# Patient Record
Sex: Female | Born: 1955
Health system: Southern US, Community
[De-identification: ages and names within clinical notes are randomized; demographics above are authoritative.]

## PROBLEM LIST (undated history)

## (undated) DIAGNOSIS — M8580 Other specified disorders of bone density and structure, unspecified site: Secondary | ICD-10-CM

## (undated) DIAGNOSIS — E119 Type 2 diabetes mellitus without complications: Secondary | ICD-10-CM

## (undated) DIAGNOSIS — E079 Disorder of thyroid, unspecified: Secondary | ICD-10-CM

## (undated) DIAGNOSIS — K219 Gastro-esophageal reflux disease without esophagitis: Secondary | ICD-10-CM

## (undated) DIAGNOSIS — I10 Essential (primary) hypertension: Secondary | ICD-10-CM

## (undated) DIAGNOSIS — T7840XA Allergy, unspecified, initial encounter: Secondary | ICD-10-CM

## (undated) DIAGNOSIS — Z860100 Personal history of colon polyps, unspecified: Secondary | ICD-10-CM

## (undated) DIAGNOSIS — D649 Anemia, unspecified: Secondary | ICD-10-CM

## (undated) DIAGNOSIS — M1990 Unspecified osteoarthritis, unspecified site: Secondary | ICD-10-CM

## (undated) DIAGNOSIS — E785 Hyperlipidemia, unspecified: Secondary | ICD-10-CM

## (undated) DIAGNOSIS — G709 Myoneural disorder, unspecified: Secondary | ICD-10-CM

## (undated) DIAGNOSIS — U071 COVID-19: Secondary | ICD-10-CM

## (undated) DIAGNOSIS — H269 Unspecified cataract: Secondary | ICD-10-CM

## (undated) HISTORY — DX: Hyperlipidemia, unspecified: E78.5

## (undated) HISTORY — DX: Essential (primary) hypertension: I10

## (undated) HISTORY — DX: Anemia, unspecified: D64.9

## (undated) HISTORY — DX: Unspecified cataract: H26.9

## (undated) HISTORY — DX: Myoneural disorder, unspecified: G70.9

## (undated) HISTORY — PX: APPENDECTOMY: SHX54

## (undated) HISTORY — DX: Type 2 diabetes mellitus without complications: E11.9

## (undated) HISTORY — PX: TONSILLECTOMY: SUR1361

## (undated) HISTORY — DX: Morbid (severe) obesity due to excess calories: E66.01

## (undated) HISTORY — DX: Personal history of colon polyps, unspecified: Z86.0100

## (undated) HISTORY — DX: Disorder of thyroid, unspecified: E07.9

## (undated) HISTORY — DX: COVID-19: U07.1

## (undated) HISTORY — PX: ABDOMINAL HYSTERECTOMY: SHX81

## (undated) HISTORY — DX: Allergy, unspecified, initial encounter: T78.40XA

## (undated) HISTORY — DX: Gastro-esophageal reflux disease without esophagitis: K21.9

## (undated) HISTORY — DX: Unspecified osteoarthritis, unspecified site: M19.90

## (undated) HISTORY — PX: CHOLECYSTECTOMY: SHX55

## (undated) HISTORY — DX: Other specified disorders of bone density and structure, unspecified site: M85.80

## (undated) HISTORY — DX: Personal history of colonic polyps: Z86.010

---

## 1997-11-01 ENCOUNTER — Emergency Department (HOSPITAL_COMMUNITY): Admission: EM | Admit: 1997-11-01 | Discharge: 1997-11-01 | Payer: Self-pay | Admitting: Emergency Medicine

## 2004-02-05 ENCOUNTER — Ambulatory Visit: Payer: Self-pay | Admitting: Family Medicine

## 2004-02-26 ENCOUNTER — Ambulatory Visit (HOSPITAL_COMMUNITY): Admission: RE | Admit: 2004-02-26 | Discharge: 2004-02-26 | Payer: Self-pay | Admitting: Otolaryngology

## 2004-10-06 ENCOUNTER — Ambulatory Visit: Payer: Self-pay | Admitting: Family Medicine

## 2004-10-13 ENCOUNTER — Ambulatory Visit (HOSPITAL_COMMUNITY): Admission: RE | Admit: 2004-10-13 | Discharge: 2004-10-13 | Payer: Self-pay | Admitting: Family Medicine

## 2004-10-20 ENCOUNTER — Ambulatory Visit: Payer: Self-pay | Admitting: *Deleted

## 2004-10-29 ENCOUNTER — Ambulatory Visit: Payer: Self-pay | Admitting: Family Medicine

## 2004-10-29 ENCOUNTER — Encounter (HOSPITAL_COMMUNITY): Admission: RE | Admit: 2004-10-29 | Discharge: 2004-11-28 | Payer: Self-pay | Admitting: *Deleted

## 2004-10-29 ENCOUNTER — Ambulatory Visit: Payer: Self-pay | Admitting: *Deleted

## 2004-11-10 ENCOUNTER — Ambulatory Visit: Payer: Self-pay | Admitting: *Deleted

## 2004-11-16 ENCOUNTER — Encounter (HOSPITAL_COMMUNITY): Admission: RE | Admit: 2004-11-16 | Discharge: 2004-12-16 | Payer: Self-pay | Admitting: Internal Medicine

## 2004-12-20 ENCOUNTER — Emergency Department (HOSPITAL_COMMUNITY): Admission: EM | Admit: 2004-12-20 | Discharge: 2004-12-20 | Payer: Self-pay | Admitting: Emergency Medicine

## 2005-03-02 ENCOUNTER — Ambulatory Visit: Payer: Self-pay | Admitting: Family Medicine

## 2005-03-15 ENCOUNTER — Ambulatory Visit: Payer: Self-pay | Admitting: Family Medicine

## 2005-05-26 ENCOUNTER — Encounter: Payer: Self-pay | Admitting: Emergency Medicine

## 2005-08-31 ENCOUNTER — Ambulatory Visit: Payer: Self-pay | Admitting: Family Medicine

## 2005-09-10 ENCOUNTER — Ambulatory Visit (HOSPITAL_COMMUNITY): Admission: RE | Admit: 2005-09-10 | Discharge: 2005-09-10 | Payer: Self-pay | Admitting: Family Medicine

## 2005-10-04 ENCOUNTER — Encounter: Admission: RE | Admit: 2005-10-04 | Discharge: 2005-10-04 | Payer: Self-pay | Admitting: Family Medicine

## 2005-11-15 ENCOUNTER — Encounter: Admission: RE | Admit: 2005-11-15 | Discharge: 2005-11-15 | Payer: Self-pay | Admitting: Family Medicine

## 2006-01-07 ENCOUNTER — Ambulatory Visit: Payer: Self-pay | Admitting: Family Medicine

## 2006-02-24 ENCOUNTER — Ambulatory Visit: Payer: Self-pay | Admitting: Family Medicine

## 2006-02-25 ENCOUNTER — Encounter: Payer: Self-pay | Admitting: Family Medicine

## 2006-02-25 LAB — CONVERTED CEMR LAB
Bilirubin Urine: NEGATIVE
Ketones, ur: NEGATIVE mg/dL
Nitrite: NEGATIVE
Specific Gravity, Urine: 1.011 (ref 1.005–1.03)
Urobilinogen, UA: 1 (ref 0.0–1.0)
pH: 6 (ref 5.0–8.0)

## 2006-04-25 ENCOUNTER — Ambulatory Visit: Payer: Self-pay | Admitting: Family Medicine

## 2006-06-21 ENCOUNTER — Encounter: Payer: Self-pay | Admitting: Family Medicine

## 2006-06-21 ENCOUNTER — Ambulatory Visit: Payer: Self-pay | Admitting: Family Medicine

## 2006-06-21 ENCOUNTER — Other Ambulatory Visit: Admission: RE | Admit: 2006-06-21 | Discharge: 2006-06-21 | Payer: Self-pay | Admitting: Family Medicine

## 2006-06-21 ENCOUNTER — Encounter (INDEPENDENT_AMBULATORY_CARE_PROVIDER_SITE_OTHER): Payer: Self-pay | Admitting: *Deleted

## 2006-06-21 LAB — CONVERTED CEMR LAB: Pap Smear: NORMAL

## 2006-08-29 ENCOUNTER — Ambulatory Visit: Payer: Self-pay | Admitting: Family Medicine

## 2006-12-16 ENCOUNTER — Ambulatory Visit: Payer: Self-pay | Admitting: Family Medicine

## 2007-01-26 ENCOUNTER — Encounter: Payer: Self-pay | Admitting: Family Medicine

## 2007-01-26 HISTORY — PX: COLONOSCOPY: SHX174

## 2007-01-27 ENCOUNTER — Encounter: Payer: Self-pay | Admitting: Family Medicine

## 2007-06-06 ENCOUNTER — Ambulatory Visit: Payer: Self-pay | Admitting: Family Medicine

## 2007-06-08 ENCOUNTER — Encounter (INDEPENDENT_AMBULATORY_CARE_PROVIDER_SITE_OTHER): Payer: Self-pay | Admitting: *Deleted

## 2007-06-08 DIAGNOSIS — M5410 Radiculopathy, site unspecified: Secondary | ICD-10-CM | POA: Insufficient documentation

## 2007-06-08 DIAGNOSIS — I10 Essential (primary) hypertension: Secondary | ICD-10-CM | POA: Insufficient documentation

## 2007-06-08 DIAGNOSIS — E785 Hyperlipidemia, unspecified: Secondary | ICD-10-CM | POA: Insufficient documentation

## 2007-06-08 DIAGNOSIS — E039 Hypothyroidism, unspecified: Secondary | ICD-10-CM | POA: Insufficient documentation

## 2007-06-08 DIAGNOSIS — F418 Other specified anxiety disorders: Secondary | ICD-10-CM | POA: Insufficient documentation

## 2007-06-08 DIAGNOSIS — K219 Gastro-esophageal reflux disease without esophagitis: Secondary | ICD-10-CM | POA: Insufficient documentation

## 2007-06-08 DIAGNOSIS — E739 Lactose intolerance, unspecified: Secondary | ICD-10-CM | POA: Insufficient documentation

## 2007-06-08 DIAGNOSIS — F411 Generalized anxiety disorder: Secondary | ICD-10-CM | POA: Insufficient documentation

## 2007-06-14 ENCOUNTER — Ambulatory Visit (HOSPITAL_COMMUNITY): Admission: RE | Admit: 2007-06-14 | Discharge: 2007-06-14 | Payer: Self-pay | Admitting: Family Medicine

## 2007-10-03 ENCOUNTER — Telehealth: Payer: Self-pay | Admitting: Family Medicine

## 2007-10-06 ENCOUNTER — Encounter: Payer: Self-pay | Admitting: Family Medicine

## 2007-10-10 ENCOUNTER — Encounter: Payer: Self-pay | Admitting: Family Medicine

## 2007-10-20 ENCOUNTER — Encounter: Payer: Self-pay | Admitting: Family Medicine

## 2007-10-23 ENCOUNTER — Encounter: Payer: Self-pay | Admitting: Family Medicine

## 2007-11-08 ENCOUNTER — Encounter: Payer: Self-pay | Admitting: Family Medicine

## 2007-11-30 ENCOUNTER — Telehealth: Payer: Self-pay | Admitting: Family Medicine

## 2007-12-05 ENCOUNTER — Ambulatory Visit: Payer: Self-pay | Admitting: Family Medicine

## 2007-12-13 ENCOUNTER — Telehealth: Payer: Self-pay | Admitting: Family Medicine

## 2007-12-14 ENCOUNTER — Encounter: Payer: Self-pay | Admitting: Family Medicine

## 2007-12-18 ENCOUNTER — Ambulatory Visit (HOSPITAL_COMMUNITY): Admission: RE | Admit: 2007-12-18 | Discharge: 2007-12-18 | Payer: Self-pay | Admitting: Internal Medicine

## 2007-12-18 ENCOUNTER — Ambulatory Visit: Payer: Self-pay | Admitting: Internal Medicine

## 2007-12-18 ENCOUNTER — Encounter: Payer: Self-pay | Admitting: Family Medicine

## 2007-12-29 ENCOUNTER — Encounter: Payer: Self-pay | Admitting: Family Medicine

## 2008-01-04 ENCOUNTER — Ambulatory Visit: Payer: Self-pay | Admitting: Family Medicine

## 2008-01-04 DIAGNOSIS — R51 Headache: Secondary | ICD-10-CM | POA: Insufficient documentation

## 2008-01-04 DIAGNOSIS — R519 Headache, unspecified: Secondary | ICD-10-CM | POA: Insufficient documentation

## 2008-01-22 ENCOUNTER — Encounter: Payer: Self-pay | Admitting: Family Medicine

## 2008-02-09 ENCOUNTER — Encounter: Payer: Self-pay | Admitting: Family Medicine

## 2008-03-11 ENCOUNTER — Encounter: Payer: Self-pay | Admitting: Family Medicine

## 2008-04-02 ENCOUNTER — Ambulatory Visit: Payer: Self-pay | Admitting: Family Medicine

## 2008-04-02 LAB — CONVERTED CEMR LAB: Glucose, Bld: 109 mg/dL

## 2008-05-08 ENCOUNTER — Encounter: Payer: Self-pay | Admitting: Family Medicine

## 2008-08-07 ENCOUNTER — Ambulatory Visit: Payer: Self-pay | Admitting: Family Medicine

## 2008-08-19 ENCOUNTER — Ambulatory Visit (HOSPITAL_COMMUNITY): Admission: RE | Admit: 2008-08-19 | Discharge: 2008-08-19 | Payer: Self-pay | Admitting: Family Medicine

## 2008-12-03 ENCOUNTER — Ambulatory Visit: Payer: Self-pay | Admitting: Family Medicine

## 2008-12-03 ENCOUNTER — Encounter: Payer: Self-pay | Admitting: Family Medicine

## 2008-12-03 ENCOUNTER — Other Ambulatory Visit: Admission: RE | Admit: 2008-12-03 | Discharge: 2008-12-03 | Payer: Self-pay | Admitting: Family Medicine

## 2008-12-03 LAB — CONVERTED CEMR LAB: Glucose, Bld: 128 mg/dL

## 2008-12-04 ENCOUNTER — Encounter: Payer: Self-pay | Admitting: Family Medicine

## 2008-12-05 ENCOUNTER — Encounter: Payer: Self-pay | Admitting: Family Medicine

## 2009-01-25 DIAGNOSIS — E119 Type 2 diabetes mellitus without complications: Secondary | ICD-10-CM

## 2009-01-25 DIAGNOSIS — I10 Essential (primary) hypertension: Secondary | ICD-10-CM

## 2009-01-25 HISTORY — DX: Type 2 diabetes mellitus without complications: E11.9

## 2009-01-25 HISTORY — DX: Essential (primary) hypertension: I10

## 2009-01-28 ENCOUNTER — Ambulatory Visit: Payer: Self-pay | Admitting: Family Medicine

## 2009-02-14 ENCOUNTER — Encounter: Payer: Self-pay | Admitting: Family Medicine

## 2009-04-08 ENCOUNTER — Encounter: Payer: Self-pay | Admitting: Family Medicine

## 2009-06-10 ENCOUNTER — Encounter: Payer: Self-pay | Admitting: Family Medicine

## 2009-07-04 ENCOUNTER — Encounter: Payer: Self-pay | Admitting: Family Medicine

## 2009-07-29 ENCOUNTER — Ambulatory Visit: Payer: Self-pay | Admitting: Family Medicine

## 2009-08-18 ENCOUNTER — Ambulatory Visit: Payer: Self-pay | Admitting: Family Medicine

## 2009-08-18 DIAGNOSIS — L299 Pruritus, unspecified: Secondary | ICD-10-CM | POA: Insufficient documentation

## 2009-08-20 ENCOUNTER — Encounter: Payer: Self-pay | Admitting: Physician Assistant

## 2009-08-20 ENCOUNTER — Telehealth: Payer: Self-pay | Admitting: Physician Assistant

## 2009-09-16 ENCOUNTER — Telehealth: Payer: Self-pay | Admitting: Family Medicine

## 2009-09-17 ENCOUNTER — Encounter: Payer: Self-pay | Admitting: Family Medicine

## 2009-09-18 ENCOUNTER — Ambulatory Visit (HOSPITAL_COMMUNITY): Admission: RE | Admit: 2009-09-18 | Discharge: 2009-09-18 | Payer: Self-pay | Admitting: Family Medicine

## 2009-09-23 LAB — CONVERTED CEMR LAB
ALT: 29 units/L (ref 0–35)
AST: 25 units/L (ref 0–37)
Albumin: 4.2 g/dL (ref 3.5–5.2)
Basophils Absolute: 0.1 10*3/uL (ref 0.0–0.1)
Basophils Relative: 1 % (ref 0–1)
Bilirubin, Direct: 0.1 mg/dL (ref 0.0–0.3)
Calcium: 9.5 mg/dL (ref 8.4–10.5)
Cholesterol: 254 mg/dL — ABNORMAL HIGH (ref 0–200)
Eosinophils Absolute: 0.3 10*3/uL (ref 0.0–0.7)
Eosinophils Relative: 3 % (ref 0–5)
HCT: 44.5 % (ref 36.0–46.0)
HDL: 41 mg/dL (ref >39)
MCV: 94.7 fL (ref 78.0–100.0)
Neutrophils Relative %: 53 % (ref 43–77)
Platelets: 418 10*3/uL — ABNORMAL HIGH (ref 150–400)
Potassium: 4.6 meq/L (ref 3.5–5.3)
RDW: 14.8 % (ref 11.5–15.5)
Sodium: 137 meq/L (ref 135–145)
TSH: 5.203 microintl units/mL — ABNORMAL HIGH (ref 0.350–4.500)
Total CHOL/HDL Ratio: 6.2
Total Protein: 7 g/dL (ref 6.0–8.3)
Triglycerides: 175 mg/dL — ABNORMAL HIGH (ref <150)

## 2009-11-07 ENCOUNTER — Encounter: Payer: Self-pay | Admitting: Family Medicine

## 2009-12-12 ENCOUNTER — Encounter: Payer: Self-pay | Admitting: Family Medicine

## 2010-01-15 ENCOUNTER — Encounter: Payer: Self-pay | Admitting: Family Medicine

## 2010-01-16 LAB — CONVERTED CEMR LAB
ALT: 22 units/L (ref 0–35)
BUN: 14 mg/dL (ref 6–23)
Bilirubin, Direct: 0.1 mg/dL (ref 0.0–0.3)
CO2: 29 meq/L (ref 19–32)
Cholesterol: 249 mg/dL — ABNORMAL HIGH (ref 0–200)
Eosinophils Absolute: 0.4 10*3/uL (ref 0.0–0.7)
Eosinophils Relative: 5 % (ref 0–5)
Glucose, Bld: 124 mg/dL — ABNORMAL HIGH (ref 70–99)
Hemoglobin: 13.9 g/dL (ref 12.0–15.0)
Hgb A1c MFr Bld: 6.9 % — ABNORMAL HIGH (ref <5.7)
Indirect Bilirubin: 0.3 mg/dL (ref 0.0–0.9)
Lymphocytes Relative: 47 % — ABNORMAL HIGH (ref 12–46)
Lymphs Abs: 4.1 10*3/uL — ABNORMAL HIGH (ref 0.7–4.0)
MCHC: 32.2 g/dL (ref 30.0–36.0)
Monocytes Relative: 6 % (ref 3–12)
Neutro Abs: 3.6 10*3/uL (ref 1.7–7.7)
Platelets: 396 10*3/uL (ref 150–400)
Potassium: 4.9 meq/L (ref 3.5–5.3)
RDW: 13.7 % (ref 11.5–15.5)
Sodium: 140 meq/L (ref 135–145)
TSH: 4.408 microintl units/mL (ref 0.350–4.500)
Total Bilirubin: 0.4 mg/dL (ref 0.3–1.2)
Total CHOL/HDL Ratio: 5.2
VLDL: 35 mg/dL (ref 0–40)

## 2010-01-22 ENCOUNTER — Ambulatory Visit: Payer: Self-pay | Admitting: Family Medicine

## 2010-01-22 DIAGNOSIS — E1165 Type 2 diabetes mellitus with hyperglycemia: Secondary | ICD-10-CM | POA: Insufficient documentation

## 2010-01-22 DIAGNOSIS — E1040 Type 1 diabetes mellitus with diabetic neuropathy, unspecified: Secondary | ICD-10-CM

## 2010-02-14 ENCOUNTER — Encounter: Payer: Self-pay | Admitting: Family Medicine

## 2010-02-15 ENCOUNTER — Encounter: Payer: Self-pay | Admitting: Family Medicine

## 2010-02-22 LAB — CONVERTED CEMR LAB: Hgb A1c MFr Bld: 6.5 %

## 2010-02-24 NOTE — Letter (Signed)
Summary: misc  misc   Imported By: Lind Guest 06/19/2009 08:03:35  _____________________________________________________________________  External Attachment:    Type:   Image     Comment:   External Document

## 2010-02-24 NOTE — Letter (Signed)
Summary: rx assistance   rx assistance   Imported By: Lind Guest 12/12/2009 08:43:13  _____________________________________________________________________  External Attachment:    Type:   Image     Comment:   External Document

## 2010-02-24 NOTE — Letter (Signed)
Summary: rx assistance  rx assistance   Imported By: Lind Guest 02/14/2009 10:02:06  _____________________________________________________________________  External Attachment:    Type:   Image     Comment:   External Document

## 2010-02-24 NOTE — Letter (Signed)
Summary: consults  consults   Imported By: Lind Guest 06/19/2009 08:01:36  _____________________________________________________________________  External Attachment:    Type:   Image     Comment:   External Document

## 2010-02-24 NOTE — Letter (Signed)
Summary: PATIENT ASSISTANCE RX  PATIENT ASSISTANCE RX   Imported By: Lind Guest 06/10/2009 10:27:17  _____________________________________________________________________  External Attachment:    Type:   Image     Comment:   External Document

## 2010-02-24 NOTE — Letter (Signed)
Summary: x rays  x rays   Imported By: Lind Guest 06/19/2009 08:05:21  _____________________________________________________________________  External Attachment:    Type:   Image     Comment:   External Document

## 2010-02-24 NOTE — Progress Notes (Signed)
Summary: notes  Phone Note Call from Patient   Summary of Call: needs a doctors note to be out the whole week. pt saw Pola Furno on Monday. [phone] Initial call taken by: Rudene Anda,  August 20, 2009 8:06 AM  Follow-up for Phone Call        Correct ph # is [phone]. Pt feels that she is unable to work this week.  She is itching again this afternoon.  states the med helps but makes her drowsy.  Pt states she is hopeful the itching will be better next week. Follow-up by: Esperanza Sheets PA,  August 20, 2009 4:26 PM  Additional Follow-up for Phone Call Additional follow up Details #1::        Discussed with Dr Lodema Hong. OK work note for this week. Additional Follow-up by: Esperanza Sheets PA,  August 20, 2009 4:30 PM    Additional Follow-up for Phone Call Additional follow up Details #2::    patient aware Follow-up by: Adella Hare LPN,  August 20, 2009 4:45 PM

## 2010-02-24 NOTE — Assessment & Plan Note (Signed)
Summary: OV   Vital Signs:  Patient profile:   55 year old female Menstrual status:  hysterectomy Height:      67.5 inches Weight:      309.25 pounds BMI:     47.89 O2 Sat:      97 % Pulse rate:   80 / minute Pulse rhythm:   regular Resp:     16 per minute BP sitting:   114 / 80 Cuff size:   regular  Vitals Entered By: Everitt Amber (January 28, 2009 2:21 PM)  Nutrition Counseling: Patient's BMI is greater than 25 and therefore counseled on weight management options. CC: Follow up chronic problems, back and legs still hurting her. Got to where she couldn't walk the other night, her right leg kept locking up   Primary Care Provider:  Syliva Overman MD  CC:  Follow up chronic problems, back and legs still hurting her. Got to where she couldn't walk the other night, and her right leg kept locking up.  History of Present Illness: Larey Seat off ladder approx 6 weeks ago, experienced severe back pain, she had severe spasm 4 days ago, while in her house, could not move for 15 mins , she reports progressive weakness of the right leg and numbness, no incontinence noted. She feels as though her back problems are worsening, and really needs further evaluation, she is awaiting help with her medical care before she can move forward on this. She otherwise has no new complaints. She does however state that her depression is worsening because she is unable to afford the med and has received no medication to date.She is not suicidal or homicidal and is trying to find a job.  Allergies: 1)  ! * Anaprox  Review of Systems      See HPI ENT:  Denies hoarseness, nasal congestion, sinus pressure, and sore throat. Resp:  Denies cough and sputum productive. GU:  Denies dysuria, incontinence, urinary frequency, and urinary hesitancy. MS:  See HPI. Neuro:  Complains of numbness and weakness; progressive unilateral lower extremity numbness and weakness. Psych:  See HPI; Complains of anxiety and depression;  denies easily tearful, irritability, mental problems, panic attacks, suicidal thoughts/plans, thoughts of violence, and unusual visions or sounds.  Physical Exam  General:  Well-developed,obese,in no acute distress; alert,appropriate and cooperative throughout examination HEENT: No facial asymmetry,  EOMI, No sinus tenderness, TM's Clear, oropharynx  pink and moist.   Chest: Clear to auscultation bilaterally.  CVS: S1, S2, No murmurs, No S3.   Abd: Soft, Nontender.  ZO:[id number] reduced  ROM spine, with abnormal gait.  Ext: No edema.   CNS: CN 2-12 intact,reduced  power tone and sensation  in right lower extremity  Skin: Intact, no visible lesions or rashes.  Psych: Good eye contact, normal affect.  Memory intact, not anxious or depressed appearing.    Impression & Recommendations:  Problem # 1:  BACK PAIN (ICD-724.5) Assessment Deteriorated  Her updated medication list for this problem includes:    Aspirin 81 Mg Tbec (Aspirin) ..... One tab by mouth once daily    Hydrocodone-acetaminophen 5-500 Mg Tabs (Hydrocodone-acetaminophen) ..... One tab by mouth at bedtime as needed  Orders: Depo- Medrol 80mg  (J1040) Ketorolac-Toradol 15mg  (U9811) Admin of Therapeutic Inj  intramuscular or subcutaneous (91478)  Problem # 2:  DEPRESSION (ICD-311) Assessment: Deteriorated  Her updated medication list for this problem includes:    Alprazolam 0.25 Mg Tabs (Alprazolam) ..... One tab by mouth once daily as needed  Problem #  3:  OBESITY (ICD-278.00) Assessment: Unchanged  Ht: 67.5 (01/28/2009)   Wt: 309.25 (01/28/2009)   BMI: 47.89 (01/28/2009)  Problem # 4:  HYPERTENSION (ICD-401.9) Assessment: Unchanged  Her updated medication list for this problem includes:    Benazepril-hydrochlorothiazide 20-12.5 Mg Tabs (Benazepril-hydrochlorothiazide) ..... One tab by mouth once daily  BP today: 114/80 Prior BP: 120/82 (12/03/2008)  Problem # 5:  HYPOTHYROIDISM (ICD-244.9) Assessment:  Comment Only  Her updated medication list for this problem includes:    Synthroid 150 Mcg Tabs (Levothyroxine sodium) ..... One tab po qd  Labs Reviewed: HgBA1c: 6.2 (04/02/2008)  Problem # 6:  HYPERLIPIDEMIA (ICD-272.4)  Complete Medication List: 1)  Benazepril-hydrochlorothiazide 20-12.5 Mg Tabs (Benazepril-hydrochlorothiazide) .... One tab by mouth once daily 2)  Klor-con M20 20 Meq Tbcr (Potassium chloride crys cr) .... One tab by mouth once daily 3)  Aspirin 81 Mg Tbec (Aspirin) .... One tab by mouth once daily 4)  Prevacid 30 Mg Cpdr (Lansoprazole) .... One tab by mouth once daily 5)  Alprazolam 0.25 Mg Tabs (Alprazolam) .... One tab by mouth once daily as needed 6)  Hydrocodone-acetaminophen 5-500 Mg Tabs (Hydrocodone-acetaminophen) .... One tab by mouth at bedtime as needed 7)  Synthroid 150 Mcg Tabs (Levothyroxine sodium) .... One tab po qd 8)  Oscal 500/200 D-3 500-200 Mg-unit Tabs (Calcium-vitamin d) .... Take 1 tablet by mouth three times a day  Patient Instructions: 1)  F/U as before. 2)  I believe that your back problem is much worse. 3)  You need an mRI of the low back since you are experiencing weakness and numbness of the right leg, pls try and get this asap. You report near falls because of right leg weakness. 4)  Injections today and meds to your pharmacy. Prescriptions: PREDNISONE (PAK) 10 MG TABS (PREDNISONE) Use as directed  #21 x 0   Entered and Authorized by:   Syliva Overman MD   Signed by:   Syliva Overman MD on 01/28/2009   Method used:   Electronically to        [facility] [address] (retail)       [address]       [place], [place]  [address]       Ph: [phone] or [phone]       Fax: [phone]   RxID:   [id number]    Medication Administration  Injection # 1:    Medication: Depo- Medrol 80mg     Diagnosis: BACK PAIN (ICD-724.5)    Route: IM    Site: RUOQ gluteus    Exp Date: 8/11    Lot #:  [id number]    Mfr: novaplus    Patient tolerated injection without complications    Given by: Worthy Keeler LPN (January 28, 2009 3:27 PM)  Injection # 2:    Medication: Ketorolac-Toradol 15mg     Diagnosis: BACK PAIN (ICD-724.5)    Route: IM    Site: LUOQ gluteus    Exp Date: 05/26/2010    Lot #: [id number]    Mfr: novaplus    Comments: toradol 60mg  given from two 30mg  vials    Patient tolerated injection without complications    Given by: Worthy Keeler LPN (January 28, 2009 3:28 PM)  Orders Added: 1)  Est. Patient Level IV [44034] 2)  Depo- Medrol 80mg  [J1040] 3)  Ketorolac-Toradol 15mg  [J1885] 4)  Admin of Therapeutic Inj  intramuscular or subcutaneous [74259]

## 2010-02-24 NOTE — Letter (Signed)
Summary: PATIENT ASSISTANCE RX  PATIENT ASSISTANCE RX   Imported By: Lind Guest 06/10/2009 09:44:01  _____________________________________________________________________  External Attachment:    Type:   Image     Comment:   External Document

## 2010-02-24 NOTE — Letter (Signed)
Summary: rx assistance  rx assistance   Imported By: Lind Guest 02/14/2009 10:00:03  _____________________________________________________________________  External Attachment:    Type:   Image     Comment:   External Document

## 2010-02-24 NOTE — Letter (Signed)
Summary: RX ASSISTANCE  RX ASSISTANCE   Imported By: Lind Guest 11/07/2009 08:53:29  _____________________________________________________________________  External Attachment:    Type:   Image     Comment:   External Document

## 2010-02-24 NOTE — Assessment & Plan Note (Signed)
Summary: office visit   Vital Signs:  Patient profile:   55 year old female Menstrual status:  hysterectomy Height:      67.5 inches Weight:      309.08 pounds BMI:     47.87 Pulse rate:   72 / minute Pulse rhythm:   regular BP sitting:   130 / 84  (right arm)  Nutrition Counseling: Patient's BMI is greater than 25 and therefore counseled on weight management options.  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 she has not had any anti depressant meds for months, and states nw she is off to work she does not feel she absolutely needs them.  Denies recent fever or chills. Denies sinus pressure, nasal congestion , ear pain or sore throat. Denies chest congestion, or cough productive of sputum. Denies chest pain, palpitations, PND, orthopnea or leg swelling. Denies abdominal pain, nausea, vomitting, diarrhea or constipation. Denies change in bowel movements or bloody stool. Denies dysuria , frequency, incontinence or hesitancy. Pt continues to have chronic back pain Denies headaches, vertigo, seizures.  Denies  rash, lesions, or itch.     Allergies: 1)  ! * Anaprox  Review of Systems      See HPI Eyes:  Denies blurring and discharge. MS:  Complains of low back pain and mid back pain. Psych:  Complains of anxiety, depression, irritability, mental problems, suicidal thoughts/plans, and thoughts of violence; denies unusual visions or sounds; mmarked deterioration in her mental health in the last 6 months, primarily related to having no job or money, the great news is that tomorrow she starts  work,. intolerant of prozac, doubts she will have a need for meds now. Endo:  Denies  cold intolerance, excessive hunger, excessive thirst, excessive urination, heat intolerance, polyuria, and weight change. Heme:  Denies abnormal bruising and bleeding. Allergy:  Complains of seasonal allergies; denies hives or rash and itching eyes.  Physical Exam  General:  Well-developed,obese,in no acute distress; alert,appropriate and cooperative throughout examination HEENT: No facial asymmetry,  EOMI, No sinus tenderness, TM's Clear, oropharynx  pink and moist.   Chest: Clear to auscultation bilaterally.  CVS: S1, S2, No murmurs, No S3.   Abd: Soft, Nontender.  OZ:HYQMVHQI reduced  ROM spine, with abnormal gait.  Ext: No edema.   CNS: CN 2-12 intact,reduced  power tone and sensation  in right lower extremity  Skin: Intact, no visible lesions or rashes.  Psych: Good eye contact, normal affect.  Memory intact, not anxious or depressed appearing.    Impression & Recommendations:  Problem # 1:  BACK PAIN (ICD-724.5) Assessment Improved  The following medications were removed from the medication list:    Hydrocodone-acetaminophen 5-500 Mg Tabs (Hydrocodone-acetaminophen) ..... One tab by mouth at bedtime as needed Her updated medication list for this problem includes:    Aspirin 81 Mg Tbec (Aspirin) ..... One tab by mouth once daily  Problem # 2:  DEPRESSION (ICD-311) Assessment: Deteriorated  Her updated medication list for this problem includes:    Alprazolam 0.25 Mg Tabs (Alprazolam) ..... One tab by mouth once daily as needed  Discussed treatment options, including trial of antidpressant medication. Will refer to  behavioral health. Follow-up call in in 24-48 hours and recheck in 2 weeks, sooner as needed. Patient agrees to call if any worsening of symptoms or thoughts of doing harm arise. Verified that the patient has no suicidal ideation at this time.   Problem # 3:  HYPERLIPIDEMIA (ICD-272.4) Assessment: Comment Only  Orders: T-Lipid Profile ([id number]) T-Hepatic  Function [phone]) labs past due , low fat diet discussed and encouraged  Problem # 4:  HYPOTHYROIDISM (ICD-244.9) Assessment: Comment Only  Her updated medication list for this problem includes:    Synthroid 150 Mcg Tabs (Levothyroxine sodium) ..... One tab po qd  Orders: T-TSH ([id number])  Labs Reviewed: HgBA1c: 6.2 (04/02/2008)  Problem # 5:  HYPERTENSION (ICD-401.9) Assessment: Unchanged  Her updated medication list for this problem includes:    Benazepril-hydrochlorothiazide 20-12.5 Mg Tabs (Benazepril-hydrochlorothiazide) ..... One tab by mouth once daily  Orders: T-Basic Metabolic Panel [id number])  BP today: 130/84 Prior BP: 114/80 (01/28/2009)  Complete Medication List: 1)  Benazepril-hydrochlorothiazide 20-12.5 Mg Tabs (Benazepril-hydrochlorothiazide) .... One tab by mouth once daily 2)  Klor-con M20 20 Meq Tbcr (Potassium chloride crys cr) .... One tab by mouth once daily 3)  Aspirin 81 Mg Tbec (Aspirin) .... One tab by mouth once daily 4)  Alprazolam 0.25 Mg Tabs (Alprazolam) .... One tab by mouth once daily as needed 5)  Synthroid 150 Mcg Tabs (Levothyroxine sodium) .... One tab po qd 6)  Oscal 500/200 D-3 500-200 Mg-unit Tabs (Calcium-vitamin d) .... Take 1 tablet by mouth three times a day 7)  Dexilant 30 Mg Cpdr (Dexlansoprazole) .... One cap by mouth qd  Other Orders: T-CBC w/Diff ([id number]) T- Hemoglobin A1C ([id number])  Patient Instructions: 1)  F/U in 5.5 months 2)  BMP prior to visit, ICD-9: 3)  Lipid Panel prior to visit, ICD-9: 4)  TSH prior to visit, ICD-9:   fasting asap 5)  CBC w/ Diff prior to visit, ICD-9: 6)  HbgA1C prior to visit, ICD-9: 7)  It is important that you exercise regularly at least 20 minutes 5 times a week. If you develop chest pain, have severe difficulty breathing, or feel very tired , stop exercising immediately and seek medical attention. 8)  You need to lose weight. Consider a lower calorie diet and regular  exercise.  9)  CONGRATS on your job and all the best. 10)  [place] due end July, 2011, pls sched 11)  No med changes at this time

## 2010-02-24 NOTE — Letter (Signed)
Summary: rx assistance  rx assistance   Imported By: Lind Guest 02/14/2009 09:58:52  _____________________________________________________________________  External Attachment:    Type:   Image     Comment:   External Document

## 2010-02-24 NOTE — Letter (Signed)
Summary: labs  labs   Imported By: Lind Guest 06/19/2009 08:03:03  _____________________________________________________________________  External Attachment:    Type:   Image     Comment:   External Document

## 2010-02-24 NOTE — Letter (Signed)
Summary: office notes  office notes   Imported By: Lind Guest 06/19/2009 08:04:11  _____________________________________________________________________  External Attachment:    Type:   Image     Comment:   External Document

## 2010-02-24 NOTE — Letter (Signed)
Summary: rx assistance  rx assistance   Imported By: Lind Guest 02/14/2009 09:59:13  _____________________________________________________________________  External Attachment:    Type:   Image     Comment:   External Document

## 2010-02-24 NOTE — Letter (Signed)
Summary: phone notes  phone notes   Imported By: Lind Guest 06/19/2009 08:04:48  _____________________________________________________________________  External Attachment:    Type:   Image     Comment:   External Document

## 2010-02-24 NOTE — Letter (Signed)
Summary: patient assistance rx  patient assistance rx   Imported By: Lind Guest 07/04/2009 14:32:49  _____________________________________________________________________  External Attachment:    Type:   Image     Comment:   External Document

## 2010-02-24 NOTE — Progress Notes (Signed)
Summary: ORDER  Phone Note Call from Patient   Summary of Call: NEEDS A ORDER TO GO DO HER MAMMOGRAM GOING THURSDAY 8.25.11 Initial call taken by: Lind Guest,  September 16, 2009 3:59 PM  Follow-up for Phone Call        OK to order screening mammogram. Follow-up by: Esperanza Sheets PA,  September 16, 2009 4:22 PM  Additional Follow-up for Phone Call Additional follow up Details #1::        i cannot order this test Additional Follow-up by: Adella Hare LPN,  September 16, 2009 5:02 PM    Additional Follow-up for Phone Call Additional follow up Details #2::    sorry, thought you could. I have ordered it. Follow-up by: Esperanza Sheets PA,  September 17, 2009 8:11 AM  Additional Follow-up for Phone Call Additional follow up Details #3:: Details for Additional Follow-up Action Taken: order sent to referals Additional Follow-up by: Adella Hare LPN,  September 17, 2009 10:02 AM

## 2010-02-24 NOTE — Letter (Signed)
Summary: rx assistance  rx assistance   Imported By: Lind Guest 02/14/2009 09:59:38  _____________________________________________________________________  External Attachment:    Type:   Image     Comment:   External Document

## 2010-02-24 NOTE — Letter (Signed)
Summary: demo  demo   Imported By: Lind Guest 06/19/2009 08:02:03  _____________________________________________________________________  External Attachment:    Type:   Image     Comment:   External Document

## 2010-02-24 NOTE — Letter (Signed)
Summary: PATIENT ASSISTANCE RX  PATIENT ASSISTANCE RX   Imported By: Lind Guest 04/08/2009 10:04:40  _____________________________________________________________________  External Attachment:    Type:   Image     Comment:   External Document

## 2010-02-24 NOTE — Letter (Signed)
Summary: history and physical  history and physical   Imported By: Lind Guest 06/19/2009 08:02:35  _____________________________________________________________________  External Attachment:    Type:   Image     Comment:   External Document

## 2010-02-24 NOTE — Assessment & Plan Note (Signed)
Summary: nerves/itching- room 2   Vital Signs:  Patient profile:   55 year old female Menstrual status:  hysterectomy Height:      67.5 inches Weight:      311.25 pounds BMI:     48.20 O2 Sat:      94 % on Room air Pulse rate:   95 / minute Resp:     16 per minute BP sitting:   108 / 80  (left arm)  Vitals Entered By: Adella Hare LPN (August 18, 2009 4:34 PM) CC: nerves- itching Is Patient Diabetic? No Pain Assessment Patient in pain? no      Comments did not bring meds to ov   Primary Provider:  Syliva Overman MD  CC:  nerves- itching.  History of Present Illness: Pt presents today with c/o anxiety causing her to itch all over.  No rash.  No change soaps, laundry detergents, etc.  No new meds or recent abx. She states she has a hx of this.  Has been prescribed med in past that helped.  She states she just started a new job & this is what has her anxiety and itching flared up.  She denies any allergy syptoms such as sneezing, itchy watery eyes, or hives.  Pt was just seen for f/u of her chronic medical problems this mos.  No change in meds, etc.  Allergies (verified): 1)  ! * Anaprox  Past History:  Past medical history reviewed for relevance to current acute and chronic problems.  Past Medical History: Reviewed history from 06/08/2007 and no changes required. Current Problems:  GLUCOSE INTOLERANCE (ICD-271.3) BACK PAIN (ICD-724.5) GERD (ICD-530.81) ANXIETY (ICD-300.00) DEPRESSION (ICD-311) OBESITY (ICD-278.00) HYPOTHYROIDISM (ICD-244.9) HYPERLIPIDEMIA (ICD-272.4) HYPERTENSION (ICD-401.9)  Review of Systems General:  Denies chills and fever. ENT:  Denies earache, nasal congestion, and sore throat. CV:  Denies chest pain or discomfort and palpitations. Resp:  Denies shortness of breath and wheezing. Derm:  Complains of itching; denies lesion(s) and rash. Psych:  Complains of anxiety and depression; denies suicidal thoughts/plans and thoughts /plans of  harming others. Allergy:  Denies hives or rash, itching eyes, and sneezing.  Physical Exam  General:  Well-developed,well-nourished,in no acute distress; alert,appropriate and cooperative throughout examination Head:  Normocephalic and atraumatic without obvious abnormalities. No apparent alopecia or balding. Ears:  External ear exam shows no significant lesions or deformities.  Otoscopic examination reveals clear canals, tympanic membranes are intact bilaterally without bulging, retraction, inflammation or discharge. Hearing is grossly normal bilaterally. Nose:  External nasal examination shows no deformity or inflammation. Nasal mucosa are pink and moist without lesions or exudates. Mouth:  Oral mucosa and oropharynx without lesions or exudates.  Neck:  No deformities, masses, or tenderness noted. Lungs:  Normal respiratory effort, chest expands symmetrically. Lungs are clear to auscultation, no crackles or wheezes. Heart:  Normal rate and regular rhythm. S1 and S2 normal without gallop, murmur, click, rub or other extra sounds. Skin:  Intact without suspicious lesions or rashes. Areas of excoriations noted shoulder area bilat, and bilat dorsal upper extremities. Cervical Nodes:  No lymphadenopathy noted Psych:  Cognition and judgment appear intact. Alert and cooperative with normal attention span and concentration. No apparent delusions, illusions, hallucinations. moderately anxious, wringing hands, and scratching.   Impression & Recommendations:  Problem # 1:  ANXIETY (ICD-300.00) Assessment Deteriorated  Her updated medication list for this problem includes:    Alprazolam 0.25 Mg Tabs (Alprazolam) ..... One tab by mouth once daily as needed    Hydroxyzine  Hcl 25 Mg Tabs (Hydroxyzine hcl) .Marland Kitchen... Take 1 every 8 hrs as needed for anxiety and itching  Problem # 2:  PRURITUS (ICD-698.9) Assessment: Deteriorated  Problem # 3:  HYPERTENSION (ICD-401.9) Assessment: Comment Only  Her  updated medication list for this problem includes:    Benazepril-hydrochlorothiazide 20-12.5 Mg Tabs (Benazepril-hydrochlorothiazide) ..... One tab by mouth once daily  BP today: 108/80 Prior BP: 130/84 (07/29/2009)  Complete Medication List: 1)  Benazepril-hydrochlorothiazide 20-12.5 Mg Tabs (Benazepril-hydrochlorothiazide) .... One tab by mouth once daily 2)  Klor-con M20 20 Meq Tbcr (Potassium chloride crys cr) .... One tab by mouth once daily 3)  Aspirin 81 Mg Tbec (Aspirin) .... One tab by mouth once daily 4)  Alprazolam 0.25 Mg Tabs (Alprazolam) .... One tab by mouth once daily as needed 5)  Synthroid 150 Mcg Tabs (Levothyroxine sodium) .... One tab po qd 6)  Oscal 500/200 D-3 500-200 Mg-unit Tabs (Calcium-vitamin d) .... Take 1 tablet by mouth three times a day 7)  Dexilant 30 Mg Cpdr (Dexlansoprazole) .... One cap by mouth qd 8)  Hydroxyzine Hcl 25 Mg Tabs (Hydroxyzine hcl) .... Take 1 every 8 hrs as needed for anxiety and itching  Patient Instructions: 1)  Please schedule a follow-up appointment as needed.   2)  I have refilled Hydroxyzine to use for your itching.  This may make you drowsy. Prescriptions: HYDROXYZINE HCL 25 MG TABS (HYDROXYZINE HCL) take 1 every 8 hrs as needed for anxiety and itching  #30 x 0   Entered and Authorized by:   Esperanza Sheets PA   Signed by:   Esperanza Sheets PA on 08/18/2009   Method used:   Electronically to        [facility] [address] (retail)       [address]       [place], [place]  [address]       Ph: [phone] or [phone]       Fax: [phone]   RxID:   [id number]

## 2010-02-24 NOTE — Letter (Signed)
Summary: Out of Work  [facility]  [address]   [place], [place] [address]   Phone: [phone]  Fax: [phone]    August 20, 2009   Employee:  [name]    To Whom It May Concern:   For Medical reasons, please excuse the above named employee from work for the following dates:  Start:   08-18-09  End:   08-25-09 may return to work without restriction.  If you need additional information, please feel free to contact our office.         Sincerely,    Esperanza Sheets PA

## 2010-02-26 NOTE — Assessment & Plan Note (Signed)
Summary: F UP   Vital Signs:  Patient profile:   55 year old female Menstrual status:  hysterectomy Height:      67.5 inches Weight:      312.25 pounds BMI:     48.36 O2 Sat:      98 % on Room air Pulse rate:   92 / minute Pulse rhythm:   regular Resp:     16 per minute BP sitting:   100 / 80  (left arm)  Vitals Entered By: Adella Hare LPN (January 22, 2010 8:05 AM)  Nutrition Counseling: Patient's BMI is greater than 25 and therefore counseled on weight management options.  O2 Flow:  Room air CC: follow-up visit Is Patient Diabetic? Yes Comments did not bring meds to ov   Primary Care Provider:  Syliva Overman MD  CC:  follow-up visit.  History of Present Illness: Reports  that she has been doing fairly well Denies recent fever or chills. Denies sinus pressure, nasal congestion , ear pain or sore throat. Denies chest congestion, or cough productive of sputum. Denies chest pain, palpitations, PND, orthopnea or leg swelling. Denies abdominal pain, nausea, vomitting, diarrhea or constipation. Denies change in bowel movements or bloody stool. Denies dysuria , frequency, incontinence or hesitancy.  Denies headaches, vertigo, seizures. Denies depression, anxiety or insomnia. Denies  rash, lesions, or itch. pt is not taking either her diabetic or cholesterol med. she has not been diligent with dietary chqange or exercise ,her weight is unchanged     Allergies (verified): 1)  ! * Anaprox  Review of Systems General:  Complains of chills and fatigue; denies fever; had chills 1 month. ENT:  Complains of nasal congestion; 2 weeks ago symptoms more severe with ear pain , has resolved. MS:  Complains of joint pain, low back pain, mid back pain, and stiffness; 2 months ago pt's grand-daughter fell on her left ankle followed by a 175 pound dog. today noted swollen vein on top of the ankle, concerned about clot . Psych:  Complains of anxiety and depression. Heme:  Denies  abnormal bruising and bleeding. Allergy:  Denies hives or rash and itching eyes.  Physical Exam  General:  Well-developed,obese,in no acute distress; alert,appropriate and cooperative throughout examination HEENT: No facial asymmetry,  EOMI, No sinus tenderness, TM's Clear, oropharynx  pink and moist.   Chest: Clear to auscultation bilaterally.  CVS: S1, S2, No murmurs, No S3.   Abd: Soft, Nontender.  MS: decreased  ROM spine,adequate in  hips, shoulders and knees. right ankle mildly tender on lateral aspect Ext: No edema.   CNS: CN 2-12 intact, power tone and sensation normal throughout.   Skin: Intact, no visible lesions or rashes.  Psych: Good eye contact, normal affect.  Memory intact, not anxious or depressed appearing.    Impression & Recommendations:  Problem # 1:  DIABETES MELLITUS, TYPE II (ICD-250.00) Assessment Deteriorated  The following medications were removed from the medication list:    Metformin Hcl 500 Mg Tabs (Metformin hcl) .Marland Kitchen... Take 1 tablet by mouth two times a day Her updated medication list for this problem includes:    Benazepril-hydrochlorothiazide 20-12.5 Mg Tabs (Benazepril-hydrochlorothiazide) ..... One tab by mouth once daily    Aspirin 81 Mg Tbec (Aspirin) ..... One tab by mouth once daily Patient advised to reduce carbs and sweets, commit to regular physical activity, take meds as prescribed, test blood sugars as directed, and attempt to lose weight , to improve blood sugar control.  Orders: T- Hemoglobin  A1C ([id number])  Problem # 2:  HYPERTENSION (ICD-401.9) Assessment: Unchanged  Her updated medication list for this problem includes:    Benazepril-hydrochlorothiazide 20-12.5 Mg Tabs (Benazepril-hydrochlorothiazide) ..... One tab by mouth once daily  Orders: T-Basic Metabolic Panel [id number])  BP today: 100/80 Prior BP: 108/80 (08/18/2009)  Labs Reviewed: K+: 4.9 (01/15/2010) Creat: : 1.12 (01/15/2010)   Chol: 249 (01/15/2010)    HDL: 48 (01/15/2010)   LDL: 166 (01/15/2010)   TG: 173 (01/15/2010)  Problem # 3:  HYPOTHYROIDISM (ICD-244.9) Assessment: Improved  Her updated medication list for this problem includes:    Synthroid 150 Mcg Tabs (Levothyroxine sodium) ..... One tab po qd  Labs Reviewed: TSH: 4.408 (01/15/2010)    HgBA1c: 6.9 (01/15/2010) Chol: 249 (01/15/2010)   HDL: 48 (01/15/2010)   LDL: 166 (01/15/2010)   TG: 173 (01/15/2010)  Problem # 4:  HYPERLIPIDEMIA (ICD-272.4) Assessment: Improved  The following medications were removed from the medication list:    Lovastatin 40 Mg Tabs (Lovastatin) .Marland Kitchen... 2 at bedtime Low fat dietdiscussed and encouraged  Orders: T-Lipid Profile ([id number])  Labs Reviewed: SGOT: 22 (01/15/2010)   SGPT: 22 (01/15/2010)   HDL:48 (01/15/2010), 41 (09/17/2009)  LDL:166 (01/15/2010), 178 (09/17/2009)  Chol:249 (01/15/2010), 254 (09/17/2009)  Trig:173 (01/15/2010), 175 (09/17/2009)  Complete Medication List: 1)  Benazepril-hydrochlorothiazide 20-12.5 Mg Tabs (Benazepril-hydrochlorothiazide) .... One tab by mouth once daily 2)  Klor-con M20 20 Meq Tbcr (Potassium chloride crys cr) .... One tab by mouth once daily 3)  Aspirin 81 Mg Tbec (Aspirin) .... One tab by mouth once daily 4)  Alprazolam 0.25 Mg Tabs (Alprazolam) .... One tab by mouth once daily as needed 5)  Synthroid 150 Mcg Tabs (Levothyroxine sodium) .... One tab po qd 6)  Oscal 500/200 D-3 500-200 Mg-unit Tabs (Calcium-vitamin d) .... Take 1 tablet by mouth three times a day 7)  Dexilant 30 Mg Cpdr (Dexlansoprazole) .... One cap by mouth qd 8)  Hydroxyzine Hcl 25 Mg Tabs (Hydroxyzine hcl) .... Take 1 every 8 hrs as needed for anxiety and itching  Other Orders: T-TSH ([id number])  Patient Instructions: 1)  Please schedule a follow-up appointment in 4.5 months. 2)  It is important that you exercise regularly at least 20 minutes 5 times a week. If you develop chest pain, have severe difficulty breathing, or  feel very tired , stop exercising immediately and seek medical attention. 3)  You need to lose weight. Consider a lower calorie diet and regular exercise.  4)  BMP prior to visit, ICD-9: 5)  Lipid Panel prior to visit, ICD-9:   fasting in 4.5 months 6)  TSH prior to visit, ICD-9: 7)  HbgA1C prior to visit, ICD-9:   Orders Added: 1)  Est. Patient Level IV [28413] 2)  T-Basic Metabolic Panel [[id number]] 3)  T-Lipid Profile [[id number]] 4)  T- Hemoglobin A1C [83036-23375] 5)  T-TSH [[id number]]

## 2010-02-27 ENCOUNTER — Encounter: Payer: Self-pay | Admitting: Family Medicine

## 2010-03-04 NOTE — Letter (Signed)
Summary: rx assistance  rx assistance   Imported By: Lind Guest 02/27/2010 15:09:08  _____________________________________________________________________  External Attachment:    Type:   Image     Comment:   External Document

## 2010-03-23 ENCOUNTER — Encounter: Payer: Self-pay | Admitting: Family Medicine

## 2010-04-02 NOTE — Letter (Signed)
Summary: rx assistance  rx assistance   Imported By: Lind Guest 03/23/2010 09:11:00  _____________________________________________________________________  External Attachment:    Type:   Image     Comment:   External Document

## 2010-04-27 ENCOUNTER — Emergency Department (HOSPITAL_COMMUNITY)
Admission: EM | Admit: 2010-04-27 | Discharge: 2010-04-27 | Disposition: A | Discharge status: Home / Self Care | Payer: Self-pay | Attending: Emergency Medicine | Admitting: Emergency Medicine

## 2010-04-27 ENCOUNTER — Telehealth: Payer: Self-pay

## 2010-04-27 ENCOUNTER — Emergency Department (HOSPITAL_COMMUNITY): Payer: Self-pay

## 2010-04-27 DIAGNOSIS — E039 Hypothyroidism, unspecified: Secondary | ICD-10-CM | POA: Insufficient documentation

## 2010-04-27 DIAGNOSIS — Z79899 Other long term (current) drug therapy: Secondary | ICD-10-CM | POA: Insufficient documentation

## 2010-04-27 DIAGNOSIS — R0789 Other chest pain: Secondary | ICD-10-CM | POA: Insufficient documentation

## 2010-04-27 DIAGNOSIS — I10 Essential (primary) hypertension: Secondary | ICD-10-CM | POA: Insufficient documentation

## 2010-04-27 DIAGNOSIS — Z7982 Long term (current) use of aspirin: Secondary | ICD-10-CM | POA: Insufficient documentation

## 2010-04-27 DIAGNOSIS — R55 Syncope and collapse: Secondary | ICD-10-CM | POA: Insufficient documentation

## 2010-04-27 DIAGNOSIS — K219 Gastro-esophageal reflux disease without esophagitis: Secondary | ICD-10-CM | POA: Insufficient documentation

## 2010-04-27 LAB — COMPREHENSIVE METABOLIC PANEL
Albumin: 3.6 g/dL (ref 3.5–5.2)
Alkaline Phosphatase: 73 U/L (ref 39–117)
BUN: 17 mg/dL (ref 6–23)
Calcium: 9.5 mg/dL (ref 8.4–10.5)
Creatinine, Ser: 1.69 mg/dL — ABNORMAL HIGH (ref 0.4–1.2)
Glucose, Bld: 95 mg/dL (ref 70–99)
Potassium: 4 mEq/L (ref 3.5–5.1)
Total Protein: 6.9 g/dL (ref 6.0–8.3)

## 2010-04-27 LAB — URINALYSIS, ROUTINE W REFLEX MICROSCOPIC
Glucose, UA: NEGATIVE mg/dL
Ketones, ur: NEGATIVE mg/dL
Nitrite: NEGATIVE
pH: 6 (ref 5.0–8.0)

## 2010-04-27 LAB — POCT CARDIAC MARKERS
CKMB, poc: <1 ng/mL — ABNORMAL LOW (ref 1.0–8.0)
Myoglobin, poc: 104 ng/mL (ref 12–200)
Troponin i, poc: <0.05 ng/mL (ref 0.00–0.09)

## 2010-04-27 LAB — D-DIMER, QUANTITATIVE: D-Dimer, Quant: 0.26 ug/mL-FEU (ref 0.00–0.48)

## 2010-05-06 ENCOUNTER — Other Ambulatory Visit: Payer: Self-pay | Admitting: Family Medicine

## 2010-06-03 ENCOUNTER — Telehealth: Payer: Self-pay | Admitting: Family Medicine

## 2010-06-03 NOTE — Telephone Encounter (Signed)
Returned call again, no answer, mailbox full

## 2010-06-03 NOTE — Telephone Encounter (Signed)
Returned call, mailbox full

## 2010-06-03 NOTE — Telephone Encounter (Signed)
Advised er or urgent care, patient is concerned about blood clot

## 2010-06-09 NOTE — Op Note (Signed)
[name], [name]                 ACCOUNT NO.:  [id number]   MEDICAL RECORD NO.:  [id number]          PATIENT TYPE:  AMB   LOCATION:  DAY                           FACILITY:  [facility]   PHYSICIAN:  R. Roetta Sessions, M.D. DATE OF BIRTH:  [birth year]   DATE OF PROCEDURE:  12/18/2007  DATE OF DISCHARGE:                               OPERATIVE REPORT   PROCEDURE:  Screening iliac colonoscopy.   INDICATIONS FOR PROCEDURE:  A 55 year old lady with no lower GI tract  symptoms and no prior colon imaging comes for screening, her first ever  screening colonoscopy.  Risks, benefits, alternatives, and limitations  have been reviewed.  Questions answered, all parties agreeable.  Ms.  [name] does not have a family history of colorectal neoplasia or polyps.   PROCEDURE NOTE:  O2 saturation, blood pressure, pulse, and respirations  were monitored throughout the entire procedure.   CONSCIOUS SEDATION:  Versed 5 mg IV, Demerol 100 mg IV in divided doses.   INSTRUMENT:  Pentax video chip system.   FINDINGS:  Digital rectal exam revealed no abnormalities.   ENDOSCOPIC FINDINGS:  The prep was adequate.  Colon:  Colonic mucosa was  surveyed from the rectosigmoid junction through the left transverse  right colon to the appendiceal orifice, ileocecal valve, and cecum.  These structures were well seen and photographed for the record.  Terminal ileum was intubated at 10 cm.  From this level, the scope was  slowly withdrawn.  All previously-mentioned mucosal surfaces were again  seen.  The patient had a long redundant colon, however, the mucosal  surfaces appeared normal as the terminal mucosa.  The scope was pulled  down the rectum.  A thorough examination of the rectal mucosa including  retroflex view of the anal verge demonstrated no abnormalities.  The  patient tolerated the procedure well and it was reactive endoscopy.   IMPRESSION:  Normal rectum.  Long tortuous, but otherwise normal-  appearing colon.   Normal terminal ileum.   RECOMMENDATIONS:  Consider repeat screening colonoscopy 10 years.      Jonathon Bellows, M.D.  Electronically Signed     RMR/MEDQ  D:  12/18/2007  T:  12/19/2007  Job:  [id number]   cc:   Milus Mallick. Lodema Hong, M.D.  Fax: [phone]

## 2010-06-11 ENCOUNTER — Encounter: Payer: Self-pay | Admitting: Family Medicine

## 2010-06-12 NOTE — Procedures (Signed)
NAMEDENYS, LABREE NO.:  [id number]   MEDICAL RECORD NO.:  [id number]          PATIENT TYPE:  REC   LOCATION:                                FACILITY:  [facility]   PHYSICIAN:  Vida Roller, M.D.   DATE OF BIRTH:  03/23/1955   DATE OF PROCEDURE:  DATE OF DISCHARGE:                                    STRESS TEST   HISTORY:  [name] is a __________ with no known coronary disease and  atypical chest discomfort. Cardiac risk factors include family history,  hypertension, and hyperlipidemia.   BASELINE DATA:  Electrocardiogram reveals a sinus rhythm of 75 beats  __________ blood pressure is 110/70.   STRESS DATA:  The patient exercised 3 minutes and 34 seconds in Bruce  protocol stage I and obtained for 4.6 METS. Maximal heart rate was 163 beats  per minute which is 95% of predicted maximum. Maximum blood pressure was  172/78 and resolved down to 112/68 in recovery.   The patient reported shortness of breath and leg fatigue with exercise. No  chest discomfort was noted. Electrocardiogram showed one ventricular  couplet. No ischemic changes were noted.   Final images and results are pending M.D. review.      Jae Dire, P.A. [facility]      Vida Roller, M.D.  Electronically Signed    AB/MEDQ  D:  10/29/2004  T:  10/29/2004  Job:  [id number]

## 2010-06-17 ENCOUNTER — Encounter: Payer: Self-pay | Admitting: Family Medicine

## 2010-06-18 ENCOUNTER — Encounter: Payer: Self-pay | Admitting: *Deleted

## 2010-06-18 ENCOUNTER — Ambulatory Visit (INDEPENDENT_AMBULATORY_CARE_PROVIDER_SITE_OTHER): Payer: Self-pay | Admitting: Family Medicine

## 2010-06-18 ENCOUNTER — Encounter: Payer: Self-pay | Admitting: Family Medicine

## 2010-06-18 VITALS — BP 122/80 | HR 82 | Resp 16 | Ht 69.0 in | Wt 300.1 lb

## 2010-06-18 DIAGNOSIS — M79609 Pain in unspecified limb: Secondary | ICD-10-CM

## 2010-06-18 DIAGNOSIS — I10 Essential (primary) hypertension: Secondary | ICD-10-CM

## 2010-06-18 DIAGNOSIS — M549 Dorsalgia, unspecified: Secondary | ICD-10-CM

## 2010-06-18 DIAGNOSIS — E039 Hypothyroidism, unspecified: Secondary | ICD-10-CM

## 2010-06-18 DIAGNOSIS — M79651 Pain in right thigh: Secondary | ICD-10-CM | POA: Insufficient documentation

## 2010-06-18 DIAGNOSIS — M79603 Pain in arm, unspecified: Secondary | ICD-10-CM | POA: Insufficient documentation

## 2010-06-18 MED ORDER — IBUPROFEN 800 MG PO TABS: 800.0000 mg | ORAL_TABLET | Freq: Three times a day (TID) | ORAL | Status: AC | PRN

## 2010-06-18 MED ORDER — PREDNISONE (PAK) 5 MG PO TABS: 5.0000 mg | ORAL_TABLET | ORAL | Status: DC

## 2010-06-18 MED ORDER — KETOROLAC TROMETHAMINE 30 MG/ML IJ SOLN
60.0000 mg | Freq: Once | INTRAMUSCULAR | Status: AC
  Administered 2010-06-18: 60 mg via INTRAMUSCULAR

## 2010-06-18 MED ORDER — METHYLPREDNISOLONE ACETATE 80 MG/ML IJ SUSP
80.0000 mg | Freq: Once | INTRAMUSCULAR | Status: AC
  Administered 2010-06-18: 80 mg via INTRAMUSCULAR

## 2010-06-18 NOTE — Progress Notes (Signed)
  Subjective:    Patient ID: [name], female    DOB: [birth year], 55 y.o.   MRN: [id number]  HPI 3 week h/o left upper extremity numbness from post shoulder to fingetrips and numbness, also right mid thigh numbness and pain , no recent trauma. No other complaints. Currently pain is about 1 , by evening over 10    Review of Systems Denies recent fever or chills. Denies sinus pressure, nasal congestion, ear pain or sore throat. Denies chest congestion, productive cough or wheezing. Denies chest pains, palpitations, paroxysmal nocturnal dyspnea, orthopnea and leg swelling Denies abdominal pain, nausea, vomiting,diarrhea or constipation.  Denies rectal bleeding or change in bowel movement. Denies dysuria, frequency, hesitancy or incontinence.  Denies headaches, seizure, numbness, or tingling. Denies depression, anxiety or insomnia. Denies skin break down or rash.        Objective:   Physical Exam Patient alert and oriented and in no Cardiopulmonary distress.  HEENT: No facial asymmetry, EOMI, no sinus tenderness, TM's clear, Oropharynx pink and moist.  Neck decreased ROM with left trapezius spasm.  Chest: Clear to auscultation bilaterally.  CVS: S1, S2 no murmurs, no S3.  ABD: Soft non tender. Bowel sounds normal.  Ext: No edema  MS: decreased  ROM spine, shoulders, hips and knees.No calf tenderness, Homan's neg. Tender lateral aspect right distal thigh, no erythema or warmth, supersensitive  Skin: Intact, no ulcerations or rash noted.  Psych: Good eye contact, normal affect. Memory intact not anxious or depressed appearing.  CNS: CN 2-12 intact, power, tone and sensation normal throughout.        Assessment & Plan:

## 2010-06-18 NOTE — Patient Instructions (Signed)
F/u in 3 months.  Call sooner if worse. I believe you have a pinched neve in your neck , and some nerve irritation around your lower right thigh.  Injections in the office today and meds are being sent in also

## 2010-06-22 NOTE — Assessment & Plan Note (Signed)
Controlled, no change in medication  

## 2010-06-22 NOTE — Assessment & Plan Note (Signed)
New , likely rel;ated to nerve irritation, pt to call if no relief

## 2010-06-22 NOTE — Assessment & Plan Note (Signed)
Deteriorated, injections administered and med sent in, pt to call if symptoms worsen

## 2010-07-10 ENCOUNTER — Other Ambulatory Visit: Payer: Self-pay | Admitting: Family Medicine

## 2010-09-21 ENCOUNTER — Ambulatory Visit: Payer: Self-pay | Admitting: Family Medicine

## 2010-10-16 ENCOUNTER — Encounter: Payer: Self-pay | Admitting: Family Medicine

## 2010-10-19 ENCOUNTER — Encounter: Payer: Self-pay | Admitting: Family Medicine

## 2010-10-19 ENCOUNTER — Other Ambulatory Visit: Payer: Self-pay | Admitting: Family Medicine

## 2010-10-19 ENCOUNTER — Other Ambulatory Visit: Payer: Self-pay

## 2010-10-19 ENCOUNTER — Ambulatory Visit (INDEPENDENT_AMBULATORY_CARE_PROVIDER_SITE_OTHER): Payer: Self-pay | Admitting: Family Medicine

## 2010-10-19 VITALS — BP 100/70 | HR 83 | Resp 16 | Ht 69.0 in | Wt 298.0 lb

## 2010-10-19 DIAGNOSIS — E785 Hyperlipidemia, unspecified: Secondary | ICD-10-CM

## 2010-10-19 DIAGNOSIS — M549 Dorsalgia, unspecified: Secondary | ICD-10-CM

## 2010-10-19 DIAGNOSIS — E039 Hypothyroidism, unspecified: Secondary | ICD-10-CM

## 2010-10-19 DIAGNOSIS — E669 Obesity, unspecified: Secondary | ICD-10-CM

## 2010-10-19 DIAGNOSIS — E119 Type 2 diabetes mellitus without complications: Secondary | ICD-10-CM

## 2010-10-19 DIAGNOSIS — Z139 Encounter for screening, unspecified: Secondary | ICD-10-CM

## 2010-10-19 DIAGNOSIS — I10 Essential (primary) hypertension: Secondary | ICD-10-CM

## 2010-10-19 DIAGNOSIS — Z23 Encounter for immunization: Secondary | ICD-10-CM

## 2010-10-19 DIAGNOSIS — M5090 Cervical disc disorder, unspecified, unspecified cervical region: Secondary | ICD-10-CM

## 2010-10-19 MED ORDER — INDOMETHACIN 25 MG PO CAPS: 25.0000 mg | ORAL_CAPSULE | Freq: Two times a day (BID) | ORAL | Status: AC

## 2010-10-19 MED ORDER — BENAZEPRIL-HYDROCHLOROTHIAZIDE 20-12.5 MG PO TABS: 1.0000 | ORAL_TABLET | Freq: Every day | ORAL | Status: DC

## 2010-10-19 MED ORDER — INFLUENZA VAC TYPES A & B PF IM SUSP: 0.5000 mL | Freq: Once | INTRAMUSCULAR | Status: DC

## 2010-10-19 NOTE — Assessment & Plan Note (Signed)
Need to f/u on labs to determine level of control

## 2010-10-19 NOTE — Progress Notes (Signed)
  Subjective:    Patient ID: [name], female    DOB: 11/29/1955, 55 y.o.   MRN: [id number]  HPI The PT is here for follow up and re-evaluation of chronic medical conditions, medication management and review of any available recent lab and radiology data.  Preventive health is updated, specifically  Cancer screening and Immunization.   Questions or concerns regarding consultations or procedures which the PT has had in the interim are  addressed. The PT denies any adverse reactions to current medications since the last visit.  States her back pain is not well controlled, and is specifically requesting indomethacin, which she states helped most in the past. Also is requesting that her c spine be imaged for neck pain radiating to upper extremities    Review of Systems See HPI Denies recent fever or chills. Denies sinus pressure, nasal congestion, ear pain or sore throat. Denies chest congestion, productive cough or wheezing. Denies chest pains, palpitations and leg swelling Denies abdominal pain, nausea, vomiting,diarrhea or constipation.   Denies dysuria, frequency, hesitancy or incontinence.  Denies headaches, seizures, numbness, or tingling. Denies depression, anxiety or insomnia. Denies skin break down or rash.        Objective:   Physical Exam Patient alert and oriented and in no cardiopulmonary distress.  HEENT: No facial asymmetry, EOMI, no sinus tenderness,  oropharynx pink and moist.  Neck decreased ROM  no adenopathy.  Chest: Clear to auscultation bilaterally.  CVS: S1, S2 no murmurs, no S3.  ABD: Soft non tender. Bowel sounds normal.  Ext: No edema  MS: Decreased  ROM spine,adequate in shoulders, hips and knees.  Skin: Intact, no ulcerations or rash noted.  Psych: Good eye contact, normal affect. Memory intact not anxious or depressed appearing.  CNS: CN 2-12 intact, power, tone and sensation normal throughout.        Assessment & Plan:

## 2010-10-19 NOTE — Assessment & Plan Note (Signed)
Uncontrolled, requests indomethacin specifically, states she does well with that

## 2010-10-19 NOTE — Assessment & Plan Note (Signed)
Unchanged. Patient re-educated about  the importance of commitment to a  minimum of 150 minutes of exercise per week. The importance of healthy food choices with portion control discussed. Encouraged to start a food diary, count calories and to consider  joining a support group. Sample diet sheets offered. Goals set by the patient for the next several months.    

## 2010-10-19 NOTE — Patient Instructions (Signed)
CPE in early January.  Mammogram will be scheduled.  You are referred for an MRI of your neck.  New med for back pain per your request.  A healthy diet is rich in fruit, vegetables and whole grains. Poultry fish, nuts and beans are a healthy choice for protein rather then red meat. A low sodium diet and drinking 64 ounces of water daily is generally recommended. Oils and sweet should be limited. Carbohydrates especially for those who are diabetic or overweight, should be limited to 30-45 gram per meal. It is important to eat on a regular schedule, at least 3 times daily. Snacks should be primarily fruits, vegetables or nuts.   It is important that you exercise regularly at least 30 minutes 5 times a week. If you develop chest pain, have severe difficulty breathing, or feel very tired, stop exercising immediately and seek medical attention

## 2010-10-19 NOTE — Assessment & Plan Note (Signed)
Improved though persistent, will check MRI to determine the degree of disease, pt has established lumbar disease

## 2010-10-20 LAB — BASIC METABOLIC PANEL
CO2: 31 mEq/L (ref 19–32)
Chloride: 102 mEq/L (ref 96–112)
Potassium: 5 mEq/L (ref 3.5–5.3)
Sodium: 141 mEq/L (ref 135–145)

## 2010-10-20 LAB — LIPID PANEL
HDL: 49 mg/dL (ref >39)
LDL Cholesterol: 161 mg/dL — ABNORMAL HIGH (ref 0–99)
Total CHOL/HDL Ratio: 4.9 Ratio

## 2010-10-20 LAB — HEPATIC FUNCTION PANEL
ALT: 17 U/L (ref 0–35)
AST: 19 U/L (ref 0–37)
Albumin: 4.1 g/dL (ref 3.5–5.2)
Alkaline Phosphatase: 76 U/L (ref 39–117)

## 2010-10-20 LAB — TSH: TSH: 4.112 u[IU]/mL (ref 0.350–4.500)

## 2010-10-21 ENCOUNTER — Other Ambulatory Visit: Payer: Self-pay | Admitting: Family Medicine

## 2010-10-21 ENCOUNTER — Telehealth: Payer: Self-pay | Admitting: *Deleted

## 2010-10-21 MED ORDER — PRAVASTATIN SODIUM 40 MG PO TABS: 40.0000 mg | ORAL_TABLET | Freq: Every evening | ORAL | Status: DC

## 2010-10-21 NOTE — Telephone Encounter (Signed)
Message copied by Diamantina Monks on Wed Oct 21, 2010 10:09 AM ------      Message from: Syliva Overman MD E      Created: Tue Oct 20, 2010  5:41 AM       pls add hepatic panel.      Advise pt thyroid good, blood sugar improved.      Her cholesterol is very high, needs to follow a low fat diet.      I advise she start med to lower cholesterol once her lipid panel is normal. If she agrees pls send in pravastatin 40mg  one at night #30 refill 4 after the hepatic panel gets back in and I send a msg that it is normal

## 2010-10-21 NOTE — Telephone Encounter (Signed)
Message copied by Diamantina Monks on Wed Oct 21, 2010 10:10 AM ------      Message from: Syliva Overman MD E      Created: Wed Oct 21, 2010  8:16 AM       I sent in the pravstatin

## 2010-10-21 NOTE — Telephone Encounter (Signed)
PATIENT AWARE OF NEW CHOLESTEROL MED

## 2010-10-21 NOTE — Telephone Encounter (Signed)
PATIENT AWARE OF LAB RESULTS °

## 2010-10-26 ENCOUNTER — Telehealth: Payer: Self-pay | Admitting: Family Medicine

## 2010-10-26 ENCOUNTER — Inpatient Hospital Stay (HOSPITAL_COMMUNITY): Admission: RE | Admit: 2010-10-26 | Payer: Self-pay | Source: Ambulatory Visit

## 2010-10-26 MED ORDER — HYDROCODONE-ACETAMINOPHEN 5-500 MG PO TABS: 1.0000 | ORAL_TABLET | Freq: Two times a day (BID) | ORAL | Status: DC | PRN

## 2010-10-26 NOTE — Telephone Encounter (Signed)
She is on indomethacin, what is she requesting? Ok to send in vicodin 5mg  one twice daily as needed for back pain #30 only If she wants something else let me know

## 2010-10-26 NOTE — Telephone Encounter (Signed)
Patient aware and med sent  

## 2010-10-27 ENCOUNTER — Ambulatory Visit (HOSPITAL_COMMUNITY)
Admission: RE | Admit: 2010-10-27 | Discharge: 2010-10-27 | Disposition: A | Discharge status: Home / Self Care | Payer: Self-pay | Source: Ambulatory Visit | Attending: Family Medicine | Admitting: Family Medicine

## 2010-10-27 DIAGNOSIS — Z139 Encounter for screening, unspecified: Secondary | ICD-10-CM

## 2010-10-27 DIAGNOSIS — Z1231 Encounter for screening mammogram for malignant neoplasm of breast: Secondary | ICD-10-CM | POA: Insufficient documentation

## 2010-12-22 ENCOUNTER — Other Ambulatory Visit: Payer: Self-pay | Admitting: Family Medicine

## 2011-01-27 ENCOUNTER — Encounter: Payer: Self-pay | Admitting: Family Medicine

## 2011-01-28 ENCOUNTER — Telehealth: Payer: Self-pay | Admitting: Family Medicine

## 2011-01-28 DIAGNOSIS — I10 Essential (primary) hypertension: Secondary | ICD-10-CM

## 2011-01-28 DIAGNOSIS — E785 Hyperlipidemia, unspecified: Secondary | ICD-10-CM

## 2011-01-28 DIAGNOSIS — R5381 Other malaise: Secondary | ICD-10-CM

## 2011-01-28 DIAGNOSIS — E119 Type 2 diabetes mellitus without complications: Secondary | ICD-10-CM

## 2011-01-28 DIAGNOSIS — E039 Hypothyroidism, unspecified: Secondary | ICD-10-CM

## 2011-01-28 NOTE — Telephone Encounter (Signed)
Needs fasting lipid, hepatic, chem 7 Hba1C and TSH

## 2011-01-28 NOTE — Telephone Encounter (Signed)
Patient aware.

## 2011-01-28 NOTE — Telephone Encounter (Signed)
I don't see where any labs are pending. She had everything in Sept (bmp, lipid, tsh, a1c) Do you just want those repeated before her next OV next week?

## 2011-02-05 ENCOUNTER — Encounter: Payer: Self-pay | Admitting: Family Medicine

## 2011-02-05 ENCOUNTER — Other Ambulatory Visit (HOSPITAL_COMMUNITY)
Admission: RE | Admit: 2011-02-05 | Discharge: 2011-02-05 | Disposition: A | Discharge status: Home / Self Care | Payer: Self-pay | Source: Ambulatory Visit | Attending: Family Medicine | Admitting: Family Medicine

## 2011-02-05 ENCOUNTER — Telehealth: Payer: Self-pay | Admitting: Family Medicine

## 2011-02-05 ENCOUNTER — Ambulatory Visit (INDEPENDENT_AMBULATORY_CARE_PROVIDER_SITE_OTHER): Payer: Self-pay | Admitting: Family Medicine

## 2011-02-05 VITALS — BP 112/76 | HR 84 | Resp 16 | Ht 69.0 in | Wt 288.4 lb

## 2011-02-05 DIAGNOSIS — E119 Type 2 diabetes mellitus without complications: Secondary | ICD-10-CM

## 2011-02-05 DIAGNOSIS — Z01419 Encounter for gynecological examination (general) (routine) without abnormal findings: Secondary | ICD-10-CM | POA: Insufficient documentation

## 2011-02-05 DIAGNOSIS — I10 Essential (primary) hypertension: Secondary | ICD-10-CM

## 2011-02-05 DIAGNOSIS — E669 Obesity, unspecified: Secondary | ICD-10-CM

## 2011-02-05 DIAGNOSIS — E039 Hypothyroidism, unspecified: Secondary | ICD-10-CM

## 2011-02-05 DIAGNOSIS — E785 Hyperlipidemia, unspecified: Secondary | ICD-10-CM

## 2011-02-05 DIAGNOSIS — Z0000 Encounter for general adult medical examination without abnormal findings: Secondary | ICD-10-CM

## 2011-02-05 NOTE — Telephone Encounter (Signed)
Faxed

## 2011-02-05 NOTE — Patient Instructions (Addendum)
F/U in 4.5 months.  Congrats  On weight  Loss, keep it up, continue healthy eating choices and try to start regular physical activity.   HBa1C , fasting lipid and TSH in 4.5 months, BEFORE next visit  A healthy diet is rich in fruit, vegetables and whole grains. Poultry fish, nuts and beans are a healthy choice for protein rather then red meat. A low sodium diet and drinking 64 ounces of water daily is generally recommended. Oils and sweet should be limited. Carbohydrates especially for those who are diabetic or overweight, should be limited to 34-45 gram per meal. It is important to eat on a regular schedule, at least 3 times daily. Snacks should be primarily fruits, vegetables or nuts.   It is important that you exercise regularly at least 30 minutes 5 times a week. If you develop chest pain, have severe difficulty breathing, or feel very tired, stop exercising immediately and seek medical attention

## 2011-02-06 LAB — BASIC METABOLIC PANEL
BUN: 16 mg/dL (ref 6–23)
Creat: 1.04 mg/dL (ref 0.50–1.10)
Glucose, Bld: 114 mg/dL — ABNORMAL HIGH (ref 70–99)

## 2011-02-06 LAB — HEMOGLOBIN A1C
Hgb A1c MFr Bld: 6.4 % — ABNORMAL HIGH (ref <5.7)
Mean Plasma Glucose: 137 mg/dL — ABNORMAL HIGH (ref <117)

## 2011-02-06 LAB — LIPID PANEL
Cholesterol: 229 mg/dL — ABNORMAL HIGH (ref 0–200)
Triglycerides: 97 mg/dL (ref <150)

## 2011-02-06 LAB — HEPATIC FUNCTION PANEL
ALT: 20 U/L (ref 0–35)
Alkaline Phosphatase: 80 U/L (ref 39–117)
Indirect Bilirubin: 0.6 mg/dL (ref 0.0–0.9)
Total Protein: 6.9 g/dL (ref 6.0–8.3)

## 2011-02-07 NOTE — Assessment & Plan Note (Signed)
Improved. Pt applauded on succesful weight loss through lifestyle change, and encouraged to continue same. Weight loss goal set for the next several months.  

## 2011-02-07 NOTE — Assessment & Plan Note (Addendum)
Controlled with diet only, pt to continue same with close f/u

## 2011-02-07 NOTE — Assessment & Plan Note (Signed)
Uncontrolled needs to resume med and follow a low fat diet

## 2011-02-07 NOTE — Assessment & Plan Note (Signed)
Controlled, no change in medication  

## 2011-02-07 NOTE — Progress Notes (Signed)
  Subjective:    Patient ID: [name], female    DOB: [birth year], 56 y.o.   MRN: [id number]  HPI The PT is here for follow annual exam and re-evaluation of chronic medical conditions, medication management and review of any available recent lab and radiology data.  Preventive health is updated, specifically  Cancer screening and Immunization.   Questions or concerns regarding consultations or procedures which the PT has had in the interim are  addressed. The PT denies any adverse reactions to current medications since the last visit.  There are no new concerns. She is working on dietary change to promote weight loss and improve health  There are no specific complaints       Review of Systems See HPI Denies recent fever or chills. Denies sinus pressure, nasal congestion, ear pain or sore throat. Denies chest congestion, productive cough or wheezing. Denies chest pains, palpitations and leg swelling Denies abdominal pain, nausea, vomiting,diarrhea or constipation.   Denies dysuria, frequency, hesitancy or incontinence. Chronic back pain, unchnaged Denies headaches, seizures, numbness, or tingling. Denies depression, anxiety or insomnia. Denies skin break down or rash.        Objective:   Physical Exam Pleasant well nourished female, alert and oriented x 3, in no cardio-pulmonary distress. Afebrile. HEENT No facial trauma or asymetry. Sinuses non tender.  EOMI, PERTL, fundoscopic exam is normal, no hemorhage or exudate.  External ears normal, tympanic membranes clear. Oropharynx moist, no exudate,poor  dentition. Neck: supple, no adenopathy,JVD or thyromegaly.No bruits.  Chest: Clear to ascultation bilaterally.No crackles or wheezes. Non tender to palpation  Breast: No asymetry,no masses. No nipple discharge or inversion. No axillary or supraclavicular adenopathy  Cardiovascular system; Heart sounds normal,  S1 and  S2 ,no S3.  No murmur, or thrill. Apical beat  not displaced Peripheral pulses normal.  Abdomen: Soft, non tender, no organomegaly or masses. No bruits. Bowel sounds normal. No guarding, tenderness or rebound.  Rectal:  No mass. Guaiac negative stool.  GU: External genitalia normal. No lesions. Vaginal canal normal.No discharge. Uterus absent, no adnexal masses, no adnexal tenderness  Musculoskeletal exam: Decreased  ROM of adequate in , hips , shoulders and knees. No deformity ,swelling or crepitus noted. No muscle wasting or atrophy.   Neurologic: Cranial nerves 2 to 12 intact. Power, tone ,sensation and reflexes normal throughout. No disturbance in gait. No tremor.  Skin: Intact, no ulceration, erythema , scaling or rash noted. Pigmentation normal throughout  Psych; Normal mood and affect. Judgement and concentration normal        Assessment & Plan:

## 2011-03-04 ENCOUNTER — Ambulatory Visit: Payer: Self-pay | Admitting: Family Medicine

## 2011-03-04 ENCOUNTER — Telehealth: Payer: Self-pay | Admitting: Family Medicine

## 2011-03-04 NOTE — Telephone Encounter (Signed)
Appointment scheduled for this afternoon.

## 2011-03-08 ENCOUNTER — Encounter: Payer: Self-pay | Admitting: Family Medicine

## 2011-03-08 ENCOUNTER — Ambulatory Visit (INDEPENDENT_AMBULATORY_CARE_PROVIDER_SITE_OTHER): Payer: Self-pay | Admitting: Family Medicine

## 2011-03-08 ENCOUNTER — Telehealth: Payer: Self-pay | Admitting: Family Medicine

## 2011-03-08 DIAGNOSIS — J019 Acute sinusitis, unspecified: Secondary | ICD-10-CM

## 2011-03-08 MED ORDER — AMOXICILLIN-POT CLAVULANATE 875-125 MG PO TABS: 1.0000 | ORAL_TABLET | Freq: Two times a day (BID) | ORAL | Status: AC

## 2011-03-08 NOTE — Patient Instructions (Signed)
Sinusitis  Take the Augmentin as prescribed Use Nasal saline  Take Tylenol as needed, do not take with Vicodin  Sinuses are air pockets within the bones of your face. The growth of bacteria within a sinus leads to infection. The infection prevents the sinuses from draining. This infection is called sinusitis. SYMPTOMS   There will be different areas of pain depending on which sinuses have become infected.  The maxillary sinuses often produce pain beneath the eyes.     Frontal sinusitis may cause pain in the middle of the forehead and above the eyes.  Other problems (symptoms) include:  Toothaches.     Colored, pus-like (purulent) drainage from the nose.     Swelling, warmth, and tenderness over the sinus areas may be signs of infection.  TREATMENT   Sinusitis is most often determined by an exam.X-rays may be taken. If x-rays have been taken, make sure you obtain your results or find out how you are to obtain them. Your caregiver may give you medications (antibiotics). These are medications that will help kill the bacteria causing the infection. You may also be given a medication (decongestant) that helps to reduce sinus swelling.   HOME CARE INSTRUCTIONS    Only take over-the-counter or prescription medicines for pain, discomfort, or fever as directed by your caregiver.     Drink extra fluids. Fluids help thin the mucus so your sinuses can drain more easily.     Applying either moist heat or ice packs to the sinus areas may help relieve discomfort.     Use saline nasal sprays to help moisten your sinuses. The sprays can be found at your local drugstore.  SEEK IMMEDIATE MEDICAL CARE IF:  You have a fever.     You have increasing pain, severe headaches, or toothache.     You have nausea, vomiting, or drowsiness.     You develop unusual swelling around the face or trouble seeing.  MAKE SURE YOU:    Understand these instructions.     Will watch your condition.     Will get  help right away if you are not doing well or get worse.  Document Released: 01/11/2005 Document Revised: 09/23/2010 Document Reviewed: 08/10/2006 [facility] Patient Information 2012 [place], [place].

## 2011-03-08 NOTE — Progress Notes (Signed)
  Subjective:    Patient ID: [name], female    DOB: 09-08-1955, 56 y.o.   MRN: [id number]  HPI  Sinus pain and drainage for past 9 days. + fever ranging 2F- 101F. Sinus drainage is thick, +jaw and tooth pain, +post nasal drip and feels like ears are stuffy. History of allergies and sinus infection in the past, no recent antibiotics. Denies cough, SOB, CP No sick contacts  Review of Systems  GEN- denies fatigue,+ fever, weight loss,weakness, recent illness HEENT- denies eye drainage, change in vision,+ nasal discharge,+sore throat with drainage CVS- denies chest pain, palpitations RESP- denies SOB, cough, wheeze ABD- denies N/V, change in stools, abd pain      Objective:   Physical Exam GEN- NAD, alert and oriented x3 HEENT- PERRL, EOMI, non injected sclera, pink conjunctiva, MMM, oropharynx mild injection, TTP over maxillary sinus, edematous turbinates, thick yellow discharge in nares, TM dull reflex bilat, no injection of TM , poor dentition no abscess seen Neck- Supple, shotty cervical LAD CVS- RRR, no murmur RESP-CTAB Pulses- Radial,          Assessment & Plan:   Acute Sinusitis- treat based on duration and exam. Augmentin x 10 days. Nasal saline

## 2011-03-08 NOTE — Telephone Encounter (Signed)
Dr. Jeanice Lim willing to work pt in today at 3:30pm.

## 2011-04-19 ENCOUNTER — Other Ambulatory Visit: Payer: Self-pay | Admitting: Family Medicine

## 2011-05-14 ENCOUNTER — Telehealth: Payer: Self-pay | Admitting: Family Medicine

## 2011-05-21 NOTE — Telephone Encounter (Signed)
These meds already have refills left. Insurance would not pay for early fill

## 2011-06-14 ENCOUNTER — Other Ambulatory Visit: Payer: Self-pay | Admitting: Family Medicine

## 2011-07-09 ENCOUNTER — Ambulatory Visit: Payer: Self-pay | Admitting: Family Medicine

## 2011-07-19 ENCOUNTER — Other Ambulatory Visit: Payer: Self-pay | Admitting: Family Medicine

## 2011-08-09 ENCOUNTER — Encounter: Payer: Self-pay | Admitting: Family Medicine

## 2011-08-09 ENCOUNTER — Ambulatory Visit (INDEPENDENT_AMBULATORY_CARE_PROVIDER_SITE_OTHER): Payer: Self-pay | Admitting: Family Medicine

## 2011-08-09 VITALS — BP 112/84 | HR 84 | Resp 16 | Ht 69.0 in | Wt 274.8 lb

## 2011-08-09 DIAGNOSIS — E039 Hypothyroidism, unspecified: Secondary | ICD-10-CM

## 2011-08-09 DIAGNOSIS — E785 Hyperlipidemia, unspecified: Secondary | ICD-10-CM

## 2011-08-09 DIAGNOSIS — F329 Major depressive disorder, single episode, unspecified: Secondary | ICD-10-CM

## 2011-08-09 DIAGNOSIS — I10 Essential (primary) hypertension: Secondary | ICD-10-CM

## 2011-08-09 DIAGNOSIS — M549 Dorsalgia, unspecified: Secondary | ICD-10-CM

## 2011-08-09 DIAGNOSIS — E119 Type 2 diabetes mellitus without complications: Secondary | ICD-10-CM

## 2011-08-09 DIAGNOSIS — F3289 Other specified depressive episodes: Secondary | ICD-10-CM

## 2011-08-09 MED ORDER — IBUPROFEN 800 MG PO TABS: 800.0000 mg | ORAL_TABLET | Freq: Three times a day (TID) | ORAL | Status: AC | PRN

## 2011-08-09 MED ORDER — CYCLOBENZAPRINE HCL 10 MG PO TABS: 10.0000 mg | ORAL_TABLET | Freq: Three times a day (TID) | ORAL | Status: DC | PRN

## 2011-08-09 MED ORDER — HYDROCODONE-ACETAMINOPHEN 7.5-750 MG PO TABS: ORAL_TABLET | ORAL | Status: DC

## 2011-08-09 MED ORDER — PREDNISONE (PAK) 5 MG PO TABS: 5.0000 mg | ORAL_TABLET | ORAL | Status: DC

## 2011-08-09 MED ORDER — KETOROLAC TROMETHAMINE 60 MG/2ML IM SOLN
60.0000 mg | Freq: Once | INTRAMUSCULAR | Status: AC
  Administered 2011-08-09: 60 mg via INTRAMUSCULAR

## 2011-08-09 MED ORDER — METHYLPREDNISOLONE ACETATE 80 MG/ML IJ SUSP
80.0000 mg | Freq: Once | INTRAMUSCULAR | Status: AC
  Administered 2011-08-09: 80 mg via INTRAMUSCULAR

## 2011-08-09 NOTE — Assessment & Plan Note (Signed)
dietary management only, continues to lose weight

## 2011-08-09 NOTE — Assessment & Plan Note (Signed)
Controlled, no change in medication  

## 2011-08-09 NOTE — Assessment & Plan Note (Signed)
Increased stress and anxiety, son may have esophageal cancer, awaiting final result

## 2011-08-09 NOTE — Progress Notes (Signed)
  Subjective:    Patient ID: [name], female    DOB: [birth year], 56 y.o.   MRN: [id number]  HPI The PT is here for follow up and re-evaluation of chronic medical conditions, medication management and review of any available recent lab and radiology data.  Preventive health is updated, specifically  Cancer screening and Immunization.   Questions or concerns regarding consultations or procedures which the PT has had in the interim are  addressed. The PT denies any adverse reactions to current medications since the last visit.  2 week h/o increased back pain and in the past 3 days has escalated to the extent that she can barely walk. Pain is in low back radiating up to mid back , which is a change, and down right leg to toes. No incontinence or loss of power noted     Review of Systems See HPI Denies recent fever or chills. Denies sinus pressure, nasal congestion, ear pain or sore throat. Denies chest congestion, productive cough or wheezing. Denies chest pains, palpitations and leg swelling Denies abdominal pain, nausea, vomiting,diarrhea or constipation.   Denies dysuria, frequency, hesitancy or incontinence.  Denies headaches, seizures, numbness, or tingling. Increased  Depression, and anxiety worried over son's health, not suicidal or homicidal Denies skin break down or rash.        Objective:   Physical Exam Patient alert and oriented and in no cardiopulmonary distress.  HEENT: No facial asymmetry, EOMI, no sinus tenderness,  oropharynx pink and moist.  Neck supple no adenopathy.  Chest: Clear to auscultation bilaterally.  CVS: S1, S2 no murmurs, no S3.  ABD: Soft non tender. Bowel sounds normal.  Ext: No edema  MS: decreased ROM spine,adequate in  shoulders, hips and knees.  Skin: Intact, no ulcerations or rash noted.  Psych: Good eye contact, normal affect. Memory intact , tearful, anxious and mildly  depressed appearing.  CNS: CN 2-12 intact, power, tone  and sensation normal throughout.        Assessment & Plan:

## 2011-08-09 NOTE — Assessment & Plan Note (Signed)
Hyperlipidemia:Low fat diet discussed and encouraged.  Uncontrolled , updated labs needed

## 2011-08-09 NOTE — Patient Instructions (Addendum)
F/u as before.  Injections today for back pain, and medication sent to your pharmacy.  Addition of muscle relaxant , and dose increase on pain medication as discussed.  Congrats on weight loss.  HBa1C, TSH, fasting lipid and cmp at next visit  I hope your son's health improves

## 2011-08-09 NOTE — Assessment & Plan Note (Signed)
Uncontrolled, toradol 60mg  and depo medrol 80mg  Im today, Increase vicodin dose and add flexeril

## 2011-09-14 ENCOUNTER — Ambulatory Visit: Payer: Self-pay | Admitting: Family Medicine

## 2011-10-13 ENCOUNTER — Ambulatory Visit: Payer: Self-pay | Admitting: Family Medicine

## 2011-10-13 ENCOUNTER — Other Ambulatory Visit: Payer: Self-pay | Admitting: Family Medicine

## 2011-10-21 ENCOUNTER — Telehealth: Payer: Self-pay | Admitting: Family Medicine

## 2011-10-21 NOTE — Telephone Encounter (Signed)
I don't see this on med list. Is she on this? Ok to send in?

## 2011-10-21 NOTE — Telephone Encounter (Signed)
Requested info faxed to pharmacy

## 2011-10-21 NOTE — Telephone Encounter (Signed)
Send to pharmacy get info on last dispense...quantitiy and date please

## 2011-10-26 ENCOUNTER — Ambulatory Visit: Payer: Self-pay | Admitting: Family Medicine

## 2011-10-26 NOTE — Telephone Encounter (Signed)
This info was put in your box this am

## 2011-10-26 NOTE — Telephone Encounter (Signed)
The only script is for hydrocodone, so deny med, no record of xanax on file

## 2011-10-26 NOTE — Telephone Encounter (Signed)
[facility] has no record of xanax ever being filled

## 2011-11-22 ENCOUNTER — Other Ambulatory Visit: Payer: Self-pay | Admitting: Family Medicine

## 2011-11-23 LAB — HEMOGLOBIN A1C
Hgb A1c MFr Bld: 6.2 % — ABNORMAL HIGH (ref <5.7)
Mean Plasma Glucose: 131 mg/dL — ABNORMAL HIGH (ref <117)

## 2011-11-23 LAB — LIPID PANEL
LDL Cholesterol: 153 mg/dL — ABNORMAL HIGH (ref 0–99)
Total CHOL/HDL Ratio: 4.4 Ratio
Triglycerides: 102 mg/dL (ref <150)
VLDL: 20 mg/dL (ref 0–40)

## 2011-11-25 ENCOUNTER — Encounter: Payer: Self-pay | Admitting: Family Medicine

## 2011-11-25 ENCOUNTER — Ambulatory Visit (INDEPENDENT_AMBULATORY_CARE_PROVIDER_SITE_OTHER): Payer: Self-pay | Admitting: Family Medicine

## 2011-11-25 VITALS — BP 112/74 | HR 73 | Resp 18 | Ht 69.0 in | Wt 273.1 lb

## 2011-11-25 DIAGNOSIS — E785 Hyperlipidemia, unspecified: Secondary | ICD-10-CM

## 2011-11-25 DIAGNOSIS — M549 Dorsalgia, unspecified: Secondary | ICD-10-CM

## 2011-11-25 DIAGNOSIS — E669 Obesity, unspecified: Secondary | ICD-10-CM

## 2011-11-25 DIAGNOSIS — F329 Major depressive disorder, single episode, unspecified: Secondary | ICD-10-CM

## 2011-11-25 DIAGNOSIS — E119 Type 2 diabetes mellitus without complications: Secondary | ICD-10-CM

## 2011-11-25 DIAGNOSIS — I10 Essential (primary) hypertension: Secondary | ICD-10-CM

## 2011-11-25 DIAGNOSIS — E039 Hypothyroidism, unspecified: Secondary | ICD-10-CM

## 2011-11-25 DIAGNOSIS — K219 Gastro-esophageal reflux disease without esophagitis: Secondary | ICD-10-CM

## 2011-11-25 DIAGNOSIS — F411 Generalized anxiety disorder: Secondary | ICD-10-CM

## 2011-11-25 DIAGNOSIS — F3289 Other specified depressive episodes: Secondary | ICD-10-CM

## 2011-11-25 MED ORDER — OXYCODONE-ACETAMINOPHEN 7.5-500 MG PO TABS: ORAL_TABLET | ORAL | Status: AC

## 2011-11-25 MED ORDER — VENLAFAXINE HCL ER 37.5 MG PO CP24: 37.5000 mg | ORAL_CAPSULE | Freq: Every day | ORAL | Status: DC

## 2011-11-25 MED ORDER — HYDROCODONE-ACETAMINOPHEN 7.5-750 MG PO TABS: ORAL_TABLET | ORAL | Status: DC

## 2011-11-25 NOTE — Progress Notes (Signed)
  Subjective:    Patient ID: [name], female    DOB: 01/14/1956, 56 y.o.   MRN: [id number]  HPI The PT is here for follow up and re-evaluation of chronic medical conditions, medication management and review of any available recent lab and radiology data.  Preventive health is updated, specifically  Cancer screening and Immunization.   The PT denies any adverse reactions to current medications since the last visit.  There are no new concerns. Has request for additional percocet to take her through the Winter months in particular when she anticipated flare of arthritis pain Increased stress due to poor health in family, coping fairly well at this time  Continues to work on healthy lifestyle for improved health nd chronic disease management with some success as far as weight and blood sugar are concerned, no change as afar as lipids at this time      Review of Systems See HPI Denies recent fever or chills. Denies sinus pressure, nasal congestion, ear pain or sore throat. Denies chest congestion, productive cough or wheezing. Denies chest pains, palpitations and leg swelling Denies abdominal pain, nausea, vomiting,diarrhea or constipation.   Denies dysuria, frequency, hesitancy or incontinence.  Denies headaches, seizures, numbness, or tingling.  uncontrolled epression, anxiety or insomnia. Denies skin break down or rash.        Objective:   Physical Exam Patient alert and oriented and in no cardiopulmonary distress.  HEENT: No facial asymmetry, EOMI, no sinus tenderness,  oropharynx pink and moist.  Neck supple no adenopathy.  Chest: Clear to auscultation bilaterally.  CVS: S1, S2 no murmurs, no S3.  ABD: Soft non tender. Bowel sounds normal.  Ext: No edema  WU:[id number]   ROM spine, shoulders, hips and knees.  Skin: Intact, no ulcerations or rash noted.  Psych: Good eye contact, normal affect. Memory intact not anxious or depressed appearing.  CNS: CN 2-12  intact, power, tone and sensation normal throughout.        Assessment & Plan:

## 2011-11-25 NOTE — Patient Instructions (Addendum)
CPE in 5 month, call if you need me before  Congrats on labs.  Cholesterol is too high, need to cut back on shrimp, cheese and fatty foods  It is important that you exercise regularly at least 30 minutes 5 times a week. If you develop chest pain, have severe difficulty breathing, or feel very tired, stop exercising immediately and seek medical attention   A healthy diet is rich in fruit, vegetables and whole grains. Poultry fish, nuts and beans are a healthy choice for protein rather then red meat. A low sodium diet and drinking 64 ounces of water daily is generally recommended. Oils and sweet should be limited. Carbohydrates especially for those who are diabetic or overweight, should be limited to 30-45 gram per meal. It is important to eat on a regular schedule, at least 3 times daily. Snacks should be primarily fruits, vegetables or nuts.  Flu vaccine today  Please sched your mammogram  Fasting lipid, chem 7, TSH and HBa1C in 5 months, day of visit  Percocet 20 tabs for the year for severe back pain

## 2011-11-28 NOTE — Assessment & Plan Note (Signed)
Improved. Pt applauded on succesful weight loss through lifestyle change, and encouraged to continue same. Weight loss goal set for the next several months.  

## 2011-11-28 NOTE — Assessment & Plan Note (Signed)
Increased recently due to psycho social stress, no med change

## 2011-11-28 NOTE — Assessment & Plan Note (Signed)
Increase in pain anticipated in Winter months, request for limited quantity of percocet in addition to regular vicodin written granted

## 2011-11-28 NOTE — Assessment & Plan Note (Signed)
Controlled, no change in medication  

## 2011-11-28 NOTE — Assessment & Plan Note (Signed)
Improved, dietary management only 

## 2011-11-28 NOTE — Assessment & Plan Note (Signed)
Controlled, no change in medication DASH diet and commitment to daily physical activity for a minimum of 30 minutes discussed and encouraged, as a part of hypertension management. The importance of attaining a healthy weight is also discussed.  

## 2011-11-28 NOTE — Assessment & Plan Note (Signed)
Unchanged and uncontrolled, elects to utilize lifestyle modification only, as far as diet and physical activity go, at this time

## 2011-12-02 ENCOUNTER — Telehealth: Payer: Self-pay | Admitting: Family Medicine

## 2011-12-03 NOTE — Telephone Encounter (Signed)
Note written, please trype, I will sign, then send and let her know, I believe faxing will be fine

## 2011-12-13 ENCOUNTER — Other Ambulatory Visit: Payer: Self-pay | Admitting: Family Medicine

## 2011-12-13 DIAGNOSIS — Z1231 Encounter for screening mammogram for malignant neoplasm of breast: Secondary | ICD-10-CM

## 2011-12-16 ENCOUNTER — Ambulatory Visit (HOSPITAL_COMMUNITY)
Admission: RE | Admit: 2011-12-16 | Discharge: 2011-12-16 | Disposition: A | Discharge status: Home / Self Care | Payer: Self-pay | Source: Ambulatory Visit | Attending: Family Medicine | Admitting: Family Medicine

## 2011-12-16 DIAGNOSIS — Z1231 Encounter for screening mammogram for malignant neoplasm of breast: Secondary | ICD-10-CM

## 2011-12-25 ENCOUNTER — Other Ambulatory Visit: Payer: Self-pay | Admitting: Family Medicine

## 2012-01-27 ENCOUNTER — Ambulatory Visit (INDEPENDENT_AMBULATORY_CARE_PROVIDER_SITE_OTHER): Payer: Self-pay | Admitting: Family Medicine

## 2012-01-27 ENCOUNTER — Encounter: Payer: Self-pay | Admitting: Family Medicine

## 2012-01-27 VITALS — BP 112/80 | HR 96 | Temp 98.2°F | Resp 16 | Ht 69.0 in | Wt 281.8 lb

## 2012-01-27 DIAGNOSIS — I10 Essential (primary) hypertension: Secondary | ICD-10-CM

## 2012-01-27 DIAGNOSIS — E785 Hyperlipidemia, unspecified: Secondary | ICD-10-CM

## 2012-01-27 DIAGNOSIS — E119 Type 2 diabetes mellitus without complications: Secondary | ICD-10-CM

## 2012-01-27 DIAGNOSIS — J0100 Acute maxillary sinusitis, unspecified: Secondary | ICD-10-CM | POA: Insufficient documentation

## 2012-01-27 DIAGNOSIS — M5090 Cervical disc disorder, unspecified, unspecified cervical region: Secondary | ICD-10-CM

## 2012-01-27 DIAGNOSIS — E669 Obesity, unspecified: Secondary | ICD-10-CM

## 2012-01-27 DIAGNOSIS — J40 Bronchitis, not specified as acute or chronic: Secondary | ICD-10-CM | POA: Insufficient documentation

## 2012-01-27 DIAGNOSIS — E039 Hypothyroidism, unspecified: Secondary | ICD-10-CM

## 2012-01-27 MED ORDER — SULFAMETHOXAZOLE-TRIMETHOPRIM 800-160 MG PO TABS: 1.0000 | ORAL_TABLET | Freq: Two times a day (BID) | ORAL | Status: DC

## 2012-01-27 MED ORDER — PREDNISONE (PAK) 5 MG PO TABS: 5.0000 mg | ORAL_TABLET | ORAL | Status: DC

## 2012-01-27 MED ORDER — BENZONATATE 100 MG PO CAPS: 100.0000 mg | ORAL_CAPSULE | Freq: Four times a day (QID) | ORAL | Status: DC | PRN

## 2012-01-27 NOTE — Assessment & Plan Note (Signed)
Unchanged. Patient re-educated about  the importance of commitment to a  minimum of 150 minutes of exercise per week. The importance of healthy food choices with portion control discussed. Encouraged to start a food diary, count calories and to consider  joining a support group. Sample diet sheets offered. Goals set by the patient for the next several months.    

## 2012-01-27 NOTE — Assessment & Plan Note (Signed)
Antibiotic and decongestant prescribed 

## 2012-01-27 NOTE — Assessment & Plan Note (Signed)
Controlled, no change in medication  

## 2012-01-27 NOTE — Patient Instructions (Addendum)
F/u in 5.5 month  Call if you need me before.  You are being treated for sinusitis and chronic bronchitis, antibiotics and decongestants are prescribed.  The right hand weakness is likely  coming from nerve damage in the neck. Check with the hospital to ssee if you can get help for the MRI of the neck which I had previously ordered Prednisone sent in will help temporarily with your symptoms

## 2012-01-27 NOTE — Assessment & Plan Note (Signed)
Managed with diet only. Patient educated about the importance of limiting  Carbohydrate intake , the need to commit to daily physical activity for a minimum of 30 minutes , and to commit weight loss. The fact that changes in all these areas will reduce or eliminate all together the development of diabetes is stressed.

## 2012-01-27 NOTE — Progress Notes (Signed)
  Subjective:    Patient ID: [name], female    DOB: 1955/05/03, 57 y.o.   MRN: [id number]  HPI 3 week h/o increased head congestion with right maxillary sinus pressure, foul tasting post nasal drainage, sore throa t and cough with intermittent wheezing and yellow green sputum. Has  Had intermittent fever and chills Notes increased right hand weakness and tingling pain around right elbow increased i the past 3 to 5 month, has been unable to get the MRI of her neck to date   Review of Systems See HPI  Denies chest pains, palpitations and leg swelling Denies abdominal pain, nausea, vomiting,diarrhea or constipation.   Denies dysuria, frequency, hesitancy or incontinence.  Denies headaches, seizures,  Denies depression, anxiety or insomnia. Denies skin break down or rash.        Objective:   Physical Exam  Patient alert and oriented and in no cardiopulmonary distress.  HEENT: No facial asymmetry, EOMI, right maxillary  sinus tenderness,  oropharynx pink and moist.  Neck decreased ROM no adenopathy.  Chest: decreased air entry, scattered crackles no wheezes CVS: S1, S2 no murmurs, no S3.  ABD: Soft non tender. Bowel sounds normal.  Ext: No edema  MS: decreased  ROM spine, adequate in , hips and knees.  Skin: Intact, no ulcerations or rash noted.  Psych: Good eye contact, normal affect. Memory intact not anxious or depressed appearing.  CNS: CN 2-12 intact, power, tone and sensation normal throughout.       Assessment & Plan:

## 2012-01-27 NOTE — Assessment & Plan Note (Signed)
Uncontrolled, updated lab prior to next visit Hyperlipidemia:Low fat diet discussed and encouraged.

## 2012-01-27 NOTE — Assessment & Plan Note (Signed)
Antibiotics prescribed 

## 2012-01-27 NOTE — Assessment & Plan Note (Signed)
Controlled, no change in medication DASH diet and commitment to daily physical activity for a minimum of 30 minutes discussed and encouraged, as a part of hypertension management. The importance of attaining a healthy weight is also discussed.  

## 2012-01-27 NOTE — Assessment & Plan Note (Signed)
Increased right hand weakness with tingling reports dropping objects in recent times, still needs MRI of c spine will try again to get help through the [facility]

## 2012-02-02 ENCOUNTER — Telehealth: Payer: Self-pay | Admitting: Family Medicine

## 2012-02-02 ENCOUNTER — Other Ambulatory Visit: Payer: Self-pay | Admitting: Family Medicine

## 2012-02-02 DIAGNOSIS — M5090 Cervical disc disorder, unspecified, unspecified cervical region: Secondary | ICD-10-CM

## 2012-02-02 NOTE — Telephone Encounter (Signed)
Will forward to PCP 

## 2012-02-02 NOTE — Telephone Encounter (Signed)
pls order MRI c spine I will enter, she has neck pain going down the arm

## 2012-02-03 NOTE — Telephone Encounter (Signed)
Appointment @ [facility] 02/04/2012 be there at 2:45 patient is aware

## 2012-02-04 ENCOUNTER — Ambulatory Visit (HOSPITAL_COMMUNITY)
Admission: RE | Admit: 2012-02-04 | Discharge: 2012-02-04 | Disposition: A | Discharge status: Home / Self Care | Payer: Self-pay | Source: Ambulatory Visit | Attending: Family Medicine | Admitting: Family Medicine

## 2012-02-04 DIAGNOSIS — M5030 Other cervical disc degeneration, unspecified cervical region: Secondary | ICD-10-CM | POA: Insufficient documentation

## 2012-02-04 DIAGNOSIS — M79609 Pain in unspecified limb: Secondary | ICD-10-CM | POA: Insufficient documentation

## 2012-02-04 DIAGNOSIS — M542 Cervicalgia: Secondary | ICD-10-CM | POA: Insufficient documentation

## 2012-02-04 DIAGNOSIS — M5090 Cervical disc disorder, unspecified, unspecified cervical region: Secondary | ICD-10-CM

## 2012-02-16 ENCOUNTER — Telehealth: Payer: Self-pay | Admitting: Family Medicine

## 2012-03-03 NOTE — Telephone Encounter (Signed)
Patient aware.

## 2012-03-06 ENCOUNTER — Ambulatory Visit (INDEPENDENT_AMBULATORY_CARE_PROVIDER_SITE_OTHER): Payer: Self-pay | Admitting: Family Medicine

## 2012-03-06 ENCOUNTER — Encounter: Payer: Self-pay | Admitting: Family Medicine

## 2012-03-06 ENCOUNTER — Other Ambulatory Visit (HOSPITAL_COMMUNITY)
Admission: RE | Admit: 2012-03-06 | Discharge: 2012-03-06 | Disposition: A | Discharge status: Home / Self Care | Payer: Self-pay | Source: Ambulatory Visit | Attending: Family Medicine | Admitting: Family Medicine

## 2012-03-06 VITALS — BP 114/80 | HR 85 | Resp 16 | Ht 69.0 in | Wt 287.0 lb

## 2012-03-06 DIAGNOSIS — E669 Obesity, unspecified: Secondary | ICD-10-CM

## 2012-03-06 DIAGNOSIS — Z01419 Encounter for gynecological examination (general) (routine) without abnormal findings: Secondary | ICD-10-CM | POA: Insufficient documentation

## 2012-03-06 DIAGNOSIS — Z124 Encounter for screening for malignant neoplasm of cervix: Secondary | ICD-10-CM

## 2012-03-06 DIAGNOSIS — I10 Essential (primary) hypertension: Secondary | ICD-10-CM

## 2012-03-06 DIAGNOSIS — Z0000 Encounter for general adult medical examination without abnormal findings: Secondary | ICD-10-CM | POA: Insufficient documentation

## 2012-03-06 DIAGNOSIS — Z1211 Encounter for screening for malignant neoplasm of colon: Secondary | ICD-10-CM

## 2012-03-06 DIAGNOSIS — Z1151 Encounter for screening for human papillomavirus (HPV): Secondary | ICD-10-CM | POA: Insufficient documentation

## 2012-03-06 DIAGNOSIS — M549 Dorsalgia, unspecified: Secondary | ICD-10-CM

## 2012-03-06 LAB — COMPREHENSIVE METABOLIC PANEL
AST: 18 U/L (ref 0–37)
Albumin: 4.1 g/dL (ref 3.5–5.2)
Alkaline Phosphatase: 82 U/L (ref 39–117)
BUN: 20 mg/dL (ref 6–23)
Potassium: 5.2 mEq/L (ref 3.5–5.3)
Total Bilirubin: 0.5 mg/dL (ref 0.3–1.2)

## 2012-03-06 LAB — HEMOGLOBIN A1C
Hgb A1c MFr Bld: 6.4 % — ABNORMAL HIGH (ref <5.7)
Mean Plasma Glucose: 137 mg/dL — ABNORMAL HIGH (ref <117)

## 2012-03-06 LAB — POC HEMOCCULT BLD/STL (OFFICE/1-CARD/DIAGNOSTIC): Fecal Occult Blood, POC: NEGATIVE

## 2012-03-06 LAB — LIPID PANEL
LDL Cholesterol: 174 mg/dL — ABNORMAL HIGH (ref 0–99)
Total CHOL/HDL Ratio: 5.3 Ratio

## 2012-03-06 MED ORDER — CYCLOBENZAPRINE HCL 10 MG PO TABS: ORAL_TABLET | ORAL | Status: AC

## 2012-03-06 MED ORDER — KETOROLAC TROMETHAMINE 60 MG/2ML IM SOLN
60.0000 mg | Freq: Once | INTRAMUSCULAR | Status: AC
  Administered 2012-03-06: 60 mg via INTRAMUSCULAR

## 2012-03-06 MED ORDER — METHYLPREDNISOLONE ACETATE 80 MG/ML IJ SUSP
80.0000 mg | Freq: Once | INTRAMUSCULAR | Status: AC
  Administered 2012-03-06: 80 mg via INTRAMUSCULAR

## 2012-03-06 NOTE — Assessment & Plan Note (Signed)
Controlled, no change in medication DASH diet and commitment to daily physical activity for a minimum of 30 minutes discussed and encouraged, as a part of hypertension management. The importance of attaining a healthy weight is also discussed.  

## 2012-03-06 NOTE — Assessment & Plan Note (Signed)
Uncontrolled rated at a 6 to  10, anti inflammatories in office today

## 2012-03-06 NOTE — Assessment & Plan Note (Signed)
Annual  Exam completed as documented. Pt to work consistently on exercise and weight loss. Toradol and depo medrol administered for back pain

## 2012-03-06 NOTE — Progress Notes (Signed)
  Subjective:    Patient ID: [name], female    DOB: 12/30/1955, 57 y.o.   MRN: [id number]  HPI The PT is here for annual exam and re-evaluation of chronic medical conditions, medication management and review of any available recent lab and radiology data.  Preventive health is updated, specifically  Cancer screening and Immunization.   Questions or concerns regarding consultations or procedures which the PT has had in the interim are  addressed. The PT denies any adverse reactions to current medications since the last visit.  Meds are reviewed, back pain is generally uncontrolled, and often has spasm i right neck Explained unable to maintain 2 different but similar narcotics at the same time, so one is  Requests injections for back pain , same administered      Review of Systems    See HPI Denies recent fever or chills. Denies sinus pressure, nasal congestion, ear pain or sore throat. Denies chest congestion, productive cough or wheezing. Denies chest pains, palpitations and leg swelling Denies abdominal pain, nausea, vomiting,diarrhea or constipation.   Denies dysuria, frequency, hesitancy or incontinence. Denies headaches, seizures, numbness, or tingling. Denies uncontrolled depression, anxiety or insomnia. Denies skin break down or rash.     Objective:   Physical Exam Pleasant obese female, alert and oriented x 3, in no cardio-pulmonary distress. Afebrile. HEENT No facial trauma or asymetry. Sinuses non tender.  EOMI, PERTL, fundoscopic exam is normal, no hemorhage or exudate.  External ears normal, tympanic membranes clear. Oropharynx moist, no exudate, od dentition. Neck: decreased ROM with spasm, no adenopathy,JVD or thyromegaly.No bruits.  Chest: Clear to ascultation bilaterally.No crackles or wheezes. Non tender to palpation  Breast: No asymetry,no masses. No nipple discharge or inversion. No axillary or supraclavicular adenopathy  Cardiovascular  system; Heart sounds normal,  S1 and  S2 ,no S3.  No murmur, or thrill. Apical beat not displaced Peripheral pulses normal.  Abdomen: Soft, non tender, no organomegaly or masses. No bruits. Bowel sounds normal. No guarding, tenderness or rebound.  Rectal:  No mass. Guaiac negative stool.  GU: External genitalia normal. No lesions. Vaginal canal normal.Physiologic  discharge. Uterus absent, no adnexal masses, no  adnexal tenderness.  Musculoskeletal exam: Decreased ROM of spine with spasm,normal ROM hips , shoulders and knees. No deformity ,swelling or crepitus noted. No muscle wasting or atrophy.   Neurologic: Cranial nerves 2 to 12 intact. Power, tone ,sensation and reflexes normal throughout. No disturbance in gait. No tremor.  Skin: Intact, no ulceration, erythema , scaling or rash noted. Pigmentation normal throughout  Psych; Normal mood and affect. Judgement and concentration normal       Assessment & Plan:

## 2012-03-06 NOTE — Patient Instructions (Addendum)
F/u in 6 month  Toradol 60 mg and depo medrol 80mg  IM in the office today for neck and back pain. No more vicodin is being prescribed for pain, percocet only. You can also use flexeril one daily as needed, for back pain and spasm  Please work on weight loss, this will improve your health

## 2012-03-06 NOTE — Assessment & Plan Note (Signed)
Deteriorated. Patient re-educated about  the importance of commitment to a  minimum of 150 minutes of exercise per week. The importance of healthy food choices with portion control discussed. Encouraged to start a food diary, count calories and to consider  joining a support group. Sample diet sheets offered. Goals set by the patient for the next several months.    

## 2012-03-07 ENCOUNTER — Other Ambulatory Visit: Payer: Self-pay | Admitting: Family Medicine

## 2012-03-17 ENCOUNTER — Other Ambulatory Visit: Payer: Self-pay

## 2012-03-17 MED ORDER — BENAZEPRIL HCL 20 MG PO TABS: ORAL_TABLET | ORAL | Status: DC

## 2012-03-29 ENCOUNTER — Other Ambulatory Visit: Payer: Self-pay

## 2012-03-29 MED ORDER — PRAVASTATIN SODIUM 40 MG PO TABS: 40.0000 mg | ORAL_TABLET | Freq: Every evening | ORAL | Status: DC

## 2012-03-30 ENCOUNTER — Other Ambulatory Visit: Payer: Self-pay | Admitting: Family Medicine

## 2012-04-20 ENCOUNTER — Encounter: Payer: Self-pay | Admitting: Family Medicine

## 2012-05-03 ENCOUNTER — Other Ambulatory Visit: Payer: Self-pay | Admitting: Family Medicine

## 2012-05-03 ENCOUNTER — Telehealth: Payer: Self-pay | Admitting: Family Medicine

## 2012-05-03 MED ORDER — OXYCODONE-ACETAMINOPHEN 7.5-325 MG PO TABS: ORAL_TABLET | ORAL | Status: DC

## 2012-05-03 NOTE — Telephone Encounter (Signed)
Is she supposed to be on percocet?

## 2012-05-03 NOTE — Telephone Encounter (Signed)
msg left for pt

## 2012-05-03 NOTE — Telephone Encounter (Signed)
Script printed for 20 to last 6 months, I will sign , she needs to collect , thanks

## 2012-06-20 ENCOUNTER — Telehealth: Payer: Self-pay | Admitting: Family Medicine

## 2012-06-20 NOTE — Telephone Encounter (Signed)
Pt called with a 1 month h/o abdominal and chest pain, aggravated by movement, accompanied by nausea at times.Pt advised to call in am to see if she can be seen, she stated she had made several attempts to call today and the phone was always "busy", she also stated that she was too far away [place] the [place] to come to the office, in [place]? I advised her to seek attention at the urgent care or ED based on her symptoms and concern enough to call

## 2012-08-08 ENCOUNTER — Other Ambulatory Visit: Payer: Self-pay | Admitting: Family Medicine

## 2012-08-17 ENCOUNTER — Ambulatory Visit (INDEPENDENT_AMBULATORY_CARE_PROVIDER_SITE_OTHER): Payer: Self-pay | Admitting: Family Medicine

## 2012-08-17 ENCOUNTER — Encounter: Payer: Self-pay | Admitting: Family Medicine

## 2012-08-17 VITALS — BP 114/78 | HR 102 | Resp 16 | Ht 69.0 in | Wt 281.8 lb

## 2012-08-17 DIAGNOSIS — I10 Essential (primary) hypertension: Secondary | ICD-10-CM

## 2012-08-17 DIAGNOSIS — M549 Dorsalgia, unspecified: Secondary | ICD-10-CM

## 2012-08-17 DIAGNOSIS — E039 Hypothyroidism, unspecified: Secondary | ICD-10-CM

## 2012-08-17 DIAGNOSIS — E785 Hyperlipidemia, unspecified: Secondary | ICD-10-CM

## 2012-08-17 DIAGNOSIS — E669 Obesity, unspecified: Secondary | ICD-10-CM

## 2012-08-17 DIAGNOSIS — F329 Major depressive disorder, single episode, unspecified: Secondary | ICD-10-CM

## 2012-08-17 DIAGNOSIS — F3289 Other specified depressive episodes: Secondary | ICD-10-CM

## 2012-08-17 MED ORDER — METHYLPREDNISOLONE ACETATE 80 MG/ML IJ SUSP
80.0000 mg | Freq: Once | INTRAMUSCULAR | Status: AC
Start: 2012-08-17 — End: 2012-08-17
  Administered 2012-08-17: 80 mg via INTRAMUSCULAR

## 2012-08-17 MED ORDER — IBUPROFEN 800 MG PO TABS: 800.0000 mg | ORAL_TABLET | Freq: Three times a day (TID) | ORAL | Status: DC | PRN

## 2012-08-17 MED ORDER — OXYCODONE-ACETAMINOPHEN 7.5-325 MG PO TABS: ORAL_TABLET | ORAL | Status: DC

## 2012-08-17 MED ORDER — PREDNISONE 10 MG PO TABS: ORAL_TABLET | ORAL | Status: DC

## 2012-08-17 MED ORDER — KETOROLAC TROMETHAMINE 60 MG/2ML IJ SOLN
60.0000 mg | Freq: Once | INTRAMUSCULAR | Status: AC
  Administered 2012-08-17: 60 mg via INTRAMUSCULAR

## 2012-08-17 NOTE — Patient Instructions (Addendum)
F/u in 4 month, cancel August appt  Toradol 60mg  IM and depo medrol 80mg  and prednisone, and ibuprofen to follow after  Percocet 20 tablets for 6 month script provided.  Pain contract to be signed today  Fasting lipid, cmp, hBa1c, and tSH in 4 month, before visit

## 2012-08-17 NOTE — Assessment & Plan Note (Addendum)
Back pain radiating to right groin x 10 days, uncontrolled , and with no improvement , tordadol and depo medrol in office then prednisone pulse orally

## 2012-08-20 NOTE — Assessment & Plan Note (Signed)
Hyperlipidemia:Low fat diet discussed and encouraged.  Updated lab for next visit 

## 2012-08-20 NOTE — Progress Notes (Signed)
  Subjective:    Patient ID: [name], female    DOB: 21-Sep-1955, 57 y.o.   MRN: [id number]  HPI 1 week h/o low back pain radiating to right groin and  lower extremity after turning the neck. Denies sensory loss , or incontinence of stool or urine, no loss of power, has established disc disease She is otherwise doing fairly well, no other complaints    Review of Systems See HPI Denies recent fever or chills. Denies sinus pressure, nasal congestion, ear pain or sore throat. Denies chest congestion, productive cough or wheezing. Denies chest pains, palpitations and leg swelling Denies abdominal pain, nausea, vomiting,diarrhea or constipation.   Denies dysuria, frequency, hesitancy or incontinence.  Denies depression, anxiety or insomnia. Denies skin break down or rash.        Objective:   Physical Exam  Patient alert and oriented and in no cardiopulmonary distress.Pt in pain  HEENT: No facial asymmetry, EOMI, no sinus tenderness,  oropharynx pink and moist.  Neck supple no adenopathy.  Chest: Clear to auscultation bilaterally.  CVS: S1, S2 no murmurs, no S3.  ABD: Soft non tender. Bowel sounds normal.  Ext: No edema  MS: decreased ROM spine,adequate in  shoulders,  Skin: Intact, no ulcerations or rash noted.  Psych: Good eye contact, normal affect. Memory intact not anxious or depressed appearing.  CNS: CN 2-12 intact, power, tone and sensation normal throughout.       Assessment & Plan:

## 2012-08-20 NOTE — Assessment & Plan Note (Signed)
Controlled, no change in medication  

## 2012-08-22 ENCOUNTER — Telehealth: Payer: Self-pay | Admitting: Family Medicine

## 2012-08-22 NOTE — Telephone Encounter (Signed)
continued documentation of call, review of MRI reports pt has severe arthritis, disc disease and possible nerve impingement at all levels of spine, cervical, thoracic and lumbar, with worsening right leg pain, loss of strength, needs ED eval,  at nearest facility, she is 2 hours from this area, I did call her back to emphasize the importance of ED eval today

## 2012-08-22 NOTE — Telephone Encounter (Signed)
Pt called in stating the pain in the back radiating to legs has steadily worsened, since last week Friday. She called from [facility], after driving there today, stating that she "could barely get there" experiencing lower extremity weakness and numbness, not incontinent of stool or urine. I advised ED eval as she has sebveere arthritis

## 2012-08-26 ENCOUNTER — Emergency Department (HOSPITAL_COMMUNITY): Payer: Self-pay

## 2012-08-26 ENCOUNTER — Emergency Department (HOSPITAL_COMMUNITY)
Admission: EM | Admit: 2012-08-26 | Discharge: 2012-08-26 | Disposition: A | Discharge status: Home / Self Care | Payer: Self-pay | Attending: Emergency Medicine | Admitting: Emergency Medicine

## 2012-08-26 ENCOUNTER — Encounter (HOSPITAL_COMMUNITY): Payer: Self-pay | Admitting: Emergency Medicine

## 2012-08-26 DIAGNOSIS — IMO0002 Reserved for concepts with insufficient information to code with codable children: Secondary | ICD-10-CM

## 2012-08-26 DIAGNOSIS — E079 Disorder of thyroid, unspecified: Secondary | ICD-10-CM | POA: Insufficient documentation

## 2012-08-26 DIAGNOSIS — M5137 Other intervertebral disc degeneration, lumbosacral region: Secondary | ICD-10-CM | POA: Insufficient documentation

## 2012-08-26 DIAGNOSIS — K219 Gastro-esophageal reflux disease without esophagitis: Secondary | ICD-10-CM | POA: Insufficient documentation

## 2012-08-26 DIAGNOSIS — Z90710 Acquired absence of both cervix and uterus: Secondary | ICD-10-CM | POA: Insufficient documentation

## 2012-08-26 DIAGNOSIS — Z79899 Other long term (current) drug therapy: Secondary | ICD-10-CM | POA: Insufficient documentation

## 2012-08-26 DIAGNOSIS — I10 Essential (primary) hypertension: Secondary | ICD-10-CM | POA: Insufficient documentation

## 2012-08-26 DIAGNOSIS — Z7982 Long term (current) use of aspirin: Secondary | ICD-10-CM | POA: Insufficient documentation

## 2012-08-26 DIAGNOSIS — Z9889 Other specified postprocedural states: Secondary | ICD-10-CM | POA: Insufficient documentation

## 2012-08-26 DIAGNOSIS — M51379 Other intervertebral disc degeneration, lumbosacral region without mention of lumbar back pain or lower extremity pain: Secondary | ICD-10-CM | POA: Insufficient documentation

## 2012-08-26 MED ORDER — DEXAMETHASONE SODIUM PHOSPHATE 4 MG/ML IJ SOLN
8.0000 mg | Freq: Once | INTRAMUSCULAR | Status: AC
  Administered 2012-08-26: 8 mg via INTRAMUSCULAR
  Filled 2012-08-26: qty 2

## 2012-08-26 MED ORDER — HYDROMORPHONE HCL PF 1 MG/ML IJ SOLN
2.0000 mg | Freq: Once | INTRAMUSCULAR | Status: AC
  Administered 2012-08-26: 2 mg via INTRAMUSCULAR
  Filled 2012-08-26: qty 2

## 2012-08-26 NOTE — ED Notes (Signed)
Pt c/o chronic mid/lower back pain. States she was seen by Dr. Lodema Hong on 7/24 and was given steroid injection and torodal injection, and po steriods/percocet/ibuprofen. Pt states she was seen at [facility] on Thursday and given po valium. Pt states pain has gotten worse. Denies injury.

## 2012-08-26 NOTE — ED Provider Notes (Signed)
CSN: [id number]     Arrival date & time 08/26/12  1013 History     First MD Initiated Contact with Patient 08/26/12 1146     Chief Complaint  Patient presents with  . Back Pain   (Consider location/radiation/quality/duration/timing/severity/associated sxs/prior Treatment) HPI Comments: Patient is a 57 year old female who presents to the emergency department with increasing lower back pain. The patient is seen by Dr. Syliva Overman. The patient has had multiple problems with the cervical and the lumbar area spine, going back as far as 2009. The patient has been treated with multiple modalities as an outpatient including steroid injections, physical therapy of, and pain medications. The patient states that in spite of all of this her pain is getting worse, and is now interfering with her activities of daily living. She has not lost control of bowel or bladder. But has increasing pain that will not respond to her Percocet and anti-inflammatory medications.  Most recently the patient was seen at the [facility] on July 31 at which time she had x-rays and was treated for her pain but continues to have problems. Patient presents at this time for evaluation because she feels as though that there is something" broken in her lower back and she was to get it checked".  Patient is a 57 y.o. female presenting with back pain. The history is provided by the patient.  Back Pain Location:  Lumbar spine Associated symptoms: no abdominal pain, no chest pain and no dysuria     Past Medical History  Diagnosis Date  . Hypertension   . Thyroid disease   . GERD (gastroesophageal reflux disease)    Past Surgical History  Procedure Laterality Date  . Appendectomy    . Abdominal hysterectomy    . Cholecystectomy    . Tonsillectomy     Family History  Problem Relation Age of Onset  . Asthma Mother   . Heart disease Mother   . Hypertension Mother   . Heart disease Father   . Diabetes Father   .  Hypertension Father   . Cancer Sister     breast, uterine  . Cancer Maternal Grandmother   . Cancer Paternal Grandfather    History  Substance Use Topics  . Smoking status: Never Smoker   . Smokeless tobacco: Not on file  . Alcohol Use: No   OB History   Grav Para Term Preterm Abortions TAB SAB Ect Mult Living                 Review of Systems  Constitutional: Negative for activity change.       All ROS Neg except as noted in HPI  HENT: Negative for nosebleeds and neck pain.   Eyes: Negative for photophobia and discharge.  Respiratory: Negative for cough, shortness of breath and wheezing.   Cardiovascular: Negative for chest pain and palpitations.  Gastrointestinal: Negative for abdominal pain and blood in stool.  Genitourinary: Negative for dysuria, frequency and hematuria.  Musculoskeletal: Positive for back pain. Negative for arthralgias.  Skin: Negative.   Neurological: Negative for dizziness, seizures and speech difficulty.  Psychiatric/Behavioral: Negative for hallucinations and confusion.    Allergies  Naproxen sodium  Home Medications   Current Outpatient Rx  Name  Route  Sig  Dispense  Refill  . aspirin 81 MG tablet   Oral   Take 81 mg by mouth daily.           . benazepril (LOTENSIN) 20 MG tablet  TAKE ONE TABLET BY MOUTH DAILY   30 tablet   2   . calcium-vitamin D (OSCAL WITH D) 500-200 MG-UNIT per tablet   Oral   Take 1 tablet by mouth daily.         . cyclobenzaprine (FLEXERIL) 10 MG tablet   Oral   Take 10 mg by mouth 4 (four) times daily as needed for muscle spasms.         . Dexlansoprazole (DEXILANT) 30 MG capsule   Oral   Take 30 mg by mouth daily.           . diazepam (VALIUM) 5 MG tablet   Oral   Take 5 mg by mouth 2 (two) times daily as needed (muscle spasm).         . hydrochlorothiazide (MICROZIDE) 12.5 MG capsule      TAKE ONE CAPSULE BY MOUTH ONCE DAILY   30 capsule   2   . ibuprofen (ADVIL,MOTRIN) 800 MG  tablet   Oral   Take 1 tablet (800 mg total) by mouth every 8 (eight) hours as needed for pain.   30 tablet   1   . levothyroxine (SYNTHROID, LEVOTHROID) 150 MCG tablet   Oral   Take 150 mcg by mouth daily.           Marland Kitchen oxyCODONE-acetaminophen (PERCOCET) 7.5-325 MG per tablet      One tablet once daily, as needed, for uncontrolled pain  Twenty tablets to last  6 month   20 tablet   0   . predniSONE (DELTASONE) 10 MG tablet      3ne tablet 3 times daily for 3 days, then one tablet twice daily for 2 days , then one tablet daily for 7 days   20 tablet   0    BP 121/69  Pulse 92  Temp(Src) 97.8 F (36.6 C)  Resp 18  Ht 5\' 8"  (1.727 m)  Wt 281 lb (127.461 kg)  BMI 42.74 kg/m2  SpO2 100% Physical Exam  Nursing note and vitals reviewed. Constitutional: She is oriented to person, place, and time. She appears well-developed and well-nourished.  Non-toxic appearance.  HENT:  Head: Normocephalic.  Right Ear: Tympanic membrane and external ear normal.  Left Ear: Tympanic membrane and external ear normal.  Eyes: EOM and lids are normal. Pupils are equal, round, and reactive to light.  Neck: Normal range of motion. Neck supple. Carotid bruit is not present.  Cardiovascular: Normal rate, regular rhythm, normal heart sounds, intact distal pulses and normal pulses.   Pulmonary/Chest: Breath sounds normal. No respiratory distress.  Abdominal: Soft. Bowel sounds are normal. There is no tenderness. There is no guarding.  Musculoskeletal: Normal range of motion.  There is lumbar area pain with palpation and with change of position. There is no palpable step off. There no hot areas appreciated.  Lymphadenopathy:       Head (right side): No submandibular adenopathy present.       Head (left side): No submandibular adenopathy present.    She has no cervical adenopathy.  Neurological: She is alert and oriented to person, place, and time. She has normal strength. No cranial nerve deficit  or sensory deficit.  No gross sensory or motor deficits appreciated of the lower or upper extremities.  Skin: Skin is warm and dry.  Psychiatric: She has a normal mood and affect. Her speech is normal.    ED Course   Procedures (including critical care time)  Labs Reviewed - No  data to display No results found. No diagnosis found.  MDM  *I have reviewed nursing notes, vital signs, and all appropriate lab and imaging results for this patient.** Patient had a CT scan of the lumbar spine which revealed a large paracentral disc herniation at the L4-5 area. There is also mild disc space narrowing at the L3-4 area. These findings however are unchanged from an MRI done in 2009.  Vital signs are essentially stable.  Patient was treated with intramuscular Dilaudid and Decadron. Patient states she feels some better. Patient will be discharged home, where she will continue her Percocet and anti-inflammatory medication. Patient is advised to see Dr. Lodema Hong or her back specialist as sone as possible for additional evaluation and management of this problem.  Kathie Dike, PA-C 08/26/12 1359

## 2012-08-26 NOTE — ED Provider Notes (Signed)
Medical screening examination/treatment/procedure(s) were performed by non-physician practitioner and as supervising physician I was immediately available for consultation/collaboration.   Hurman Horn, MD 08/26/12 [phone]

## 2012-08-28 ENCOUNTER — Encounter: Payer: Self-pay | Admitting: Family Medicine

## 2012-08-28 ENCOUNTER — Ambulatory Visit (INDEPENDENT_AMBULATORY_CARE_PROVIDER_SITE_OTHER): Payer: Self-pay | Admitting: Family Medicine

## 2012-08-28 VITALS — BP 110/74 | HR 99 | Resp 16 | Ht 69.0 in | Wt 276.0 lb

## 2012-08-28 DIAGNOSIS — M62830 Muscle spasm of back: Secondary | ICD-10-CM

## 2012-08-28 DIAGNOSIS — M549 Dorsalgia, unspecified: Secondary | ICD-10-CM

## 2012-08-28 DIAGNOSIS — M5380 Other specified dorsopathies, site unspecified: Secondary | ICD-10-CM

## 2012-08-28 MED ORDER — OXYCODONE-ACETAMINOPHEN 7.5-325 MG PO TABS: ORAL_TABLET | ORAL | Status: DC

## 2012-08-28 MED ORDER — CYCLOBENZAPRINE HCL 10 MG PO TABS: ORAL_TABLET | ORAL | Status: DC

## 2012-08-28 MED ORDER — PREDNISONE 10 MG PO TABS: ORAL_TABLET | ORAL | Status: AC

## 2012-08-28 MED ORDER — METHYLPREDNISOLONE ACETATE 80 MG/ML IJ SUSP
120.0000 mg | Freq: Once | INTRAMUSCULAR | Status: AC
  Administered 2012-08-28: 120 mg via INTRAMUSCULAR

## 2012-08-28 MED ORDER — KETOROLAC TROMETHAMINE 60 MG/2ML IM SOLN
60.0000 mg | Freq: Once | INTRAMUSCULAR | Status: AC
  Administered 2012-08-28: 60 mg via INTRAMUSCULAR

## 2012-08-28 NOTE — Progress Notes (Signed)
  Subjective:    Patient ID: [name], female    DOB: Jan 01, 1956, 57 y.o.   MRN: [id number]  HPI  Pt in following 2 recent ED visits, [facility] for uncontrolled back pain. She has established disc disease and has been experiencing increased and uncontrolled symptoms over the past 7 to 10 days. Generally responds to anti inflammatories , but this is not the case. Denies incontinence of stool or urine.  Unfortunately, pt has no insurance and this is the current barrier to further care. I advise her to see if there is any help for her to get medical assistance , which she has in the past through our health system. Requests increased pain meds to tide her over this difficult period, she has been responsible about pain medication in the past and her current pain is genuine Review of Systems See HPI Denies recent fever or chills. Denies sinus pressure, nasal congestion, ear pain or sore throat. Denies chest congestion, productive cough or wheezing. Denies chest pains, palpitations and leg swelling Denies abdominal pain, nausea, vomiting,diarrhea or constipation.   Denies dysuria, frequency, hesitancy or incontinence.        Objective:   Physical Exam  Patient alert and oriented and in no cardiopulmonary distress.Pt in  Pain unable to adequately weight bear on right lower extremity, limping  HEENT: No facial asymmetry, EOMI, no sinus tenderness,  oropharynx pink and moist.  Neck supple no adenopathy.  Chest: Clear to auscultation bilaterally.  CVS: S1, S2 no murmurs, no S3.  ABD: Soft non tender. Bowel sounds normal.  Ext: No edema  MS: decreased  ROM spine, rightt  hips and knees.Wasting of right thigh  Skin: Intact, no ulcerations or rash noted.  Psych: Good eye contact, normal affect.   CNS: CN 2-12 intact, decreased power in RLE musclwe wasting and decreased sensation in RLE,       Assessment & Plan:

## 2012-08-28 NOTE — Patient Instructions (Addendum)
F/u in November  Call if you need me before  You have wasting and reduced sensation in right leg with weakness, I am concerned about nerve damage .  I have ordered an MRI of your low back, and suggest you check with Lubertha Basque at the hospital to see if there is ANY help possible for you to get this done. We will wait to hear back from you before the appointment is scheduled  Toradol 60mg   and depo medrol 120mg  in office today, followed by prednisone twice daily for 1 week only  New for spasm is flexeril every 8 hours as needed. Stop valium since not helping  I am Okaying this ONE time  Only , early fill on percocet, and have increased dose to ONE daily.

## 2012-09-04 ENCOUNTER — Ambulatory Visit: Payer: Self-pay | Admitting: Family Medicine

## 2012-09-09 DIAGNOSIS — M545 Low back pain, unspecified: Secondary | ICD-10-CM | POA: Insufficient documentation

## 2012-09-09 NOTE — Assessment & Plan Note (Signed)
Muscle relaxant  prescribed 

## 2012-09-09 NOTE — Assessment & Plan Note (Signed)
Uncontrolled with right lower extremity weakness and wasting, needs rept MRI and may benefit from surgical intervention. Pt to contact assist desk to see if possible to get compassionate care which she has in the past. Toradol , depo medrol in office, inc pain med x 1 month only.] Pt to call back re f/u on tests ordered

## 2012-09-12 ENCOUNTER — Telehealth: Payer: Self-pay | Admitting: Family Medicine

## 2012-09-12 NOTE — Telephone Encounter (Signed)
Noted and msg already sent

## 2012-09-12 NOTE — Telephone Encounter (Signed)
Noted  

## 2012-09-12 NOTE — Telephone Encounter (Signed)
Great, pls schedule the MRI ordered and give her appt info

## 2012-09-15 ENCOUNTER — Ambulatory Visit (HOSPITAL_COMMUNITY)
Admission: RE | Admit: 2012-09-15 | Discharge: 2012-09-15 | Disposition: A | Discharge status: Home / Self Care | Payer: Self-pay | Source: Ambulatory Visit | Attending: Family Medicine | Admitting: Family Medicine

## 2012-09-15 ENCOUNTER — Telehealth: Payer: Self-pay | Admitting: Family Medicine

## 2012-09-15 ENCOUNTER — Encounter (HOSPITAL_COMMUNITY): Payer: Self-pay

## 2012-09-15 DIAGNOSIS — M545 Low back pain, unspecified: Secondary | ICD-10-CM | POA: Insufficient documentation

## 2012-09-15 DIAGNOSIS — M549 Dorsalgia, unspecified: Secondary | ICD-10-CM

## 2012-09-15 DIAGNOSIS — M5126 Other intervertebral disc displacement, lumbar region: Secondary | ICD-10-CM | POA: Insufficient documentation

## 2012-09-15 NOTE — Telephone Encounter (Signed)
i don't see any results yet

## 2012-09-17 NOTE — Telephone Encounter (Signed)
Spoke with patient , explained, that MRI showed disc pressing  On the right S5 root not much changed from MRI of 2009. She stated she was beginning to hurt again, and can't live like that. I advised she directly call the assist desk to see if there was availability of any pain clinic, where she could get epidural on 100% discount that she has through the system this moreso than seeking surgical option but she should explore both. States she understands and will proceed as able I did let her know that I was unaware that either of these options is feasible since I am unaware of either being a  Part of "[facility]"

## 2012-09-18 ENCOUNTER — Telehealth: Payer: Self-pay | Admitting: Family Medicine

## 2012-09-18 NOTE — Telephone Encounter (Signed)
Letter written , pls type, I will sign Info requested is in the letter

## 2012-09-18 NOTE — Telephone Encounter (Signed)
Noted  

## 2012-09-18 NOTE — Telephone Encounter (Signed)
Please advise 

## 2012-09-19 ENCOUNTER — Other Ambulatory Visit: Payer: Self-pay | Admitting: Family Medicine

## 2012-09-19 DIAGNOSIS — M549 Dorsalgia, unspecified: Secondary | ICD-10-CM

## 2012-09-19 NOTE — Telephone Encounter (Signed)
Spoke with patient who is aware of letter. She is asking for letter to be mailed to her.  She also stated that she spoke with Lubertha Basque who found somewhere that her discount will be honored.  She is needing a referral for [facility] in [place].  #[phone]

## 2012-09-19 NOTE — Telephone Encounter (Signed)
Letter ready again, I will refer

## 2012-09-20 ENCOUNTER — Other Ambulatory Visit: Payer: Self-pay

## 2012-09-20 DIAGNOSIS — M549 Dorsalgia, unspecified: Secondary | ICD-10-CM

## 2012-09-20 MED ORDER — OXYCODONE-ACETAMINOPHEN 7.5-325 MG PO TABS: ORAL_TABLET | ORAL | Status: AC

## 2012-09-20 NOTE — Telephone Encounter (Signed)
Letter edited

## 2012-10-10 ENCOUNTER — Encounter: Payer: Self-pay | Admitting: Physical Medicine & Rehabilitation

## 2012-10-10 ENCOUNTER — Telehealth: Payer: Self-pay | Admitting: Family Medicine

## 2012-10-10 NOTE — Telephone Encounter (Signed)
Noted  

## 2012-11-10 ENCOUNTER — Other Ambulatory Visit: Payer: Self-pay | Admitting: Family Medicine

## 2012-11-15 ENCOUNTER — Telehealth: Payer: Self-pay | Admitting: Family Medicine

## 2012-11-22 NOTE — Telephone Encounter (Signed)
Number listed is disconnected

## 2012-11-23 ENCOUNTER — Other Ambulatory Visit: Payer: Self-pay | Admitting: Family Medicine

## 2012-11-23 ENCOUNTER — Telehealth: Payer: Self-pay

## 2012-11-23 MED ORDER — GABAPENTIN 300 MG PO CAPS: ORAL_CAPSULE | ORAL | Status: DC

## 2012-11-23 NOTE — Telephone Encounter (Signed)
Since it is known that she has disc disease pressing on nerve in her back, until she can either see pain specialist for epidural, or neurosurgeon for evaluation for surgery, best option is gabapentin . I have entered pls send. Script says one twice daily, advise her to start with one at bedtime for 3 days then increase as prescribed to twice daily Pls send in after you spk with her

## 2012-11-23 NOTE — Telephone Encounter (Signed)
Med sent,. Patient will think about her options and let us know

## 2012-12-26 LAB — COMPREHENSIVE METABOLIC PANEL
ALT: 16 U/L (ref 0–35)
CO2: 25 mEq/L (ref 19–32)
Calcium: 9.7 mg/dL (ref 8.4–10.5)
Chloride: 101 mEq/L (ref 96–112)
Creat: 0.99 mg/dL (ref 0.50–1.10)
Glucose, Bld: 106 mg/dL — ABNORMAL HIGH (ref 70–99)
Total Protein: 6.9 g/dL (ref 6.0–8.3)

## 2012-12-26 LAB — LIPID PANEL
Cholesterol: 274 mg/dL — ABNORMAL HIGH (ref 0–200)
HDL: 49 mg/dL (ref >39)
LDL Cholesterol: 197 mg/dL — ABNORMAL HIGH (ref 0–99)
Triglycerides: 139 mg/dL (ref <150)

## 2012-12-26 LAB — HEMOGLOBIN A1C: Mean Plasma Glucose: 128 mg/dL — ABNORMAL HIGH (ref <117)

## 2012-12-27 ENCOUNTER — Encounter: Payer: Self-pay | Admitting: Family Medicine

## 2012-12-27 ENCOUNTER — Ambulatory Visit (INDEPENDENT_AMBULATORY_CARE_PROVIDER_SITE_OTHER): Payer: Self-pay | Admitting: Family Medicine

## 2012-12-27 ENCOUNTER — Encounter (INDEPENDENT_AMBULATORY_CARE_PROVIDER_SITE_OTHER): Payer: Self-pay

## 2012-12-27 VITALS — BP 110/72 | HR 92 | Resp 16 | Ht 69.0 in | Wt 290.1 lb

## 2012-12-27 DIAGNOSIS — E039 Hypothyroidism, unspecified: Secondary | ICD-10-CM

## 2012-12-27 DIAGNOSIS — M549 Dorsalgia, unspecified: Secondary | ICD-10-CM

## 2012-12-27 DIAGNOSIS — Z1239 Encounter for other screening for malignant neoplasm of breast: Secondary | ICD-10-CM

## 2012-12-27 DIAGNOSIS — IMO0001 Reserved for inherently not codable concepts without codable children: Secondary | ICD-10-CM

## 2012-12-27 DIAGNOSIS — E119 Type 2 diabetes mellitus without complications: Secondary | ICD-10-CM

## 2012-12-27 DIAGNOSIS — H6691 Otitis media, unspecified, right ear: Secondary | ICD-10-CM

## 2012-12-27 DIAGNOSIS — Z23 Encounter for immunization: Secondary | ICD-10-CM

## 2012-12-27 DIAGNOSIS — I10 Essential (primary) hypertension: Secondary | ICD-10-CM

## 2012-12-27 DIAGNOSIS — E785 Hyperlipidemia, unspecified: Secondary | ICD-10-CM

## 2012-12-27 DIAGNOSIS — E669 Obesity, unspecified: Secondary | ICD-10-CM

## 2012-12-27 DIAGNOSIS — IMO0002 Reserved for concepts with insufficient information to code with codable children: Secondary | ICD-10-CM

## 2012-12-27 DIAGNOSIS — H6690 Otitis media, unspecified, unspecified ear: Secondary | ICD-10-CM

## 2012-12-27 MED ORDER — IBUPROFEN 800 MG PO TABS: 800.0000 mg | ORAL_TABLET | Freq: Three times a day (TID) | ORAL | Status: DC | PRN

## 2012-12-27 MED ORDER — GABAPENTIN 300 MG PO CAPS: 300.0000 mg | ORAL_CAPSULE | Freq: Three times a day (TID) | ORAL | Status: DC

## 2012-12-27 MED ORDER — HYDROCHLOROTHIAZIDE 12.5 MG PO CAPS: ORAL_CAPSULE | ORAL | Status: DC

## 2012-12-27 MED ORDER — BENAZEPRIL HCL 20 MG PO TABS: ORAL_TABLET | ORAL | Status: DC

## 2012-12-27 MED ORDER — OXYCODONE-ACETAMINOPHEN 5-325 MG PO TABS: ORAL_TABLET | ORAL | Status: DC

## 2012-12-27 MED ORDER — SULFAMETHOXAZOLE-TMP DS 800-160 MG PO TABS: 1.0000 | ORAL_TABLET | Freq: Two times a day (BID) | ORAL | Status: AC

## 2012-12-27 MED ORDER — GABAPENTIN 300 MG PO CAPS: ORAL_CAPSULE | ORAL | Status: DC

## 2012-12-27 MED ORDER — LEVOTHYROXINE SODIUM 150 MCG PO TABS: ORAL_TABLET | ORAL | Status: DC

## 2012-12-27 NOTE — Patient Instructions (Signed)
F/u in 4 month, call if you need me before  Cholesterol too high reduce fried and fatty foods, and resume pravastatin 40mg  one at night  Thyroid is under corrected, increase to ONE and a Half tablets  Monday, Wednesday, Friday, Sunday, continue One tablet Tuesday, Thursday and Saturday  Increase gabapentin to one capsule three times daily if this helps pain moore  Oxycodone script is thirty tablets to last four months, you also need to sign a pain contract  Rept TSH in 2 month   Fasting lipid, cmp and EGFr , hBA1C and TSH in 4 month  Please work on weight loss  Mammogram is due we will schedule

## 2012-12-27 NOTE — Progress Notes (Signed)
   Subjective:    Patient ID: [name], female    DOB: [birth year], 57 y.o.   MRN: [id number]  HPI The PT is here for follow up and re-evaluation of chronic medical conditions, medication management and review of any available recent lab and radiology data.  Preventive health is updated, specifically  Cancer screening and Immunization.   States gabapentin has been a wonder drug for her as far as her chronic back pain is concerned, still not pain free , but much improved The PT denies any adverse reactions to current medications since the last visit.  Right ear pain and pressure x 4 days, no drainage from ear, hearing loss, fever or chills       Review of Systems See HPI Denies recent fever or chills. Denies sinus pressure, nasal congestion, or sore throat. Denies chest congestion, productive cough or wheezing. Denies chest pains, palpitations and leg swelling Denies abdominal pain, nausea, vomiting,diarrhea or constipation.   Denies dysuria, frequency, hesitancy or incontinence. DDenies headaches, seizures, numbness, or tingling. Denies depression, anxiety or insomnia. Denies skin break down or rash.        Objective:   Physical Exam Patient alert and oriented and in no cardiopulmonary distress.  HEENT: No facial asymmetry, EOMI, no sinus tenderness,  oropharynx pink and moist.  Neck supple no adenopathy.Right TM dull light reflex and mildly erythematous  Chest: Clear to auscultation bilaterally.  CVS: S1, S2 no murmurs, no S3.  ABD: Soft non tender. Bowel sounds normal.  Ext: No edema  MS: decreased  ROM spine, adequate in  shoulders, hips and knees.  Skin: Intact, no ulcerations or rash noted.  Psych: Good eye contact, normal affect. Memory intact not anxious or depressed appearing.  CNS: CN 2-12 intact, power, tone and sensation normal throughout.        Assessment & Plan:

## 2013-01-02 ENCOUNTER — Ambulatory Visit (HOSPITAL_COMMUNITY): Payer: Self-pay

## 2013-01-28 NOTE — Assessment & Plan Note (Signed)
Controlled with doiet only, pt encouraged to pursue healthy lifestyle and weight loss so blood sugar improves even more

## 2013-01-28 NOTE — Assessment & Plan Note (Signed)
Under corrected, dose increase in replacement med

## 2013-01-28 NOTE — Assessment & Plan Note (Signed)
Unchanged. Patient re-educated about  the importance of commitment to a  minimum of 150 minutes of exercise per week. The importance of healthy food choices with portion control discussed. Encouraged to start a food diary, count calories and to consider  joining a support group. Sample diet sheets offered. Goals set by the patient for the next several months.    

## 2013-01-28 NOTE — Assessment & Plan Note (Signed)
Controlled, no change in medication DASH diet and commitment to daily physical activity for a minimum of 30 minutes discussed and encouraged, as a part of hypertension management. The importance of attaining a healthy weight is also discussed.  

## 2013-01-28 NOTE — Assessment & Plan Note (Signed)
Improved with gabapentin will inc dose to further improve pain management. Restricted quantity of hydrocodone prescribed also, pt has been responsible with this in the past

## 2013-01-28 NOTE — Assessment & Plan Note (Signed)
Uncontrolled, needs ot take statin prescribed and follow low fat diet

## 2013-01-28 NOTE — Assessment & Plan Note (Signed)
Acute right ear infection , antibiotic prescribed

## 2013-02-28 LAB — TSH: TSH: 0.062 u[IU]/mL — ABNORMAL LOW (ref 0.350–4.500)

## 2013-02-28 NOTE — Addendum Note (Signed)
Addended by: Eual Fines on: 02/28/2013 02:13 PM   Modules accepted: Orders

## 2013-03-02 ENCOUNTER — Other Ambulatory Visit: Payer: Self-pay | Admitting: Family Medicine

## 2013-03-02 DIAGNOSIS — Z1231 Encounter for screening mammogram for malignant neoplasm of breast: Secondary | ICD-10-CM

## 2013-03-05 ENCOUNTER — Encounter: Payer: Self-pay | Admitting: Family Medicine

## 2013-03-05 ENCOUNTER — Encounter (INDEPENDENT_AMBULATORY_CARE_PROVIDER_SITE_OTHER): Payer: Self-pay

## 2013-03-05 ENCOUNTER — Ambulatory Visit (INDEPENDENT_AMBULATORY_CARE_PROVIDER_SITE_OTHER): Payer: Self-pay | Admitting: Family Medicine

## 2013-03-05 ENCOUNTER — Other Ambulatory Visit (HOSPITAL_COMMUNITY)
Admission: RE | Admit: 2013-03-05 | Discharge: 2013-03-05 | Disposition: A | Discharge status: Home / Self Care | Payer: Self-pay | Source: Ambulatory Visit | Attending: Family Medicine | Admitting: Family Medicine

## 2013-03-05 ENCOUNTER — Ambulatory Visit (HOSPITAL_COMMUNITY)
Admission: RE | Admit: 2013-03-05 | Discharge: 2013-03-05 | Disposition: A | Discharge status: Home / Self Care | Payer: Self-pay | Source: Ambulatory Visit | Attending: Family Medicine | Admitting: Family Medicine

## 2013-03-05 VITALS — BP 112/78 | HR 88 | Resp 16 | Ht 69.0 in | Wt 296.1 lb

## 2013-03-05 DIAGNOSIS — N760 Acute vaginitis: Secondary | ICD-10-CM

## 2013-03-05 DIAGNOSIS — I10 Essential (primary) hypertension: Secondary | ICD-10-CM

## 2013-03-05 DIAGNOSIS — M5090 Cervical disc disorder, unspecified, unspecified cervical region: Secondary | ICD-10-CM

## 2013-03-05 DIAGNOSIS — IMO0001 Reserved for inherently not codable concepts without codable children: Secondary | ICD-10-CM

## 2013-03-05 DIAGNOSIS — Z1211 Encounter for screening for malignant neoplasm of colon: Secondary | ICD-10-CM

## 2013-03-05 DIAGNOSIS — Z113 Encounter for screening for infections with a predominantly sexual mode of transmission: Secondary | ICD-10-CM

## 2013-03-05 DIAGNOSIS — E785 Hyperlipidemia, unspecified: Secondary | ICD-10-CM

## 2013-03-05 DIAGNOSIS — E039 Hypothyroidism, unspecified: Secondary | ICD-10-CM

## 2013-03-05 DIAGNOSIS — N3090 Cystitis, unspecified without hematuria: Secondary | ICD-10-CM

## 2013-03-05 DIAGNOSIS — E119 Type 2 diabetes mellitus without complications: Secondary | ICD-10-CM

## 2013-03-05 DIAGNOSIS — IMO0002 Reserved for concepts with insufficient information to code with codable children: Secondary | ICD-10-CM

## 2013-03-05 DIAGNOSIS — Z0000 Encounter for general adult medical examination without abnormal findings: Secondary | ICD-10-CM

## 2013-03-05 DIAGNOSIS — Z23 Encounter for immunization: Secondary | ICD-10-CM

## 2013-03-05 DIAGNOSIS — E669 Obesity, unspecified: Secondary | ICD-10-CM

## 2013-03-05 DIAGNOSIS — R002 Palpitations: Secondary | ICD-10-CM

## 2013-03-05 DIAGNOSIS — Z1231 Encounter for screening mammogram for malignant neoplasm of breast: Secondary | ICD-10-CM

## 2013-03-05 LAB — POCT URINALYSIS DIPSTICK
Bilirubin, UA: NEGATIVE
Blood, UA: NEGATIVE
Glucose, UA: NEGATIVE
Ketones, UA: NEGATIVE
Leukocytes, UA: NEGATIVE
Nitrite, UA: NEGATIVE
Protein, UA: NEGATIVE
Spec Grav, UA: 1.01
Urobilinogen, UA: 0.2
pH, UA: 6

## 2013-03-05 LAB — POC HEMOCCULT BLD/STL (OFFICE/1-CARD/DIAGNOSTIC): FECAL OCCULT BLD: NEGATIVE

## 2013-03-05 NOTE — Assessment & Plan Note (Signed)
Deteriorated. Patient re-educated about  the importance of commitment to a  minimum of 150 minutes of exercise per week. The importance of healthy food choices with portion control discussed. Encouraged to start a food diary, count calories and to consider  joining a support group. Sample diet sheets offered. Goals set by the patient for the next several months.    

## 2013-03-05 NOTE — Assessment & Plan Note (Signed)
Worse,  Not taking meds Hyperlipidemia:Low fat diet discussed and encouraged.  Start vytorin

## 2013-03-05 NOTE — Assessment & Plan Note (Signed)
Annual exam as documented. Counseling done  re healthy lifestyle involving commitment to 150 minutes exercise per week, heart healthy diet, and attaining healthy weight.The importance of adequate sleep also discussed. Regular seat belt use and safe storage  of firearms if patient has them, is also discussed. Changes in health habits are decided on by the patient with goals and time frames  set for achieving them. Immunization and cancer screening needs are specifically addressed at this visit.  

## 2013-03-05 NOTE — Assessment & Plan Note (Signed)
rept lab March 24 , dose being adjusted

## 2013-03-05 NOTE — Patient Instructions (Addendum)
F/U in 4 months, call if you need me before TSH, in March  It is important that you exercise regularly at least 30 minutes 5 times a week. If you develop chest pain, have severe difficulty breathing, or feel very tired, stop exercising immediately and seek medical attention    A healthy diet is rich in fruit, vegetables and whole grains. Poultry fish, nuts and beans are a healthy choice for protein rather then red meat. A low sodium diet and drinking 64 ounces of water daily is generally recommended. Oils and sweet should be limited. Carbohydrates especially for those who are diabetic or overweight, should be limited to 45 to 60 gram per meal. It is important to eat on a regular schedule, at least 3 times daily. Snacks should be primarily fruits, vegetables or nuts.  Adequate rest, generally 6 to 8 hours per night is important for good health.Good sleep hygiene involves setting a regular bedtime, and turning off all sound and light in your sleep environment.Limiting caffeine intake will also help with the ability to rest well  Lab work needs to be done 3 to 5 days before your follow up visit please.  All medications need to be brought to every visit  Please start vytorin today.  Please change eating habits so you lose weight   EKG in office today Pneumonia vaccine today

## 2013-03-05 NOTE — Assessment & Plan Note (Signed)
1 week history, normal Ua, needs HSV 2

## 2013-03-06 ENCOUNTER — Other Ambulatory Visit: Payer: Self-pay

## 2013-03-06 ENCOUNTER — Telehealth: Payer: Self-pay | Admitting: *Deleted

## 2013-03-06 LAB — HSV 2 ANTIBODY, IGG: HSV 2 Glycoprotein G Ab, IgG: <0.1 IV

## 2013-03-06 MED ORDER — FLUCONAZOLE 150 MG PO TABS: 150.0000 mg | ORAL_TABLET | Freq: Once | ORAL | Status: DC

## 2013-03-06 NOTE — Telephone Encounter (Signed)
Pt called to get results from her blood test yesterday. Please advise [phone]

## 2013-03-06 NOTE — Telephone Encounter (Signed)
Called patient and she is aware of her lab results

## 2013-03-06 NOTE — Assessment & Plan Note (Signed)
New palpitations intermittent and high CV risk, EKG in office today, NSR, no ischemic chnages

## 2013-03-06 NOTE — Progress Notes (Signed)
Subjective:    Patient ID: [name], female    DOB: Nov 28, 1955, 58 y.o.   MRN: [id number]  HPI The patient is for an annual exam. Health maintenance is reviewed and updated, specifically  recommended,  screening tests and immunizations. Recent lab and radiologic data is reviewed also. C/o intermittent palpitations, and has a very strong f/h of heart disease Overeating with excessive weight gain, has cut down on gabapentin as in less pain and she correcty believes this has contributed   Review of Systems See HPI Denies recent fever or chills. Denies sinus pressure, nasal congestion, ear pain or sore throat. Denies chest congestion, productive cough or wheezing. Denies chest pains, palpitations and leg swelling Denies abdominal pain, nausea, vomiting,diarrhea or constipation.   C/o burning with urination, and stinging, denies fever , nausea or flank pain. Denies headaches, seizures, numbness, or tingling. Denies depression, anxiety or insomnia. Denies skin break down or rash.        Objective:   Physical Exam  Pleasant morbidly obese female, alert and oriented x 3, in no cardio-pulmonary distress. Afebrile. HEENT No facial trauma or asymetry. Sinuses non tender.  EOMI, PERTL,   External ears normal, tympanic membranes clear. Oropharynx moist, no exudate, poor dentition. Neck: supple, no adenopathy,JVD or thyromegaly.No bruits.  Chest: Clear to ascultation bilaterally.No crackles or wheezes. Non tender to palpation  Breast: No asymetry,no masses. No nipple discharge or inversion. No axillary or supraclavicular adenopathy  Cardiovascular system; Heart sounds normal,  S1 and  S2 ,no S3.  No murmur, or thrill. Apical beat not displaced Peripheral pulses normal.  Abdomen: Soft, non tender, no organomegaly or masses. No bruits. Bowel sounds normal. No guarding, tenderness or rebound.  Rectal:  No mass. Guaiac negative stool.  GU: External genitalia  normal. No lesions. Vaginal canal normal.No discharge. Uterus absent, no adnexal masses, no  adnexal tenderness.  Musculoskeletal exam: Decreased ROM of spine, hips , shoulders and knees. No deformity ,swelling or crepitus noted. No muscle wasting or atrophy.   Neurologic: Cranial nerves 2 to 12 intact. Power, tone ,sensation and reflexes normal throughout. No tremor.  Skin: Intact, no ulceration, erythema , scaling or rash noted. Pigmentation normal throughout  Psych; Normal mood and affect. Judgement and concentration normal       Assessment & Plan:  HYPERLIPIDEMIA Worse,  Not taking meds Hyperlipidemia:Low fat diet discussed and encouraged.  Start vytorin  HYPOTHYROIDISM rept lab March 24 , dose being adjusted  OBESITY Deteriorated. Patient re-educated about  the importance of commitment to a  minimum of 150 minutes of exercise per week. The importance of healthy food choices with portion control discussed. Encouraged to start a food diary, count calories and to consider  joining a support group. Sample diet sheets offered. Goals set by the patient for the next several months.     Routine general medical examination at a health care facility Annual exam as documented. Counseling done  re healthy lifestyle involving commitment to 150 minutes exercise per week, heart healthy diet, and attaining healthy weight.The importance of adequate sleep also discussed. Regular seat belt use and safe storage  of firearms if patient has them, is also discussed. Changes in health habits are decided on by the patient with goals and time frames  set for achieving them. Immunization and cancer screening needs are specifically addressed at this visit.   Cystitis 1 week history, normal Ua, needs HSV 2  Palpitations New palpitations intermittent and high CV risk, EKG in office today,  NSR, no ischemic chnages  Radicular pain of right lower back Improved, and dose of gabapentin  has been reduced to one daily by pt

## 2013-03-06 NOTE — Assessment & Plan Note (Signed)
Improved, and dose of gabapentin has been reduced to one daily by pt

## 2013-03-08 ENCOUNTER — Other Ambulatory Visit: Payer: Self-pay

## 2013-03-08 MED ORDER — METRONIDAZOLE 500 MG PO TABS: 500.0000 mg | ORAL_TABLET | Freq: Two times a day (BID) | ORAL | Status: DC

## 2013-04-02 ENCOUNTER — Encounter: Payer: Self-pay | Admitting: Family Medicine

## 2013-04-20 ENCOUNTER — Other Ambulatory Visit: Payer: Self-pay

## 2013-04-20 DIAGNOSIS — IMO0001 Reserved for inherently not codable concepts without codable children: Secondary | ICD-10-CM

## 2013-04-20 MED ORDER — OXYCODONE-ACETAMINOPHEN 5-325 MG PO TABS: ORAL_TABLET | ORAL | Status: DC

## 2013-04-26 ENCOUNTER — Ambulatory Visit: Payer: Self-pay | Admitting: Physical Medicine & Rehabilitation

## 2013-04-27 ENCOUNTER — Ambulatory Visit: Payer: Self-pay | Admitting: Family Medicine

## 2013-06-09 ENCOUNTER — Other Ambulatory Visit: Payer: Self-pay | Admitting: Family Medicine

## 2013-06-29 ENCOUNTER — Ambulatory Visit (INDEPENDENT_AMBULATORY_CARE_PROVIDER_SITE_OTHER): Payer: Self-pay | Admitting: Family Medicine

## 2013-06-29 ENCOUNTER — Encounter: Payer: Self-pay | Admitting: Family Medicine

## 2013-06-29 VITALS — BP 114/78 | HR 99 | Resp 16 | Wt 299.0 lb

## 2013-06-29 DIAGNOSIS — M5090 Cervical disc disorder, unspecified, unspecified cervical region: Secondary | ICD-10-CM

## 2013-06-29 DIAGNOSIS — E119 Type 2 diabetes mellitus without complications: Secondary | ICD-10-CM

## 2013-06-29 DIAGNOSIS — E039 Hypothyroidism, unspecified: Secondary | ICD-10-CM

## 2013-06-29 DIAGNOSIS — IMO0001 Reserved for inherently not codable concepts without codable children: Secondary | ICD-10-CM

## 2013-06-29 DIAGNOSIS — E8881 Metabolic syndrome: Secondary | ICD-10-CM

## 2013-06-29 DIAGNOSIS — E669 Obesity, unspecified: Secondary | ICD-10-CM

## 2013-06-29 DIAGNOSIS — F3289 Other specified depressive episodes: Secondary | ICD-10-CM

## 2013-06-29 DIAGNOSIS — F329 Major depressive disorder, single episode, unspecified: Secondary | ICD-10-CM

## 2013-06-29 DIAGNOSIS — I10 Essential (primary) hypertension: Secondary | ICD-10-CM

## 2013-06-29 DIAGNOSIS — E785 Hyperlipidemia, unspecified: Secondary | ICD-10-CM

## 2013-06-29 DIAGNOSIS — IMO0002 Reserved for concepts with insufficient information to code with codable children: Secondary | ICD-10-CM

## 2013-06-29 MED ORDER — PREDNISONE (PAK) 5 MG PO TABS: ORAL_TABLET | ORAL | Status: DC

## 2013-06-29 MED ORDER — OXYCODONE-ACETAMINOPHEN 5-325 MG PO TABS: ORAL_TABLET | ORAL | Status: AC

## 2013-06-29 NOTE — Patient Instructions (Signed)
F/u in 4 month call if you need me before  Fasting lipid, cmp, hBA1C, TSH and CBc as soon as possible  Please resume medication for depression  Prednisone dose pak is also prescribed for back pain, start after you have labs   Pain medication as before

## 2013-06-30 DIAGNOSIS — E8881 Metabolic syndrome: Secondary | ICD-10-CM | POA: Insufficient documentation

## 2013-06-30 NOTE — Assessment & Plan Note (Signed)
Uncontrolled, prednisone dose pack prescribed 
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
## 2013-06-30 NOTE — Assessment & Plan Note (Signed)
Increased and uncontrolled, increase frequency of pain med, 30 tabs to last 3 month was 4 month Still hoping to get ins coverhge through disability so she can get access to healthcare services she needs

## 2013-06-30 NOTE — Assessment & Plan Note (Signed)
Deteriorated. Patient re-educated about  the importance of commitment to a  minimum of 150 minutes of exercise per week. The importance of healthy food choices with portion control discussed. Encouraged to start a food diary, count calories and to consider  joining a support group. Sample diet sheets offered. Goals set by the patient for the next several months.    

## 2013-06-30 NOTE — Assessment & Plan Note (Signed)
Diet controlled Updated lab needed at/ before next visit. Microalb past due, pt has no insurance so holding on this

## 2013-06-30 NOTE — Assessment & Plan Note (Signed)
Non compliant withj medication, will reconsider this once updated labs available

## 2013-06-30 NOTE — Assessment & Plan Note (Signed)
Updated lab needed at this visit

## 2013-06-30 NOTE — Assessment & Plan Note (Signed)
The increased risk of cardiovascular disease associated with this diagnosis, and the need to consistently work on lifestyle to change this is discussed. Following  a  heart healthy diet ,commitment to 30 minutes of exercise at least 5 days per week, as well as control of blood sugar and cholesterol , and achieving a healthy weight are all the areas to be addressed .  

## 2013-06-30 NOTE — Assessment & Plan Note (Signed)
Controlled, no change in medication DASH diet and commitment to daily physical activity for a minimum of 30 minutes discussed and encouraged, as a part of hypertension management. The importance of attaining a healthy weight is also discussed.  
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
## 2013-07-02 LAB — CBC WITH DIFFERENTIAL/PLATELET
BASOS ABS: 0.1 10*3/uL (ref 0.0–0.1)
Basophils Relative: 1 % (ref 0–1)
EOS PCT: 4 % (ref 0–5)
Eosinophils Absolute: 0.3 10*3/uL (ref 0.0–0.7)
HCT: 40.1 % (ref 36.0–46.0)
Hemoglobin: 13.7 g/dL (ref 12.0–15.0)
Lymphocytes Relative: 41 % (ref 12–46)
Lymphs Abs: 3.4 10*3/uL (ref 0.7–4.0)
MCH: 30.2 pg (ref 26.0–34.0)
MCHC: 34.2 g/dL (ref 30.0–36.0)
MCV: 88.3 fL (ref 78.0–100.0)
Monocytes Absolute: 0.6 10*3/uL (ref 0.1–1.0)
Monocytes Relative: 7 % (ref 3–12)
NEUTROS ABS: 3.9 10*3/uL (ref 1.7–7.7)
Neutrophils Relative %: 47 % (ref 43–77)
PLATELETS: 395 10*3/uL (ref 150–400)
RBC: 4.54 MIL/uL (ref 3.87–5.11)
RDW: 14.4 % (ref 11.5–15.5)
WBC: 8.4 10*3/uL (ref 4.0–10.5)

## 2013-07-02 LAB — COMPREHENSIVE METABOLIC PANEL
ALBUMIN: 4.1 g/dL (ref 3.5–5.2)
ALT: 19 U/L (ref 0–35)
AST: 18 U/L (ref 0–37)
Alkaline Phosphatase: 89 U/L (ref 39–117)
BUN: 15 mg/dL (ref 6–23)
CHLORIDE: 101 meq/L (ref 96–112)
CO2: 29 mEq/L (ref 19–32)
CREATININE: 0.98 mg/dL (ref 0.50–1.10)
Calcium: 9.4 mg/dL (ref 8.4–10.5)
GLUCOSE: 127 mg/dL — AB (ref 70–99)
POTASSIUM: 5 meq/L (ref 3.5–5.3)
Sodium: 139 mEq/L (ref 135–145)
Total Bilirubin: 0.4 mg/dL (ref 0.2–1.2)
Total Protein: 6.7 g/dL (ref 6.0–8.3)

## 2013-07-02 LAB — HEMOGLOBIN A1C
HEMOGLOBIN A1C: 6.8 % — AB (ref <5.7)
Mean Plasma Glucose: 148 mg/dL — ABNORMAL HIGH (ref <117)

## 2013-07-02 LAB — LIPID PANEL
Cholesterol: 226 mg/dL — ABNORMAL HIGH (ref 0–200)
HDL: 47 mg/dL (ref >39)
LDL CALC: 150 mg/dL — AB (ref 0–99)
Total CHOL/HDL Ratio: 4.8 Ratio
Triglycerides: 147 mg/dL (ref <150)
VLDL: 29 mg/dL (ref 0–40)

## 2013-07-03 LAB — TSH: TSH: 1.533 u[IU]/mL (ref 0.350–4.500)

## 2013-07-04 ENCOUNTER — Telehealth: Payer: Self-pay | Admitting: *Deleted

## 2013-07-04 MED ORDER — METFORMIN HCL 500 MG PO TABS: 500.0000 mg | ORAL_TABLET | Freq: Two times a day (BID) | ORAL | Status: DC

## 2013-07-04 NOTE — Telephone Encounter (Signed)
Pt called [facility] for Courtney to call her. [phone]

## 2013-07-04 NOTE — Addendum Note (Signed)
Addended by: Denman George B on: 07/04/2013 01:39 PM   Modules accepted: Orders

## 2013-07-05 NOTE — Telephone Encounter (Signed)
Patient aware of lab results.

## 2013-07-17 ENCOUNTER — Other Ambulatory Visit: Payer: Self-pay | Admitting: Family Medicine

## 2013-08-09 ENCOUNTER — Other Ambulatory Visit: Payer: Self-pay | Admitting: Family Medicine

## 2013-08-13 ENCOUNTER — Other Ambulatory Visit: Payer: Self-pay

## 2013-08-13 MED ORDER — LEVOTHYROXINE SODIUM 150 MCG PO TABS: ORAL_TABLET | ORAL | Status: DC

## 2013-09-10 ENCOUNTER — Other Ambulatory Visit: Payer: Self-pay | Admitting: Family Medicine

## 2013-10-05 ENCOUNTER — Other Ambulatory Visit: Payer: Self-pay | Admitting: Family Medicine

## 2013-10-11 LAB — HM DIABETES EYE EXAM

## 2013-10-30 ENCOUNTER — Telehealth: Payer: Self-pay | Admitting: Family Medicine

## 2013-10-30 NOTE — Telephone Encounter (Signed)
Patient has no labs ordered before next visit.

## 2013-11-01 ENCOUNTER — Encounter: Payer: Self-pay | Admitting: Family Medicine

## 2013-11-01 ENCOUNTER — Other Ambulatory Visit: Payer: Self-pay | Admitting: Family Medicine

## 2013-11-01 ENCOUNTER — Ambulatory Visit (INDEPENDENT_AMBULATORY_CARE_PROVIDER_SITE_OTHER): Payer: BC Managed Care – PPO | Admitting: Family Medicine

## 2013-11-01 ENCOUNTER — Encounter (INDEPENDENT_AMBULATORY_CARE_PROVIDER_SITE_OTHER): Payer: Self-pay

## 2013-11-01 VITALS — BP 118/80 | HR 88 | Resp 16 | Ht 69.0 in | Wt 303.4 lb

## 2013-11-01 DIAGNOSIS — I10 Essential (primary) hypertension: Secondary | ICD-10-CM

## 2013-11-01 DIAGNOSIS — E039 Hypothyroidism, unspecified: Secondary | ICD-10-CM

## 2013-11-01 DIAGNOSIS — Z23 Encounter for immunization: Secondary | ICD-10-CM

## 2013-11-01 DIAGNOSIS — M5416 Radiculopathy, lumbar region: Secondary | ICD-10-CM

## 2013-11-01 DIAGNOSIS — K219 Gastro-esophageal reflux disease without esophagitis: Secondary | ICD-10-CM

## 2013-11-01 DIAGNOSIS — F411 Generalized anxiety disorder: Secondary | ICD-10-CM

## 2013-11-01 DIAGNOSIS — E119 Type 2 diabetes mellitus without complications: Secondary | ICD-10-CM

## 2013-11-01 DIAGNOSIS — E785 Hyperlipidemia, unspecified: Secondary | ICD-10-CM

## 2013-11-01 DIAGNOSIS — IMO0001 Reserved for inherently not codable concepts without codable children: Secondary | ICD-10-CM

## 2013-11-01 LAB — COMPLETE METABOLIC PANEL WITH GFR
ALK PHOS: 84 U/L (ref 39–117)
ALT: 18 U/L (ref 0–35)
AST: 18 U/L (ref 0–37)
Albumin: 3.9 g/dL (ref 3.5–5.2)
BUN: 21 mg/dL (ref 6–23)
CO2: 24 mEq/L (ref 19–32)
Calcium: 9.7 mg/dL (ref 8.4–10.5)
Chloride: 100 mEq/L (ref 96–112)
Creat: 1.03 mg/dL (ref 0.50–1.10)
GFR, Est African American: 69 mL/min
GFR, Est Non African American: 60 mL/min
Glucose, Bld: 119 mg/dL — ABNORMAL HIGH (ref 70–99)
POTASSIUM: 4.3 meq/L (ref 3.5–5.3)
Sodium: 137 mEq/L (ref 135–145)
Total Bilirubin: 0.5 mg/dL (ref 0.2–1.2)
Total Protein: 6.6 g/dL (ref 6.0–8.3)

## 2013-11-01 LAB — MICROALBUMIN / CREATININE URINE RATIO
Creatinine, Urine: 84.9 mg/dL
Microalb, Ur: <0.2 mg/dL (ref <2.0)

## 2013-11-01 LAB — LIPID PANEL
CHOL/HDL RATIO: 4.9 ratio
CHOLESTEROL: 237 mg/dL — AB (ref 0–200)
HDL: 48 mg/dL (ref >39)
LDL Cholesterol: 149 mg/dL — ABNORMAL HIGH (ref 0–99)
Triglycerides: 198 mg/dL — ABNORMAL HIGH (ref <150)
VLDL: 40 mg/dL (ref 0–40)

## 2013-11-01 LAB — HEMOGLOBIN A1C
HEMOGLOBIN A1C: 6.8 % — AB (ref <5.7)
Mean Plasma Glucose: 148 mg/dL — ABNORMAL HIGH (ref <117)

## 2013-11-01 LAB — TSH: TSH: 1.73 u[IU]/mL (ref 0.350–4.500)

## 2013-11-01 MED ORDER — ALPRAZOLAM 0.25 MG PO TABS: ORAL_TABLET | ORAL | Status: DC

## 2013-11-01 MED ORDER — OXYCODONE-ACETAMINOPHEN 5-325 MG PO TABS: ORAL_TABLET | ORAL | Status: DC

## 2013-11-01 NOTE — Patient Instructions (Addendum)
F/u in 4.5 months  , call if you need me before  Flu vaccine today  Thankful you have got disability\  Increase in dose of pain med over the next 4 months as colder  Months are more difficult for you.  Hope your son improves, , limited amt of xanax for anxiety  Microalb today  Sign for eye exam  Lipid, cmp and EGFR,  HBA1C, TSH today, ALSO MICROALB FROM OFFICE   fASTING LIPID, CMP AND egfr, HbaQC AND Tsh IN 4.5 MONTH  NEW FOR ANXIETY IS XANAX IN LIMITED SUPPLY  LIMIT IBUPROFEN TO 3  TABLETS PER MONTH FOR UNCONTROLLED PAIN  INCREASE OVER WINTER MONTHS IN OXYCODONE AS DISCUSSED

## 2013-11-02 DIAGNOSIS — Z23 Encounter for immunization: Secondary | ICD-10-CM | POA: Insufficient documentation

## 2013-11-02 NOTE — Assessment & Plan Note (Signed)
Controlled, no change in medication  

## 2013-11-02 NOTE — Assessment & Plan Note (Signed)
Vaccine administered at visit.  

## 2013-11-02 NOTE — Assessment & Plan Note (Signed)
Increased as Winter approaches, slight increase in pain meds over next several monhts, excellent benefit from gabapentin, weight gain s/e an issue however will work on this

## 2013-11-02 NOTE — Assessment & Plan Note (Signed)
Deteriorated. Patient re-educated about  the importance of commitment to a  minimum of 150 minutes of exercise per week. The importance of healthy food choices with portion control discussed. Encouraged to start a food diary, count calories and to consider  joining a support group. Sample diet sheets offered. Goals set by the patient for the next several months.    

## 2013-11-02 NOTE — Assessment & Plan Note (Signed)
Controlled, no change in medication Patient advised to reduce carb and sweets, commit to regular physical activity, take meds as prescribed, test blood as directed, and attempt to lose weight, to improve blood sugar control.  

## 2013-11-02 NOTE — Progress Notes (Signed)
Subjective:    Patient ID: [name], female    DOB: 14-May-1955, 58 y.o.   MRN: [id number]  HPI The PT is here for follow up and re-evaluation of chronic medical conditions, medication management and review of any available recent lab and radiology data.  Preventive health is updated, specifically  Cancer screening and Immunization.   Questions or concerns regarding consultations or procedures which the PT has had in the interim are  addressed. The PT denies any adverse reactions to current medications since the last visit.  Increased back pain with weather change per usual, request  Dose adjustment in pain med 2 day h/o right ear pressure, no drainage, hearing loss, fever or chills , increased post nasal drip and sinu drainage and nasal drainage in past week Increased anxiety due t ill health of her son, denies de[pression symptoms has been awarded disability which has helped a lot with this      Review of Systems See HPI Denies recent fever or chills. Denies sinus pressure, nasal congestion,  Denies chest congestion, productive cough or wheezing. Denies chest pains, palpitations and leg swelling Denies abdominal pain, nausea, vomiting,diarrhea or constipation.   Denies dysuria, frequency, hesitancy or incontinence. . Denies headaches, seizures, .  Denies skin break down or rash.        Objective:   Physical Exam BP 118/80  Pulse 88  Resp 16  Ht 5\' 9"  (1.753 m)  Wt 303 lb 6.4 oz (137.621 kg)  BMI 44.78 kg/m2  SpO2 96% Patient alert and oriented and in no cardiopulmonary distress.  HEENT: No facial asymmetry, EOMI,   oropharynx pink and moist.  Neck supple no JVD, no mass.  Chest: Clear to auscultation bilaterally.  CVS: S1, S2 no murmurs, no S3.Regular rate.  ABD: Soft non tender.   Ext: No edema  MS: Adequate though reduced  ROM spine, adequate in  shoulders, hips and knees.  Skin: Intact, no ulcerations or rash noted.  Psych: Good eye contact,  normal affect. Memory intact not anxious or depressed appearing.  CNS: CN 2-12 intact, power,  normal throughout.no focal deficits noted.        Assessment & Plan:  Hypertension goal BP (blood pressure) < 130/80 Controlled, no change in medication DASH diet and commitment to daily physical activity for a minimum of 30 minutes discussed and encouraged, as a part of hypertension management. The importance of attaining a healthy weight is also discussed.   Diabetes type 2, controlled Controlled, no change in medication Patient advised to reduce carb and sweets, commit to regular physical activity, take meds as prescribed, test blood as directed, and attempt to lose weight, to improve blood sugar control.    Hypothyroidism Controlled, no change in medication   Anxiety state Uncontrolled due to ill health of her son, limited quantity of low dose xanax over next several months, not interested in counselling  Hyperlipidemia Uncontrolled, needs to adhere to prescribed med or change in med recommended along with dietary change  Morbid obesity Deteriorated. Patient re-educated about  the importance of commitment to a  minimum of 150 minutes of exercise per week. The importance of healthy food choices with portion control discussed. Encouraged to start a food diary, count calories and to consider  joining a support group. Sample diet sheets offered. Goals set by the patient for the next several months.     Radicular pain of right lower back Increased as Winter approaches, slight increase in pain meds over next several  monhts, excellent benefit from gabapentin, weight gain s/e an issue however will work on this  GERD Controlled, no change in medication   Need for prophylactic vaccination and inoculation against influenza Vaccine administered at visit.

## 2013-11-02 NOTE — Assessment & Plan Note (Signed)
Controlled, no change in medication DASH diet and commitment to daily physical activity for a minimum of 30 minutes discussed and encouraged, as a part of hypertension management. The importance of attaining a healthy weight is also discussed.  

## 2013-11-02 NOTE — Assessment & Plan Note (Signed)
Uncontrolled due to ill health of her son, limited quantity of low dose xanax over next several months, not interested in counselling

## 2013-11-02 NOTE — Assessment & Plan Note (Signed)
Uncontrolled, needs to adhere to prescribed med or change in med recommended along with dietary change

## 2013-11-07 ENCOUNTER — Other Ambulatory Visit: Payer: Self-pay

## 2013-11-07 MED ORDER — ROSUVASTATIN CALCIUM 20 MG PO TABS: 20.0000 mg | ORAL_TABLET | Freq: Every day | ORAL | Status: DC

## 2013-12-10 ENCOUNTER — Telehealth: Payer: Self-pay

## 2013-12-10 ENCOUNTER — Telehealth: Payer: Self-pay | Admitting: Family Medicine

## 2013-12-10 NOTE — Telephone Encounter (Signed)
Attempted to call patient back - unable to leave voicemail.   

## 2013-12-11 ENCOUNTER — Ambulatory Visit (INDEPENDENT_AMBULATORY_CARE_PROVIDER_SITE_OTHER): Payer: BC Managed Care – PPO | Admitting: Family Medicine

## 2013-12-11 ENCOUNTER — Encounter: Payer: Self-pay | Admitting: Family Medicine

## 2013-12-11 VITALS — BP 120/82 | HR 90 | Temp 98.3°F | Resp 18 | Ht 69.0 in | Wt 309.0 lb

## 2013-12-11 DIAGNOSIS — E038 Other specified hypothyroidism: Secondary | ICD-10-CM

## 2013-12-11 DIAGNOSIS — I10 Essential (primary) hypertension: Secondary | ICD-10-CM

## 2013-12-11 DIAGNOSIS — J019 Acute sinusitis, unspecified: Secondary | ICD-10-CM | POA: Insufficient documentation

## 2013-12-11 DIAGNOSIS — J209 Acute bronchitis, unspecified: Secondary | ICD-10-CM

## 2013-12-11 DIAGNOSIS — J0100 Acute maxillary sinusitis, unspecified: Secondary | ICD-10-CM

## 2013-12-11 DIAGNOSIS — E119 Type 2 diabetes mellitus without complications: Secondary | ICD-10-CM

## 2013-12-11 DIAGNOSIS — E785 Hyperlipidemia, unspecified: Secondary | ICD-10-CM

## 2013-12-11 MED ORDER — LEVOFLOXACIN 500 MG PO TABS: 500.0000 mg | ORAL_TABLET | Freq: Every day | ORAL | Status: DC

## 2013-12-11 MED ORDER — FLUCONAZOLE 150 MG PO TABS: ORAL_TABLET | ORAL | Status: DC

## 2013-12-11 NOTE — Progress Notes (Signed)
   Subjective:    Patient ID: [name], female    DOB: 05-30-1955, 58 y.o.   MRN: [id number]  HPI 4 day  h/o worsening head and chest congestion, associated with fever and chills intermittently. Nasal drainage has thickened , and is yellowish green, and at times bloody. Sputum is thick and yellow. C/o bilateral ear pressure, denies hearing loss and sore throat. Increasing fatigue , poor appetitie and sleep disturbed by cough. No improvement with OTC medication.    Review of Systems See HPI Chest pain associated with cough, denies PND, orthopnea, leg swelling or palpitation Denies dysuria or frequency, incontinence with coughing spells Psych: denies depression or anxiety    Objective:   Physical Exam BP 120/82 mmHg  Pulse 90  Temp(Src) 98.3 F (36.8 C)  Resp 18  Ht 5\' 9"  (1.753 m)  Wt 309 lb (140.161 kg)  BMI 45.61 kg/m2  SpO2 98%  Patient alert and oriented and in no cardiopulmonary distress.Ill appearing  HEENT: No facial asymmetry, EOMI,   oropharynx pink and moist.  Neck supple no JVD, bilateral anterior cervical e adenitis Bilateral maxillary sinus tenderness, TM clear bilaterally Chest: bibasilar crackles, no wheezes, adequate air entry   CVS: S1, S2 no murmurs, no S3.Regular rate.  ABD: Soft non tender.   Ext: No edema  MS: decreased ROM spine, adequate in shoulders, hips and knees.       Assessment & Plan:  Acute maxillary sinusitis levaquin prescribed, also fluconazole if needed, call if not better in 10 days or if worsening  Acute bronchitis Decongestant and antibiotic prescribed  Hypothyroidism Controlled, no change in medication Updated lab needed at/ before next visit.   Hypertension goal BP (blood pressure) < 130/80 Controlled, no change in medication

## 2013-12-11 NOTE — Patient Instructions (Signed)
F/u as before, call if you need me sooner  Fasting lipid, cm and EGFr, hBA1C and TSH SECOND WEEK IN JAN  You are treated for acute sinusitis and bronchitis, use decongestants you already have, script given to you for levaquin for 1 week and fluconazole if needed for vaginal yeast infection from antibiotic use  All the best for season and 2016!

## 2013-12-11 NOTE — Telephone Encounter (Signed)
Called and spoke with patient.   She was given appointment for this afternoon due to sinus pressure, cough, congestion, and ear pain.

## 2013-12-12 NOTE — Telephone Encounter (Signed)
Patient is aware 

## 2014-01-08 ENCOUNTER — Other Ambulatory Visit: Payer: Self-pay | Admitting: Family Medicine

## 2014-01-14 ENCOUNTER — Ambulatory Visit (INDEPENDENT_AMBULATORY_CARE_PROVIDER_SITE_OTHER): Payer: BLUE CROSS/BLUE SHIELD | Admitting: Family Medicine

## 2014-01-14 ENCOUNTER — Ambulatory Visit (HOSPITAL_COMMUNITY)
Admission: RE | Admit: 2014-01-14 | Discharge: 2014-01-14 | Disposition: A | Discharge status: Home / Self Care | Payer: Medicare Other | Source: Ambulatory Visit | Attending: Family Medicine | Admitting: Family Medicine

## 2014-01-14 ENCOUNTER — Encounter (INDEPENDENT_AMBULATORY_CARE_PROVIDER_SITE_OTHER): Payer: Self-pay

## 2014-01-14 ENCOUNTER — Encounter: Payer: Self-pay | Admitting: Family Medicine

## 2014-01-14 VITALS — BP 112/80 | HR 72 | Resp 16 | Ht 68.0 in | Wt 310.8 lb

## 2014-01-14 DIAGNOSIS — M5416 Radiculopathy, lumbar region: Secondary | ICD-10-CM

## 2014-01-14 DIAGNOSIS — M7989 Other specified soft tissue disorders: Secondary | ICD-10-CM | POA: Diagnosis present

## 2014-01-14 DIAGNOSIS — M79671 Pain in right foot: Secondary | ICD-10-CM

## 2014-01-14 DIAGNOSIS — IMO0001 Reserved for inherently not codable concepts without codable children: Secondary | ICD-10-CM

## 2014-01-14 DIAGNOSIS — I10 Essential (primary) hypertension: Secondary | ICD-10-CM

## 2014-01-14 NOTE — Patient Instructions (Addendum)
Please re sched March appt  You are referred for xray of right foot today

## 2014-01-22 NOTE — Assessment & Plan Note (Signed)
Cystic lesion on dorsum of right foot at times tender, will order xray, clinically this is a ganglion cyst and I discussed with pt , no need for removal unless bothersome, also made it clear tha tunable to guarantee benign pathology without biopsy, she plans to further monitor

## 2014-01-22 NOTE — Assessment & Plan Note (Signed)
Controlled, no change in medication  

## 2014-01-22 NOTE — Assessment & Plan Note (Signed)
Controlled, no change in medication DASH diet and commitment to daily physical activity for a minimum of 30 minutes discussed and encouraged, as a part of hypertension management. The importance of attaining a healthy weight is also discussed.  

## 2014-01-22 NOTE — Progress Notes (Signed)
   Subjective:    Patient ID: [name], female    DOB: 11/22/1955, 58 y.o.   MRN: [id number]  HPI 1 month h/o tender swelling on dorsum of right foot, no h/o trauma, able to weight bear per routine, njo drainage from lesion Otherwise well  Review of Systems See HPI Denies recent fever or chills. Denies sinus pressure, nasal congestion, ear pain or sore throat. Denies chest congestion, productive cough or wheezing. Denies chest pains, palpitations and leg swelling  Chronic back pain and limitation in mobility unchanged        Objective:   Physical Exam BP 112/80 mmHg  Pulse 72  Resp 16  Ht 5\' 8"  (1.727 m)  Wt 310 lb 12.8 oz (140.978 kg)  BMI 47.27 kg/m2  SpO2 96% Patient alert and oriented and in no cardiopulmonary distress.  HEENT: No facial asymmetry, EOMI,   oropharynx pink and moist.  Neck supple no JVD, no mass.  Chest: Clear to auscultation bilaterally.  CVS: S1, S2 no murmurs, no S3.Regular rate.  ABD: Soft non tender.   Ext: No edema  MS: Adequate though reduced  ROM spine, shoulders, hips and knees.ganglion cyst on dorsum of foot, no erythema or drainage  Skin: Intact, no ulcerations or rash noted.  Psych: Good eye contact, normal affect. Memory intact not anxious or depressed appearing.  CNS: CN 2-12 intact, power,  normal throughout.no focal deficits noted.        Assessment & Plan:  Foot pain, right Cystic lesion on dorsum of right foot at times tender, will order xray, clinically this is a ganglion cyst and I discussed with pt , no need for removal unless bothersome, also made it clear tha tunable to guarantee benign pathology without biopsy, she plans to further monitor  Hypertension goal BP (blood pressure) < 130/80 Controlled, no change in medication DASH diet and commitment to daily physical activity for a minimum of 30 minutes discussed and encouraged, as a part of hypertension management. The importance of attaining a healthy weight  is also discussed.   Radicular pain of right lower back Controlled, no change in medication

## 2014-01-28 DIAGNOSIS — J209 Acute bronchitis, unspecified: Secondary | ICD-10-CM | POA: Insufficient documentation

## 2014-01-28 DIAGNOSIS — J0100 Acute maxillary sinusitis, unspecified: Secondary | ICD-10-CM | POA: Insufficient documentation

## 2014-01-28 NOTE — Assessment & Plan Note (Signed)
levaquin prescribed, also fluconazole if needed, call if not better in 10 days or if worsening

## 2014-01-28 NOTE — Assessment & Plan Note (Signed)
Decongestant and antibiotic prescribed 

## 2014-01-28 NOTE — Assessment & Plan Note (Signed)
Controlled, no change in medication  

## 2014-01-28 NOTE — Assessment & Plan Note (Signed)
Controlled, no change in medication Updated lab needed at/ before next visit.  

## 2014-02-05 LAB — COMPLETE METABOLIC PANEL WITH GFR
ALT: 23 U/L (ref 0–35)
AST: 24 U/L (ref 0–37)
Albumin: 4 g/dL (ref 3.5–5.2)
Alkaline Phosphatase: 88 U/L (ref 39–117)
BILIRUBIN TOTAL: 0.5 mg/dL (ref 0.2–1.2)
BUN: 17 mg/dL (ref 6–23)
CO2: 30 mEq/L (ref 19–32)
CREATININE: 1.02 mg/dL (ref 0.50–1.10)
Calcium: 9.7 mg/dL (ref 8.4–10.5)
Chloride: 99 mEq/L (ref 96–112)
GFR, EST NON AFRICAN AMERICAN: 61 mL/min
GFR, Est African American: 70 mL/min
Glucose, Bld: 142 mg/dL — ABNORMAL HIGH (ref 70–99)
Potassium: 4.7 mEq/L (ref 3.5–5.3)
Sodium: 138 mEq/L (ref 135–145)
Total Protein: 6.5 g/dL (ref 6.0–8.3)

## 2014-02-05 LAB — LIPID PANEL
CHOL/HDL RATIO: 5 ratio
Cholesterol: 246 mg/dL — ABNORMAL HIGH (ref 0–200)
HDL: 49 mg/dL (ref >39)
LDL Cholesterol: 160 mg/dL — ABNORMAL HIGH (ref 0–99)
Triglycerides: 183 mg/dL — ABNORMAL HIGH (ref <150)
VLDL: 37 mg/dL (ref 0–40)

## 2014-02-05 LAB — HEMOGLOBIN A1C
HEMOGLOBIN A1C: 7.1 % — AB (ref <5.7)
Mean Plasma Glucose: 157 mg/dL — ABNORMAL HIGH (ref <117)

## 2014-02-05 LAB — TSH: TSH: 3.257 u[IU]/mL (ref 0.350–4.500)

## 2014-02-08 ENCOUNTER — Other Ambulatory Visit: Payer: Self-pay

## 2014-02-08 ENCOUNTER — Telehealth: Payer: Self-pay

## 2014-02-08 MED ORDER — PREDNISONE 5 MG PO TABS: 5.0000 mg | ORAL_TABLET | Freq: Two times a day (BID) | ORAL | Status: DC

## 2014-02-08 NOTE — Telephone Encounter (Signed)
Send prednisone 5mg  twice daily # 10 only pls

## 2014-02-08 NOTE — Telephone Encounter (Signed)
States for the past week she has been itching on the right side of her face and her right hand. Both are red and a little swollen. Thought is was her nerves so she has been taking her nerve pills and she has also tried benadryl without much relief. No new foods recently or meds. Wants to know if you can call in something for her to [name]. (aware if her lips start tingling or tongue starts to feel strange to go to ER)

## 2014-02-08 NOTE — Telephone Encounter (Signed)
Med sent and patient aware 

## 2014-02-12 ENCOUNTER — Telehealth: Payer: Self-pay

## 2014-02-12 NOTE — Telephone Encounter (Addendum)
She can get vytorin10/40  free from health dept so will send in for that, she needs to start metformin 500mg  one twice daily pls send in, pls enter both in her record

## 2014-02-13 NOTE — Telephone Encounter (Signed)
Patient aware and forms sent back to health department

## 2014-03-07 ENCOUNTER — Other Ambulatory Visit: Payer: Self-pay

## 2014-03-07 DIAGNOSIS — F411 Generalized anxiety disorder: Secondary | ICD-10-CM

## 2014-03-07 MED ORDER — BENAZEPRIL HCL 20 MG PO TABS: 20.0000 mg | ORAL_TABLET | Freq: Every day | ORAL | Status: DC

## 2014-03-07 MED ORDER — ALPRAZOLAM 0.25 MG PO TABS: ORAL_TABLET | ORAL | Status: DC

## 2014-03-07 MED ORDER — GABAPENTIN 300 MG PO CAPS: 300.0000 mg | ORAL_CAPSULE | Freq: Three times a day (TID) | ORAL | Status: DC

## 2014-03-07 MED ORDER — CYCLOBENZAPRINE HCL 10 MG PO TABS
ORAL_TABLET | ORAL | Status: DC
Start: 2014-03-07 — End: 2014-08-28

## 2014-03-07 MED ORDER — HYDROCHLOROTHIAZIDE 12.5 MG PO CAPS: 12.5000 mg | ORAL_CAPSULE | Freq: Every day | ORAL | Status: DC

## 2014-04-02 ENCOUNTER — Ambulatory Visit (INDEPENDENT_AMBULATORY_CARE_PROVIDER_SITE_OTHER): Payer: BLUE CROSS/BLUE SHIELD | Admitting: Family Medicine

## 2014-04-02 DIAGNOSIS — I10 Essential (primary) hypertension: Secondary | ICD-10-CM

## 2014-04-09 LAB — LIPID PANEL
CHOL/HDL RATIO: 5 ratio
Cholesterol: 218 mg/dL — ABNORMAL HIGH (ref 0–200)
HDL: 44 mg/dL — AB (ref >=46)
LDL Cholesterol: 135 mg/dL — ABNORMAL HIGH (ref 0–99)
Triglycerides: 195 mg/dL — ABNORMAL HIGH (ref <150)
VLDL: 39 mg/dL (ref 0–40)

## 2014-04-09 LAB — COMPLETE METABOLIC PANEL WITH GFR
ALT: 20 U/L (ref 0–35)
AST: 17 U/L (ref 0–37)
Albumin: 3.9 g/dL (ref 3.5–5.2)
Alkaline Phosphatase: 80 U/L (ref 39–117)
BUN: 18 mg/dL (ref 6–23)
CALCIUM: 9.7 mg/dL (ref 8.4–10.5)
CHLORIDE: 103 meq/L (ref 96–112)
CO2: 24 meq/L (ref 19–32)
CREATININE: 0.99 mg/dL (ref 0.50–1.10)
GFR, Est African American: 73 mL/min
GFR, Est Non African American: 63 mL/min
Glucose, Bld: 126 mg/dL — ABNORMAL HIGH (ref 70–99)
Potassium: 5.2 mEq/L (ref 3.5–5.3)
Sodium: 139 mEq/L (ref 135–145)
Total Bilirubin: 0.4 mg/dL (ref 0.2–1.2)
Total Protein: 6.4 g/dL (ref 6.0–8.3)

## 2014-04-09 LAB — HEMOGLOBIN A1C
Hgb A1c MFr Bld: 7 % — ABNORMAL HIGH (ref <5.7)
MEAN PLASMA GLUCOSE: 154 mg/dL — AB (ref <117)

## 2014-04-09 LAB — TSH: TSH: 1.232 u[IU]/mL (ref 0.350–4.500)

## 2014-04-11 ENCOUNTER — Ambulatory Visit (INDEPENDENT_AMBULATORY_CARE_PROVIDER_SITE_OTHER): Payer: BLUE CROSS/BLUE SHIELD | Admitting: Family Medicine

## 2014-04-11 ENCOUNTER — Encounter: Payer: Self-pay | Admitting: Family Medicine

## 2014-04-11 VITALS — BP 110/74 | HR 83 | Resp 16 | Ht 68.0 in | Wt 302.0 lb

## 2014-04-11 DIAGNOSIS — F411 Generalized anxiety disorder: Secondary | ICD-10-CM

## 2014-04-11 DIAGNOSIS — Z23 Encounter for immunization: Secondary | ICD-10-CM | POA: Diagnosis not present

## 2014-04-11 DIAGNOSIS — M545 Low back pain, unspecified: Secondary | ICD-10-CM

## 2014-04-11 DIAGNOSIS — K219 Gastro-esophageal reflux disease without esophagitis: Secondary | ICD-10-CM

## 2014-04-11 DIAGNOSIS — I10 Essential (primary) hypertension: Secondary | ICD-10-CM

## 2014-04-11 DIAGNOSIS — E038 Other specified hypothyroidism: Secondary | ICD-10-CM | POA: Insufficient documentation

## 2014-04-11 DIAGNOSIS — M5440 Lumbago with sciatica, unspecified side: Secondary | ICD-10-CM

## 2014-04-11 DIAGNOSIS — Z1159 Encounter for screening for other viral diseases: Secondary | ICD-10-CM

## 2014-04-11 DIAGNOSIS — E119 Type 2 diabetes mellitus without complications: Secondary | ICD-10-CM

## 2014-04-11 DIAGNOSIS — E8881 Metabolic syndrome: Secondary | ICD-10-CM

## 2014-04-11 DIAGNOSIS — Z1211 Encounter for screening for malignant neoplasm of colon: Secondary | ICD-10-CM

## 2014-04-11 DIAGNOSIS — E785 Hyperlipidemia, unspecified: Secondary | ICD-10-CM

## 2014-04-11 NOTE — Patient Instructions (Addendum)
Welcome to [facility] July 16 or after  Rectal exam today  Prevnar today  Fasting lipid, cmp and EGFR, HBA1C, tSH, Vit D , HIVJuly 14 or after  Use neosporin on left ear lobe twice daily for 5 days , call back if still a problem I will refer you to dermatology for furher evaluation  I DO recommend you take vytorin, labs are improving as you change your eating  Reduced oxycodone dose with warn wether as discussed

## 2014-04-11 NOTE — Progress Notes (Signed)
   Subjective:    Patient ID: [name], female    DOB: [birth year], 59 y.o.   MRN: [id number]  HPI The PT is here for follow up and re-evaluation of chronic medical conditions, medication management and review of any available recent lab and radiology data.  Preventive health is updated, specifically  Cancer screening and Immunization.   Questions or concerns regarding consultations or procedures which the PT has had in the interim are  addressed. The PT denies any adverse reactions to current medications since the last visit.  There are no new concerns.  There are no specific complaints  Has worked on dietary change with good success, still needs to take prescription med for her diabetes and hyperlipidemia as prescribed, re educated about this Denies polyuria, polydipsia, blurred vision , or hypoglycemic episodes.      Review of Systems See HPI Denies recent fever or chills. Denies sinus pressure, nasal congestion, ear pain or sore throat. Denies chest congestion, productive cough or wheezing. Denies chest pains, palpitations and leg swelling Denies abdominal pain, nausea, vomiting,diarrhea or constipation.   Denies dysuria, frequency, hesitancy or incontinence. Denies uncontrolled  joint pain, swelling and limitation in mobility. Denies headaches, seizures, numbness, or tingling. Denies depression, anxiety or insomnia. Denies skin break down or rash.        Objective:   Physical Exam BP 110/74 mmHg  Pulse 83  Resp 16  Ht 5\' 8"  (1.727 m)  Wt 302 lb (136.986 kg)  BMI 45.93 kg/m2  SpO2 96% Patient alert and oriented and in no cardiopulmonary distress.  HEENT: No facial asymmetry, EOMI,   oropharynx pink and moist.  Neck supple no JVD, no mass.  Chest: Clear to auscultation bilaterally.  CVS: S1, S2 no murmurs, no S3.Regular rate.  ABD: Soft non tender.   Ext: No edema  MS: Adequate ROM spine, shoulders, hips and knees.  Skin: Intact, no ulcerations or rash  noted.  Psych: Good eye contact, normal affect. Memory intact not anxious or depressed appearing.  CNS: CN 2-12 intact, power,  normal throughout.no focal deficits noted.        Assessment & Plan:  Hypertension goal BP (blood pressure) < 130/80 Controlled, no change in medication DASH diet and commitment to daily physical activity for a minimum of 30 minutes discussed and encouraged, as a part of hypertension management. The importance of attaining a healthy weight is also discussed.    Diabetes type 2, controlled Improved, pt encouraged to take metformin as prescribed , taking 50% dose prescribed Patient advised to reduce carb and sweets, commit to regular physical activity, take meds as prescribed, test blood as directed, and attempt to lose weight, to improve blood sugar control. Updated lab needed at/ before next visit.    Other specified hypothyroidism Controlled, no change in medication Updated lab needed at/ before next visit.    Anxiety state Reports recent increase in anxiety due to family issues  , but states this has improved, no change in med dose   Morbid obesity Improved. Pt applauded on succesful weight loss through lifestyle change, and encouraged to continue same. Weight loss goal set for the next several months.    Low back pain with radiation Less symptomatic with warm weather, reduce hydrocodone dosing accordingly   GERD Improved, uses medication as needed   Need for vaccination with 13-polyvalent pneumococcal conjugate vaccine After obtaining informed consent, the vaccine is  administered by LPN

## 2014-04-12 DIAGNOSIS — Z23 Encounter for immunization: Secondary | ICD-10-CM | POA: Insufficient documentation

## 2014-04-12 NOTE — Assessment & Plan Note (Signed)
Less symptomatic with warm weather, reduce hydrocodone dosing accordingly

## 2014-04-12 NOTE — Assessment & Plan Note (Signed)
Improved, pt encouraged to take metformin as prescribed , taking 50% dose prescribed Patient advised to reduce carb and sweets, commit to regular physical activity, take meds as prescribed, test blood as directed, and attempt to lose weight, to improve blood sugar control. Updated lab needed at/ before next visit.

## 2014-04-12 NOTE — Assessment & Plan Note (Signed)
Reports recent increase in anxiety due to family issues  , but states this has improved, no change in med dose

## 2014-04-12 NOTE — Assessment & Plan Note (Signed)
Controlled, no change in medication Updated lab needed at/ before next visit.  

## 2014-04-12 NOTE — Assessment & Plan Note (Signed)
Improved, uses medication as needed 

## 2014-04-12 NOTE — Assessment & Plan Note (Signed)
After obtaining informed consent, the vaccine is  administered by LPN.  

## 2014-04-12 NOTE — Assessment & Plan Note (Signed)
Controlled, no change in medication DASH diet and commitment to daily physical activity for a minimum of 30 minutes discussed and encouraged, as a part of hypertension management. The importance of attaining a healthy weight is also discussed.  

## 2014-04-12 NOTE — Assessment & Plan Note (Signed)
Improved. Pt applauded on succesful weight loss through lifestyle change, and encouraged to continue same. Weight loss goal set for the next several months.  

## 2014-04-15 LAB — POC HEMOCCULT BLD/STL (OFFICE/1-CARD/DIAGNOSTIC): Fecal Occult Blood, POC: NEGATIVE

## 2014-04-15 NOTE — Addendum Note (Signed)
Addended by: Eual Fines on: 04/15/2014 07:43 AM   Modules accepted: Orders

## 2014-04-23 ENCOUNTER — Other Ambulatory Visit: Payer: Self-pay | Admitting: Family Medicine

## 2014-04-23 ENCOUNTER — Telehealth: Payer: Self-pay

## 2014-04-23 MED ORDER — METHOCARBAMOL 500 MG PO TABS: 500.0000 mg | ORAL_TABLET | Freq: Three times a day (TID) | ORAL | Status: DC

## 2014-04-23 MED ORDER — PREDNISONE (PAK) 5 MG PO TABS: 5.0000 mg | ORAL_TABLET | ORAL | Status: DC

## 2014-04-23 MED ORDER — IBUPROFEN 800 MG PO TABS: 800.0000 mg | ORAL_TABLET | Freq: Three times a day (TID) | ORAL | Status: DC

## 2014-04-23 NOTE — Telephone Encounter (Signed)
Muscle relaxant and prednisone  Dose pack  Sent  In  And ibuprofen short term, she will come in am 10 ocliock for nurse visit  Pls give toradol 60mg  and depo medrol 80 mg im, I told her to come at 10 am

## 2014-04-24 NOTE — Telephone Encounter (Signed)
noted 

## 2014-04-24 NOTE — Telephone Encounter (Signed)
Patient called stating that she did not have a ride to come in today

## 2014-04-28 NOTE — Progress Notes (Signed)
   Subjective:    Patient ID: [name], female    DOB: Nov 05, 1955, 59 y.o.   MRN: [id number]  HPI    Review of Systems     Objective:   Physical Exam        Assessment & Plan:  Pt did not keep appt

## 2014-05-02 ENCOUNTER — Other Ambulatory Visit: Payer: Self-pay

## 2014-05-02 MED ORDER — OXYCODONE-ACETAMINOPHEN 5-325 MG PO TABS: ORAL_TABLET | ORAL | Status: DC

## 2014-05-07 ENCOUNTER — Other Ambulatory Visit: Payer: Self-pay | Admitting: Family Medicine

## 2014-06-08 ENCOUNTER — Other Ambulatory Visit: Payer: Self-pay | Admitting: Family Medicine

## 2014-07-08 ENCOUNTER — Other Ambulatory Visit: Payer: Self-pay | Admitting: Family Medicine

## 2014-08-08 ENCOUNTER — Other Ambulatory Visit: Payer: Self-pay | Admitting: Family Medicine

## 2014-08-28 ENCOUNTER — Ambulatory Visit (INDEPENDENT_AMBULATORY_CARE_PROVIDER_SITE_OTHER): Payer: Medicare Other | Admitting: Family Medicine

## 2014-08-28 ENCOUNTER — Encounter: Payer: Self-pay | Admitting: Family Medicine

## 2014-08-28 VITALS — BP 100/70 | HR 86 | Resp 18 | Ht 68.0 in | Wt 297.0 lb

## 2014-08-28 DIAGNOSIS — M5442 Lumbago with sciatica, left side: Secondary | ICD-10-CM

## 2014-08-28 DIAGNOSIS — IMO0001 Reserved for inherently not codable concepts without codable children: Secondary | ICD-10-CM

## 2014-08-28 DIAGNOSIS — Z0000 Encounter for general adult medical examination without abnormal findings: Secondary | ICD-10-CM | POA: Insufficient documentation

## 2014-08-28 DIAGNOSIS — E038 Other specified hypothyroidism: Secondary | ICD-10-CM

## 2014-08-28 DIAGNOSIS — F418 Other specified anxiety disorders: Secondary | ICD-10-CM

## 2014-08-28 DIAGNOSIS — I10 Essential (primary) hypertension: Secondary | ICD-10-CM

## 2014-08-28 DIAGNOSIS — M5416 Radiculopathy, lumbar region: Secondary | ICD-10-CM

## 2014-08-28 LAB — TSH: TSH: 1.891 u[IU]/mL (ref 0.350–4.500)

## 2014-08-28 LAB — POCT URINALYSIS DIPSTICK
BILIRUBIN UA: NEGATIVE
Blood, UA: NEGATIVE
Glucose, UA: NEGATIVE
Ketones, UA: NEGATIVE
LEUKOCYTES UA: NEGATIVE
Nitrite, UA: NEGATIVE
Protein, UA: NEGATIVE
Spec Grav, UA: 1.02
Urobilinogen, UA: 0.2
pH, UA: 5.5

## 2014-08-28 LAB — COMPLETE METABOLIC PANEL WITH GFR
ALK PHOS: 84 U/L (ref 33–130)
ALT: 19 U/L (ref 6–29)
AST: 20 U/L (ref 10–35)
Albumin: 3.9 g/dL (ref 3.6–5.1)
BILIRUBIN TOTAL: 0.4 mg/dL (ref 0.2–1.2)
BUN: 17 mg/dL (ref 7–25)
CHLORIDE: 100 mmol/L (ref 98–110)
CO2: 26 mmol/L (ref 20–31)
CREATININE: 1.19 mg/dL — AB (ref 0.50–1.05)
Calcium: 9.7 mg/dL (ref 8.6–10.4)
GFR, EST AFRICAN AMERICAN: 58 mL/min — AB (ref >=60)
GFR, Est Non African American: 50 mL/min — ABNORMAL LOW (ref >=60)
Glucose, Bld: 133 mg/dL — ABNORMAL HIGH (ref 65–99)
Potassium: 4.7 mmol/L (ref 3.5–5.3)
Sodium: 141 mmol/L (ref 135–146)
Total Protein: 6.6 g/dL (ref 6.1–8.1)

## 2014-08-28 LAB — LIPID PANEL
Cholesterol: 238 mg/dL — ABNORMAL HIGH (ref 125–200)
HDL: 45 mg/dL — ABNORMAL LOW (ref >=46)
LDL CALC: 149 mg/dL — AB (ref <130)
Total CHOL/HDL Ratio: 5.3 Ratio — ABNORMAL HIGH (ref <=5.0)
Triglycerides: 219 mg/dL — ABNORMAL HIGH (ref <150)
VLDL: 44 mg/dL — AB (ref <30)

## 2014-08-28 LAB — VITAMIN D 25 HYDROXY (VIT D DEFICIENCY, FRACTURES): VIT D 25 HYDROXY: 23 ng/mL — AB (ref 30–100)

## 2014-08-28 LAB — HEMOGLOBIN A1C
HEMOGLOBIN A1C: 7 % — AB (ref <5.7)
MEAN PLASMA GLUCOSE: 154 mg/dL — AB (ref <117)

## 2014-08-28 LAB — HIV ANTIBODY (ROUTINE TESTING W REFLEX): HIV 1&2 Ab, 4th Generation: NONREACTIVE

## 2014-08-28 MED ORDER — OXYCODONE-ACETAMINOPHEN 5-325 MG PO TABS: ORAL_TABLET | ORAL | Status: DC

## 2014-08-28 MED ORDER — BENAZEPRIL HCL 5 MG PO TABS
5.0000 mg | ORAL_TABLET | Freq: Every day | ORAL | Status: DC
Start: 2014-08-28 — End: 2014-10-24

## 2014-08-28 NOTE — Assessment & Plan Note (Signed)
Untreated depression , pt non compliant,not suicidal or homicidal, bUT often wonders why she is even alive Will start medication again and return in 8 weeks Need therapy but refuses

## 2014-08-28 NOTE — Assessment & Plan Note (Signed)

## 2014-08-28 NOTE — Patient Instructions (Signed)
F/u in 2 month with depression screen, call if you need me before  PLEASE start effexor as prescribed and vytorin, xanax is discontinuied  Benazepril is new lower dose  Oxycodone direction is changed and flexeril as discussed  ALWAYS bring all meds you are taking to every visit pls  Congrats on weight loss  Please work on good  health habits so that your health will improve. 1. Commitment to daily physical activity for 30 to 60  minutes, if you are able to do this.  2. Commitment to wise food choices. Aim for half of your  food intake to be vegetable and fruit, one quarter starchy foods, and one quarter protein. Try to eat on a regular schedule  3 meals per day, snacking between meals should be limited to vegetables or fruits or small portions of nuts. 64 ounces of water per day is generally recommended, unless you have specific health conditions, like heart failure or kidney failure where you will need to limit fluid intake.  3. Commitment to sufficient and a  good quality of physical and mental rest daily, generally between 6 to 8 hours per day.  WITH PERSISTANCE AND PERSEVERANCE, THE IMPOSSIBLE , BECOMES THE NORM! Thanks for choosing [facility], we consider it a privelige to serve you.

## 2014-08-28 NOTE — Progress Notes (Signed)
Subjective:    Patient ID: [name], female    DOB: 12/07/1955, 58 y.o.   MRN: [id number]  HPI Preventive Screening-Counseling & Management   Patient present here today for a Medicare annual wellness visit.   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 Yes Over the past two weeks, have you felt little interest or pleasure in doing things? Yes Have you lost interest or pleasure in daily life? Yes Do you often feel hopeless? Yes Do you cry easily over simple problems? At times   Activities of Daily Living  In your present state of health, do you have any difficulty performing the following activities?  Driving?: No Managing money?: No Feeding yourself?:No Getting from bed to chair?:yes Climbing a flight of stairs?: Yes due to back pain Preparing food and eating?:No Bathing or showering?:No Getting dressed?:No Getting to the toilet?:No Using the toilet?:No Moving around from place to place?: Yes due to back pain , ambulates with cane  Fall Risk Assessment In the past year have you fallen or had a near fall?:yes, no falls unsteady gait Are you currently taking any medications that make you dizzy?yes   Hearing Difficulties: No Do you often ask people to speak up or repeat themselves?:No Do you experience ringing or noises in your ears?:No Do you have difficulty understanding soft or whispered voices?:No  Cognitive Testing  Alert? Yes  Normal Appearance?Yes  Oriented to person? Yes Place? Yes  Time? Yes  Displays appropriate judgment?Yes  Can read the correct time from a watch face? yes Are you having problems remembering things?No  Advanced Directives have been discussed with the patient?Yes and brochure provided full code   List the Names of Other Physician/Practitioners you currently use: careteams updated    Indicate any recent Medical Services you may have received from other than [facility] providers in the past year (date may be approximate).   Assessment:    Annual Wellness Exam   Plan:    Medicare Attestation  I have personally reviewed:  The patient's medical and social history  Their use of alcohol, tobacco or illicit drugs  Their current medications and supplements  The patient's functional ability including ADLs,fall risks, home safety risks, cognitive, and hearing and visual impairment  Diet and physical activities  Evidence for depression or mood disorders  The patient's weight, height, BMI, and visual acuity have been recorded in the chart. I have made referrals, counseling, and provided education to the patient based on review of the above and I have provided the patient with a written personalized care plan for preventive services.     Review of Systems     Objective:   Physical Exam  BP 100/70 mmHg  Pulse 86  Resp 18  Ht 5\' 8"  (1.727 m)  Wt 297 lb (134.718 kg)  BMI 45.17 kg/m2  SpO2 94%  EKG: NSR, no ischemia or LVH, rate 82, urinalysis normal Chest: clear to ascultation,  no crackles , wheezes, adequate air entry throughout  CVS: S 1 and S 2 , no S 3 , no murmur    Assessment & Plan:  Welcome to Medicare preventive visit Annual exam as documented. Counseling done  re healthy lifestyle involving commitment to 150 minutes exercise per week, heart healthy diet, and attaining healthy weight.The importance of adequate sleep also discussed. Regular seat belt use and home safety, is  also discussed. Changes in health habits are decided on by the patient with goals and time frames  set for achieving them. Immunization and cancer screening needs are specifically addressed at this visit.   Hypertension goal BP (blood pressure) < 130/80 Symptomatic hypotension, reduce dose of benazepril, follow up in 8 weeks  Depression with anxiety Untreated depression , pt non compliant,not suicidal or homicidal, bUT often wonders why she is even alive Will start medication again and return in 8 weeks Need therapy but refuses

## 2014-08-28 NOTE — Assessment & Plan Note (Signed)
Symptomatic hypotension, reduce dose of benazepril, follow up in 8 weeks

## 2014-09-20 ENCOUNTER — Other Ambulatory Visit: Payer: Self-pay

## 2014-09-20 MED ORDER — OXYCODONE-ACETAMINOPHEN 5-325 MG PO TABS: ORAL_TABLET | ORAL | Status: DC

## 2014-10-08 ENCOUNTER — Other Ambulatory Visit: Payer: Self-pay | Admitting: Family Medicine

## 2014-10-24 ENCOUNTER — Encounter: Payer: Self-pay | Admitting: Family Medicine

## 2014-10-24 ENCOUNTER — Ambulatory Visit (INDEPENDENT_AMBULATORY_CARE_PROVIDER_SITE_OTHER): Payer: Medicare Other | Admitting: Family Medicine

## 2014-10-24 VITALS — BP 118/74 | HR 73 | Resp 16 | Ht 68.0 in | Wt 301.4 lb

## 2014-10-24 DIAGNOSIS — M25551 Pain in right hip: Secondary | ICD-10-CM

## 2014-10-24 DIAGNOSIS — Z1159 Encounter for screening for other viral diseases: Secondary | ICD-10-CM

## 2014-10-24 DIAGNOSIS — E8881 Metabolic syndrome: Secondary | ICD-10-CM

## 2014-10-24 DIAGNOSIS — E785 Hyperlipidemia, unspecified: Secondary | ICD-10-CM

## 2014-10-24 DIAGNOSIS — E119 Type 2 diabetes mellitus without complications: Secondary | ICD-10-CM | POA: Diagnosis not present

## 2014-10-24 DIAGNOSIS — Z23 Encounter for immunization: Secondary | ICD-10-CM

## 2014-10-24 DIAGNOSIS — I10 Essential (primary) hypertension: Secondary | ICD-10-CM

## 2014-10-24 DIAGNOSIS — F418 Other specified anxiety disorders: Secondary | ICD-10-CM

## 2014-10-24 DIAGNOSIS — E038 Other specified hypothyroidism: Secondary | ICD-10-CM

## 2014-10-24 MED ORDER — GABAPENTIN 300 MG PO CAPS: ORAL_CAPSULE | ORAL | Status: DC

## 2014-10-24 MED ORDER — BENAZEPRIL HCL 5 MG PO TABS: 5.0000 mg | ORAL_TABLET | Freq: Every day | ORAL | Status: DC

## 2014-10-24 MED ORDER — METHYLPREDNISOLONE ACETATE 80 MG/ML IJ SUSP
80.0000 mg | Freq: Once | INTRAMUSCULAR | Status: AC
  Administered 2014-10-24: 80 mg via INTRAMUSCULAR

## 2014-10-24 MED ORDER — KETOROLAC TROMETHAMINE 60 MG/2ML IM SOLN
60.0000 mg | Freq: Once | INTRAMUSCULAR | Status: AC
  Administered 2014-10-24: 60 mg via INTRAMUSCULAR

## 2014-10-24 MED ORDER — HYDROCHLOROTHIAZIDE 12.5 MG PO CAPS: 12.5000 mg | ORAL_CAPSULE | Freq: Every day | ORAL | Status: DC

## 2014-10-24 NOTE — Patient Instructions (Addendum)
F/u first week in January, call if you need me before  Flu vaccine today   You are referred to diabetic educator \   Fasting lipid, cmp and EGFr, hBA1V, and hep C screen end December  Please work on good  health habits so that your health will improve. 1. Commitment to daily physical activity for 30 to 60  minutes, if you are able to do this.  2. Commitment to wise food choices. Aim for half of your  food intake to be vegetable and fruit, one quarter starchy foods, and one quarter protein. Try to eat on a regular schedule  3 meals per day, snacking between meals should be limited to vegetables or fruits or small portions of nuts. 64 ounces of water per day is generally recommended, unless you have specific health conditions, like heart failure or kidney failure where you will need to limit fluid intake.  3. Commitment to sufficient and a  good quality of physical and mental rest daily, generally between 6 to 8 hours per day.  WITH PERSISTANCE AND PERSEVERANCE, THE IMPOSSIBLE , BECOMES THE NORM!  Thanks for choosing [facility], we consider it a privelige to serve you.   T60 D80 for right hip pain  Call back if right elbow pain continues  

## 2014-10-24 NOTE — Progress Notes (Signed)
Subjective:    Patient ID: [name], female    DOB: 08/11/1955, 59 y.o.   MRN: [id number]  HPI   [name]     MRN: [id number]      DOB: March 15, 1955   HPI [name] is here for follow up and re-evaluation of chronic medical conditions, medication management and review of any available recent lab and radiology data.  Preventive health is updated, specifically  Cancer screening and Immunization.    The PT denies any adverse reactions to current medications since the last visit.  1 week h/o increased right knee pain , no aggravating factor, also notes tender swelling on inner aspect of right elbow, no pain with movement or limitation of movement, wants to watch some more prior to ortho eval  ROS Denies recent fever or chills. Denies sinus pressure, nasal congestion, ear pain or sore throat. Denies chest congestion, productive cough or wheezing. Denies chest pains, palpitations and leg swelling Denies abdominal pain, nausea, vomiting,diarrhea or constipation.   Denies dysuria, frequency, hesitancy or incontinence.  Denies headaches, seizures, numbness, or tingling. Denies depression, anxiety or insomnia. Denies skin break down or rash.   PE  BP 118/74 mmHg  Pulse 73  Resp 16  Ht 5\' 8"  (1.727 m)  Wt 301 lb 6.4 oz (136.714 kg)  BMI 45.84 kg/m2  SpO2 96%  Patient alert and oriented and in no cardiopulmonary distress.  HEENT: No facial asymmetry, EOMI,   oropharynx pink and moist.  Neck supple no JVD, no mass.  Chest: Clear to auscultation bilaterally.  CVS: S1, S2 no murmurs, no S3.Regular rate.  ABD: Soft non tender.   Ext: No edema  MS: Adequate though reduced  ROM spine, reduced ROM  right hip  And right knee. Tender swelling on medical aspect right elbow, no bruit heard on ascultation  Skin: Intact, no ulcerations or rash noted.  Psych: Good eye contact, normal affect. Memory intact not anxious or depressed appearing.  CNS: CN 2-12 intact, power,   normal throughout.no focal deficits noted.   Assessment & Plan   Depression with anxiety Resolved symptoms despite not taking any medication. Feels safer and better in her new living environment , also now has to dog sit for her daughter in law who has 5 dogs   Hypertension goal BP (blood pressure) < 130/80 Controlled, no change in medication DASH diet and commitment to daily physical activity for a minimum of 30 minutes discussed and encouraged, as a part of hypertension management. The importance of attaining a healthy weight is also discussed.  BP/Weight 10/24/2014 08/28/2014 04/11/2014 01/14/2014 12/11/2013 78/02/9560 01/28/863  Systolic BP 784 696 295 284 132 440 102  Diastolic BP 74 70 74 80 82 80 78  Wt. (Lbs) 301.4 297 302 310.8 309 303.4 299  BMI 45.84 45.17 45.93 47.27 45.61 44.78 44.13        Right hip pain Uncontrolled.Toradol and depo medrol administered IM in the office , to be followed by a short course of oral prednisone and NSAIDS.   Metabolic syndrome X The increased risk of cardiovascular disease associated with this diagnosis, and the need to consistently work on lifestyle to change this is discussed. Following  a  heart healthy diet ,commitment to 30 minutes of exercise at least 5 days per week, as well as control of blood sugar and cholesterol , and achieving a healthy weight are all the areas to be addressed .   Hypothyroidism Controlled, no change in medication  Hyperlipidemia Hyperlipidemia:Low fat diet discussed and encouraged.   Lipid Panel  Lab Results  Component Value Date   CHOL 238* 08/27/2014   HDL 45* 08/27/2014   LDLCALC 149* 08/27/2014   TRIG 219* 08/27/2014   CHOLHDL 5.3* 08/27/2014   Uncontrolled  Updated lab needed at/ before next visit.      Morbid obesity Deteriorated. Patient re-educated about  the importance of commitment to a  minimum of 150 minutes of exercise per week.  The importance of healthy food choices with  portion control discussed. Encouraged to start a food diary, count calories and to consider  joining a support group. Sample diet sheets offered. Goals set by the patient for the next several months.   Weight /BMI 10/24/2014 08/28/2014 04/11/2014  WEIGHT 301 lb 6.4 oz 297 lb 302 lb  HEIGHT 5\' 8"  5\' 8"  5\' 8"   BMI 45.84 kg/m2 45.17 kg/m2 45.93 kg/m2    Current exercise per week 40 minutes.   Need for prophylactic vaccination and inoculation against influenza After obtaining informed consent, the vaccine is  administered by LPN.        Review of Systems     Objective:   Physical Exam        Assessment & Plan:

## 2014-10-25 DIAGNOSIS — Z23 Encounter for immunization: Secondary | ICD-10-CM | POA: Insufficient documentation

## 2014-10-25 DIAGNOSIS — M25551 Pain in right hip: Secondary | ICD-10-CM | POA: Insufficient documentation

## 2014-10-25 NOTE — Assessment & Plan Note (Signed)
Hyperlipidemia:Low fat diet discussed and encouraged.   Lipid Panel  Lab Results  Component Value Date   CHOL 238* 08/27/2014   HDL 45* 08/27/2014   LDLCALC 149* 08/27/2014   TRIG 219* 08/27/2014   CHOLHDL 5.3* 08/27/2014   Uncontrolled  Updated lab needed at/ before next visit.

## 2014-10-25 NOTE — Assessment & Plan Note (Signed)
Controlled, no change in medication DASH diet and commitment to daily physical activity for a minimum of 30 minutes discussed and encouraged, as a part of hypertension management. The importance of attaining a healthy weight is also discussed.  BP/Weight 10/24/2014 08/28/2014 04/11/2014 01/14/2014 12/11/2013 97/06/7339 09/27/7900  Systolic BP 409 735 329 924 268 341 962  Diastolic BP 74 70 74 80 82 80 78  Wt. (Lbs) 301.4 297 302 310.8 309 303.4 299  BMI 45.84 45.17 45.93 47.27 45.61 44.78 44.13

## 2014-10-25 NOTE — Assessment & Plan Note (Signed)
Resolved symptoms despite not taking any medication. Feels safer and better in her new living environment , also now has to dog sit for her daughter in law who has 5 dogs

## 2014-10-25 NOTE — Assessment & Plan Note (Signed)
Controlled, no change in medication  

## 2014-10-25 NOTE — Assessment & Plan Note (Signed)
Deteriorated. Patient re-educated about  the importance of commitment to a  minimum of 150 minutes of exercise per week.  The importance of healthy food choices with portion control discussed. Encouraged to start a food diary, count calories and to consider  joining a support group. Sample diet sheets offered. Goals set by the patient for the next several months.   Weight /BMI 10/24/2014 08/28/2014 04/11/2014  WEIGHT 301 lb 6.4 oz 297 lb 302 lb  HEIGHT 5\' 8"  5\' 8"  5\' 8"   BMI 45.84 kg/m2 45.17 kg/m2 45.93 kg/m2    Current exercise per week 40 minutes.

## 2014-10-25 NOTE — Assessment & Plan Note (Signed)
The increased risk of cardiovascular disease associated with this diagnosis, and the need to consistently work on lifestyle to change this is discussed. Following  a  heart healthy diet ,commitment to 30 minutes of exercise at least 5 days per week, as well as control of blood sugar and cholesterol , and achieving a healthy weight are all the areas to be addressed .  

## 2014-10-25 NOTE — Assessment & Plan Note (Signed)
Uncontrolled.Toradol and depo medrol administered IM in the office , to be followed by a short course of oral prednisone and NSAIDS.  

## 2014-10-25 NOTE — Assessment & Plan Note (Signed)
After obtaining informed consent, the vaccine is  administered by LPN.  

## 2014-10-31 ENCOUNTER — Other Ambulatory Visit: Payer: Self-pay

## 2014-10-31 DIAGNOSIS — M25521 Pain in right elbow: Secondary | ICD-10-CM

## 2014-11-11 ENCOUNTER — Other Ambulatory Visit: Payer: Self-pay | Admitting: Family Medicine

## 2014-11-19 ENCOUNTER — Ambulatory Visit: Payer: Medicare Other | Admitting: Orthopedic Surgery

## 2014-12-02 ENCOUNTER — Ambulatory Visit: Payer: Medicare Other | Admitting: Orthopedic Surgery

## 2014-12-02 ENCOUNTER — Encounter: Payer: Self-pay | Admitting: Orthopedic Surgery

## 2014-12-02 ENCOUNTER — Ambulatory Visit (INDEPENDENT_AMBULATORY_CARE_PROVIDER_SITE_OTHER): Payer: Medicare Other

## 2014-12-02 ENCOUNTER — Ambulatory Visit (INDEPENDENT_AMBULATORY_CARE_PROVIDER_SITE_OTHER): Payer: Medicare Other | Admitting: Orthopedic Surgery

## 2014-12-02 VITALS — BP 97/66 | Ht 68.0 in | Wt 301.0 lb

## 2014-12-02 DIAGNOSIS — M25521 Pain in right elbow: Secondary | ICD-10-CM | POA: Diagnosis not present

## 2014-12-02 DIAGNOSIS — M659 Synovitis and tenosynovitis, unspecified: Secondary | ICD-10-CM

## 2014-12-02 DIAGNOSIS — M778 Other enthesopathies, not elsewhere classified: Secondary | ICD-10-CM

## 2014-12-02 MED ORDER — DICLOFENAC POTASSIUM 50 MG PO TABS: 50.0000 mg | ORAL_TABLET | Freq: Two times a day (BID) | ORAL | Status: DC

## 2014-12-02 NOTE — Patient Instructions (Signed)
New medication sent to your pharmacy 

## 2014-12-02 NOTE — Progress Notes (Signed)
Patient ID: [name], female   DOB: 1955-02-11, 59 y.o.   MRN: [id number]  Chief Complaint  Patient presents with  . Elbow Pain    Right elbow pain, no injury. Referred by Dr. Moshe Cipro.    HPI AOI KOUNS is a 59 y.o. female.   PRESENTS WITH PAIN ACROSS THE RIGHT ELBOW MEDIAL LATERAL SIDE TH. nO HISTORY OF TRAUMA.  tHE PATIENT COMPLAINS OF CONSTANT BURNING ACHING PAIN WITH SWELLING OVER THE MEDIAL SIDE OF THE ELBOW BUT BILATERAL ELBOW PAIN MEDIAL AND LATERAL EPICONDYLES. tHE ARM SUPPORTED GIVES OUT ON HER AT TIMES.  pREVIOUS TREATMENT INTRAMUSCULAR SHOT OF STEROIDS  mEDICATIONS NONE.   Provoking and alleviating factors none  Review of Systems Review of Systems  pertinent positives numbness tingling but this involves the burning pain in her legs   pertinent negatives negative for fever negative for skin rash   Past Medical History  Diagnosis Date  . Hypertension   . Thyroid disease   . GERD (gastroesophageal reflux disease)   . Diabetes mellitus without complication   . Hyperlipidemia   . Arthritis   . Morbid obesity   . Prediabetes     Past Surgical History  Procedure Laterality Date  . Appendectomy    . Abdominal hysterectomy    . Cholecystectomy    . Tonsillectomy      Family History  Problem Relation Age of Onset  . Asthma Mother   . Hypertension Mother   . Heart disease Mother 22    cabg  . Diabetes Father   . Hypertension Father   . Heart disease Father 35    cabg  . Cancer Sister     breast, uterine  . Cancer Maternal Grandmother   . Cancer Paternal Grandfather   . Cancer Brother 19    breast    Social History Social History  Substance Use Topics  . Smoking status: Never Smoker   . Smokeless tobacco: Not on file  . Alcohol Use: No    Allergies  Allergen Reactions  . Naproxen Sodium Hives and Swelling    MD thought it was the pink dye in the naproxen    Current Outpatient Prescriptions  Medication Sig Dispense Refill  . aspirin  81 MG tablet Take 81 mg by mouth daily.      . benazepril (LOTENSIN) 5 MG tablet Take 1 tablet (5 mg total) by mouth daily. 30 tablet 3  . calcium-vitamin D (OSCAL WITH D) 500-200 MG-UNIT per tablet Take 1 tablet by mouth daily.    . cyclobenzaprine (FLEXERIL) 10 MG tablet One tablet at bedtime, as needed,  Thirty tablets to last 6 month 30 tablet 0  . Dexlansoprazole (DEXILANT) 30 MG capsule Take 30 mg by mouth daily.      . diclofenac (CATAFLAM) 50 MG tablet Take 1 tablet (50 mg total) by mouth 2 (two) times daily. 60 tablet 0  . ezetimibe-simvastatin (VYTORIN) 10-40 MG per tablet Take 1 tablet by mouth daily.    Marland Kitchen gabapentin (NEURONTIN) 300 MG capsule TAKE ONE CAPSULE BY MOUTH THREE TIMES DAILY. 90 capsule 3  . hydrochlorothiazide (MICROZIDE) 12.5 MG capsule Take 1 capsule (12.5 mg total) by mouth daily. 30 capsule 3  . ibuprofen (ADVIL,MOTRIN) 800 MG tablet One tablet 3 times weekly as needed Thirty tablets to last 10 weeks 30 tablet 0  . ibuprofen (ADVIL,MOTRIN) 800 MG tablet TAKE ONE TABLET BY MOUTH THREE TIMES DAILY 21 tablet 0  . levothyroxine (SYNTHROID, LEVOTHROID) 150 MCG tablet  Take 1 tablet (150 mcg total) by mouth daily. 30 tablet 5  . levothyroxine (SYNTHROID, LEVOTHROID) 150 MCG tablet TAKE ONE & ONE-HALF (1 & 1/2) TABLETS BY MOUTH MONDAY, WEDNESDAY, FRIDAY, AND SUNDAY. TAKE ONE TABLET TUESDAY, THURSDAY, AND SATURDAY 36 tablet 1  . metFORMIN (GLUCOPHAGE) 500 MG tablet TAKE ONE TABLET BY MOUTH TWICE DAILY WITH A MEAL 60 tablet 2  . oxyCODONE-acetaminophen (ROXICET) 5-325 MG per tablet One tablet twice weekly as needed, for uncontrolled back pain. Thirty tablets to last six months 30 tablet 0  . venlafaxine XR (EFFEXOR-XR) 37.5 MG 24 hr capsule Take 1 capsule (37.5 mg total) by mouth daily with breakfast. 90 capsule 3   Current Facility-Administered Medications  Medication Dose Route Frequency Provider Last Rate Last Dose  . Influenza (>/= 3 years) inactive virus vaccine  (FLVIRIN/FLUZONE) injection SUSP 0.5 mL  0.5 mL Intramuscular Once Fayrene Helper, MD           Physical Exam Physical Exam Blood pressure 97/66, height 5\' 8"  (1.727 m), weight 301 lb (136.533 kg). Appearance, there are no abnormalities in terms of appearance the patient was well-developed and well-nourished. The grooming and hygiene were normal.  Mental status orientation, there was normal alertness and orientation Mood pleasant Ambulatory status normal with no assistive devices  Examination of the  Right elbow Inspection  Medial lateral tenderness both epicondyles both soft tissue sleeves Range of motion  normal Tests for stability  normal Motor strength   normal Skin warm dry and intact without laceration or ulceration or erythema Neurologic examination normal sensation Vascular examination normal pulses with warm extremity and normal capillary refill  The opposite extremity  normal    Data Reviewed  I interpret the plain film ordered today as normal as well  Assessment   elbow tendinitis medial and lateral   Plan   oral anti-inflammatory recheck one month no improvement switched to prednisone orally patient diabetic also allergic to Anaprox  Meds ordered this encounter  Medications  . diclofenac (CATAFLAM) 50 MG tablet    Sig: Take 1 tablet (50 mg total) by mouth 2 (two) times daily.    Dispense:  60 tablet    Refill:  0

## 2014-12-10 ENCOUNTER — Other Ambulatory Visit: Payer: Self-pay | Admitting: Family Medicine

## 2014-12-31 ENCOUNTER — Ambulatory Visit: Payer: Medicare Other | Admitting: Orthopedic Surgery

## 2015-01-25 LAB — LIPID PANEL
CHOL/HDL RATIO: 4.2 ratio (ref <=5.0)
Cholesterol: 219 mg/dL — ABNORMAL HIGH (ref 125–200)
HDL: 52 mg/dL (ref >=46)
LDL CALC: 135 mg/dL — AB (ref <130)
TRIGLYCERIDES: 161 mg/dL — AB (ref <150)
VLDL: 32 mg/dL — ABNORMAL HIGH (ref <30)

## 2015-01-25 LAB — COMPLETE METABOLIC PANEL WITH GFR
ALT: 19 U/L (ref 6–29)
AST: 20 U/L (ref 10–35)
Albumin: 4 g/dL (ref 3.6–5.1)
Alkaline Phosphatase: 89 U/L (ref 33–130)
BILIRUBIN TOTAL: 0.5 mg/dL (ref 0.2–1.2)
BUN: 20 mg/dL (ref 7–25)
CO2: 28 mmol/L (ref 20–31)
Calcium: 9.3 mg/dL (ref 8.6–10.4)
Chloride: 101 mmol/L (ref 98–110)
Creat: 1.03 mg/dL (ref 0.50–1.05)
GFR, EST AFRICAN AMERICAN: 69 mL/min (ref >=60)
GFR, EST NON AFRICAN AMERICAN: 60 mL/min (ref >=60)
GLUCOSE: 112 mg/dL — AB (ref 65–99)
POTASSIUM: 4.6 mmol/L (ref 3.5–5.3)
SODIUM: 138 mmol/L (ref 135–146)
TOTAL PROTEIN: 6.9 g/dL (ref 6.1–8.1)

## 2015-01-25 LAB — HEPATITIS C ANTIBODY: HCV AB: NEGATIVE

## 2015-01-25 LAB — HEMOGLOBIN A1C
Hgb A1c MFr Bld: 6.7 % — ABNORMAL HIGH (ref <5.7)
Mean Plasma Glucose: 146 mg/dL — ABNORMAL HIGH (ref <117)

## 2015-01-30 ENCOUNTER — Ambulatory Visit (INDEPENDENT_AMBULATORY_CARE_PROVIDER_SITE_OTHER): Payer: Medicare Other | Admitting: Family Medicine

## 2015-01-30 ENCOUNTER — Encounter: Payer: Self-pay | Admitting: Family Medicine

## 2015-01-30 VITALS — BP 114/80 | HR 62 | Resp 16 | Ht 68.0 in | Wt 290.0 lb

## 2015-01-30 DIAGNOSIS — E1169 Type 2 diabetes mellitus with other specified complication: Secondary | ICD-10-CM

## 2015-01-30 DIAGNOSIS — M5416 Radiculopathy, lumbar region: Secondary | ICD-10-CM | POA: Diagnosis not present

## 2015-01-30 DIAGNOSIS — IMO0001 Reserved for inherently not codable concepts without codable children: Secondary | ICD-10-CM

## 2015-01-30 DIAGNOSIS — E785 Hyperlipidemia, unspecified: Secondary | ICD-10-CM

## 2015-01-30 DIAGNOSIS — M25551 Pain in right hip: Secondary | ICD-10-CM

## 2015-01-30 DIAGNOSIS — I10 Essential (primary) hypertension: Secondary | ICD-10-CM

## 2015-01-30 DIAGNOSIS — E119 Type 2 diabetes mellitus without complications: Secondary | ICD-10-CM | POA: Diagnosis not present

## 2015-01-30 DIAGNOSIS — M79672 Pain in left foot: Secondary | ICD-10-CM

## 2015-01-30 DIAGNOSIS — E8881 Metabolic syndrome: Secondary | ICD-10-CM

## 2015-01-30 DIAGNOSIS — E038 Other specified hypothyroidism: Secondary | ICD-10-CM

## 2015-01-30 DIAGNOSIS — K219 Gastro-esophageal reflux disease without esophagitis: Secondary | ICD-10-CM

## 2015-01-30 DIAGNOSIS — E669 Obesity, unspecified: Secondary | ICD-10-CM

## 2015-01-30 MED ORDER — GABAPENTIN 300 MG PO CAPS: 300.0000 mg | ORAL_CAPSULE | Freq: Every day | ORAL | Status: DC

## 2015-01-30 MED ORDER — PREDNISONE 5 MG (21) PO TBPK: 5.0000 mg | ORAL_TABLET | ORAL | Status: DC

## 2015-01-30 MED ORDER — OXYCODONE-ACETAMINOPHEN 5-325 MG PO TABS: ORAL_TABLET | ORAL | Status: DC

## 2015-01-30 MED ORDER — IBUPROFEN 800 MG PO TABS: 800.0000 mg | ORAL_TABLET | Freq: Three times a day (TID) | ORAL | Status: DC | PRN

## 2015-01-30 MED ORDER — METFORMIN HCL 500 MG PO TABS: ORAL_TABLET | ORAL | Status: DC

## 2015-01-30 MED ORDER — CYCLOBENZAPRINE HCL 5 MG PO TABS: ORAL_TABLET | ORAL | Status: DC

## 2015-01-30 MED ORDER — METHYLPREDNISOLONE ACETATE 80 MG/ML IJ SUSP
80.0000 mg | Freq: Once | INTRAMUSCULAR | Status: AC
  Administered 2015-01-30: 80 mg via INTRAMUSCULAR

## 2015-01-30 MED ORDER — KETOROLAC TROMETHAMINE 60 MG/2ML IM SOLN
60.0000 mg | Freq: Once | INTRAMUSCULAR | Status: AC
  Administered 2015-01-30: 60 mg via INTRAMUSCULAR

## 2015-01-30 MED ORDER — BENAZEPRIL HCL 10 MG PO TABS: 10.0000 mg | ORAL_TABLET | Freq: Every day | ORAL | Status: DC

## 2015-01-30 NOTE — Patient Instructions (Addendum)
F/u in 4 month, call if you need me before  You are treated for tendonitis of left heel tendon, injections in offce and meds sent in   Congrats on all great happenings  Please work on good  health habits so that your health will improve. 1. Commitment to daily physical activity for 30 to 60  minutes, if you are able to do this.  2. Commitment to wise food choices. Aim for half of your  food intake to be vegetable and fruit, one quarter starchy foods, and one quarter protein. Try to eat on a regular schedule  3 meals per day, snacking between meals should be limited to vegetables or fruits or small portions of nuts. 64 ounces of water per day is generally recommended, unless you have specific health conditions, like heart failure or kidney failure where you will need to limit fluid intake.  3. Commitment to sufficient and a  good quality of physical and mental rest daily, generally between 6 to 8 hours per day.  WITH PERSISTANCE AND PERSEVERANCE, THE IMPOSSIBLE , BECOMES THE NORM!    Med changes as discussed STOP HCTZ, inc dose of benazepril  Gabapentin is one daily  Fasting lipid, cmp and EGFR, hBA1C, TSH in 4 month

## 2015-01-30 NOTE — Assessment & Plan Note (Signed)
Uncontrolled.Toradol and depo medrol administered IM in the office , to be followed by a short course of oral prednisone and NSAIDS.  

## 2015-01-30 NOTE — Assessment & Plan Note (Signed)
[name] is reminded of the importance of commitment to daily physical activity for 30 minutes or more, as able and the need to limit carbohydrate intake to 30 to 60 grams per meal to help with blood sugar control.   The need to take medication as prescribed, test blood sugar as directed, and to call between visits if there is a concern that blood sugar is uncontrolled is also discussed.   [name] is reminded of the importance of daily foot exam, annual eye examination, and good blood sugar, blood pressure and cholesterol control.  Diabetic Labs Latest Ref Rng 01/24/2015 08/27/2014 04/08/2014 02/04/2014 11/01/2013  HbA1c <5.7 % 6.7(H) 7.0(H) 7.0(H) 7.1(H) 6.8(H)  Microalbumin <2.0 mg/dL - - - - <0.2  Micro/Creat Ratio 0.0 - 30.0 mg/g - - - - SEE NOTE  Chol 125 - 200 mg/dL 219(H) 238(H) 218(H) 246(H) 237(H)  HDL >=46 mg/dL 52 45(L) 44(L) 49 48  Calc LDL <130 mg/dL 135(H) 149(H) 135(H) 160(H) 149(H)  Triglycerides <150 mg/dL 161(H) 219(H) 195(H) 183(H) 198(H)  Creatinine 0.50 - 1.05 mg/dL 1.03 1.19(H) 0.99 1.02 1.03   BP/Weight 01/30/2015 12/02/2014 10/24/2014 08/28/2014 04/11/2014 01/14/2014 [id number]  Systolic BP 99991111 97 [id number]  Diastolic BP 80 66 74 70 74 80 82  Wt. (Lbs) 290 301 301.4 297 302 310.8 309  BMI 44.1 45.78 45.84 45.17 45.93 47.27 45.61   Foot/eye exam completion dates Latest Ref Rng 04/11/2014 10/11/2013  Eye Exam No Retinopathy - No Retinopathy  Foot Form Completion - Done -

## 2015-01-30 NOTE — Progress Notes (Signed)
Subjective:    Patient ID: [name], female    DOB: [birth year], 60 y.o.   MRN: [id number]  HPI   [name]     MRN: [id number]      DOB: [birth year]   HPI [name] is here for follow up and re-evaluation of chronic medical conditions, medication management and review of any available recent lab and radiology data.  Preventive health is updated, specifically  Cancer screening and Immunization.    The PT denies any adverse reactions to current medications since the last visit.   C/o left heel pain , no trauma as aggravating     ROS Denies recent fever or chills. Denies sinus pressure, nasal congestion, ear pain or sore throat. Denies chest congestion, productive cough or wheezing. Denies chest pains, palpitations and leg swelling Denies abdominal pain, nausea, vomiting,diarrhea or constipation.    Denies headaches, seizures, numbness, or tingling. Denies depression, anxiety or insomnia. Denies skin break down or rash.   PE  BP 114/80 mmHg  Pulse 62  Resp 16  Ht 5\' 8"  (1.727 m)  Wt 290 lb (131.543 kg)  BMI 44.10 kg/m2  SpO2 97%  Patient alert and oriented and in no cardiopulmonary distress.  HEENT: No facial asymmetry, EOMI,   oropharynx pink and moist.  Neck supple no JVD, no mass.  Chest: Clear to auscultation bilaterally.  CVS: S1, S2 no murmurs, no S3.Regular rate.  ABD: Soft non tender.   Ext: No edema  MS: Adequate though reduced  ROM spine, and  hips and normal in  Knees.Tender over left achilles tendon pt able to stand on tiptoe  Skin: Intact, no ulcerations or rash noted.  Psych: Good eye contact, normal affect. Memory intact not anxious or depressed appearing.  CNS: CN 2-12 intact, power,  normal throughout.no focal deficits noted.   Assessment & Plan   Diabetes mellitus type 2 in obese [facility]) Improved [name] is reminded of the importance of commitment to daily physical activity for 30 minutes or more, as able and the need to  limit carbohydrate intake to 30 to 60 grams per meal to help with blood sugar control.   The need to take medication as prescribed, test blood sugar as directed, and to call between visits if there is a concern that blood sugar is uncontrolled is also discussed.   [name] is reminded of the importance of daily foot exam, annual eye examination, and good blood sugar, blood pressure and cholesterol control.  Diabetic Labs Latest Ref Rng 01/24/2015 08/27/2014 04/08/2014 02/04/2014 11/01/2013  HbA1c <5.7 % 6.7(H) 7.0(H) 7.0(H) 7.1(H) 6.8(H)  Microalbumin <2.0 mg/dL - - - - <0.2  Micro/Creat Ratio 0.0 - 30.0 mg/g - - - - SEE NOTE  Chol 125 - 200 mg/dL 219(H) 238(H) 218(H) 246(H) 237(H)  HDL >=46 mg/dL 52 45(L) 44(L) 49 48  Calc LDL <130 mg/dL 135(H) 149(H) 135(H) 160(H) 149(H)  Triglycerides <150 mg/dL 161(H) 219(H) 195(H) 183(H) 198(H)  Creatinine 0.50 - 1.05 mg/dL 1.03 1.19(H) 0.99 1.02 1.03   BP/Weight 01/30/2015 12/02/2014 10/24/2014 08/28/2014 04/11/2014 01/14/2014 [id number]  Systolic BP 99991111 97 [id number]  Diastolic BP 80 66 74 70 74 80 82  Wt. (Lbs) 290 301 301.4 297 302 310.8 309  BMI 44.1 45.78 45.84 45.17 45.93 47.27 45.61   Foot/eye exam completion dates Latest Ref Rng 04/11/2014 10/11/2013  Eye Exam No Retinopathy - No Retinopathy  Foot Form Completion - Done -  Pain of left heel Uncontrolled.Toradol and depo medrol administered IM in the office , to be followed by a short course of oral prednisone and NSAIDS.   Radicular pain of right lower back Controlled, gabapentin dose reduced to one at bedtime, has not been taking as prescribed   Right hip pain Controlled, no change in medication   Morbid obesity Improved. Patient re-educated about  the importance of commitment to a  minimum of 150 minutes of exercise per week.  The importance of healthy food choices with portion control discussed. Encouraged to start a food diary, count calories and to consider   joining a support group. Sample diet sheets offered. Goals set by the patient for the next several months.   Weight /BMI 01/30/2015 12/02/2014 10/24/2014  WEIGHT 290 lb 301 lb 301 lb 6.4 oz  HEIGHT 5\' 8"  5\' 8"  5\' 8"   BMI 44.1 kg/m2 45.78 kg/m2 45.84 kg/m2    Current exercise per week 60 minutes.   Metabolic syndrome X The increased risk of cardiovascular disease associated with this diagnosis, and the need to consistently work on lifestyle to change this is discussed. Following  a  heart healthy diet ,commitment to 30 minutes of exercise at least 5 days per week, as well as control of blood sugar and cholesterol , and achieving a healthy weight are all the areas to be addressed .   Hyperlipidemia Uncontrolled Pt to start low fat diet and to take medication as prescribed Hyperlipidemia:Low fat diet discussed and encouraged.   Lipid Panel  Lab Results  Component Value Date   CHOL 219* 01/24/2015   HDL 52 01/24/2015   LDLCALC 135* 01/24/2015   TRIG 161* 01/24/2015   CHOLHDL 4.2 01/24/2015      Updated lab needed at/ before next visit.   Hypertension goal BP (blood pressure) < 130/80 Controlled,will simplify medication, d/c HCTZ DASH diet and commitment to daily physical activity for a minimum of 30 minutes discussed and encouraged, as a part of hypertension management. The importance of attaining a healthy weight is also discussed.  BP/Weight 01/30/2015 12/02/2014 10/24/2014 08/28/2014 04/11/2014 01/14/2014 [id number]  Systolic BP 99991111 97 123456 [id number] [id number]  Diastolic BP 80 66 74 70 74 80 82  Wt. (Lbs) 290 301 301.4 297 302 310.8 309  BMI 44.1 45.78 45.84 45.17 45.93 47.27 45.61        GERD Controlled, no change in medication        Review of Systems     Objective:   Physical Exam        Assessment & Plan:

## 2015-02-02 NOTE — Assessment & Plan Note (Addendum)
Controlled,will simplify medication, d/c HCTZ DASH diet and commitment to daily physical activity for a minimum of 30 minutes discussed and encouraged, as a part of hypertension management. The importance of attaining a healthy weight is also discussed.  BP/Weight 01/30/2015 12/02/2014 10/24/2014 08/28/2014 04/11/2014 01/14/2014 [id number]  Systolic BP 99991111 97 123456 [id number]  Diastolic BP 80 66 74 70 74 80 82  Wt. (Lbs) 290 301 301.4 297 302 310.8 309  BMI 44.1 45.78 45.84 45.17 45.93 47.27 45.61

## 2015-02-02 NOTE — Assessment & Plan Note (Signed)
Uncontrolled Pt to start low fat diet and to take medication as prescribed Hyperlipidemia:Low fat diet discussed and encouraged.   Lipid Panel  Lab Results  Component Value Date   CHOL 219* 01/24/2015   HDL 52 01/24/2015   LDLCALC 135* 01/24/2015   TRIG 161* 01/24/2015   CHOLHDL 4.2 01/24/2015      Updated lab needed at/ before next visit.

## 2015-02-02 NOTE — Assessment & Plan Note (Signed)
Controlled, gabapentin dose reduced to one at bedtime, has not been taking as prescribed

## 2015-02-02 NOTE — Assessment & Plan Note (Signed)
The increased risk of cardiovascular disease associated with this diagnosis, and the need to consistently work on lifestyle to change this is discussed. Following  a  heart healthy diet ,commitment to 30 minutes of exercise at least 5 days per week, as well as control of blood sugar and cholesterol , and achieving a healthy weight are all the areas to be addressed .  

## 2015-02-02 NOTE — Assessment & Plan Note (Signed)
Improved. Patient re-educated about  the importance of commitment to a  minimum of 150 minutes of exercise per week.  The importance of healthy food choices with portion control discussed. Encouraged to start a food diary, count calories and to consider  joining a support group. Sample diet sheets offered. Goals set by the patient for the next several months.   Weight /BMI 01/30/2015 12/02/2014 10/24/2014  WEIGHT 290 lb 301 lb 301 lb 6.4 oz  HEIGHT 5\' 8"  5\' 8"  5\' 8"   BMI 44.1 kg/m2 45.78 kg/m2 45.84 kg/m2    Current exercise per week 60 minutes.

## 2015-02-02 NOTE — Assessment & Plan Note (Signed)
Controlled, no change in medication  

## 2015-02-27 ENCOUNTER — Other Ambulatory Visit: Payer: Self-pay | Admitting: Family Medicine

## 2015-03-20 ENCOUNTER — Telehealth: Payer: Self-pay

## 2015-03-20 NOTE — Telephone Encounter (Signed)
pls send wellbutrin XL  150 mg once daily x 3 month, let her know

## 2015-03-20 NOTE — Telephone Encounter (Signed)
Review shows effexor not wellbutrin,, explain this to her for clarification, I know she also used to get meds from health dept, need more investigation

## 2015-03-20 NOTE — Telephone Encounter (Signed)
Spoke with patient and she states that she was unable to take effexor because of side effects

## 2015-03-21 MED ORDER — BUPROPION HCL ER (XL) 150 MG PO TB24: 150.0000 mg | ORAL_TABLET | Freq: Every day | ORAL | Status: DC

## 2015-03-21 NOTE — Telephone Encounter (Signed)
Patient aware.  Medication sent to pharmacy.  

## 2015-03-21 NOTE — Addendum Note (Signed)
Addended by: Denman George B on: 03/21/2015 08:32 AM   Modules accepted: Orders

## 2015-03-31 ENCOUNTER — Other Ambulatory Visit: Payer: Self-pay | Admitting: Family Medicine

## 2015-04-16 ENCOUNTER — Telehealth: Payer: Self-pay | Admitting: Family Medicine

## 2015-04-16 NOTE — Telephone Encounter (Signed)
pt wants to know if she can have 10 more percocets

## 2015-04-17 NOTE — Telephone Encounter (Signed)
Patient is calling back to check on her Percocets, please advise?

## 2015-04-17 NOTE — Telephone Encounter (Signed)
Her last fill was the beginning of January. She is calling in to ask for 10 more percocets on her rx because she is running out with just the 30. Please advise

## 2015-04-17 NOTE — Telephone Encounter (Signed)
Patient aware.  States that she will try to hold until next appointment.  Will call if she needs to come in before.

## 2015-04-17 NOTE — Telephone Encounter (Signed)
Change in dosing requires OV

## 2015-04-30 ENCOUNTER — Ambulatory Visit (INDEPENDENT_AMBULATORY_CARE_PROVIDER_SITE_OTHER): Payer: Medicare Other | Admitting: Family Medicine

## 2015-04-30 ENCOUNTER — Other Ambulatory Visit: Payer: Self-pay | Admitting: Family Medicine

## 2015-04-30 ENCOUNTER — Encounter: Payer: Self-pay | Admitting: Family Medicine

## 2015-04-30 VITALS — BP 120/84 | HR 76 | Resp 18 | Ht 68.0 in | Wt 284.0 lb

## 2015-04-30 DIAGNOSIS — E119 Type 2 diabetes mellitus without complications: Secondary | ICD-10-CM | POA: Diagnosis not present

## 2015-04-30 DIAGNOSIS — M5416 Radiculopathy, lumbar region: Secondary | ICD-10-CM | POA: Diagnosis not present

## 2015-04-30 DIAGNOSIS — Z1231 Encounter for screening mammogram for malignant neoplasm of breast: Secondary | ICD-10-CM

## 2015-04-30 DIAGNOSIS — E038 Other specified hypothyroidism: Secondary | ICD-10-CM

## 2015-04-30 DIAGNOSIS — F418 Other specified anxiety disorders: Secondary | ICD-10-CM

## 2015-04-30 DIAGNOSIS — IMO0001 Reserved for inherently not codable concepts without codable children: Secondary | ICD-10-CM

## 2015-04-30 DIAGNOSIS — I10 Essential (primary) hypertension: Secondary | ICD-10-CM

## 2015-04-30 DIAGNOSIS — E1169 Type 2 diabetes mellitus with other specified complication: Secondary | ICD-10-CM

## 2015-04-30 DIAGNOSIS — E669 Obesity, unspecified: Secondary | ICD-10-CM

## 2015-04-30 MED ORDER — TEMAZEPAM 15 MG PO CAPS: 15.0000 mg | ORAL_CAPSULE | Freq: Every evening | ORAL | Status: DC | PRN

## 2015-04-30 MED ORDER — BUPROPION HCL ER (SR) 150 MG PO TB12: 150.0000 mg | ORAL_TABLET | Freq: Two times a day (BID) | ORAL | Status: DC

## 2015-04-30 MED ORDER — KETOROLAC TROMETHAMINE 60 MG/2ML IM SOLN
60.0000 mg | Freq: Once | INTRAMUSCULAR | Status: AC
  Administered 2015-04-30: 60 mg via INTRAMUSCULAR

## 2015-04-30 MED ORDER — METHYLPREDNISOLONE ACETATE 80 MG/ML IJ SUSP
80.0000 mg | Freq: Once | INTRAMUSCULAR | Status: AC
  Administered 2015-04-30: 80 mg via INTRAMUSCULAR

## 2015-04-30 MED ORDER — PREDNISONE 5 MG (21) PO TBPK: 5.0000 mg | ORAL_TABLET | ORAL | Status: DC

## 2015-04-30 MED ORDER — GABAPENTIN 300 MG PO CAPS: 300.0000 mg | ORAL_CAPSULE | Freq: Three times a day (TID) | ORAL | Status: DC

## 2015-04-30 MED ORDER — IBUPROFEN 800 MG PO TABS: 800.0000 mg | ORAL_TABLET | Freq: Three times a day (TID) | ORAL | Status: DC | PRN

## 2015-04-30 NOTE — Patient Instructions (Addendum)
F/u as before  Injection for pain and scripts given for pain and for depression and lack of sleep Thank you  for choosing [facility]. We consider it a privelige to serve you.  Delivering excellent health care in a caring and  compassionate way is our goal.  Partnering with you,  so that together we can achieve this goal is our strategy.

## 2015-04-30 NOTE — Progress Notes (Signed)
Subjective:    Patient ID: [name], female    DOB: 08-27-1955, 60 y.o.   MRN: [id number]  HPI 1 week h/o increased back pain radiating to the  extremity, without any new  weakness and numbness in the extremity. Denies incontinence of stool or urine, and there is  no recent history of any  inciting trauma. C/o increased depression with insomnia, not suicidal or homicidal, but very poor self esteem continues, present since childhood. States she has "never known what it is to feel happy"   Review of Systems See HPI Denies recent fever or chills. Denies sinus pressure, nasal congestion, ear pain or sore throat. Denies chest congestion, productive cough or wheezing. Denies chest pains, palpitations and leg swelling Denies abdominal pain, nausea, vomiting,diarrhea or constipation.   Denies dysuria, frequency, hesitancy or incontinence.  Denies headaches, seizures, numbness, or tingling.  Denies skin break down or rash.        Objective:   Physical Exam BP 120/84 mmHg  Pulse 76  Resp 18  Ht 5\' 8"  (1.727 m)  Wt 284 lb (128.822 kg)  BMI 43.19 kg/m2  SpO2 96% Patient alert and oriented and in no cardiopulmonary distress.  HEENT: No facial asymmetry, EOMI,   oropharynx pink and moist.  Neck supple no JVD, no mass.  Chest: Clear to auscultation bilaterally.  CVS: S1, S2 no murmurs, no S3.Regular rate.  ABD: Soft non tender.   Ext: No edema  MS: Decreased ROM spine, adequate in shoulders, hips and knees.  Skin: Intact, no ulcerations or rash noted.  Psych: Good eye contact, tearful at times. Memory intact depressed appearing, not anxious  CNS: CN 2-12 intact, power,  normal throughout.no focal deficits noted.        Assessment & Plan:  Radicular pain of right lower back Uncontrolled.Toradol and depo medrol administered IM in the office , to be followed by a short course of oral prednisone and NSAIDS.   Depression with anxiety Uncontrolled, not actively  suicidal or homicidal, but feels her life is of little/ no worth, resists therapy though would absolutely benfit, . Medication to be resumed, change to short acting Wellbutrin, add medication for sleep  Hypertension goal BP (blood pressure) < 130/80 Controlled, no change in medication DASH diet and commitment to daily physical activity for a minimum of 30 minutes discussed and encouraged, as a part of hypertension management. The importance of attaining a healthy weight is also discussed.  BP/Weight 04/30/2015 01/30/2015 12/02/2014 10/24/2014 08/28/2014 04/11/2014 [id number]  Systolic BP 123456 [id number] 97 [id number]  Diastolic BP 84 80 66 74 70 74 80  Wt. (Lbs) 284 290 301 301.4 297 302 310.8  BMI 43.19 44.1 45.78 45.84 45.17 45.93 47.27        Diabetes mellitus type 2 in obese [facility]) [name] is reminded of the importance of commitment to daily physical activity for 30 minutes or more, as able and the need to limit carbohydrate intake to 30 to 60 grams per meal to help with blood sugar control.   The need to take medication as prescribed, test blood sugar as directed, and to call between visits if there is a concern that blood sugar is uncontrolled is also discussed.   [name] is reminded of the importance of daily foot exam, annual eye examination, and good blood sugar, blood pressure and cholesterol control.  Diabetic Labs Latest Ref Rng 01/24/2015 08/27/2014 04/08/2014 02/04/2014 11/01/2013  HbA1c <5.7 % 6.7(H) 7.0(H) 7.0(H) 7.1(H) 6.8(H)  Microalbumin <2.0 mg/dL - - - - <0.2  Micro/Creat Ratio 0.0 - 30.0 mg/g - - - - SEE NOTE  Chol 125 - 200 mg/dL 219(H) 238(H) 218(H) 246(H) 237(H)  HDL >=46 mg/dL 52 45(L) 44(L) 49 48  Calc LDL <130 mg/dL 135(H) 149(H) 135(H) 160(H) 149(H)  Triglycerides <150 mg/dL 161(H) 219(H) 195(H) 183(H) 198(H)  Creatinine 0.50 - 1.05 mg/dL 1.03 1.19(H) 0.99 1.02 1.03   BP/Weight 04/30/2015 01/30/2015 12/02/2014 10/24/2014 08/28/2014 04/11/2014 [id number]  Systolic BP 123456  [id number] 97 123456 [id number]  Diastolic BP 84 80 66 74 70 74 80  Wt. (Lbs) 284 290 301 301.4 297 302 310.8  BMI 43.19 44.1 45.78 45.84 45.17 45.93 47.27   Foot/eye exam completion dates Latest Ref Rng 04/11/2014 10/11/2013  Eye Exam No Retinopathy - No Retinopathy  Foot Form Completion - Done -         Hypothyroidism Updated lab needed at/ before next visit.   Morbid obesity Improved. Pt applauded on succesful weight loss through lifestyle change, and encouraged to continue same. Weight loss goal set for the next several months.

## 2015-05-04 NOTE — Assessment & Plan Note (Signed)
Controlled, no change in medication DASH diet and commitment to daily physical activity for a minimum of 30 minutes discussed and encouraged, as a part of hypertension management. The importance of attaining a healthy weight is also discussed.  BP/Weight 04/30/2015 01/30/2015 12/02/2014 10/24/2014 08/28/2014 04/11/2014 [id number]  Systolic BP 123456 [id number] 97 [id number]  Diastolic BP 84 80 66 74 70 74 80  Wt. (Lbs) 284 290 301 301.4 297 302 310.8  BMI 43.19 44.1 45.78 45.84 45.17 45.93 47.27

## 2015-05-04 NOTE — Assessment & Plan Note (Signed)
Uncontrolled, not actively suicidal or homicidal, but feels her life is of little/ no worth, resists therapy though would absolutely benfit, . Medication to be resumed, change to short acting Wellbutrin, add medication for sleep

## 2015-05-04 NOTE — Assessment & Plan Note (Signed)
Improved. Pt applauded on succesful weight loss through lifestyle change, and encouraged to continue same. Weight loss goal set for the next several months.  

## 2015-05-04 NOTE — Assessment & Plan Note (Signed)
Updated lab needed at/ before next visit.   

## 2015-05-04 NOTE — Assessment & Plan Note (Signed)
[name] is reminded of the importance of commitment to daily physical activity for 30 minutes or more, as able and the need to limit carbohydrate intake to 30 to 60 grams per meal to help with blood sugar control.   The need to take medication as prescribed, test blood sugar as directed, and to call between visits if there is a concern that blood sugar is uncontrolled is also discussed.   [name] is reminded of the importance of daily foot exam, annual eye examination, and good blood sugar, blood pressure and cholesterol control.  Diabetic Labs Latest Ref Rng 01/24/2015 08/27/2014 04/08/2014 02/04/2014 11/01/2013  HbA1c <5.7 % 6.7(H) 7.0(H) 7.0(H) 7.1(H) 6.8(H)  Microalbumin <2.0 mg/dL - - - - <0.2  Micro/Creat Ratio 0.0 - 30.0 mg/g - - - - SEE NOTE  Chol 125 - 200 mg/dL 219(H) 238(H) 218(H) 246(H) 237(H)  HDL >=46 mg/dL 52 45(L) 44(L) 49 48  Calc LDL <130 mg/dL 135(H) 149(H) 135(H) 160(H) 149(H)  Triglycerides <150 mg/dL 161(H) 219(H) 195(H) 183(H) 198(H)  Creatinine 0.50 - 1.05 mg/dL 1.03 1.19(H) 0.99 1.02 1.03   BP/Weight 04/30/2015 01/30/2015 12/02/2014 10/24/2014 08/28/2014 04/11/2014 [id number]  Systolic BP 123456 [id number] 97 123456 [id number]  Diastolic BP 84 80 66 74 70 74 80  Wt. (Lbs) 284 290 301 301.4 297 302 310.8  BMI 43.19 44.1 45.78 45.84 45.17 45.93 47.27   Foot/eye exam completion dates Latest Ref Rng 04/11/2014 10/11/2013  Eye Exam No Retinopathy - No Retinopathy  Foot Form Completion - Done -

## 2015-05-04 NOTE — Assessment & Plan Note (Signed)
Uncontrolled.Toradol and depo medrol administered IM in the office , to be followed by a short course of oral prednisone and NSAIDS.  

## 2015-05-05 ENCOUNTER — Ambulatory Visit (HOSPITAL_COMMUNITY)
Admission: RE | Admit: 2015-05-05 | Discharge: 2015-05-05 | Disposition: A | Discharge status: Home / Self Care | Payer: Medicare Other | Source: Ambulatory Visit | Attending: Family Medicine | Admitting: Family Medicine

## 2015-05-05 DIAGNOSIS — Z1231 Encounter for screening mammogram for malignant neoplasm of breast: Secondary | ICD-10-CM | POA: Diagnosis not present

## 2015-05-27 ENCOUNTER — Telehealth: Payer: Self-pay

## 2015-05-27 NOTE — Telephone Encounter (Signed)
Advise use of OTC antibiotic if she has, if not send bactroban ointment twice dailty to affected area x 5 days , then as needed please

## 2015-05-28 NOTE — Telephone Encounter (Signed)
Patient aware.  Does have an appointment scheduled for next week and will seek care or call office back if problem worsens.

## 2015-05-30 DIAGNOSIS — E669 Obesity, unspecified: Secondary | ICD-10-CM | POA: Diagnosis not present

## 2015-05-30 DIAGNOSIS — E119 Type 2 diabetes mellitus without complications: Secondary | ICD-10-CM | POA: Diagnosis not present

## 2015-05-30 DIAGNOSIS — E785 Hyperlipidemia, unspecified: Secondary | ICD-10-CM | POA: Diagnosis not present

## 2015-05-30 DIAGNOSIS — I10 Essential (primary) hypertension: Secondary | ICD-10-CM | POA: Diagnosis not present

## 2015-05-31 LAB — TSH: TSH: 0.06 m[IU]/L — AB

## 2015-05-31 LAB — COMPLETE METABOLIC PANEL WITH GFR
ALBUMIN: 3.8 g/dL (ref 3.6–5.1)
ALK PHOS: 92 U/L (ref 33–130)
ALT: 17 U/L (ref 6–29)
AST: 17 U/L (ref 10–35)
BILIRUBIN TOTAL: 0.4 mg/dL (ref 0.2–1.2)
BUN: 24 mg/dL (ref 7–25)
CALCIUM: 9.4 mg/dL (ref 8.6–10.4)
CO2: 28 mmol/L (ref 20–31)
Chloride: 101 mmol/L (ref 98–110)
Creat: 0.97 mg/dL (ref 0.50–1.05)
GFR, EST NON AFRICAN AMERICAN: 64 mL/min (ref >=60)
GFR, Est African American: 74 mL/min (ref >=60)
Glucose, Bld: 101 mg/dL — ABNORMAL HIGH (ref 65–99)
POTASSIUM: 5.3 mmol/L (ref 3.5–5.3)
Sodium: 139 mmol/L (ref 135–146)
TOTAL PROTEIN: 6.6 g/dL (ref 6.1–8.1)

## 2015-05-31 LAB — LIPID PANEL
CHOL/HDL RATIO: 3.2 ratio (ref <=5.0)
CHOLESTEROL: 199 mg/dL (ref 125–200)
HDL: 63 mg/dL (ref >=46)
LDL Cholesterol: 118 mg/dL (ref <130)
Triglycerides: 90 mg/dL (ref <150)
VLDL: 18 mg/dL (ref <30)

## 2015-05-31 LAB — HEMOGLOBIN A1C
Hgb A1c MFr Bld: 6.5 % — ABNORMAL HIGH (ref <5.7)
Mean Plasma Glucose: 140 mg/dL

## 2015-06-04 ENCOUNTER — Encounter: Payer: Self-pay | Admitting: Family Medicine

## 2015-06-04 ENCOUNTER — Other Ambulatory Visit: Payer: Self-pay | Admitting: Family Medicine

## 2015-06-04 ENCOUNTER — Inpatient Hospital Stay (HOSPITAL_COMMUNITY): Admit: 2015-06-04 | Payer: Self-pay

## 2015-06-04 ENCOUNTER — Ambulatory Visit (INDEPENDENT_AMBULATORY_CARE_PROVIDER_SITE_OTHER): Payer: Medicare Other | Admitting: Family Medicine

## 2015-06-04 VITALS — BP 114/74 | HR 92 | Resp 16 | Ht 68.0 in | Wt 280.0 lb

## 2015-06-04 DIAGNOSIS — E785 Hyperlipidemia, unspecified: Secondary | ICD-10-CM | POA: Diagnosis not present

## 2015-06-04 DIAGNOSIS — E119 Type 2 diabetes mellitus without complications: Secondary | ICD-10-CM | POA: Diagnosis not present

## 2015-06-04 DIAGNOSIS — E038 Other specified hypothyroidism: Secondary | ICD-10-CM

## 2015-06-04 DIAGNOSIS — F418 Other specified anxiety disorders: Secondary | ICD-10-CM | POA: Diagnosis not present

## 2015-06-04 DIAGNOSIS — R42 Dizziness and giddiness: Secondary | ICD-10-CM

## 2015-06-04 DIAGNOSIS — I10 Essential (primary) hypertension: Secondary | ICD-10-CM | POA: Diagnosis not present

## 2015-06-04 DIAGNOSIS — E1169 Type 2 diabetes mellitus with other specified complication: Secondary | ICD-10-CM

## 2015-06-04 DIAGNOSIS — M5416 Radiculopathy, lumbar region: Secondary | ICD-10-CM

## 2015-06-04 DIAGNOSIS — IMO0001 Reserved for inherently not codable concepts without codable children: Secondary | ICD-10-CM

## 2015-06-04 DIAGNOSIS — E669 Obesity, unspecified: Secondary | ICD-10-CM

## 2015-06-04 MED ORDER — TRAZODONE HCL 100 MG PO TABS: 100.0000 mg | ORAL_TABLET | Freq: Every day | ORAL | Status: DC

## 2015-06-04 MED ORDER — LEVOTHYROXINE SODIUM 150 MCG PO TABS: 150.0000 ug | ORAL_TABLET | Freq: Every day | ORAL | Status: DC

## 2015-06-04 NOTE — Assessment & Plan Note (Addendum)
Intolerant of wellbutrin, will start trazodone, not suicidal or homicidal

## 2015-06-04 NOTE — Progress Notes (Signed)
Subjective:    Patient ID: [name], female    DOB: 11/01/1955, 60 y.o.   MRN: [id number]  HPI    [name]     MRN: [id number]      DOB: 07/20/1955   HPI [name] is here for follow up and re-evaluation of chronic medical conditions, medication management and review of any available recent lab and radiology data.  Preventive health is updated, specifically  Cancer screening and Immunization.   Still depressed and with poor  Sleep, stopped antidepressants prescribed as she "felt worse" C/o intermittent vertigo  ROS Denies recent fever or chills. Denies sinus pressure, nasal congestion, ear pain or sore throat. Denies chest congestion, productive cough or wheezing. Denies chest pains, palpitations and leg swelling Denies abdominal pain, nausea, vomiting,diarrhea or constipation.   Denies dysuria, frequency, hesitancy or incontinence. Chronic  joint pain, swelling and limitation in mobility. Denies headaches, seizures, numbness, or tingling. C/o  depression, anxiety or insomnia.Intolerant of meds prescribed so she stopped them, not suicidal or homicidal, a lot of personal distress in her home life and recent marriage Denies skin break down or rash.   PE  BP 114/74 mmHg  Pulse 92  Resp 16  Ht 5\' 8"  (1.727 m)  Wt 280 lb (127.007 kg)  BMI 42.58 kg/m2  SpO2 97%  Patient alert and oriented and in no cardiopulmonary distress.  HEENT: No facial asymmetry, EOMI,   oropharynx pink and moist.  Neck supple no JVD, no mass. No nystagmus, TM clear, no sinus tenderness Chest: Clear to auscultation bilaterally.  CVS: S1, S2 no murmurs, no S3.Regular rate.  ABD: Soft non tender.   Ext: No edema  MS: Adequate ROM spine, shoulders, hips and knees.  Skin: Intact, no ulcerations or rash noted.  Psych: Good eye contact, normal affect. Memory intact not anxious or depressed appearing.  CNS: CN 2-12 intact, power,  normal throughout.no focal deficits  noted.   Assessment & Plan  Depression with anxiety Intolerant of wellbutrin, will start trazodone, not suicidal or homicidal  Hypothyroidism Over corrected, reduce synthroid to one daily  Morbid obesity Improved. Pt applauded on succesful weight loss through lifestyle change, and encouraged to continue same. Weight loss goal set for the next several months.   Radicular pain of right lower back Less painful with weight loss, discussed appropriate management of chronic pain with non narcotic medication which she is willing to do.   Diabetes mellitus type 2 in obese (HCC) Controlled, no change in management , which is diet controlled [name] is reminded of the importance of commitment to daily physical activity for 30 minutes or more, as able and the need to limit carbohydrate intake to 30 to 60 grams per meal to help with blood sugar control.   [name] is reminded of the importance of daily foot exam, annual eye examination, and good blood sugar, blood pressure and cholesterol control.  Diabetic Labs Latest Ref Rng 06/04/2015 05/30/2015 01/24/2015 08/27/2014 04/08/2014  HbA1c <5.7 % - 6.5(H) 6.7(H) 7.0(H) 7.0(H)  Microalbumin Not estab mg/dL <0.2 - - - -  Micro/Creat Ratio <30 mcg/mg creat SEE NOTE - - - -  Chol 125 - 200 mg/dL - 199 219(H) 238(H) 218(H)  HDL >=46 mg/dL - 63 52 45(L) 44(L)  Calc LDL <130 mg/dL - 118 135(H) 149(H) 135(H)  Triglycerides <150 mg/dL - 90 161(H) 219(H) 195(H)  Creatinine 0.50 - 1.05 mg/dL - 0.97 1.03 1.19(H) 0.99   BP/Weight 06/04/2015 04/30/2015 01/30/2015 12/02/2014  10/24/2014 08/28/2014 [id number]  Systolic BP 99991111 123456 99991111 97 123456 [id number]  Diastolic BP 74 84 80 66 74 70 74  Wt. (Lbs) 280 284 290 301 301.4 297 302  BMI 42.58 43.19 44.1 45.78 45.84 45.17 45.93   Foot/eye exam completion dates Latest Ref Rng 04/30/2015 04/11/2014  Eye Exam No Retinopathy - -  Foot Form Completion - Done Done         Hypertension goal BP (blood pressure) <  130/80 Controlled, no change in medication DASH diet and commitment to daily physical activity for a minimum of 30 minutes discussed and encouraged, as a part of hypertension management. The importance of attaining a healthy weight is also discussed.  BP/Weight 06/04/2015 04/30/2015 01/30/2015 12/02/2014 10/24/2014 08/28/2014 [id number]  Systolic BP 99991111 123456 99991111 97 123456 [id number]  Diastolic BP 74 84 80 66 74 70 74  Wt. (Lbs) 280 284 290 301 301.4 297 302  BMI 42.58 43.19 44.1 45.78 45.84 45.17 45.93        Vertigo Intermittent and not disabling, responds to antivert, will use as needed     Review of Systems     Objective:   Physical Exam        Assessment & Plan:

## 2015-06-04 NOTE — Patient Instructions (Addendum)
CPE in 4.5 month, call if you need me before  New med for depression and sleep is trazodone, stop wellbutrin and restoril  Reduced dose of synthroid as  This is too high of a dose, reduce to one daily  Microalb today  Fasting lipid, cmp and HBa1C and TSH   Please work on good  health habits so that your health will improve. 1. Commitment to daily physical activity for 30 to 60  minutes, if you are able to do this.  2. Commitment to wise food choices. Aim for half of your  food intake to be vegetable and fruit, one quarter starchy foods, and one quarter protein. Try to eat on a regular schedule  3 meals per day, snacking between meals should be limited to vegetables or fruits or small portions of nuts. 64 ounces of water per day is generally recommended, unless you have specific health conditions, like heart failure or kidney failure where you will need to limit fluid intake.  3. Commitment to sufficient and a  good quality of physical and mental rest daily, generally between 6 to 8 hours per day.  WITH PERSISTANCE AND PERSEVERANCE, THE IMPOSSIBLE , BECOMES THE NORM!   Thanks for choosing [facility], we consider it a privelige to serve you. You will be referred to ortho re left knee instability once you call

## 2015-06-04 NOTE — Assessment & Plan Note (Signed)
Over corrected, reduce synthroid to one daily

## 2015-06-05 LAB — MICROALBUMIN / CREATININE URINE RATIO: Creatinine, Urine: 42 mg/dL (ref 20–320)

## 2015-06-06 ENCOUNTER — Other Ambulatory Visit: Payer: Self-pay

## 2015-06-06 DIAGNOSIS — I10 Essential (primary) hypertension: Secondary | ICD-10-CM

## 2015-06-06 MED ORDER — BENAZEPRIL HCL 10 MG PO TABS: 10.0000 mg | ORAL_TABLET | Freq: Every day | ORAL | Status: DC

## 2015-06-19 DIAGNOSIS — M25861 Other specified joint disorders, right knee: Secondary | ICD-10-CM | POA: Diagnosis not present

## 2015-06-19 DIAGNOSIS — M25862 Other specified joint disorders, left knee: Secondary | ICD-10-CM | POA: Diagnosis not present

## 2015-06-23 ENCOUNTER — Encounter: Payer: Self-pay | Admitting: Family Medicine

## 2015-06-23 DIAGNOSIS — R42 Dizziness and giddiness: Secondary | ICD-10-CM | POA: Insufficient documentation

## 2015-06-23 NOTE — Assessment & Plan Note (Signed)
Intermittent and not disabling, responds to antivert, will use as needed

## 2015-06-23 NOTE — Assessment & Plan Note (Signed)
Controlled, no change in medication DASH diet and commitment to daily physical activity for a minimum of 30 minutes discussed and encouraged, as a part of hypertension management. The importance of attaining a healthy weight is also discussed.  BP/Weight 06/04/2015 04/30/2015 01/30/2015 12/02/2014 10/24/2014 08/28/2014 [id number]  Systolic BP 99991111 123456 99991111 97 123456 [id number]  Diastolic BP 74 84 80 66 74 70 74  Wt. (Lbs) 280 284 290 301 301.4 297 302  BMI 42.58 43.19 44.1 45.78 45.84 45.17 45.93

## 2015-06-23 NOTE — Assessment & Plan Note (Signed)
Less painful with weight loss, discussed appropriate management of chronic pain with non narcotic medication which she is willing to do.

## 2015-06-23 NOTE — Assessment & Plan Note (Signed)
Improved. Pt applauded on succesful weight loss through lifestyle change, and encouraged to continue same. Weight loss goal set for the next several months.  

## 2015-06-23 NOTE — Assessment & Plan Note (Signed)
Controlled, no change in management , which is diet controlled [name] is reminded of the importance of commitment to daily physical activity for 30 minutes or more, as able and the need to limit carbohydrate intake to 30 to 60 grams per meal to help with blood sugar control.   [name] is reminded of the importance of daily foot exam, annual eye examination, and good blood sugar, blood pressure and cholesterol control.  Diabetic Labs Latest Ref Rng 06/04/2015 05/30/2015 01/24/2015 08/27/2014 04/08/2014  HbA1c <5.7 % - 6.5(H) 6.7(H) 7.0(H) 7.0(H)  Microalbumin Not estab mg/dL <0.2 - - - -  Micro/Creat Ratio <30 mcg/mg creat SEE NOTE - - - -  Chol 125 - 200 mg/dL - 199 219(H) 238(H) 218(H)  HDL >=46 mg/dL - 63 52 45(L) 44(L)  Calc LDL <130 mg/dL - 118 135(H) 149(H) 135(H)  Triglycerides <150 mg/dL - 90 161(H) 219(H) 195(H)  Creatinine 0.50 - 1.05 mg/dL - 0.97 1.03 1.19(H) 0.99   BP/Weight 06/04/2015 04/30/2015 01/30/2015 12/02/2014 10/24/2014 08/28/2014 [id number]  Systolic BP 99991111 123456 99991111 97 123456 [id number]  Diastolic BP 74 84 80 66 74 70 74  Wt. (Lbs) 280 284 290 301 301.4 297 302  BMI 42.58 43.19 44.1 45.78 45.84 45.17 45.93   Foot/eye exam completion dates Latest Ref Rng 04/30/2015 04/11/2014  Eye Exam No Retinopathy - -  Foot Form Completion - Done Done

## 2015-07-30 ENCOUNTER — Other Ambulatory Visit: Payer: Self-pay | Admitting: Family Medicine

## 2015-08-21 ENCOUNTER — Telehealth: Payer: Self-pay | Admitting: Family Medicine

## 2015-08-21 NOTE — Telephone Encounter (Signed)
Pt has an abscessed tooth coming on - wants to know if Dr. Moshe Cipro can send in an antibiotic bc she doesn't see her dentist for a few days

## 2015-08-22 ENCOUNTER — Other Ambulatory Visit: Payer: Self-pay | Admitting: Family Medicine

## 2015-08-22 MED ORDER — CEPHALEXIN 500 MG PO CAPS: 500.0000 mg | ORAL_CAPSULE | Freq: Four times a day (QID) | ORAL | 0 refills | Status: AC

## 2015-08-22 NOTE — Telephone Encounter (Signed)
[phone] pt called back please call back on her cell

## 2015-08-22 NOTE — Telephone Encounter (Signed)
Called patient and left message for them to return call at the office   

## 2015-08-22 NOTE — Telephone Encounter (Signed)
Pt had called last night and I had advised her to go to urgent care as she stated tooth was half broken I called today, did not go, hopes to see her dentist on Monday, 1 week course of keflex prescribed to be sent to [place] and she is aware, pls fax

## 2015-08-22 NOTE — Telephone Encounter (Signed)
Faxed to [facility]

## 2015-08-22 NOTE — Telephone Encounter (Signed)
Left lower tooth, hurting very badly, some swelling with a knot. Requesting antibiotic until she can see her dentist on Monday.

## 2015-09-02 ENCOUNTER — Other Ambulatory Visit: Payer: Self-pay | Admitting: Family Medicine

## 2015-09-02 DIAGNOSIS — IMO0001 Reserved for inherently not codable concepts without codable children: Secondary | ICD-10-CM

## 2015-10-07 ENCOUNTER — Other Ambulatory Visit: Payer: Self-pay | Admitting: Family Medicine

## 2015-11-06 ENCOUNTER — Other Ambulatory Visit: Payer: Self-pay | Admitting: Family Medicine

## 2015-11-06 DIAGNOSIS — F418 Other specified anxiety disorders: Secondary | ICD-10-CM

## 2015-11-06 DIAGNOSIS — E785 Hyperlipidemia, unspecified: Secondary | ICD-10-CM | POA: Diagnosis not present

## 2015-11-06 DIAGNOSIS — E038 Other specified hypothyroidism: Secondary | ICD-10-CM | POA: Diagnosis not present

## 2015-11-06 DIAGNOSIS — I10 Essential (primary) hypertension: Secondary | ICD-10-CM | POA: Diagnosis not present

## 2015-11-06 DIAGNOSIS — E669 Obesity, unspecified: Secondary | ICD-10-CM | POA: Diagnosis not present

## 2015-11-06 DIAGNOSIS — E119 Type 2 diabetes mellitus without complications: Secondary | ICD-10-CM | POA: Diagnosis not present

## 2015-11-06 LAB — LIPID PANEL
CHOLESTEROL: 204 mg/dL — AB (ref 125–200)
HDL: 41 mg/dL — ABNORMAL LOW (ref >=46)
LDL Cholesterol: 131 mg/dL — ABNORMAL HIGH (ref <130)
Total CHOL/HDL Ratio: 5 Ratio (ref <=5.0)
Triglycerides: 158 mg/dL — ABNORMAL HIGH (ref <150)
VLDL: 32 mg/dL — ABNORMAL HIGH (ref <30)

## 2015-11-06 LAB — COMPREHENSIVE METABOLIC PANEL
ALBUMIN: 3.7 g/dL (ref 3.6–5.1)
ALK PHOS: 79 U/L (ref 33–130)
ALT: 13 U/L (ref 6–29)
AST: 19 U/L (ref 10–35)
BILIRUBIN TOTAL: 0.4 mg/dL (ref 0.2–1.2)
BUN: 16 mg/dL (ref 7–25)
CALCIUM: 9.3 mg/dL (ref 8.6–10.4)
CO2: 29 mmol/L (ref 20–31)
CREATININE: 1.02 mg/dL — AB (ref 0.50–0.99)
Chloride: 104 mmol/L (ref 98–110)
Glucose, Bld: 112 mg/dL — ABNORMAL HIGH (ref 65–99)
Potassium: 4.5 mmol/L (ref 3.5–5.3)
SODIUM: 140 mmol/L (ref 135–146)
TOTAL PROTEIN: 6.2 g/dL (ref 6.1–8.1)

## 2015-11-06 LAB — TSH: TSH: 1.03 mIU/L

## 2015-11-07 LAB — HEMOGLOBIN A1C
Hgb A1c MFr Bld: 6.4 % — ABNORMAL HIGH (ref <5.7)
Mean Plasma Glucose: 137 mg/dL

## 2015-11-10 ENCOUNTER — Other Ambulatory Visit (HOSPITAL_COMMUNITY)
Admission: RE | Admit: 2015-11-10 | Discharge: 2015-11-10 | Disposition: A | Discharge status: Home / Self Care | Payer: Medicare Other | Source: Ambulatory Visit | Attending: Family Medicine | Admitting: Family Medicine

## 2015-11-10 ENCOUNTER — Ambulatory Visit (INDEPENDENT_AMBULATORY_CARE_PROVIDER_SITE_OTHER): Payer: Medicare Other | Admitting: Family Medicine

## 2015-11-10 ENCOUNTER — Encounter: Payer: Self-pay | Admitting: Family Medicine

## 2015-11-10 ENCOUNTER — Other Ambulatory Visit: Payer: Self-pay

## 2015-11-10 VITALS — BP 118/74 | HR 83 | Ht 68.0 in | Wt 281.4 lb

## 2015-11-10 DIAGNOSIS — Z23 Encounter for immunization: Secondary | ICD-10-CM

## 2015-11-10 DIAGNOSIS — I10 Essential (primary) hypertension: Secondary | ICD-10-CM

## 2015-11-10 DIAGNOSIS — E8881 Metabolic syndrome: Secondary | ICD-10-CM

## 2015-11-10 DIAGNOSIS — E669 Obesity, unspecified: Secondary | ICD-10-CM

## 2015-11-10 DIAGNOSIS — E038 Other specified hypothyroidism: Secondary | ICD-10-CM

## 2015-11-10 DIAGNOSIS — R0989 Other specified symptoms and signs involving the circulatory and respiratory systems: Secondary | ICD-10-CM

## 2015-11-10 DIAGNOSIS — Z1151 Encounter for screening for human papillomavirus (HPV): Secondary | ICD-10-CM | POA: Insufficient documentation

## 2015-11-10 DIAGNOSIS — Z8249 Family history of ischemic heart disease and other diseases of the circulatory system: Secondary | ICD-10-CM

## 2015-11-10 DIAGNOSIS — R0789 Other chest pain: Secondary | ICD-10-CM | POA: Diagnosis not present

## 2015-11-10 DIAGNOSIS — E1169 Type 2 diabetes mellitus with other specified complication: Secondary | ICD-10-CM

## 2015-11-10 DIAGNOSIS — Z01419 Encounter for gynecological examination (general) (routine) without abnormal findings: Secondary | ICD-10-CM | POA: Diagnosis not present

## 2015-11-10 DIAGNOSIS — Z0000 Encounter for general adult medical examination without abnormal findings: Secondary | ICD-10-CM | POA: Diagnosis not present

## 2015-11-10 DIAGNOSIS — E785 Hyperlipidemia, unspecified: Secondary | ICD-10-CM

## 2015-11-10 DIAGNOSIS — Z1211 Encounter for screening for malignant neoplasm of colon: Secondary | ICD-10-CM | POA: Diagnosis not present

## 2015-11-10 DIAGNOSIS — Z124 Encounter for screening for malignant neoplasm of cervix: Secondary | ICD-10-CM

## 2015-11-10 LAB — POC HEMOCCULT BLD/STL (OFFICE/1-CARD/DIAGNOSTIC): Fecal Occult Blood, POC: NEGATIVE

## 2015-11-10 MED ORDER — OXYCODONE-ACETAMINOPHEN 5-325 MG PO TABS: ORAL_TABLET | ORAL | 0 refills | Status: DC

## 2015-11-10 NOTE — Progress Notes (Signed)
    [name]     MRN: [id number]      DOB: 1955/10/01  HPI: Patient is in for annual physical exam. 1 month h/o intermittent chest pain and palpaitations. Personal h/o increased CV risk, also both parents had CABG in their early 33's per her reporting C/o intermittent light headedness and heart beating in her right ear, concerned about blockage Refuses statin therapy despite elevated lipid, educated re value of these  Recent labs, are reviewed. Immunization is reviewed , and  updated    PE: Pleasant  female, alert and oriented x 3, in no cardio-pulmonary distress. Afebrile. HEENT No facial trauma or asymetry. Sinuses non tender.  Extra occullar muscles intact,External ears normal, tympanic membranes clear. Oropharynx moist, no exudate. Neck: supple, no adenopathy,JVD or thyromegaly. bruits.  Chest: Clear to ascultation bilaterally.No crackles or wheezes. Non tender to palpation  Breast: No asymetry,no masses or lumps. No tenderness. No nipple discharge or inversion. No axillary or supraclavicular adenopathy  Cardiovascular system; Heart sounds normal,  S1 and  S2 ,no S3.  No murmur, or thrill. Apical beat not displaced Peripheral pulses normal.  Abdomen: Soft, non tender, no organomegaly or masses. No bruits. Bowel sounds normal. No guarding, tenderness or rebound.  Rectal:  Normal sphincter tone. No rectal mass. Guaiac negative stool.  GU: External genitalia normal female genitalia , normal female distribution of hair. No lesions. Urethral meatus normal in size, no  Prolapse, no lesions visibly  Present. Bladder non tender. Vagina pink and moist , with no visible lesions , discharge present . Adequate pelvic support no  cystocele or rectocele noted Cervix pink and appears healthy, no lesions or ulcerations noted, no discharge noted from os Uterus normal size, no adnexal masses, no cervical motion or adnexal tenderness.   MS: decreased  ROM of spine,  adeaaute in  hips , shoulders and knees.  No muscle wasting or atrophy.   Neurologic: Cranial nerves 2 to 12 intact. Power, tone ,sensation and reflexes normal throughout. No disturbance in gait. No tremor.  Skin: Intact, no ulceration, erythema , scaling or rash noted. Pigmentation normal throughout  Psych; Normal mood and affect. Judgement and concentration normal   Assessment & Plan:  Annual physical exam Annual exam as documented. Counseling done  re healthy lifestyle involving commitment to 150 minutes exercise per week, heart healthy diet, and attaining healthy weight.The importance of adequate sleep also discussed. Regular seat belt use and home safety, is also discussed. Changes in health habits are decided on by the patient with goals and time frames  set for achieving them. Immunization and cancer screening needs are specifically addressed at this visit.   Need for prophylactic vaccination and inoculation against influenza After obtaining informed consent, the vaccine is  administered by LPN.   Metabolic syndrome X High CV risk based on co morbidities and f/h of premature CAD in both parents, father had bypass in early 33's , mother had bypass in early 1's  Other chest pain One month h/o intermittent chest pain, palpitation and high CV risk based on personal and family history Office EKG done: NSR, no ischemia, will still have cardiology further eval  Carotid bruit present Bruit with neck pain and pounding in ear, high risk of vascular disease, carotid doppler to further eval

## 2015-11-10 NOTE — Assessment & Plan Note (Signed)
High CV risk based on co morbidities and f/h of premature CAD in both parents, father had bypass in early 55's , mother had bypass in early 58's

## 2015-11-10 NOTE — Assessment & Plan Note (Signed)
Bruit with neck pain and pounding in ear, high risk of vascular disease, carotid doppler to further eval

## 2015-11-10 NOTE — Assessment & Plan Note (Signed)
One month h/o intermittent chest pain, palpitation and high CV risk based on personal and family history Office EKG done: NSR, no ischemia, will still have cardiology further eval

## 2015-11-10 NOTE — Patient Instructions (Signed)
F/u in 4.5 month, call if you need me sooner  Fasting lipid, cmp and eGFR, hBA1C and tSH in 4.5 month  You are referred for an [place] of your neck arteries and to cardiology in [place], due to c/o intermittent chest pain, irregular heart rate, high risk of cV disease based on personal and family history  eKG today  Flu vaccine today  Please ger diabetic eye exam and have report sent her  No sensation in feet, need to examine daily  Please work on good  health habits so that your health will improve. 1. Commitment to daily physical activity for 30 to 60  minutes, if you are able to do this.  2. Commitment to wise food choices. Aim for half of your  food intake to be vegetable and fruit, one quarter starchy foods, and one quarter protein. Try to eat on a regular schedule  3 meals per day, snacking between meals should be limited to vegetables or fruits or small portions of nuts. 64 ounces of water per day is generally recommended, unless you have specific health conditions, like heart failure or kidney failure where you will need to limit fluid intake.  3. Commitment to sufficient and a  good quality of physical and mental rest daily, generally between 6 to 8 hours per day.  WITH PERSISTANCE AND PERSEVERANCE, THE IMPOSSIBLE , BECOMES THE NORM!

## 2015-11-10 NOTE — Assessment & Plan Note (Signed)
After obtaining informed consent, the vaccine is  administered by LPN.  

## 2015-11-10 NOTE — Assessment & Plan Note (Signed)

## 2015-11-12 LAB — CYTOLOGY - PAP
Diagnosis: NEGATIVE
HPV (WINDOPATH): NOT DETECTED

## 2015-11-13 ENCOUNTER — Ambulatory Visit (INDEPENDENT_AMBULATORY_CARE_PROVIDER_SITE_OTHER): Payer: Medicare Other | Admitting: Physician Assistant

## 2015-11-13 ENCOUNTER — Encounter: Payer: Self-pay | Admitting: Physician Assistant

## 2015-11-13 VITALS — BP 128/72 | HR 75 | Ht 68.0 in | Wt 279.4 lb

## 2015-11-13 DIAGNOSIS — E1169 Type 2 diabetes mellitus with other specified complication: Secondary | ICD-10-CM | POA: Diagnosis not present

## 2015-11-13 DIAGNOSIS — R072 Precordial pain: Secondary | ICD-10-CM | POA: Diagnosis not present

## 2015-11-13 DIAGNOSIS — I10 Essential (primary) hypertension: Secondary | ICD-10-CM | POA: Diagnosis not present

## 2015-11-13 DIAGNOSIS — E785 Hyperlipidemia, unspecified: Secondary | ICD-10-CM | POA: Diagnosis not present

## 2015-11-13 MED ORDER — ATORVASTATIN CALCIUM 40 MG PO TABS: 40.0000 mg | ORAL_TABLET | Freq: Every day | ORAL | 3 refills | Status: DC

## 2015-11-13 NOTE — Progress Notes (Signed)
Cardiology Office Note   Date:  11/13/2015   ID:  [name], [name] 02/12/1955, MRN [id number]  PCP:  Tula Nakayama, MD  Cardiologist:  Dr. Meredeth Ide, PA-C   Chief Complaint  Patient presents with  . Chest Pain  . Panic Attack    History of Present Illness: [name] is a 60 y.o. female with a history of DM, HTN, HLD, FH CAD, morbid obesity. Thyroid dz, arthritis  Seen by Dr Moshe Cipro and pt had been having chest pain, eval needed.   [name] presents for Evaluation of chest pain.  Ms. Underkoffler is disabled secondary to back issues. Because of her musculoskeletal issues, she is not able to walk very much or climb stairs. She is able to do housework including vacuuming and mopping. When she does these things, she never gets chest pain. She has some chronic dyspnea on exertion that has not changed recently. Her weight has not changed recently.  She has been under a great deal of stress recently secondary to family issues and her husband needing help with his business. When she is under a great deal of stress, she will get substernal chest pain that radiates up into the right side of her neck. It is not positional.   She becomes even more anxious when she feels the pain, because she thinks it is similar to the symptoms are mother was having before she was diagnosed with cardiac issues. She will sit down and rest and call herself, and the pain resolves and 30 minutes or less. It reaches a 4 or 5/10. Additionally, she will have the same pain she describes but as a flash pain. It will come, and then go in just a second or 2.  The chest pain is not like the burning she has when it is her reflux. She has never had it except when she is under emotional stress.  She has no significant problems with lower extremity edema. She has some orthopnea that may be related to body habitus, but denies PND. Her musculoskeletal issues have been worse recently, with back pain and  neck pain.   Past Medical History:  Diagnosis Date  . Arthritis   . Diabetes mellitus without complication ([place]) [id number]  . GERD (gastroesophageal reflux disease)   . Hyperlipidemia   . Hypertension 2011  . Morbid obesity ([employer])   . Thyroid disease     Past Surgical History:  Procedure Laterality Date  . ABDOMINAL HYSTERECTOMY    . APPENDECTOMY    . CHOLECYSTECTOMY    . TONSILLECTOMY      Current Outpatient Prescriptions  Medication Sig Dispense Refill  . aspirin 81 MG tablet Take 81 mg by mouth daily.      . benazepril (LOTENSIN) 10 MG tablet Take 1 tablet (10 mg total) by mouth daily. 30 tablet 5  . calcium-vitamin D (OSCAL WITH D) 500-200 MG-UNIT per tablet Take 1 tablet by mouth daily.    . cyclobenzaprine (FLEXERIL) 5 MG tablet TAKE ONE TABLET BY MOUTH AT BEDTIME AS NEEDED. SHOULD LAST SIX MONTHS. 30 tablet 0  . Dexlansoprazole (DEXILANT) 30 MG capsule Take 30 mg by mouth daily.    Marland Kitchen gabapentin (NEURONTIN) 300 MG capsule TAKE ONE CAPSULE BY MOUTH THREE TIMES DAILY. 90 capsule 3  . ibuprofen (ADVIL,MOTRIN) 800 MG tablet Take 1 tablet (800 mg total) by mouth every 8 (eight) hours as needed. 30 tablet 0  . levothyroxine (SYNTHROID, LEVOTHROID) 150 MCG tablet Take  1 tablet (150 mcg total) by mouth daily. 30 tablet 5  . metFORMIN (GLUCOPHAGE) 500 MG tablet TAKE ONE TABLET BY MOUTH ONCE DAILY. 30 tablet 3  . oxyCODONE-acetaminophen (ROXICET) 5-325 MG tablet One tablet twice weekly as needed, for uncontrolled back pain. 40 tablet 0  . traZODone (DESYREL) 100 MG tablet TAKE ONE TABLET BY MOUTH AT BEDTIME. 30 tablet 2   Current Facility-Administered Medications  Medication Dose Route Frequency Provider Last Rate Last Dose  . Influenza (>/= 3 years) inactive virus vaccine (FLVIRIN/FLUZONE) injection SUSP 0.5 mL  0.5 mL Intramuscular Once Fayrene Helper, MD        Allergies:   Naproxen sodium    Social History:  The patient  reports that she has never smoked. She has never used  smokeless tobacco. She reports that she does not drink alcohol or use drugs.   Family History:  The patient's family history includes Asthma in her mother; Cancer in her maternal grandmother, paternal grandfather, sister, and sister; Diabetes in her father; Heart disease (age of onset: 45) in her mother; Heart disease (age of onset: 95) in her father; Hypertension in her father and mother.    ROS:  Please see the history of present illness. All other systems are reviewed and negative.    PHYSICAL EXAM: VS:  BP 128/72   Pulse 75   Ht 5\' 8"  (1.727 m)   Wt 279 lb 6.4 oz (126.7 kg)   BMI 42.48 kg/m  , BMI Body mass index is 42.48 kg/m. GEN: Well nourished, well developed, female in no acute distress  HEENT: normal for age  Neck: no JVD, soft carotid bruits, no masses Cardiac: RRR; no murmur, no rubs, or gallops Respiratory:  clear to auscultation bilaterally, normal work of breathing GI: soft, nontender, nondistended, + BS MS: no deformity or atrophy; no edema; distal pulses are 2+ in all 4 extremities   Skin: warm and dry, no rash Neuro:  Strength and sensation are intact Psych: euthymic mood, full affect   EKG:  EKG is ordered today. The ekg ordered today demonstrates sinus rhythm, heart rate 75, no significant abnormalities, no acute ischemic changes or Q waves   Recent Labs: 11/06/2015: ALT 13; BUN 16; Creat 1.02; Potassium 4.5; Sodium 140; TSH 1.03    Lipid Panel    Component Value Date/Time   CHOL 204 (H) 11/06/2015 0807   TRIG 158 (H) 11/06/2015 0807   HDL 41 (L) 11/06/2015 0807   CHOLHDL 5.0 11/06/2015 0807   VLDL 32 (H) 11/06/2015 0807   LDLCALC 131 (H) 11/06/2015 0807     Wt Readings from Last 3 Encounters:  11/13/15 279 lb 6.4 oz (126.7 kg)  11/10/15 281 lb 6.4 oz (127.6 kg)  06/04/15 280 lb (127 kg)     Other studies Reviewed: Additional studies/ records that were reviewed today include: Office notes.  ASSESSMENT AND PLAN: Dr. Claiborne Billings saw the patient and  formulated the plan  1.  Chest pain: Her symptoms are atypical, but she has multiple cardiac risk factors including diabetes, hypertension, hyperlipidemia and strong family history of premature coronary artery disease. Further testing is indicated, we will order a Lexi scan Myoview. It will be a 2 day study. She is already on aspirin and an ACE inhibitor. Her blood pressure is well controlled. If her Myoview is positive, she will need to be started on a beta blocker.  2. Hypertension: Her blood pressure is generally well controlled, continue current medical therapy per Dr. Moshe Cipro. She  is likely to have diastolic dysfunction from a history of hypertension, as she was not started on medications until 4 or 5 years ago. We will check an echocardiogram as she has never had an EF assessment.  3. Hyperlipidemia: The patient has been requested to take a statin in the past, but she is very reluctant to do so. She has tried to make dietary changes, but those are not sufficient. We gave her several options on statins, and she prefers to try Lipitor 40 mg daily.   Current medicines are reviewed at length with the patient today.  The patient does not have concerns regarding medicines.  The following changes have been made:  Add Lipitor  Labs/ tests ordered today include:   Orders Placed This Encounter  Procedures  . Myocardial Perfusion Imaging  . EKG 12-Lead  . ECHOCARDIOGRAM COMPLETE     Disposition:   FU with Dr. Claiborne Billings or myself after the testing  Signed, Lenoard Aden  11/13/2015 2:43 PM    [place] Phone: [phone]; Fax: [phone]  This note was written with the assistance of speech recognition software. Please excuse any transcriptional errors.   Patient seen and examined. Agree with assessment and plan. Ms. Guerriero is a 60 year old female has a history of diabetes mellitus, hypertension, hyperlipidemia with noticeable xanthelasmas, morbid  obesity, family history for CAD, who has developed episodes of chest pain.  She has significant cardiac risk factors.  She has been under increased stress.  She has developed episodes of chest discomfort with some atypical features.  Lipid studies are elevated and on physical exam, she has xanthelasmas above, both eyelids and has not been on any lipid-lowering therapy.  She has a history of diastolic dysfunction associated with her hypertension.  With her significant risk factors, I have recommended initial noninvasive screening of her chest discomfort, to make certain this is not an ischemic etiology.  She will be scheduled for a 2 day protocol Lexiscan Myoview study in light of her large size.  We will also start high post he statin therapy initially with atorvastatin 40 mg.  A 2-D echo Doppler study will be scheduled to assess systolic and diastolic function and hypertensive heart disease.  She will be seen in the office in follow-up in of the above studies.   Troy Sine, MD, [facility] 11/20/2015 1:53 PM

## 2015-11-13 NOTE — Patient Instructions (Signed)
Medications:  Minimize Ibuprofen usage.  START Atorvastatin 40 mg daily at bedtime.   Testing:  Your physician has requested that you have an echocardiogram. Echocardiography is a painless test that uses sound waves to create images of your heart. It provides your doctor with information about the size and shape of your heart and how well your heart's chambers and valves are working. This procedure takes approximately one hour. There are no restrictions for this procedure.  Your physician has requested that you have a lexiscan myoview. For further information please visit [email or web address]. Please follow instruction sheet, as given.  Pharmacologic Stress Electrocardiogram A pharmacologic stress electrocardiogram is a heart (cardiac) test that uses nuclear imaging to evaluate the blood supply to your heart. This test may also be called a pharmacologic stress electrocardiography. Pharmacologic means that a medicine is used to increase your heart rate and blood pressure.  This stress test is done to find areas of poor blood flow to the heart by determining the extent of coronary artery disease (CAD). Some people exercise on a treadmill, which naturally increases the blood flow to the heart. For those people unable to exercise on a treadmill, a medicine is used. This medicine stimulates your heart and will cause your heart to beat harder and more quickly, as if you were exercising.  Pharmacologic stress tests can help determine:  The adequacy of blood flow to your heart during increased levels of activity in order to clear you for discharge home.  The extent of coronary artery blockage caused by CAD.  Your prognosis if you have suffered a heart attack.  The effectiveness of cardiac procedures done, such as an angioplasty, which can increase the circulation in your coronary arteries.  Causes of chest pain or pressure. LET [facility] CARE PROVIDER KNOW ABOUT:  Any allergies you have.  All  medicines you are taking, including vitamins, herbs, eye drops, creams, and over-the-counter medicines.  Previous problems you or members of your family have had with the use of anesthetics.  Any blood disorders you have.  Previous surgeries you have had.  Medical conditions you have.  Possibility of pregnancy, if this applies.  If you are currently breastfeeding. RISKS AND COMPLICATIONS Generally, this is a safe procedure. However, as with any procedure, complications can occur. Possible complications include:  You develop pain or pressure in the following areas:  Chest.  Jaw or neck.  Between your shoulder blades.  Radiating down your left arm.  Headache.  Dizziness or light-headedness.  Shortness of breath.  Increased or irregular heartbeat.  Low blood pressure.  Nausea or vomiting.  Flushing.  Redness going up the arm and slight pain during injection of medicine.  Heart attack (rare). BEFORE THE PROCEDURE   Avoid all forms of caffeine for 24 hours before your test or as directed by your health care provider. This includes coffee, tea (even decaffeinated tea), caffeinated sodas, chocolate, cocoa, and certain pain medicines.  Follow your health care provider's instructions regarding eating and drinking before the test.  Take your medicines as directed at regular times with water unless instructed otherwise. Exceptions may include:  If you have diabetes, ask how you are to take your insulin or pills. It is common to adjust insulin dosing the morning of the test.  If you are taking beta-blocker medicines, it is important to talk to your health care provider about these medicines well before the date of your test. Taking beta-blocker medicines may interfere with the test. In some cases, these  medicines need to be changed or stopped 24 hours or more before the test.  If you wear a nitroglycerin patch, it may need to be removed prior to the test. Ask your health  care provider if the patch should be removed before the test.  If you use an inhaler for any breathing condition, bring it with you to the test.  If you are an outpatient, bring a snack so you can eat right after the stress phase of the test.  Do not smoke for 4 hours prior to the test or as directed by your health care provider.  Do not apply lotions, powders, creams, or oils on your chest prior to the test.  Wear comfortable shoes and clothing. Let your health care provider know if you were unable to complete or follow the preparations for your test. PROCEDURE   Multiple patches (electrodes) will be put on your chest. If needed, small areas of your chest may be shaved to get better contact with the electrodes. Once the electrodes are attached to your body, multiple wires will be attached to the electrodes, and your heart rate will be monitored.  An IV access will be started. A nuclear trace (isotope) is given. The isotope may be given intravenously, or it may be swallowed. Nuclear refers to several types of radioactive isotopes, and the nuclear isotope lights up the arteries so that the nuclear images are clear. The isotope is absorbed by your body. This results in low radiation exposure.  A resting nuclear image is taken to show how your heart functions at rest.  A medicine is given through the IV access.  A second scan is done about 1 hour after the medicine injection and determines how your heart functions under stress.  During this stress phase, you will be connected to an electrocardiogram machine. Your blood pressure and oxygen levels will be monitored. AFTER THE PROCEDURE   Your heart rate and blood pressure will be monitored after the test.  You may return to your normal schedule, including diet,activities, and medicines, unless your health care provider tells you otherwise.   This information is not intended to replace advice given to you by your health care provider. Make  sure you discuss any questions you have with your health care provider.   Document Released: 05/30/2008 Document Revised: 01/16/2013 Document Reviewed: 09/18/2012 Elsevier Interactive Patient Education 2016 [employer].  Follow-Up:  Your physician recommends that you schedule a follow-up appointment in: 2 months with Dr. Claiborne Billings or Rosaria Ferries, PA-C.  If you need a refill on your cardiac medications before your next appointment, please call your pharmacy.

## 2015-11-25 ENCOUNTER — Telehealth (HOSPITAL_COMMUNITY): Payer: Self-pay

## 2015-11-25 NOTE — Telephone Encounter (Signed)
Encounter complete. 

## 2015-11-27 ENCOUNTER — Ambulatory Visit (HOSPITAL_COMMUNITY)
Admission: RE | Admit: 2015-11-27 | Discharge: 2015-11-27 | Disposition: A | Discharge status: Home / Self Care | Payer: Medicare Other | Source: Ambulatory Visit | Attending: Cardiology | Admitting: Cardiology

## 2015-11-27 DIAGNOSIS — R072 Precordial pain: Secondary | ICD-10-CM | POA: Diagnosis not present

## 2015-11-27 MED ORDER — TECHNETIUM TC 99M TETROFOSMIN IV KIT
30.3000 | PACK | Freq: Once | INTRAVENOUS | Status: AC | PRN
  Administered 2015-11-27: 30.3 via INTRAVENOUS
  Filled 2015-11-27: qty 31

## 2015-11-27 MED ORDER — REGADENOSON 0.4 MG/5ML IV SOLN
0.4000 mg | Freq: Once | INTRAVENOUS | Status: AC
  Administered 2015-11-27: 0.4 mg via INTRAVENOUS

## 2015-11-28 ENCOUNTER — Ambulatory Visit (HOSPITAL_COMMUNITY)
Admission: RE | Admit: 2015-11-28 | Discharge: 2015-11-28 | Disposition: A | Discharge status: Home / Self Care | Payer: Medicare Other | Source: Ambulatory Visit | Attending: Cardiology | Admitting: Cardiology

## 2015-11-28 LAB — MYOCARDIAL PERFUSION IMAGING
CHL CUP NUCLEAR SRS: 0
CHL CUP RESTING HR STRESS: 72 {beats}/min
LV dias vol: 99 mL (ref 46–106)
LV sys vol: 45 mL
Peak HR: 90 {beats}/min
SDS: 1
SSS: 1
TID: 0.96

## 2015-11-28 MED ORDER — TECHNETIUM TC 99M TETROFOSMIN IV KIT
29.9000 | PACK | Freq: Once | INTRAVENOUS | Status: AC | PRN
  Administered 2015-11-28: 29.9 via INTRAVENOUS

## 2015-12-03 ENCOUNTER — Other Ambulatory Visit: Payer: Self-pay

## 2015-12-03 ENCOUNTER — Ambulatory Visit (HOSPITAL_COMMUNITY): Payer: Medicare Other | Attending: Cardiology

## 2015-12-03 ENCOUNTER — Encounter: Payer: Self-pay | Admitting: *Deleted

## 2015-12-03 DIAGNOSIS — R072 Precordial pain: Secondary | ICD-10-CM | POA: Diagnosis not present

## 2015-12-05 ENCOUNTER — Other Ambulatory Visit: Payer: Self-pay | Admitting: Family Medicine

## 2015-12-05 DIAGNOSIS — I10 Essential (primary) hypertension: Secondary | ICD-10-CM

## 2015-12-05 DIAGNOSIS — E038 Other specified hypothyroidism: Secondary | ICD-10-CM

## 2015-12-09 ENCOUNTER — Ambulatory Visit (INDEPENDENT_AMBULATORY_CARE_PROVIDER_SITE_OTHER): Payer: Medicare Other | Admitting: Family Medicine

## 2015-12-09 ENCOUNTER — Encounter: Payer: Self-pay | Admitting: Family Medicine

## 2015-12-09 VITALS — BP 134/82 | HR 72 | Temp 97.6°F | Resp 18 | Ht 68.0 in | Wt 283.0 lb

## 2015-12-09 DIAGNOSIS — M79674 Pain in right toe(s): Secondary | ICD-10-CM

## 2015-12-09 MED ORDER — METHYLPREDNISOLONE 4 MG PO TBPK: ORAL_TABLET | ORAL | 0 refills | Status: DC

## 2015-12-09 NOTE — Patient Instructions (Signed)
Take the medrol as directed Take all of day one today Limit walking on that foot Call if not improving in a couple of days

## 2015-12-09 NOTE — Progress Notes (Signed)
Chief Complaint  Patient presents with  . Toe Pain    right foot x 4 days  Here for acute visit Very painful toe No trauma, no wound No prior foot problems or Dx of gout No change in diet Otherwise is well Has diabetic neuropathy and decreased sensation feet   Patient Active Problem List   Diagnosis Date Noted  . Other chest pain 11/10/2015  . Carotid bruit present 11/10/2015  . Vertigo 06/23/2015  . Pain of left heel 01/30/2015  . Right hip pain 10/25/2014  . Need for prophylactic vaccination and inoculation against influenza 10/25/2014  . Annual physical exam 08/28/2014  . Metabolic syndrome X [id number]  . Cervical neck pain with evidence of disc disease 10/19/2010  . Diabetes mellitus type 2 in obese ([place]) 01/22/2010  . Hypothyroidism 06/08/2007  . Hyperlipidemia 06/08/2007  . Morbid obesity ([place]) 06/08/2007  . Depression with anxiety 06/08/2007  . Hypertension goal BP (blood pressure) < 130/80 06/08/2007  . GERD 06/08/2007  . Radicular pain of right lower back 06/08/2007    Outpatient Encounter Prescriptions as of 12/09/2015  Medication Sig  . aspirin 81 MG tablet Take 81 mg by mouth daily.    Marland Kitchen atorvastatin (LIPITOR) 40 MG tablet Take 1 tablet (40 mg total) by mouth daily.  . benazepril (LOTENSIN) 5 MG tablet TAKE TWO (2) TABLETS BY MOUTH DAILY.  . calcium-vitamin D (OSCAL WITH D) 500-200 MG-UNIT per tablet Take 1 tablet by mouth daily.  . cyclobenzaprine (FLEXERIL) 5 MG tablet TAKE ONE TABLET BY MOUTH AT BEDTIME AS NEEDED. SHOULD LAST SIX MONTHS.  Marland Kitchen Dexlansoprazole (DEXILANT) 30 MG capsule Take 30 mg by mouth daily.  Marland Kitchen gabapentin (NEURONTIN) 300 MG capsule TAKE ONE CAPSULE BY MOUTH THREE TIMES DAILY.  Marland Kitchen ibuprofen (ADVIL,MOTRIN) 800 MG tablet Take 1 tablet (800 mg total) by mouth every 8 (eight) hours as needed.  Marland Kitchen levothyroxine (SYNTHROID, LEVOTHROID) 150 MCG tablet TAKE ONE TABLET BY MOUTH DAILY.  . metFORMIN (GLUCOPHAGE) 500 MG tablet TAKE ONE TABLET BY  MOUTH ONCE DAILY.  Marland Kitchen oxyCODONE-acetaminophen (ROXICET) 5-325 MG tablet One tablet twice weekly as needed, for uncontrolled back pain.  . traZODone (DESYREL) 100 MG tablet TAKE ONE TABLET BY MOUTH AT BEDTIME.  . methylPREDNISolone (MEDROL DOSEPAK) 4 MG TBPK tablet Tad, take all of day one today   Facility-Administered Encounter Medications as of 12/09/2015  Medication  . Influenza (>/= 3 years) inactive virus vaccine (FLVIRIN/FLUZONE) injection SUSP 0.5 mL    Allergies  Allergen Reactions  . Naproxen Sodium Hives and Swelling    MD thought it was the pink dye in the naproxen    Review of Systems  Constitutional: Negative for activity change, appetite change and fever.  Respiratory: Negative for cough and shortness of breath.   Cardiovascular: Negative for chest pain, palpitations and leg swelling.  Gastrointestinal: Negative for constipation and diarrhea.  Genitourinary: Negative for difficulty urinating and frequency.  Musculoskeletal: Positive for arthralgias and gait problem.  Neurological: Negative for dizziness and headaches.  Psychiatric/Behavioral: Positive for sleep disturbance. The patient is not nervous/anxious.     BP 134/82 (BP Location: Right Arm, Patient Position: Sitting, Cuff Size: Large)   Pulse 72   Temp 97.6 F (36.4 C) (Oral)   Resp 18   Ht 5\' 8"  (1.727 m)   Wt 283 lb (128.4 kg)   BMI 43.03 kg/m   Physical Exam  Constitutional: She is oriented to person, place, and time. She appears well-developed and well-nourished. No distress.  Antalgic gait  HENT:  Head: Normocephalic and atraumatic.  Mouth/Throat: Oropharynx is clear and moist.  Cardiovascular: Normal rate, regular rhythm and normal heart sounds.   Pulmonary/Chest: Effort normal and breath sounds normal.  Musculoskeletal:       Feet:  Erythema, joint swelling, VERY tender, no open skin or wound  Neurological: She is alert and oriented to person, place, and time.  Skin: There is erythema.    Psychiatric: She has a normal mood and affect. Her behavior is normal. Thought content normal.    ASSESSMENT/PLAN:  1. Toe pain, right Suspect gout   Patient Instructions  Take the medrol as directed Take all of day one today Limit walking on that foot Call if not improving in a couple of days   Raylene Everts, MD

## 2016-01-07 ENCOUNTER — Encounter: Payer: Self-pay | Admitting: Cardiovascular Disease

## 2016-01-07 ENCOUNTER — Other Ambulatory Visit: Payer: Self-pay | Admitting: Family Medicine

## 2016-01-07 ENCOUNTER — Ambulatory Visit (INDEPENDENT_AMBULATORY_CARE_PROVIDER_SITE_OTHER): Payer: Medicare Other | Admitting: Cardiovascular Disease

## 2016-01-07 VITALS — BP 123/80 | HR 88 | Ht 68.0 in | Wt 285.6 lb

## 2016-01-07 DIAGNOSIS — IMO0001 Reserved for inherently not codable concepts without codable children: Secondary | ICD-10-CM

## 2016-01-07 DIAGNOSIS — E1169 Type 2 diabetes mellitus with other specified complication: Secondary | ICD-10-CM

## 2016-01-07 DIAGNOSIS — E785 Hyperlipidemia, unspecified: Secondary | ICD-10-CM

## 2016-01-07 DIAGNOSIS — K219 Gastro-esophageal reflux disease without esophagitis: Secondary | ICD-10-CM

## 2016-01-07 DIAGNOSIS — E038 Other specified hypothyroidism: Secondary | ICD-10-CM

## 2016-01-07 DIAGNOSIS — R0789 Other chest pain: Secondary | ICD-10-CM

## 2016-01-07 DIAGNOSIS — E039 Hypothyroidism, unspecified: Secondary | ICD-10-CM

## 2016-01-07 DIAGNOSIS — G478 Other sleep disorders: Secondary | ICD-10-CM

## 2016-01-07 DIAGNOSIS — E669 Obesity, unspecified: Secondary | ICD-10-CM

## 2016-01-07 DIAGNOSIS — R400 Somnolence: Secondary | ICD-10-CM

## 2016-01-07 DIAGNOSIS — I10 Essential (primary) hypertension: Secondary | ICD-10-CM | POA: Diagnosis not present

## 2016-01-07 NOTE — Patient Instructions (Addendum)
Your physician recommends that you return for lab work in: [place].  Your physician has recommended that you have a sleep study. This test records several body functions during sleep, including: brain activity, eye movement, oxygen and carbon dioxide blood levels, heart rate and rhythm, breathing rate and rhythm, the flow of air through your mouth and nose, snoring, body muscle movements, and chest and belly movement. This will be done at [place].  Your physician recommends that you schedule a follow-up appointment in: April 2018 sleep clinic.

## 2016-01-08 NOTE — Progress Notes (Signed)
Cardiology Office Note    Date:  01/08/2016   ID:  Daana, Petrasek 1955-10-06, MRN [id number]  PCP:  Tula Nakayama, MD  Cardiologist:  Shelva Majestic, MD   Chief Complaint  Patient presents with  . Follow-up  . Leg Pain    cramping in legs    History of Present Illness:  [name] is a 60 y.o. female who presents for a 2 month follow-up cardiology evaluation.  [name] has a history of hypertension, and type 2 diabetes mellitus in addition to GERD.  She had presented to the office on 11/13/2015 and was seen by Rosaria Ferries, PA-C, as well as myself.  At that time, she was under significant amount of stress secondary to family issues, and also had experienced some chest pain radiating to the right side of her neck which was not exertionally precipitated.  Were not evaluation, she was found to have prominence in the [facility] as above arise bilaterally and a significant family history for CAD and was morbidly obese.  She subsequently underwent an echo Doppler study which was done on 12/03/2015 which showed an ejection fraction at 50-55% with grade 1 diastolic dysfunction.  There was mitral annular calcification with trivial MR.  She had normal pulmonary pressures.  A nuclear perfusion study was low risk and showed normal perfusion and function.  Laboratory had revealed elevation of her lipid studies with a total cholesterol 204, triglycerides 158, and LDL 131.  She was started on atorvastatin 40 mg.  She has tolerated this well.  She's not had follow-up laboratory.    Upon further questioning today , she admits to very poor sleep.  She has frequent awakenings, her sleep is nonrestorative, she notes daytime fatigue, and she snores.  He denies any exertional precipitation of chest pain.  She admits to occasional panic attacks and her heart rate increases.  She presents for reevaluation.   Past Medical History:  Diagnosis Date  . Arthritis   . Diabetes mellitus without  complication ([place]) 5397  . GERD (gastroesophageal reflux disease)   . Hyperlipidemia   . Hypertension 2011  . Morbid obesity ([place])   . Thyroid disease     Past Surgical History:  Procedure Laterality Date  . ABDOMINAL HYSTERECTOMY    . APPENDECTOMY    . CHOLECYSTECTOMY    . TONSILLECTOMY      Current Medications: Outpatient Medications Prior to Visit  Medication Sig Dispense Refill  . aspirin 81 MG tablet Take 81 mg by mouth daily.      Marland Kitchen atorvastatin (LIPITOR) 40 MG tablet Take 1 tablet (40 mg total) by mouth daily. 90 tablet 3  . benazepril (LOTENSIN) 5 MG tablet TAKE TWO (2) TABLETS BY MOUTH DAILY. 180 tablet 1  . calcium-vitamin D (OSCAL WITH D) 500-200 MG-UNIT per tablet Take 1 tablet by mouth daily.    . cyclobenzaprine (FLEXERIL) 5 MG tablet TAKE ONE TABLET BY MOUTH AT BEDTIME AS NEEDED. SHOULD LAST SIX MONTHS. 30 tablet 0  . Dexlansoprazole (DEXILANT) 30 MG capsule Take 30 mg by mouth daily.    Marland Kitchen ibuprofen (ADVIL,MOTRIN) 800 MG tablet Take 1 tablet (800 mg total) by mouth every 8 (eight) hours as needed. 30 tablet 0  . levothyroxine (SYNTHROID, LEVOTHROID) 150 MCG tablet TAKE ONE TABLET BY MOUTH DAILY. 90 tablet 1  . metFORMIN (GLUCOPHAGE) 500 MG tablet TAKE ONE TABLET BY MOUTH ONCE DAILY. 30 tablet 3  . methylPREDNISolone (MEDROL DOSEPAK) 4 MG TBPK tablet Tad, take  all of day one today 21 tablet 0  . oxyCODONE-acetaminophen (ROXICET) 5-325 MG tablet One tablet twice weekly as needed, for uncontrolled back pain. 40 tablet 0  . traZODone (DESYREL) 100 MG tablet TAKE ONE TABLET BY MOUTH AT BEDTIME. 30 tablet 2  . gabapentin (NEURONTIN) 300 MG capsule TAKE ONE CAPSULE BY MOUTH THREE TIMES DAILY. 90 capsule 3   Facility-Administered Medications Prior to Visit  Medication Dose Route Frequency Provider Last Rate Last Dose  . Influenza (>/= 3 years) inactive virus vaccine (FLVIRIN/FLUZONE) injection SUSP 0.5 mL  0.5 mL Intramuscular Once Fayrene Helper, MD          Allergies:   Naproxen sodium   Social History   Social History  . Marital status: Divorced    Spouse name: N/A  . Number of children: N/A  . Years of education: N/A   Social History Main Topics  . Smoking status: Never Smoker  . Smokeless tobacco: Never Used  . Alcohol use No  . Drug use: No  . Sexual activity: Not Asked   Other Topics Concern  . None   Social History Narrative  . None     Family History:  The patient's family history includes Asthma in her mother; Cancer in her maternal grandmother, paternal grandfather, sister, and sister; Diabetes in her father; Heart disease (age of onset: 68) in her mother; Heart disease (age of onset: 62) in her father; Hypertension in her father and mother.   ROS General: Negative; No fevers, chills, or night sweats;  HEENT: Negative; No changes in vision or hearing, sinus congestion, difficulty swallowing Pulmonary: Negative; No cough, wheezing, shortness of breath, hemoptysis Cardiovascular: See history of present illness GI: Negative; No nausea, vomiting, diarrhea, or abdominal pain GU: Negative; No dysuria, hematuria, or difficulty voiding Musculoskeletal: Negative; no myalgias, joint pain, or weakness Hematologic/Oncology: Negative; no easy bruising, bleeding Endocrine: Negative; no heat/cold intolerance; no diabetes Neuro: Negative; no changes in balance, headaches Skin: Negative; No rashes or skin lesions Psychiatric: Negative; No behavioral problems, depression Sleep: Positive for snoring, daytime sleepiness, hypersomnolence; no bruxism, restless legs, hypnogognic hallucinations, no cataplexy Other comprehensive 14 point system review is negative.   PHYSICAL EXAM:   VS:  BP 123/80   Pulse 88   Ht '5\' 8"'  (1.727 m)   Wt 285 lb 9.6 oz (129.5 kg)   BMI 43.43 kg/m     Repeat blood pressure by me was 110/70  Wt Readings from Last 3 Encounters:  01/07/16 285 lb 9.6 oz (129.5 kg)  12/09/15 283 lb (128.4 kg)  11/27/15  279 lb (126.6 kg)    General: Alert, oriented, no distress.  Skin: normal turgor, no rashes, warm and dry HEENT: Normocephalic, atraumatic. Pupils equal round and reactive to light; sclera anicteric; extraocular muscles intact; Bilateral xanthelasmas above her upper eyelids Fundi , Disks flat, no hemorrhages or exudates. Nose without nasal septal hypertrophy Mouth/Parynx benign; Mallinpatti scale 3 Neck: No JVD, no carotid bruits; normal carotid upstroke Lungs: clear to ausculatation and percussion; no wheezing or rales Chest wall: without tenderness to palpitation Heart: PMI not displaced, RRR, s1 s2 normal, 1/6 systolic murmur, no diastolic murmur, no rubs, gallops, thrills, or heaves Abdomen: Moderate central adiposity;soft, nontender; no hepatosplenomehaly, BS+; abdominal aorta nontender and not dilated by palpation. Back: no CVA tenderness Pulses 2+ Musculoskeletal: full range of motion, normal strength, no joint deformities Extremities: no clubbing cyanosis or edema, Homan's sign negative  Neurologic: grossly nonfocal; Cranial nerves grossly wnl Psychologic: Normal mood and  affect   Studies/Labs Reviewed:   ECG (independently read by me): Normal sinus rhythm at 88 bpm.  Normal intervals.  No ST segment changes.  Recent Labs: BMP Latest Ref Rng & Units 11/06/2015 05/30/2015 01/24/2015  Glucose 65 - 99 mg/dL 112(H) 101(H) 112(H)  BUN 7 - 25 mg/dL '16 24 20  ' Creatinine 0.50 - 0.99 mg/dL 1.02(H) 0.97 1.03  Sodium 135 - 146 mmol/L 140 139 138  Potassium 3.5 - 5.3 mmol/L 4.5 5.3 4.6  Chloride 98 - 110 mmol/L 104 101 101  CO2 20 - 31 mmol/L '29 28 28  ' Calcium 8.6 - 10.4 mg/dL 9.3 9.4 9.3    Hepatic Function Latest Ref Rng & Units 11/06/2015 05/30/2015 01/24/2015  Total Protein 6.1 - 8.1 g/dL 6.2 6.6 6.9  Albumin 3.6 - 5.1 g/dL 3.7 3.8 4.0  AST 10 - 35 U/L '19 17 20  ' ALT 6 - 29 U/L '13 17 19  ' Alk Phosphatase 33 - 130 U/L 79 92 89  Total Bilirubin 0.2 - 1.2 mg/dL 0.4 0.4 0.5   Bilirubin, Direct 0.0 - 0.3 mg/dL - - -    CBC Latest Ref Rng & Units 07/02/2013 01/15/2010 09/17/2009  WBC 4.0 - 10.5 K/uL 8.4 8.8 10.1  Hemoglobin 12.0 - 15.0 g/dL 13.7 13.9 14.4  Hematocrit 36.0 - 46.0 % 40.1 43.2 44.5  Platelets 150 - 400 K/uL 395 396 418(H)   Lab Results  Component Value Date   MCV 88.3 07/02/2013   MCV 93.9 01/15/2010   MCV 94.7 09/17/2009   Lab Results  Component Value Date   TSH 1.03 11/06/2015   Lab Results  Component Value Date   HGBA1C 6.4 (H) 11/06/2015    BNP No results found for: BNP  ProBNP No results found for: PROBNP   Lipid Panel     Component Value Date/Time   CHOL 204 (H) 11/06/2015 0807   TRIG 158 (H) 11/06/2015 0807   HDL 41 (L) 11/06/2015 0807   CHOLHDL 5.0 11/06/2015 0807   VLDL 32 (H) 11/06/2015 0807   LDLCALC 131 (H) 11/06/2015 9702     RADIOLOGY: No results found.   Additional studies/ records that were reviewed today include:  I reviewed her prior evaluation.  I reviewed her echo Doppler study, nuclear perfusion study, and prior laboratory.    ASSESSMENT:    1. Hypertension goal BP (blood pressure) < 130/80   2. Other specified hypothyroidism   3. Diabetes mellitus type 2 in obese ([place])   4. Hyperlipidemia, unspecified hyperlipidemia type   5. Other chest pain   6. Unrefreshed by sleep   7. Daytime somnolence   8. Morbid obesity ([place])   9. Hypothyroidism, unspecified type      PLAN:  [name] is a 60 year old female who has a history of morbid obesity, type 2 diabetes mellitus, hypertension, hyperlipidemia, hypothyroidism, and family history for coronary artery disease.  She had recently developed somewhat atypical chest pain.  Her nuclear perfusion study was reviewed with her in detail and shows normal perfusion and function.  Her echo Doppler study confirms normal systolic function with grade 1 diastolic dysfunction.  When she was last seen, she was started on lipid-lowering therapy with her.   Cholesterol elevation and xanthelasmas.  Target LDL is less than 70 in this diabetic female.  Her blood pressure today is well controlled on as a pill 5 mg.  She is on levothyroxine 150 g for hypothyroidism.  She currently is taking metformin 500 mg twice a  day for type 2 diabetes mellitus.  She has GERD which is controlled with excellent.  She also has a peripheral neuropathy, currently on gabapentin.  Her history is suggestive of obstructive sleep apnea by virtue of poor sleep, frequent awakenings, daytime sleepiness, and snoring.  I'm scheduling her for a diagnostic sleep study for further evaluation.  Follow-up laboratory 100 medical regimen will be obtained.  I discussed the importance of weight loss.  I discussed exercising at least 5 days per week for a minimum of 30 minutes.  I will see her in several months for follow-up evaluation.   Medication Adjustments/Labs and Tests Ordered: Current medicines are reviewed at length with the patient today.  Concerns regarding medicines are outlined above.  Medication changes, Labs and Tests ordered today are listed in the Patient Instructions below. Patient Instructions  Your physician recommends that you return for lab work in: [place].  Your physician has recommended that you have a sleep study. This test records several body functions during sleep, including: brain activity, eye movement, oxygen and carbon dioxide blood levels, heart rate and rhythm, breathing rate and rhythm, the flow of air through your mouth and nose, snoring, body muscle movements, and chest and belly movement. This will be done at [facility].  Your physician recommends that you schedule a follow-up appointment in: April 2018 sleep clinic.       Signed, Shelva Majestic, MD  01/08/2016 2:00 PM    [name] [address], [place], [place], [place]  [address] Phone: [phone]

## 2016-01-13 DIAGNOSIS — E038 Other specified hypothyroidism: Secondary | ICD-10-CM | POA: Diagnosis not present

## 2016-01-13 DIAGNOSIS — I10 Essential (primary) hypertension: Secondary | ICD-10-CM | POA: Diagnosis not present

## 2016-01-13 DIAGNOSIS — E785 Hyperlipidemia, unspecified: Secondary | ICD-10-CM | POA: Diagnosis not present

## 2016-01-13 DIAGNOSIS — E669 Obesity, unspecified: Secondary | ICD-10-CM | POA: Diagnosis not present

## 2016-01-13 DIAGNOSIS — E1169 Type 2 diabetes mellitus with other specified complication: Secondary | ICD-10-CM | POA: Diagnosis not present

## 2016-01-13 LAB — CBC
HEMATOCRIT: 41.4 % (ref 35.0–45.0)
HEMOGLOBIN: 13.7 g/dL (ref 11.7–15.5)
MCH: 30.3 pg (ref 27.0–33.0)
MCHC: 33.1 g/dL (ref 32.0–36.0)
MCV: 91.6 fL (ref 80.0–100.0)
MPV: 9.5 fL (ref 7.5–12.5)
Platelets: 414 10*3/uL — ABNORMAL HIGH (ref 140–400)
RBC: 4.52 MIL/uL (ref 3.80–5.10)
RDW: 14.2 % (ref 11.0–15.0)
WBC: 10.5 10*3/uL (ref 3.8–10.8)

## 2016-01-13 LAB — COMPREHENSIVE METABOLIC PANEL
ALBUMIN: 3.9 g/dL (ref 3.6–5.1)
ALT: 15 U/L (ref 6–29)
AST: 17 U/L (ref 10–35)
Alkaline Phosphatase: 90 U/L (ref 33–130)
BILIRUBIN TOTAL: 0.6 mg/dL (ref 0.2–1.2)
BUN: 15 mg/dL (ref 7–25)
CALCIUM: 9 mg/dL (ref 8.6–10.4)
CHLORIDE: 103 mmol/L (ref 98–110)
CO2: 27 mmol/L (ref 20–31)
Creat: 0.95 mg/dL (ref 0.50–0.99)
Glucose, Bld: 135 mg/dL — ABNORMAL HIGH (ref 65–99)
POTASSIUM: 4.3 mmol/L (ref 3.5–5.3)
Sodium: 139 mmol/L (ref 135–146)
Total Protein: 6.7 g/dL (ref 6.1–8.1)

## 2016-01-13 LAB — LIPID PANEL
Cholesterol: 143 mg/dL (ref <200)
HDL: 41 mg/dL — AB (ref >50)
LDL CALC: 67 mg/dL (ref <100)
TRIGLYCERIDES: 175 mg/dL — AB (ref <150)
Total CHOL/HDL Ratio: 3.5 Ratio (ref <5.0)
VLDL: 35 mg/dL — ABNORMAL HIGH (ref <30)

## 2016-01-13 LAB — TSH: TSH: 0.92 mIU/L

## 2016-01-13 LAB — HEMOGLOBIN A1C
Hgb A1c MFr Bld: 6.7 % — ABNORMAL HIGH (ref <5.7)
Mean Plasma Glucose: 146 mg/dL

## 2016-02-05 ENCOUNTER — Encounter (HOSPITAL_BASED_OUTPATIENT_CLINIC_OR_DEPARTMENT_OTHER): Payer: Medicare Other

## 2016-02-06 ENCOUNTER — Other Ambulatory Visit: Payer: Self-pay | Admitting: Family Medicine

## 2016-02-06 DIAGNOSIS — F418 Other specified anxiety disorders: Secondary | ICD-10-CM

## 2016-03-01 ENCOUNTER — Ambulatory Visit: Payer: Medicare Other | Admitting: Family Medicine

## 2016-03-19 ENCOUNTER — Telehealth: Payer: Self-pay

## 2016-03-19 DIAGNOSIS — E669 Obesity, unspecified: Secondary | ICD-10-CM

## 2016-03-19 DIAGNOSIS — E1169 Type 2 diabetes mellitus with other specified complication: Secondary | ICD-10-CM | POA: Diagnosis not present

## 2016-03-19 DIAGNOSIS — I10 Essential (primary) hypertension: Secondary | ICD-10-CM | POA: Diagnosis not present

## 2016-03-19 DIAGNOSIS — E785 Hyperlipidemia, unspecified: Secondary | ICD-10-CM | POA: Diagnosis not present

## 2016-03-19 DIAGNOSIS — E039 Hypothyroidism, unspecified: Secondary | ICD-10-CM

## 2016-03-19 LAB — LIPID PANEL
Cholesterol: 141 mg/dL (ref <200)
HDL: 49 mg/dL — ABNORMAL LOW (ref >50)
LDL Cholesterol: 70 mg/dL (ref <100)
Total CHOL/HDL Ratio: 2.9 Ratio (ref <5.0)
Triglycerides: 112 mg/dL (ref <150)
VLDL: 22 mg/dL (ref <30)

## 2016-03-19 LAB — TSH: TSH: 0.45 mIU/L

## 2016-03-19 LAB — COMPLETE METABOLIC PANEL WITH GFR
ALBUMIN: 3.9 g/dL (ref 3.6–5.1)
ALK PHOS: 107 U/L (ref 33–130)
ALT: 18 U/L (ref 6–29)
AST: 15 U/L (ref 10–35)
BUN: 18 mg/dL (ref 7–25)
CO2: 29 mmol/L (ref 20–31)
Calcium: 9.5 mg/dL (ref 8.6–10.4)
Chloride: 102 mmol/L (ref 98–110)
Creat: 1.09 mg/dL — ABNORMAL HIGH (ref 0.50–0.99)
GFR, Est African American: 64 mL/min (ref >=60)
GFR, Est Non African American: 55 mL/min — ABNORMAL LOW (ref >=60)
GLUCOSE: 127 mg/dL — AB (ref 65–99)
Potassium: 5.1 mmol/L (ref 3.5–5.3)
Sodium: 140 mmol/L (ref 135–146)
TOTAL PROTEIN: 6.6 g/dL (ref 6.1–8.1)
Total Bilirubin: 0.6 mg/dL (ref 0.2–1.2)

## 2016-03-19 LAB — HEMOGLOBIN A1C
Hgb A1c MFr Bld: 6.4 % — ABNORMAL HIGH (ref <5.7)
Mean Plasma Glucose: 137 mg/dL

## 2016-03-19 NOTE — Telephone Encounter (Signed)
Labs entered.

## 2016-03-24 ENCOUNTER — Ambulatory Visit (INDEPENDENT_AMBULATORY_CARE_PROVIDER_SITE_OTHER): Payer: Medicare Other | Admitting: Family Medicine

## 2016-03-24 ENCOUNTER — Encounter: Payer: Self-pay | Admitting: Family Medicine

## 2016-03-24 ENCOUNTER — Other Ambulatory Visit (HOSPITAL_COMMUNITY)
Admission: RE | Admit: 2016-03-24 | Discharge: 2016-03-24 | Disposition: A | Discharge status: Home / Self Care | Payer: Medicare Other | Source: Ambulatory Visit | Attending: Family Medicine | Admitting: Family Medicine

## 2016-03-24 VITALS — BP 122/74 | HR 92 | Resp 18 | Ht 68.0 in | Wt 283.0 lb

## 2016-03-24 DIAGNOSIS — N760 Acute vaginitis: Secondary | ICD-10-CM | POA: Insufficient documentation

## 2016-03-24 DIAGNOSIS — I10 Essential (primary) hypertension: Secondary | ICD-10-CM

## 2016-03-24 DIAGNOSIS — N3090 Cystitis, unspecified without hematuria: Secondary | ICD-10-CM

## 2016-03-24 DIAGNOSIS — M5416 Radiculopathy, lumbar region: Secondary | ICD-10-CM

## 2016-03-24 DIAGNOSIS — E669 Obesity, unspecified: Secondary | ICD-10-CM

## 2016-03-24 DIAGNOSIS — E039 Hypothyroidism, unspecified: Secondary | ICD-10-CM | POA: Diagnosis not present

## 2016-03-24 DIAGNOSIS — E1169 Type 2 diabetes mellitus with other specified complication: Secondary | ICD-10-CM | POA: Diagnosis not present

## 2016-03-24 DIAGNOSIS — IMO0001 Reserved for inherently not codable concepts without codable children: Secondary | ICD-10-CM

## 2016-03-24 LAB — POCT URINALYSIS DIPSTICK
Bilirubin, UA: NEGATIVE
Glucose, UA: NEGATIVE
Ketones, UA: NEGATIVE
Leukocytes, UA: NEGATIVE
NITRITE UA: NEGATIVE
PH UA: 5.5
Protein, UA: NEGATIVE
RBC UA: NEGATIVE
Spec Grav, UA: <=1.005
UROBILINOGEN UA: 0.2

## 2016-03-24 MED ORDER — OXYCODONE-ACETAMINOPHEN 5-325 MG PO TABS: ORAL_TABLET | ORAL | 0 refills | Status: DC

## 2016-03-24 NOTE — Progress Notes (Signed)
[name]     MRN: [id number]      DOB: 02/21/1955   HPI [name] is here for follow up and re-evaluation of chronic medical conditions, medication management and review of any available recent lab and radiology data.  Preventive health is updated, specifically  Cancer screening and Immunization.   Questions or concerns regarding consultations or procedures which the PT has had in the interim are  addressed. The PT denies any adverse reactions to current medications since the last visit.  One month h/o dark urine and malodorous vaginal discharge. No fever , chills or flank pain C/o painful right 3rd toe, was black, no longer discolored. C/o boil in left armpit approx 3 weeks ago, responded to topical antibiotic, drained pus reportedly, does shave Denies polyuria, polydipsia, blurred vision , or hypoglycemic episodes.   ROS Denies recent fever or chills. Denies sinus pressure, nasal congestion, ear pain or sore throat. Denies chest congestion, productive cough or wheezing. Denies chest pains, palpitations and leg swelling Denies abdominal pain, nausea, vomiting,diarrhea or constipation.   Denies dysuria, frequency, hesitancy or incontinence. Intermittent back and lowe extremity pain with flares on average twice monthly, responds to oxycodone Denies headaches, seizures, numbness, or tingling. Denies depression, anxiety or insomnia. Denies skin break down or rash.   PE  BP 122/74   Pulse 92   Resp 18   Ht 5\' 8"  (1.727 m)   Wt 283 lb 0.6 oz (128.4 kg)   SpO2 96%   BMI 43.04 kg/m   Patient alert and oriented and in no cardiopulmonary distress.  HEENT: No facial asymmetry, EOMI,   oropharynx pink and moist.  Neck supple no JVD, no mass.  Chest: Clear to auscultation bilaterally.  CVS: S1, S2 no murmurs, no S3.Regular rate.  ABD: Soft non tender.   Ext: No edema  MS: Adequate though reduced  ROM lumbar  spine, normal in shoulders, hips and knees Swelling and  tender deformity of right 3rd toe Skin;.erythematous macular rash in left axilla, lateral aspect No rash or discoloration of toe  Psych: Good eye contact, normal affect. Memory intact not anxious or depressed appearing.  CNS: CN 2-12 intact, power,  normal throughout.no focal deficits noted.   Assessment & Plan  Vulvovaginitis One month history, specimens  Sent for testing  Hypertension goal BP (blood pressure) < 130/80 Controlled, no change in medication DASH diet and commitment to daily physical activity for a minimum of 30 minutes discussed and encouraged, as a part of hypertension management. The importance of attaining a healthy weight is also discussed.  BP/Weight 03/24/2016 01/07/2016 12/09/2015 11/27/2015 11/13/2015 11/10/2015 [id number]  Systolic BP 123XX123 [id number] - 0000000 [id number]  Diastolic BP 74 80 82 - 72 74 74  Wt. (Lbs) 283.04 285.6 283 279 279.4 281.4 280  BMI 43.04 43.43 43.03 42.42 42.48 42.79 42.58       Hypothyroidism Controlled, no change in medication   Diabetes mellitus type 2 in obese (HCC) Improved Controlled, no change in medication [name] is reminded of the importance of commitment to daily physical activity for 30 minutes or more, as able and the need to limit carbohydrate intake to 30 to 60 grams per meal to help with blood sugar control.   The need to take medication as prescribed, test blood sugar as directed, and to call between visits if there is a concern that blood sugar is uncontrolled is also discussed.   [name] is reminded of the importance of daily  foot exam, annual eye examination, and good blood sugar, blood pressure and cholesterol control.  Diabetic Labs Latest Ref Rng & Units 03/19/2016 01/13/2016 11/06/2015 06/04/2015 05/30/2015  HbA1c <5.7 % 6.4(H) 6.7(H) 6.4(H) - 6.5(H)  Microalbumin Not estab mg/dL - - - <0.2 -  Micro/Creat Ratio <30 mcg/mg creat - - - SEE NOTE -  Chol <200 mg/dL 141 143 204(H) - 199  HDL >50 mg/dL 49(L) 41(L)  41(L) - 63  Calc LDL <100 mg/dL 70 67 131(H) - 118  Triglycerides <150 mg/dL 112 175(H) 158(H) - 90  Creatinine 0.50 - 0.99 mg/dL 1.09(H) 0.95 1.02(H) - 0.97   BP/Weight 03/24/2016 01/07/2016 12/09/2015 11/27/2015 11/13/2015 11/10/2015 [id number]  Systolic BP 123XX123 [id number] [id number] - 0000000 [id number]  Diastolic BP 74 80 82 - 72 74 74  Wt. (Lbs) 283.04 285.6 283 279 279.4 281.4 280  BMI 43.04 43.43 43.03 42.42 42.48 42.79 42.58   Foot/eye exam completion dates Latest Ref Rng & Units 11/10/2015 04/30/2015  Eye Exam No Retinopathy - -  Foot Form Completion - Done Done        Radicular pain of right lower back Intermittent flares requiring oxycodone on limited basis , pain contract signed and reviewed , she is  prescribed  30 tablets for 4 months  Cystitis without hematuria 1 month h/o symptom, CCuA normal  Morbid obesity Improved Patient re-educated about  the importance of commitment to a  minimum of 150 minutes of exercise per week.  The importance of healthy food choices with portion control discussed. Encouraged to start a food diary, count calories and to consider  joining a support group. Sample diet sheets offered. Goals set by the patient for the next several months.   Weight /BMI 03/24/2016 01/07/2016 12/09/2015  WEIGHT 283 lb 0.6 oz 285 lb 9.6 oz 283 lb  HEIGHT 5\' 8"  5\' 8"  5\' 8"   BMI 43.04 kg/m2 43.43 kg/m2 43.03 kg/m2

## 2016-03-24 NOTE — Assessment & Plan Note (Signed)
Controlled, no change in medication DASH diet and commitment to daily physical activity for a minimum of 30 minutes discussed and encouraged, as a part of hypertension management. The importance of attaining a healthy weight is also discussed.  BP/Weight 03/24/2016 01/07/2016 12/09/2015 11/27/2015 11/13/2015 11/10/2015 [id number]  Systolic BP 123XX123 [id number] Q000111Q - 0000000 123456 [id number]  Diastolic BP 74 80 82 - 72 74 74  Wt. (Lbs) 283.04 285.6 283 279 279.4 281.4 280  BMI 43.04 43.43 43.03 42.42 42.48 42.79 42.58

## 2016-03-24 NOTE — Assessment & Plan Note (Signed)
Improved Controlled, no change in medication [name] is reminded of the importance of commitment to daily physical activity for 30 minutes or more, as able and the need to limit carbohydrate intake to 30 to 60 grams per meal to help with blood sugar control.   The need to take medication as prescribed, test blood sugar as directed, and to call between visits if there is a concern that blood sugar is uncontrolled is also discussed.   [name] is reminded of the importance of daily foot exam, annual eye examination, and good blood sugar, blood pressure and cholesterol control.  Diabetic Labs Latest Ref Rng & Units 03/19/2016 01/13/2016 11/06/2015 06/04/2015 05/30/2015  HbA1c <5.7 % 6.4(H) 6.7(H) 6.4(H) - 6.5(H)  Microalbumin Not estab mg/dL - - - <0.2 -  Micro/Creat Ratio <30 mcg/mg creat - - - SEE NOTE -  Chol <200 mg/dL 141 143 204(H) - 199  HDL >50 mg/dL 49(L) 41(L) 41(L) - 63  Calc LDL <100 mg/dL 70 67 131(H) - 118  Triglycerides <150 mg/dL 112 175(H) 158(H) - 90  Creatinine 0.50 - 0.99 mg/dL 1.09(H) 0.95 1.02(H) - 0.97   BP/Weight 03/24/2016 01/07/2016 12/09/2015 11/27/2015 11/13/2015 11/10/2015 [id number]  Systolic BP 123XX123 [id number] Q000111Q - 0000000 [id number]  Diastolic BP 74 80 82 - 72 74 74  Wt. (Lbs) 283.04 285.6 283 279 279.4 281.4 280  BMI 43.04 43.43 43.03 42.42 42.48 42.79 42.58   Foot/eye exam completion dates Latest Ref Rng & Units 11/10/2015 04/30/2015  Eye Exam No Retinopathy - -  Foot Form Completion - Done Done

## 2016-03-24 NOTE — Assessment & Plan Note (Addendum)
One month history, specimens  Sent for testing

## 2016-03-24 NOTE — Assessment & Plan Note (Signed)
1 month h/o symptom, CCuA normal

## 2016-03-24 NOTE — Assessment & Plan Note (Signed)
Controlled, no change in medication  

## 2016-03-24 NOTE — Assessment & Plan Note (Addendum)
Intermittent flares requiring oxycodone on limited basis , pain contract signed and reviewed , she is  prescribed  30 tablets for 4 months

## 2016-03-24 NOTE — Patient Instructions (Addendum)
  MD follow up with HBA1C, chem 7 and EGFR  And TSH in 4. month  Weight loss goal of 8 to 10 pounds  ONLY ttylenol for pain  No pain medication other than from this office  It is important that you exercise regularly at least 30 minutes 5 times a week. If you develop chest pain, have severe difficulty breathing, or feel very tired, stop exercising immediately and seek medical attention  .  Please work on good  health habits so that your health will improve. 1. Commitment to daily physical activity for 30 to 60  minutes, if you are able to do this.  2. Commitment to wise food choices. Aim for half of your  food intake to be vegetable and fruit, one quarter starchy foods, and one quarter protein. Try to eat on a regular schedule  3 meals per day, snacking between meals should be limited to vegetables or fruits or small portions of nuts. 64 ounces of water per day is generally recommended, unless you have specific health conditions, like heart failure or kidney failure where you will need to limit fluid intake.  3. Commitment to sufficient and a  good quality of physical and mental rest daily, generally between 6 to 8 hours per day.  WITH PERSISTANCE AND PERSEVERANCE, THE IMPOSSIBLE , BECOMES THE NORM!

## 2016-03-25 LAB — URINE CYTOLOGY ANCILLARY ONLY
CHLAMYDIA, DNA PROBE: NEGATIVE
NEISSERIA GONORRHEA: NEGATIVE

## 2016-03-25 NOTE — Assessment & Plan Note (Signed)
Improved Patient re-educated about  the importance of commitment to a  minimum of 150 minutes of exercise per week.  The importance of healthy food choices with portion control discussed. Encouraged to start a food diary, count calories and to consider  joining a support group. Sample diet sheets offered. Goals set by the patient for the next several months.   Weight /BMI 03/24/2016 01/07/2016 12/09/2015  WEIGHT 283 lb 0.6 oz 285 lb 9.6 oz 283 lb  HEIGHT 5\' 8"  5\' 8"  5\' 8"   BMI 43.04 kg/m2 43.43 kg/m2 43.03 kg/m2

## 2016-03-26 ENCOUNTER — Other Ambulatory Visit: Payer: Self-pay | Admitting: Family Medicine

## 2016-03-26 DIAGNOSIS — F418 Other specified anxiety disorders: Secondary | ICD-10-CM

## 2016-03-29 LAB — URINE CYTOLOGY ANCILLARY ONLY
BACTERIAL VAGINITIS: NEGATIVE
CANDIDA VAGINITIS: NEGATIVE

## 2016-05-04 ENCOUNTER — Other Ambulatory Visit: Payer: Self-pay | Admitting: Family Medicine

## 2016-05-04 DIAGNOSIS — E038 Other specified hypothyroidism: Secondary | ICD-10-CM

## 2016-06-04 ENCOUNTER — Other Ambulatory Visit: Payer: Self-pay | Admitting: Family Medicine

## 2016-06-04 DIAGNOSIS — I10 Essential (primary) hypertension: Secondary | ICD-10-CM

## 2016-06-10 ENCOUNTER — Encounter: Payer: Self-pay | Admitting: Cardiovascular Disease

## 2016-06-10 ENCOUNTER — Ambulatory Visit (INDEPENDENT_AMBULATORY_CARE_PROVIDER_SITE_OTHER): Payer: Medicare Other | Admitting: Cardiovascular Disease

## 2016-06-10 VITALS — BP 102/78 | HR 95 | Ht 68.0 in | Wt 277.0 lb

## 2016-06-10 DIAGNOSIS — I10 Essential (primary) hypertension: Secondary | ICD-10-CM

## 2016-06-10 DIAGNOSIS — E669 Obesity, unspecified: Secondary | ICD-10-CM | POA: Diagnosis not present

## 2016-06-10 DIAGNOSIS — G478 Other sleep disorders: Secondary | ICD-10-CM | POA: Diagnosis not present

## 2016-06-10 DIAGNOSIS — R351 Nocturia: Secondary | ICD-10-CM

## 2016-06-10 DIAGNOSIS — R0789 Other chest pain: Secondary | ICD-10-CM | POA: Diagnosis not present

## 2016-06-10 DIAGNOSIS — E1169 Type 2 diabetes mellitus with other specified complication: Secondary | ICD-10-CM

## 2016-06-10 DIAGNOSIS — R42 Dizziness and giddiness: Secondary | ICD-10-CM | POA: Diagnosis not present

## 2016-06-10 MED ORDER — BENAZEPRIL HCL 5 MG PO TABS: 5.0000 mg | ORAL_TABLET | Freq: Every day | ORAL | 1 refills | Status: DC

## 2016-06-10 NOTE — Patient Instructions (Signed)
Your physician has recommended that you have a sleep study. This test records several body functions during sleep, including: brain activity, eye movement, oxygen and carbon dioxide blood levels, heart rate and rhythm, breathing rate and rhythm, the flow of air through your mouth and nose, snoring, body muscle movements, and chest and belly movement.  Your physician has recommended you make the following change in your medication:   1.) the Benazepril has been changed to 5 mg daily. ( 1 tablet)  Your physician wants you to follow-up in: 6 months or sooner if sleep study positive for sleep apnea. You will receive a reminder letter in the mail two months in advance. If you don't receive a letter, please call our office to schedule the follow-up appointment.

## 2016-06-10 NOTE — Progress Notes (Addendum)
Cardiology Office Note    Date:  06/11/2016   ID:  [name], [name] [birth year], MRN [id number]  PCP:  Fayrene Helper, MD  Cardiologist:  Shelva Majestic, MD   Chief Complaint  Patient presents with  . Follow-up    did not have sleep study insurance did not cover    History of Present Illness:  [name] is a 62 y.o. female who presents for a 6  month follow-up cardiology evaluation.  [name] has a history of hypertension, and type 2 diabetes mellitus in addition to GERD.  She had presented to the office on 11/13/2015 and was seen by Rosaria Ferries, PA-C, as well as myself.  At that time, she was under significant amount of stress secondary to family issues, and also had experienced some chest pain radiating to the right side of her neck which was not exertionally precipitated.  Were not evaluation, she was found to have prominence in the [facility] as above arise bilaterally and a significant family history for CAD and was morbidly obese.  She subsequently underwent an echo Doppler study which was done on 12/03/2015 which showed an ejection fraction at 50-55% with grade 1 diastolic dysfunction.  There was mitral annular calcification with trivial MR.  She had normal pulmonary pressures.  A nuclear perfusion study was low risk and showed normal perfusion and function.  Laboratory had revealed elevation of her lipid studies with a total cholesterol 204, triglycerides 158, and LDL 131.  She was started on atorvastatin 40 mg.  She has tolerated this well.    When I last saw her,, she admitted to very poor sleep.  She has frequent awakenings, her sleep is nonrestorative, she notes daytime fatigue, and she snores.  He denies any exertional precipitation of chest pain.  She admits to occasional panic attacks and her heart rate increases.  I felt she had symptoms highly suggestive of obstructive sleep apnea and recommended that she undergo a sleep study for further evaluation.   Apparently, her insurance company which is [employer] denied her sleep evaluation.  She continues to have nocturia 3 times per night, her sleep is nonrestorative, she snores.  She denies any chest tightness.  She is unaware of palpitations.  At times, she admits to some lightheadedness.  She presents for evaluation.     Past Medical History:  Diagnosis Date  . Arthritis   . Diabetes mellitus without complication ([place]) 9702  . GERD (gastroesophageal reflux disease)   . Hyperlipidemia   . Hypertension 2011  . Morbid obesity ([place])   . Thyroid disease     Past Surgical History:  Procedure Laterality Date  . ABDOMINAL HYSTERECTOMY    . APPENDECTOMY    . CHOLECYSTECTOMY    . TONSILLECTOMY      Current Medications: Outpatient Medications Prior to Visit  Medication Sig Dispense Refill  . aspirin 81 MG tablet Take 81 mg by mouth daily.      Marland Kitchen atorvastatin (LIPITOR) 40 MG tablet Take 1 tablet (40 mg total) by mouth daily. 90 tablet 3  . calcium-vitamin D (OSCAL WITH D) 500-200 MG-UNIT per tablet Take 1 tablet by mouth daily.    . cyclobenzaprine (FLEXERIL) 5 MG tablet TAKE ONE TABLET BY MOUTH AT BEDTIME AS NEEDED. SHOULD LAST SIX MONTHS. 30 tablet 0  . Dexlansoprazole (DEXILANT) 30 MG capsule Take 30 mg by mouth daily.    Marland Kitchen gabapentin (NEURONTIN) 300 MG capsule TAKE ONE CAPSULE BY  MOUTH THREE TIMES DAILY. 270 capsule 1  . levothyroxine (SYNTHROID, LEVOTHROID) 150 MCG tablet TAKE ONE TABLET BY MOUTH DAILY. 90 tablet 1  . metFORMIN (GLUCOPHAGE) 500 MG tablet TAKE ONE TABLET BY MOUTH ONCE DAILY. 90 tablet 1  . oxyCODONE-acetaminophen (ROXICET) 5-325 MG tablet One tablet once weekly, as needed, for incontrolled back pain Thirty tablets to last 4 months 30 tablet 0  . traZODone (DESYREL) 100 MG tablet TAKE ONE TABLET BY MOUTH AT BEDTIME. 90 tablet 1  . benazepril (LOTENSIN) 5 MG tablet TAKE TWO (2) TABLETS BY MOUTH DAILY. 180 tablet 1   Facility-Administered Medications  Prior to Visit  Medication Dose Route Frequency Provider Last Rate Last Dose  . Influenza (>/= 3 years) inactive virus vaccine (FLVIRIN/FLUZONE) injection SUSP 0.5 mL  0.5 mL Intramuscular Once Fayrene Helper, MD         Allergies:   Naproxen sodium   Social History   Social History  . Marital status: Divorced    Spouse name: N/A  . Number of children: N/A  . Years of education: N/A   Social History Main Topics  . Smoking status: Never Smoker  . Smokeless tobacco: Never Used  . Alcohol use No  . Drug use: No  . Sexual activity: Not Asked   Other Topics Concern  . None   Social History Narrative  . None     Family History:  The patient's family history includes Asthma in her mother; Cancer in her maternal grandmother, paternal grandfather, sister, and sister; Diabetes in her father; Heart disease (age of onset: 34) in her mother; Heart disease (age of onset: 63) in her father; Hypertension in her father and mother.   ROS General: Negative; No fevers, chills, or night sweats;  HEENT: Negative; No changes in vision or hearing, sinus congestion, difficulty swallowing Pulmonary: Negative; No cough, wheezing, shortness of breath, hemoptysis Cardiovascular: See history of present illness GI: Negative; No nausea, vomiting, diarrhea, or abdominal pain GU: Negative; No dysuria, hematuria, or difficulty voiding Musculoskeletal: Negative; no myalgias, joint pain, or weakness Hematologic/Oncology: Negative; no easy bruising, bleeding Endocrine: Negative; no heat/cold intolerance; no diabetes Neuro: Negative; no changes in balance, headaches Skin: Negative; No rashes or skin lesions Psychiatric: Negative; No behavioral problems, depression Sleep: Positive for snoring, daytime sleepiness, hypersomnolence; no bruxism, restless legs, hypnogognic hallucinations, no cataplexy Other comprehensive 14 point system review is negative.   PHYSICAL EXAM:   VS:  BP 102/78   Pulse 95   Ht  _0  (1.727 m)   Wt 277 lb (125.6 kg)   BMI 42.12 kg/m     Repeat blood pressure by me was 1.  In 24/74 supine and 100/70 standing.  She admitted to mild lightheadedness.  Wt Readings from Last 3 Encounters:  06/10/16 277 lb (125.6 kg)  03/24/16 283 lb 0.6 oz (128.4 kg)  01/07/16 285 lb 9.6 oz (129.5 kg)       Physical Exam BP 102/78   Pulse 95   Ht _1  (1.727 m)   Wt 277 lb (125.6 kg)   BMI 42.12 kg/m  General: Alert, oriented, no distress.  Skin: normal turgor, no rashes, warm and dry HEENT: Normocephalic, atraumatic. Pupils equal round and reactive to light; sclera anicteric; extraocular muscles intact; Fundi .  No hemorrhages or exudates.  Disc flat.  She has bilateral exam Adrian Prince was above her eyelids. Nose without nasal septal hypertrophy Mouth/Parynx benign; Mallinpatti scale 3 Neck: No JVD, no carotid bruits; normal carotid upstroke Lungs:  clear to ausculatation and percussion; no wheezing or rales Chest wall: without tenderness to palpitation Heart: PMI not displaced, RRR, s1 s2 normal, 1/6 systolic murmur, no diastolic murmur, no rubs, gallops, thrills, or heaves Abdomen: moderate central adiposity;soft, nontender; no hepatosplenomehaly, BS+; abdominal aorta nontender and not dilated by palpation. Back: no CVA tenderness Pulses 2+ Musculoskeletal: full range of motion, normal strength, no joint deformities Extremities: no clubbing cyanosis or edema, Homan's sign negative  Neurologic: grossly nonfocal; Cranial nerves grossly wnl Psychologic: Normal mood and affect and cognitive function  Studies/Labs Reviewed:    ECG (independently read by me): normal sinus rhythm at 95 bpm.  Normal intervals.  No ST segment changes.  December 2017 ECG (independently read by me): Normal sinus rhythm at 88 bpm.  Normal intervals.  No ST segment changes.  Recent Labs: BMP Latest Ref Rng & Units 03/19/2016 01/13/2016 11/06/2015  Glucose 65 - 99 mg/dL 127(H) 135(H) 112(H)  BUN  7 - 25 mg/dL _0 Creatinine 0.50 - 0.99 mg/dL 1.09(H) 0.95 1.02(H)  Sodium 135 - 146 mmol/L 140 139 140  Potassium 3.5 - 5.3 mmol/L 5.1 4.3 4.5  Chloride 98 - 110 mmol/L 102 103 104  CO2 20 - 31 mmol/L _1 Calcium 8.6 - 10.4 mg/dL 9.5 9.0 9.3    Hepatic Function Latest Ref Rng & Units 03/19/2016 01/13/2016 11/06/2015  Total Protein 6.1 - 8.1 g/dL 6.6 6.7 6.2  Albumin 3.6 - 5.1 g/dL 3.9 3.9 3.7  AST 10 - 35 U/L _2 ALT 6 - 29 U/L _3 Alk Phosphatase 33 - 130 U/L 107 90 79  Total Bilirubin 0.2 - 1.2 mg/dL 0.6 0.6 0.4  Bilirubin, Direct 0.0 - 0.3 mg/dL - - -    CBC Latest Ref Rng & Units 01/13/2016 07/02/2013 01/15/2010  WBC 3.8 - 10.8 K/uL 10.5 8.4 8.8  Hemoglobin 11.7 - 15.5 g/dL 13.7 13.7 13.9  Hematocrit 35.0 - 45.0 % 41.4 40.1 43.2  Platelets 140 - 400 K/uL 414(H) 395 396   Lab Results  Component Value Date   MCV 91.6 01/13/2016   MCV 88.3 07/02/2013   MCV 93.9 01/15/2010   Lab Results  Component Value Date   TSH 0.45 03/19/2016   Lab Results  Component Value Date   HGBA1C 6.4 (H) 03/19/2016    BNP No results found for: BNP  ProBNP No results found for: PROBNP   Lipid Panel     Component Value Date/Time   CHOL 141 03/19/2016 0933   TRIG 112 03/19/2016 0933   HDL 49 (L) 03/19/2016 0933   CHOLHDL 2.9 03/19/2016 0933   VLDL 22 03/19/2016 0933   LDLCALC 70 03/19/2016 0933     RADIOLOGY: No results found.   Additional studies/ records that were reviewed today include:  I reviewed her prior evaluation.  I reviewed her echo Doppler study, nuclear perfusion study, and prior laboratory.    ASSESSMENT:    1. Non-restorative sleep   2. Nocturia   3. Hypertension goal BP (blood pressure) < 130/80   4. Morbid obesity ([place])   5. Diabetes mellitus type 2 in obese ([place])   6. Atypical chest pain   7. Orthostatic dizziness      PLAN:  [name] is a 61 year old female who has a history of morbid obesity, type 2 diabetes  mellitus, hypertension, hyperlipidemia, hypothyroidism, and family history for coronary artery disease.  She had  developed somewhat atypical chest pain.  Her nuclear perfusion study  shows normal perfusion and function.  Her echo Doppler study confirms normal systolic function with grade 1 diastolic dysfunction.  When she was previously seen, she was started on lipid-lowering therapy with cholesterol elevation and xanthelasmas.  Target LDL is less than 70 in this diabetic female. She is now on atorvastatin 40 mg daily.her most recent laboratory has revealed LDL cholesterol of 70. She has been on benazepril 10 mg.  On exam today she is mildly orthostatic with a systolic blood pressure 116 supine, which dropped to 100 standing.  I will further reduce benazapril to  5 mg. She is on levothyroxine 150 g for hypothyroidism.  She currently is taking metformin 500 mg twice a day for type 2 diabetes mellitus.  She has GERD which is controlled with excellent.  She also has a peripheral neuropathy, currently on gabapentin.  Her sleep review of systems is highly suggestive of obstructive sleep apnea by virtue of poor sleep, frequent awakenings, daytime sleepiness, and snoring. Apparently, she was denied a sleep study, but I suspect this was an in lab denial.  We will try to arrange for her to have a home sleep study for initial evaluation and if  abnormal CPAP titration will be necessary.  I again discussed the importance of exercising and weight loss and  discussed exercising at least 5 days per week for a minimum of 30 minutes.  I will see her in 6 months for follow-up evaluation.   Medication Adjustments/Labs and Tests Ordered: Current medicines are reviewed at length with the patient today.  Concerns regarding medicines are outlined above.  Medication changes, Labs and Tests ordered today are listed in the Patient Instructions below. Patient Instructions  Your physician has recommended that you have a sleep study.  This test records several body functions during sleep, including: brain activity, eye movement, oxygen and carbon dioxide blood levels, heart rate and rhythm, breathing rate and rhythm, the flow of air through your mouth and nose, snoring, body muscle movements, and chest and belly movement.  Your physician has recommended you make the following change in your medication:   1.) the Benazepril has been changed to 5 mg daily. ( 1 tablet)  Your physician wants you to follow-up in: 6 months or sooner if sleep study positive for sleep apnea. You will receive a reminder letter in the mail two months in advance. If you don't receive a letter, please call our office to schedule the follow-up appointment.      Signed, Shelva Majestic, MD  06/11/2016 6:19 PM    [facility] [address], [place], [place], [place]  [address] Phone: [phone]

## 2016-06-14 ENCOUNTER — Other Ambulatory Visit: Payer: Self-pay | Admitting: Family Medicine

## 2016-06-14 DIAGNOSIS — Z1231 Encounter for screening mammogram for malignant neoplasm of breast: Secondary | ICD-10-CM

## 2016-06-17 ENCOUNTER — Ambulatory Visit (HOSPITAL_COMMUNITY)
Admission: RE | Admit: 2016-06-17 | Discharge: 2016-06-17 | Disposition: A | Discharge status: Home / Self Care | Payer: Medicare Other | Source: Ambulatory Visit | Attending: Family Medicine | Admitting: Family Medicine

## 2016-06-17 DIAGNOSIS — Z1231 Encounter for screening mammogram for malignant neoplasm of breast: Secondary | ICD-10-CM | POA: Insufficient documentation

## 2016-06-30 ENCOUNTER — Other Ambulatory Visit: Payer: Self-pay | Admitting: Physician Assistant

## 2016-06-30 ENCOUNTER — Other Ambulatory Visit: Payer: Self-pay | Admitting: Family Medicine

## 2016-06-30 DIAGNOSIS — IMO0001 Reserved for inherently not codable concepts without codable children: Secondary | ICD-10-CM

## 2016-07-14 DIAGNOSIS — E669 Obesity, unspecified: Secondary | ICD-10-CM | POA: Diagnosis not present

## 2016-07-14 DIAGNOSIS — E039 Hypothyroidism, unspecified: Secondary | ICD-10-CM | POA: Diagnosis not present

## 2016-07-14 DIAGNOSIS — I10 Essential (primary) hypertension: Secondary | ICD-10-CM | POA: Diagnosis not present

## 2016-07-14 DIAGNOSIS — E1169 Type 2 diabetes mellitus with other specified complication: Secondary | ICD-10-CM | POA: Diagnosis not present

## 2016-07-15 LAB — BASIC METABOLIC PANEL WITH GFR
BUN: 13 mg/dL (ref 7–25)
CHLORIDE: 104 mmol/L (ref 98–110)
CO2: 25 mmol/L (ref 20–31)
Calcium: 9.2 mg/dL (ref 8.6–10.4)
Creat: 0.92 mg/dL (ref 0.50–0.99)
GFR, Est African American: 78 mL/min (ref >=60)
GFR, Est Non African American: 68 mL/min (ref >=60)
GLUCOSE: 114 mg/dL — AB (ref 65–99)
POTASSIUM: 4.8 mmol/L (ref 3.5–5.3)
Sodium: 140 mmol/L (ref 135–146)

## 2016-07-15 LAB — TSH: TSH: 0.3 mIU/L — ABNORMAL LOW

## 2016-07-15 LAB — HEMOGLOBIN A1C
Hgb A1c MFr Bld: 6.8 % — ABNORMAL HIGH (ref <5.7)
Mean Plasma Glucose: 148 mg/dL

## 2016-07-20 ENCOUNTER — Ambulatory Visit: Payer: Medicare Other | Admitting: Family Medicine

## 2016-07-20 ENCOUNTER — Encounter: Payer: Self-pay | Admitting: Family Medicine

## 2016-07-20 ENCOUNTER — Ambulatory Visit (INDEPENDENT_AMBULATORY_CARE_PROVIDER_SITE_OTHER): Payer: Medicare Other | Admitting: Family Medicine

## 2016-07-20 VITALS — BP 114/74 | HR 80 | Resp 16 | Ht 68.0 in | Wt 280.0 lb

## 2016-07-20 DIAGNOSIS — E8881 Metabolic syndrome: Secondary | ICD-10-CM | POA: Diagnosis not present

## 2016-07-20 DIAGNOSIS — R0683 Snoring: Secondary | ICD-10-CM | POA: Diagnosis not present

## 2016-07-20 DIAGNOSIS — E669 Obesity, unspecified: Secondary | ICD-10-CM | POA: Diagnosis not present

## 2016-07-20 DIAGNOSIS — I10 Essential (primary) hypertension: Secondary | ICD-10-CM

## 2016-07-20 DIAGNOSIS — E785 Hyperlipidemia, unspecified: Secondary | ICD-10-CM

## 2016-07-20 DIAGNOSIS — K219 Gastro-esophageal reflux disease without esophagitis: Secondary | ICD-10-CM

## 2016-07-20 DIAGNOSIS — IMO0001 Reserved for inherently not codable concepts without codable children: Secondary | ICD-10-CM

## 2016-07-20 DIAGNOSIS — E038 Other specified hypothyroidism: Secondary | ICD-10-CM

## 2016-07-20 DIAGNOSIS — E1169 Type 2 diabetes mellitus with other specified complication: Secondary | ICD-10-CM

## 2016-07-20 DIAGNOSIS — M5416 Radiculopathy, lumbar region: Secondary | ICD-10-CM | POA: Diagnosis not present

## 2016-07-20 MED ORDER — METFORMIN HCL 500 MG PO TABS: 500.0000 mg | ORAL_TABLET | Freq: Two times a day (BID) | ORAL | 3 refills | Status: DC

## 2016-07-20 MED ORDER — OXYCODONE-ACETAMINOPHEN 5-325 MG PO TABS
ORAL_TABLET | ORAL | 0 refills | Status: DC
Start: 2016-07-20 — End: 2016-11-24

## 2016-07-20 NOTE — Patient Instructions (Addendum)
Physical exam end October, call if you need me before  Reduce synthroid dose by taking half tablet   Every Wednesday, continue one tablet on all the other days  Commit to 30 minutes exercise 5 days per week  Please get sleep study  You are referred for eye exam and for sleep  Evaluation  Call [employer] for intensive lifestyle coaching availability even possibly help with weight watchers/ tele support for weight loss and exercise program  Fasting lipid, cmp and EGFR, hBA1C, TSH and Vit D 4 days prior to next visit  Start OTC vit D3 2000 IU once daily  Increase metformin to one twice daily  PLEASE stop the ice cream and sweets, let us ENJOY fruits!  Weight loss goal of 8 to 10 pounds

## 2016-07-20 NOTE — Assessment & Plan Note (Signed)
Deteriorated inc metformin to bid [name] is reminded of the importance of commitment to daily physical activity for 30 minutes or more, as able and the need to limit carbohydrate intake to 30 to 60 grams per meal to help with blood sugar control.   The need to take medication as prescribed, test blood sugar as directed, and to call between visits if there is a concern that blood sugar is uncontrolled is also discussed.   [name] is reminded of the importance of daily foot exam, annual eye examination, and good blood sugar, blood pressure and cholesterol control.  Diabetic Labs Latest Ref Rng & Units 07/14/2016 03/19/2016 01/13/2016 11/06/2015 06/04/2015  HbA1c <5.7 % 6.8(H) 6.4(H) 6.7(H) 6.4(H) -  Microalbumin Not estab mg/dL - - - - <0.2  Micro/Creat Ratio <30 mcg/mg creat - - - - SEE NOTE  Chol <200 mg/dL - 141 143 204(H) -  HDL >50 mg/dL - 49(L) 41(L) 41(L) -  Calc LDL <100 mg/dL - 70 67 131(H) -  Triglycerides <150 mg/dL - 112 175(H) 158(H) -  Creatinine 0.50 - 0.99 mg/dL 0.92 1.09(H) 0.95 1.02(H) -   BP/Weight 07/20/2016 06/10/2016 03/24/2016 01/07/2016 12/09/2015 11/27/2015 73/42/8768  Systolic BP 115 726 203 559 741 - 638  Diastolic BP 74 78 74 80 82 - 72  Wt. (Lbs) 280 277 283.04 285.6 283 279 279.4  BMI 42.57 42.12 43.04 43.43 43.03 42.42 42.48   Foot/eye exam completion dates Latest Ref Rng & Units 11/10/2015 04/30/2015  Eye Exam No Retinopathy - -  Foot Form Completion - Done Done   Refer for eye exam

## 2016-07-20 NOTE — Assessment & Plan Note (Signed)
Excessive datytime sleepiness refer for sleep study

## 2016-07-20 NOTE — Assessment & Plan Note (Signed)
Deteriorated. Patient re-educated about  the importance of commitment to a  minimum of 150 minutes of exercise per week.  The importance of healthy food choices with portion control discussed. Encouraged to start a food diary, count calories and to consider  joining a support group. Sample diet sheets offered. Goals set by the patient for the next several months.   Weight /BMI 07/20/2016 06/10/2016 03/24/2016  WEIGHT 280 lb 277 lb 283 lb 0.6 oz  HEIGHT 5\' 8"  5\' 8"  5\' 8"   BMI 42.57 kg/m2 42.12 kg/m2 43.04 kg/m2

## 2016-07-20 NOTE — Assessment & Plan Note (Signed)
Overcorrected, take half synthroid q wednesday

## 2016-08-03 ENCOUNTER — Encounter: Payer: Self-pay | Admitting: Family Medicine

## 2016-08-03 NOTE — Assessment & Plan Note (Signed)
Controlled, no change in medication  

## 2016-08-03 NOTE — Assessment & Plan Note (Signed)
Hyperlipidemia:Low fat diet discussed and encouraged.   Lipid Panel  Lab Results  Component Value Date   CHOL 141 03/19/2016   HDL 49 (L) 03/19/2016   LDLCALC 70 03/19/2016   TRIG 112 03/19/2016   CHOLHDL 2.9 03/19/2016

## 2016-08-03 NOTE — Assessment & Plan Note (Signed)
The increased risk of cardiovascular disease associated with this diagnosis, and the need to consistently work on lifestyle to change this is discussed. Following  a  heart healthy diet ,commitment to 30 minutes of exercise at least 5 days per week, as well as control of blood sugar and cholesterol , and achieving a healthy weight are all the areas to be addressed .  

## 2016-08-03 NOTE — Progress Notes (Signed)
[name]     MRN: [id number]      DOB: 1955-01-31   HPI [name] is here for follow up and re-evaluation of chronic medical conditions, medication management and review of any available recent lab and radiology data.  Preventive health is updated, specifically  Cancer screening and Immunization.   Questions or concerns regarding consultations or procedures which the PT has had in the interim are  addressed. The PT denies any adverse reactions to current medications since the last visit.  There are no new concerns.  There are no specific complaints   ROS Denies recent fever or chills. Denies sinus pressure, nasal congestion, ear pain or sore throat. Denies chest congestion, productive cough or wheezing. Denies chest pains, palpitations and leg swelling Denies abdominal pain, nausea, vomiting,diarrhea or constipation.   Denies dysuria, frequency, hesitancy or incontinence. Denies uncontrolled joint pain, swelling and limitation in mobility. Denies headaches, seizures, numbness, or tingling. Denies depression, anxiety or insomnia. Denies skin break down or rash.   PE  BP 114/74   Pulse 80   Resp 16   Ht 5\' 8"  (1.727 m)   Wt 280 lb (127 kg)   SpO2 96%   BMI 42.57 kg/m   Patient alert and oriented and in no cardiopulmonary distress.  HEENT: No facial asymmetry, EOMI,   oropharynx pink and moist.  Neck supple no JVD, no mass.  Chest: Clear to auscultation bilaterally.  CVS: S1, S2 no murmurs, no S3.Regular rate.  ABD: Soft non tender.   Ext: No edema  MS: Adequate though reduced   ROM spine, shoulders, hips and knees.  Skin: Intact, no ulcerations or rash noted.  Psych: Good eye contact, normal affect. Memory intact not anxious or depressed appearing.  CNS: CN 2-12 intact, power,  normal throughout.no focal deficits noted.   Assessment & Plan  Diabetes mellitus type 2 in obese ([facility]) Deteriorated inc metformin to bid [name] is reminded of the  importance of commitment to daily physical activity for 30 minutes or more, as able and the need to limit carbohydrate intake to 30 to 60 grams per meal to help with blood sugar control.   The need to take medication as prescribed, test blood sugar as directed, and to call between visits if there is a concern that blood sugar is uncontrolled is also discussed.   [name] is reminded of the importance of daily foot exam, annual eye examination, and good blood sugar, blood pressure and cholesterol control.  Diabetic Labs Latest Ref Rng & Units 07/14/2016 03/19/2016 01/13/2016 11/06/2015 06/04/2015  HbA1c <5.7 % 6.8(H) 6.4(H) 6.7(H) 6.4(H) -  Microalbumin Not estab mg/dL - - - - <0.2  Micro/Creat Ratio <30 mcg/mg creat - - - - SEE NOTE  Chol <200 mg/dL - 141 143 204(H) -  HDL >50 mg/dL - 49(L) 41(L) 41(L) -  Calc LDL <100 mg/dL - 70 67 131(H) -  Triglycerides <150 mg/dL - 112 175(H) 158(H) -  Creatinine 0.50 - 0.99 mg/dL 0.92 1.09(H) 0.95 1.02(H) -   BP/Weight 07/20/2016 06/10/2016 03/24/2016 01/07/2016 12/09/2015 11/27/2015 75/64/3329  Systolic BP 518 841 660 630 160 - 109  Diastolic BP 74 78 74 80 82 - 72  Wt. (Lbs) 280 277 283.04 285.6 283 279 279.4  BMI 42.57 42.12 43.04 43.43 43.03 42.42 42.48   Foot/eye exam completion dates Latest Ref Rng & Units 11/10/2015 04/30/2015  Eye Exam No Retinopathy - -  Foot Form Completion - Done Done   Refer  for eye exam     Hypothyroidism Overcorrected, take half synthroid q wednesday  Morbid obesity Deteriorated. Patient re-educated about  the importance of commitment to a  minimum of 150 minutes of exercise per week.  The importance of healthy food choices with portion control discussed. Encouraged to start a food diary, count calories and to consider  joining a support group. Sample diet sheets offered. Goals set by the patient for the next several months.   Weight /BMI 07/20/2016 06/10/2016 03/24/2016  WEIGHT 280 lb 277 lb 283 lb 0.6 oz  HEIGHT  5\' 8"  5\' 8"  5\' 8"   BMI 42.57 kg/m2 42.12 kg/m2 43.04 kg/m2      Snoring Excessive datytime sleepiness refer for sleep study  Hypertension goal BP (blood pressure) < 130/80 Controlled, no change in medication DASH diet and commitment to daily physical activity for a minimum of 30 minutes discussed and encouraged, as a part of hypertension management. The importance of attaining a healthy weight is also discussed.  BP/Weight 07/20/2016 06/10/2016 03/24/2016 01/07/2016 12/09/2015 11/27/2015 49/44/9675  Systolic BP 916 384 665 993 570 - 177  Diastolic BP 74 78 74 80 82 - 72  Wt. (Lbs) 280 277 283.04 285.6 283 279 279.4  BMI 42.57 42.12 43.04 43.43 43.03 42.42 42.48       GERD Controlled, no change in medication   Hyperlipidemia Hyperlipidemia:Low fat diet discussed and encouraged.   Lipid Panel  Lab Results  Component Value Date   CHOL 141 03/19/2016   HDL 49 (L) 03/19/2016   LDLCALC 70 03/19/2016   TRIG 112 03/19/2016   CHOLHDL 2.9 03/19/2016       Radicular pain of right lower back Controlled, no change in medication   Metabolic syndrome X The increased risk of cardiovascular disease associated with this diagnosis, and the need to consistently work on lifestyle to change this is discussed. Following  a  heart healthy diet ,commitment to 30 minutes of exercise at least 5 days per week, as well as control of blood sugar and cholesterol , and achieving a healthy weight are all the areas to be addressed .

## 2016-08-03 NOTE — Assessment & Plan Note (Signed)
Controlled, no change in medication DASH diet and commitment to daily physical activity for a minimum of 30 minutes discussed and encouraged, as a part of hypertension management. The importance of attaining a healthy weight is also discussed.  BP/Weight 07/20/2016 06/10/2016 03/24/2016 01/07/2016 12/09/2015 11/27/2015 64/33/2951  Systolic BP 884 166 063 016 010 - 932  Diastolic BP 74 78 74 80 82 - 72  Wt. (Lbs) 280 277 283.04 285.6 283 279 279.4  BMI 42.57 42.12 43.04 43.43 43.03 42.42 42.48

## 2016-08-11 ENCOUNTER — Ambulatory Visit (HOSPITAL_BASED_OUTPATIENT_CLINIC_OR_DEPARTMENT_OTHER): Payer: Medicare Other | Attending: Cardiovascular Disease | Admitting: Cardiovascular Disease

## 2016-08-11 VITALS — Ht 68.0 in | Wt 280.0 lb

## 2016-08-11 DIAGNOSIS — G4761 Periodic limb movement disorder: Secondary | ICD-10-CM | POA: Insufficient documentation

## 2016-08-11 DIAGNOSIS — I10 Essential (primary) hypertension: Secondary | ICD-10-CM | POA: Diagnosis not present

## 2016-08-11 DIAGNOSIS — Z7982 Long term (current) use of aspirin: Secondary | ICD-10-CM | POA: Diagnosis not present

## 2016-08-11 DIAGNOSIS — Z7984 Long term (current) use of oral hypoglycemic drugs: Secondary | ICD-10-CM | POA: Diagnosis not present

## 2016-08-11 DIAGNOSIS — E669 Obesity, unspecified: Secondary | ICD-10-CM | POA: Diagnosis not present

## 2016-08-11 DIAGNOSIS — Z79899 Other long term (current) drug therapy: Secondary | ICD-10-CM | POA: Diagnosis not present

## 2016-08-11 DIAGNOSIS — G478 Other sleep disorders: Secondary | ICD-10-CM | POA: Diagnosis present

## 2016-08-11 DIAGNOSIS — Z6841 Body Mass Index (BMI) 40.0 and over, adult: Secondary | ICD-10-CM | POA: Insufficient documentation

## 2016-08-11 DIAGNOSIS — G4730 Sleep apnea, unspecified: Secondary | ICD-10-CM | POA: Diagnosis not present

## 2016-08-11 DIAGNOSIS — R351 Nocturia: Secondary | ICD-10-CM

## 2016-08-11 DIAGNOSIS — E119 Type 2 diabetes mellitus without complications: Secondary | ICD-10-CM | POA: Insufficient documentation

## 2016-08-18 ENCOUNTER — Telehealth: Payer: Self-pay | Admitting: *Deleted

## 2016-08-18 NOTE — Telephone Encounter (Signed)
I called patient to inform her that I noticed that she has appointment to see Dr Waynette Buttery for a sleep evaluation. I ex[lained to her that since Dr Claiborne Billings is already her Doctor and he ordered and read her sleep study she does not need to have that appointment unless she wants Dr Waynette Buttery to follow her if her sleep study is positive for sleep apnea. Patient decided that she does not want to keep Dr Elenore Paddy appointment. She says that her PCP scheduled the appointment. I informed her that once Dr Claiborne Billings has read her sleep study someone will call her with his results and recommendations.

## 2016-09-15 ENCOUNTER — Telehealth: Payer: Self-pay | Admitting: *Deleted

## 2016-09-15 NOTE — Telephone Encounter (Signed)
Just an FYI regarding referral to [facility]   Per Wilburn Cornelia:: I wanted to let you know that this referral has been closed per the patient's request.  On 08/18/2016, the patient called in and cancelled her appointment scheduled for 10/27/2016.  The patient requested for appointment to be canceled and stated she wants Dr Claiborne Billings to manage. Please call our office if you have any questions, [phone].

## 2016-09-16 NOTE — Procedures (Signed)
Patient Name: [name], [place] Date: 08/11/2016 Gender: Female D.O.B: 1955/11/07 Age (years): 52 Referring Provider: Shelva Majestic MD, ABSM Height (inches): 68 Interpreting Physician: Shelva Majestic MD, ABSM Weight (lbs): 280 RPSGT: Jorge Ny BMI: 65 MRN: [id number] Neck Size: 16.00  CLINICAL INFORMATION Sleep Study Type: NPSG  Indication for sleep study: Diabetes, Hypertension, Obesity, Snoring  Epworth Sleepiness Score: 3  SLEEP STUDY TECHNIQUE As per the AASM Manual for the Scoring of Sleep and Associated Events v2.3 (April 2016) with a hypopnea requiring 4% desaturations.  The channels recorded and monitored were frontal, central and occipital EEG, electrooculogram (EOG), submentalis EMG (chin), nasal and oral airflow, thoracic and abdominal wall motion, anterior tibialis EMG, snore microphone, electrocardiogram, and pulse oximetry.  MEDICATIONS . aspirin 81 MG tablet Take 81 mg by mouth daily.      Marland Kitchen atorvastatin (LIPITOR) 40 MG tablet Take 1 tablet (40 mg total) by mouth daily. 90 tablet 3  . calcium-vitamin D (OSCAL WITH D) 500-200 MG-UNIT per tablet Take 1 tablet by mouth daily.    . cyclobenzaprine (FLEXERIL) 5 MG tablet TAKE ONE TABLET BY MOUTH AT BEDTIME AS NEEDED. SHOULD LAST SIX MONTHS. 30 tablet 0  . Dexlansoprazole (DEXILANT) 30 MG capsule Take 30 mg by mouth daily.    Marland Kitchen gabapentin (NEURONTIN) 300 MG capsule TAKE ONE CAPSULE BY MOUTH THREE TIMES DAILY. 270 capsule 1  . levothyroxine (SYNTHROID, LEVOTHROID) 150 MCG tablet TAKE ONE TABLET BY MOUTH DAILY. 90 tablet 1  . metFORMIN (GLUCOPHAGE) 500 MG tablet TAKE ONE TABLET BY MOUTH ONCE DAILY. 90 tablet 1  . oxyCODONE-acetaminophen (ROXICET) 5-325 MG tablet One tablet once weekly, as needed, for incontrolled back pain Thirty tablets to last 4 months 30 tablet 0  . traZODone (DESYREL) 100 MG tablet TAKE ONE TABLET BY MOUTH AT BEDTIME. 90 tablet 1  . benazepril (LOTENSIN) 5 MG tablet TAKE TWO (2)  TABLETS BY MOUTH DAILY. 180 tablet    Medications self-administered by patient taken the night of the study : N/A  SLEEP ARCHITECTURE The study was initiated at 11:09:02 PM and ended at 5:12:42 AM.  Sleep onset time was 68.2 minutes and the sleep efficiency was 63.4%. The total sleep time was 230.4 minutes.  Stage REM latency was 229.5 minutes.  The patient spent 4.77% of the night in stage N1 sleep, 88.93% in stage N2 sleep, 0.00% in stage N3 and 6.29% in REM.  Alpha intrusion was absent.  Supine sleep was 43.59%.  RESPIRATORY PARAMETERS The overall apnea/hypopnea index (AHI) was 3.6 per hour.  The respiratory disturbance index (RDI) was 15.1 per hour.  There were 0 total apneas, including 0 obstructive, 0 central and 0 mixed apneas. There were 14 hypopneas and 58 RERAs.  The AHI during Stage REM sleep was 0.0 per hour.  AHI while supine was 0.6 per hour.  The mean oxygen saturation was 93.74%. The minimum SpO2 during sleep was 87.00%.  Moderate snoring was noted during this study.  CARDIAC DATA The 2 lead EKG demonstrated sinus rhythm. The mean heart rate was 64.35 beats per minute. Other EKG findings include: Rare isolated PAC.  LEG MOVEMENT DATA The total PLMS were 131 with a resulting PLMS index of 34.11. Associated arousal with leg movement index was 0.8 .  IMPRESSIONS Increased upper airway resistance syndrome (UARS) , with an AHI of 3.6 per hour and increased RDI at 15.1/h. No significant central sleep apnea occurred during this study (CAI = 0.0/h). Mild oxygen desaturation to a nadir of 87%.  Reduced sleep efficiency at only 63.4%. Abnormal sleep architecture with absence of slow-wave sleep, reduction in in REM sleep, and prolonged latency to in REM sleep The patient snored with Moderate snoring volume. No significant cardiac abnormalities were noted during this study. Moderate periodic limb movements of sleep occurred during the study. No significant associated  arousals.  DIAGNOSIS Sleep apnea, unspecified type, G47.30 Periodic limb movement of sleep  RECOMMENDATIONS At present, patient does not meet criteria for CPAP therapy. Efforts should be made to optimize nasal and oral pharyngeal patency. Consider alternatives for the treatment of moderate snoring. If patient is symptomatic with restless legs, consider pharmacotherapy with a PLMS index of 34.11 Avoid alcohol, sedatives and other CNS depressants that may worsen sleep apnea and disrupt normal sleep architecture. Sleep hygiene should be reviewed to assess factors that may improve sleep quality. Weight management and regular exercise should be initiated or continued if appropriate.  [Electronically signed] 09/16/2016 10:50 AM  Shelva Majestic MD, [facility], ABSM Diplomate, American Board of Sleep Medicine NPI: [id number]  [place] PH: [phone]   FX: [phone] [place]

## 2016-09-22 ENCOUNTER — Telehealth: Payer: Self-pay | Admitting: *Deleted

## 2016-09-22 NOTE — Telephone Encounter (Signed)
Patient aware and verbalized understanding. °

## 2016-09-22 NOTE — Telephone Encounter (Signed)
-----   Message from Troy Sine, MD sent at 09/16/2016 11:02 AM EDT ----- Please notify patient results of her sleep study.  She has increased upper airway resistance, and does not meet criteria for CPAP.  Consider alternatives to snoring.  Can discuss with patient at follow-up office visit with me.

## 2016-10-27 ENCOUNTER — Institutional Professional Consult (permissible substitution): Payer: Medicare Other | Admitting: Pulmonary Disease

## 2016-11-08 ENCOUNTER — Other Ambulatory Visit: Payer: Self-pay | Admitting: Family Medicine

## 2016-11-08 DIAGNOSIS — F418 Other specified anxiety disorders: Secondary | ICD-10-CM

## 2016-11-08 DIAGNOSIS — E038 Other specified hypothyroidism: Secondary | ICD-10-CM

## 2016-11-08 NOTE — Telephone Encounter (Signed)
Seen [phone] 

## 2016-11-11 ENCOUNTER — Encounter: Payer: Self-pay | Admitting: Family Medicine

## 2016-11-11 ENCOUNTER — Ambulatory Visit (INDEPENDENT_AMBULATORY_CARE_PROVIDER_SITE_OTHER): Payer: Medicare Other | Admitting: Family Medicine

## 2016-11-11 VITALS — BP 110/80 | HR 102 | Temp 98.9°F | Resp 16 | Ht 68.0 in | Wt 272.0 lb

## 2016-11-11 DIAGNOSIS — I10 Essential (primary) hypertension: Secondary | ICD-10-CM

## 2016-11-11 DIAGNOSIS — M5416 Radiculopathy, lumbar region: Secondary | ICD-10-CM | POA: Diagnosis not present

## 2016-11-11 DIAGNOSIS — IMO0001 Reserved for inherently not codable concepts without codable children: Secondary | ICD-10-CM

## 2016-11-11 MED ORDER — METHYLPREDNISOLONE ACETATE 80 MG/ML IJ SUSP
80.0000 mg | Freq: Once | INTRAMUSCULAR | Status: AC
  Administered 2016-11-11: 80 mg via INTRAMUSCULAR

## 2016-11-11 MED ORDER — PREDNISONE 5 MG (21) PO TBPK: 5.0000 mg | ORAL_TABLET | ORAL | 0 refills | Status: DC

## 2016-11-11 MED ORDER — KETOROLAC TROMETHAMINE 60 MG/2ML IM SOLN
60.0000 mg | Freq: Once | INTRAMUSCULAR | Status: AC
  Administered 2016-11-11: 60 mg via INTRAMUSCULAR

## 2016-11-11 MED ORDER — SULFAMETHOXAZOLE-TRIMETHOPRIM 800-160 MG PO TABS: 1.0000 | ORAL_TABLET | Freq: Two times a day (BID) | ORAL | 0 refills | Status: DC

## 2016-11-11 MED ORDER — IBUPROFEN 800 MG PO TABS: 800.0000 mg | ORAL_TABLET | Freq: Three times a day (TID) | ORAL | 0 refills | Status: DC | PRN

## 2016-11-11 NOTE — Patient Instructions (Addendum)
F/u as before , call if you need me sooner.  Toradol and depo medrol in office today and ibuprofen ad prednisone sent to [facility] as discussed  Thank you  for choosing [facility]. We consider it a privelige to serve you.  Delivering excellent health care in a caring and  compassionate way is our goal.  Partnering with you,  so that together we can achieve this goal is our strategy.  You are treated for right earinfection

## 2016-11-11 NOTE — Assessment & Plan Note (Signed)
Uncontrolled.Toradol and depo medrol administered IM in the office , to be followed by a short course of oral prednisone and NSAIDS. 5 day history

## 2016-11-12 ENCOUNTER — Encounter: Payer: Self-pay | Admitting: Family Medicine

## 2016-11-12 NOTE — Progress Notes (Signed)
   [name]     MRN: [id number]      DOB: 05-Oct-1955   HPI [name] is here with a 5 day h/o acute right lower extremity pain radiating from her low back, she has established sciatica, no known trigger for current symptoms. No new weakness, numbness or incontinence. Has occurred in the past ROS Denies recent fever or chills. Denies sinus pressure, nasal congestion, ear pain or sore throat. Denies chest congestion, productive cough or wheezing. Denies dysuria, frequency, hesitancy or incontinence. Denies joint pain, swelling and limitation in mobility. Denies skin break down or rash.   PE  BP 110/80 (BP Location: Right Arm, Patient Position: Sitting, Cuff Size: Normal)   Pulse (!) 102   Temp 98.9 F (37.2 C)   Resp 16   Ht 5\' 8"  (1.727 m)   Wt 272 lb (123.4 kg)   SpO2 99%   BMI 41.36 kg/m   Patient alert and oriented and in no cardiopulmonary distress. Pt is in pain  HEENT: No facial asymmetry, EOMI,   oropharynx pink and moist.  Neck supple no JVD, no mass.  Chest: Clear to auscultation bilaterally.  CVS: S1, S2 no murmurs, no S3.Regular rate.  ABD: Soft non tender.   Ext: No edema  MS: Decreased  ROM lumbar  Spine and  right lower extremity Skin: Intact, no ulcerations or rash noted.  Psych: Good eye contact, normal affect. Memory intact not anxious or depressed appearing.  CNS: CN 2-12 intact,  Assessment & Plan  Radicular pain of right lower back Uncontrolled.Toradol and depo medrol administered IM in the office , to be followed by a short course of oral prednisone and NSAIDS. 5 day history  Hypertension goal BP (blood pressure) < 130/80 Controlled, no change in medication DASH diet and commitment to daily physical activity for a minimum of 30 minutes discussed and encouraged, as a part of hypertension management. The importance of attaining a healthy weight is also discussed.  BP/Weight 11/11/2016 08/11/2016 07/20/2016 06/10/2016 03/24/2016 01/07/2016  48/54/6270  Systolic BP 350 - 093 818 299 371 696  Diastolic BP 80 - 74 78 74 80 82  Wt. (Lbs) 272 280 280 277 283.04 285.6 283  BMI 41.36 42.57 42.57 42.12 43.04 43.43 43.03

## 2016-11-12 NOTE — Assessment & Plan Note (Signed)
Controlled, no change in medication DASH diet and commitment to daily physical activity for a minimum of 30 minutes discussed and encouraged, as a part of hypertension management. The importance of attaining a healthy weight is also discussed.  BP/Weight 11/11/2016 08/11/2016 07/20/2016 06/10/2016 03/24/2016 01/07/2016 12/87/8676  Systolic BP 720 - 947 096 283 662 947  Diastolic BP 80 - 74 78 74 80 82  Wt. (Lbs) 272 280 280 277 283.04 285.6 283  BMI 41.36 42.57 42.57 42.12 43.04 43.43 43.03

## 2016-11-22 DIAGNOSIS — E1169 Type 2 diabetes mellitus with other specified complication: Secondary | ICD-10-CM | POA: Diagnosis not present

## 2016-11-22 DIAGNOSIS — E669 Obesity, unspecified: Secondary | ICD-10-CM | POA: Diagnosis not present

## 2016-11-22 DIAGNOSIS — E785 Hyperlipidemia, unspecified: Secondary | ICD-10-CM | POA: Diagnosis not present

## 2016-11-22 DIAGNOSIS — I10 Essential (primary) hypertension: Secondary | ICD-10-CM | POA: Diagnosis not present

## 2016-11-23 LAB — COMPLETE METABOLIC PANEL WITH GFR
AG Ratio: 1.4 (calc) (ref 1.0–2.5)
ALBUMIN MSPROF: 3.9 g/dL (ref 3.6–5.1)
ALKALINE PHOSPHATASE (APISO): 115 U/L (ref 33–130)
ALT: 33 U/L — ABNORMAL HIGH (ref 6–29)
AST: 27 U/L (ref 10–35)
BUN: 17 mg/dL (ref 7–25)
CO2: 30 mmol/L (ref 20–32)
CREATININE: 0.92 mg/dL (ref 0.50–0.99)
Calcium: 9.6 mg/dL (ref 8.6–10.4)
Chloride: 103 mmol/L (ref 98–110)
GFR, Est African American: 78 mL/min/{1.73_m2} (ref >=60)
GFR, Est Non African American: 67 mL/min/{1.73_m2} (ref >=60)
GLUCOSE: 107 mg/dL — AB (ref 65–99)
Globulin: 2.7 g/dL (calc) (ref 1.9–3.7)
Potassium: 4.6 mmol/L (ref 3.5–5.3)
SODIUM: 139 mmol/L (ref 135–146)
Total Bilirubin: 0.9 mg/dL (ref 0.2–1.2)
Total Protein: 6.6 g/dL (ref 6.1–8.1)

## 2016-11-23 LAB — HEMOGLOBIN A1C
Hgb A1c MFr Bld: 6.4 % of total Hgb — ABNORMAL HIGH (ref <5.7)
Mean Plasma Glucose: 137 (calc)
eAG (mmol/L): 7.6 (calc)

## 2016-11-23 LAB — LIPID PANEL
Cholesterol: 149 mg/dL (ref <200)
HDL: 64 mg/dL (ref >50)
LDL CHOLESTEROL (CALC): 69 mg/dL
Non-HDL Cholesterol (Calc): 85 mg/dL (calc) (ref <130)
Total CHOL/HDL Ratio: 2.3 (calc) (ref <5.0)
Triglycerides: 81 mg/dL (ref <150)

## 2016-11-23 LAB — VITAMIN D 25 HYDROXY (VIT D DEFICIENCY, FRACTURES): VIT D 25 HYDROXY: 33 ng/mL (ref 30–100)

## 2016-11-23 LAB — TSH: TSH: 0.52 mIU/L (ref 0.40–4.50)

## 2016-11-24 ENCOUNTER — Ambulatory Visit (INDEPENDENT_AMBULATORY_CARE_PROVIDER_SITE_OTHER): Payer: Medicare Other | Admitting: Family Medicine

## 2016-11-24 ENCOUNTER — Encounter: Payer: Self-pay | Admitting: Family Medicine

## 2016-11-24 VITALS — BP 118/70 | HR 90 | Resp 16 | Ht 68.0 in | Wt 272.0 lb

## 2016-11-24 DIAGNOSIS — IMO0001 Reserved for inherently not codable concepts without codable children: Secondary | ICD-10-CM

## 2016-11-24 DIAGNOSIS — G894 Chronic pain syndrome: Secondary | ICD-10-CM

## 2016-11-24 DIAGNOSIS — E669 Obesity, unspecified: Secondary | ICD-10-CM

## 2016-11-24 DIAGNOSIS — E1169 Type 2 diabetes mellitus with other specified complication: Secondary | ICD-10-CM | POA: Diagnosis not present

## 2016-11-24 DIAGNOSIS — M5416 Radiculopathy, lumbar region: Secondary | ICD-10-CM | POA: Diagnosis not present

## 2016-11-24 DIAGNOSIS — E038 Other specified hypothyroidism: Secondary | ICD-10-CM

## 2016-11-24 DIAGNOSIS — Z0001 Encounter for general adult medical examination with abnormal findings: Secondary | ICD-10-CM | POA: Diagnosis not present

## 2016-11-24 DIAGNOSIS — Z23 Encounter for immunization: Secondary | ICD-10-CM

## 2016-11-24 DIAGNOSIS — Z0000 Encounter for general adult medical examination without abnormal findings: Secondary | ICD-10-CM

## 2016-11-24 DIAGNOSIS — G8929 Other chronic pain: Secondary | ICD-10-CM | POA: Insufficient documentation

## 2016-11-24 MED ORDER — OXYCODONE-ACETAMINOPHEN 5-325 MG PO TABS: ORAL_TABLET | ORAL | 0 refills | Status: DC

## 2016-11-24 MED ORDER — LEVOTHYROXINE SODIUM 150 MCG PO TABS
150.0000 ug | ORAL_TABLET | Freq: Every day | ORAL | 1 refills | Status: DC
Start: 2016-11-24 — End: 2017-04-25

## 2016-11-24 MED ORDER — IBUPROFEN 800 MG PO TABS: 800.0000 mg | ORAL_TABLET | Freq: Three times a day (TID) | ORAL | 0 refills | Status: DC | PRN

## 2016-11-24 NOTE — Progress Notes (Signed)
    [name]     MRN: [id number]      DOB: 30-Oct-1955  HPI: Patient is in for annual physical exam. No other health concerns are expressed or addressed at the visit. Recent labs, are reviewed. Immunization is reviewed , and  updated  Chronic pain needs are reviewed and addressed due to scitica   PE: BP 118/70   Pulse 90   Resp 16   Ht 5\' 8"  (1.727 m)   Wt 272 lb (123.4 kg)   SpO2 97%   BMI 41.36 kg/m   Pleasant  female, alert and oriented x 3, in no cardio-pulmonary distress. Afebrile. HEENT No facial trauma or asymetry. Sinuses non tender.  Extra occullar muscles intact, pupils equally reactive to light. External ears normal, tympanic membranes clear. Oropharynx moist, no exudate. Neck: supple, no adenopathy,JVD or thyromegaly.No bruits.  Chest: Clear to ascultation bilaterally.No crackles or wheezes. Non tender to palpation  Breast: No asymetry,no masses or lumps. No tenderness. No nipple discharge or inversion. No axillary or supraclavicular adenopathy  Cardiovascular system; Heart sounds normal,  S1 and  S2 ,no S3.  No murmur, or thrill. Apical beat not displaced Peripheral pulses normal.  Abdomen: Soft, non tender, no organomegaly or masses. No bruits. Bowel sounds normal. No guarding, tenderness or rebound.  Rectal:  Normal sphincter tone. No rectal mass. Guaiac negative stool.  GU: Not examined   Musculoskeletal exam: Decreased  ROM of spine, hips , shoulders and knees. No deformity ,swelling or crepitus noted. No muscle wasting or atrophy.   Neurologic: Cranial nerves 2 to 12 intact. Power, tone ,normal throughout, decreased sensation in LLE No disturbance in gait. No tremor.  Skin: Intact, no ulceration, erythema , scaling or rash noted. Pigmentation normal throughout  Psych; Normal mood and affect. Judgement and concentration normal   Assessment & Plan:  Annual physical exam Annual exam as documented. Counseling done  re  healthy lifestyle involving commitment to 150 minutes exercise per week, heart healthy diet, and attaining healthy weight.The importance of adequate sleep also discussed. Regular seat belt use and home safety, is also discussed. Changes in health habits are decided on by the patient with goals and time frames  set for achieving them. Immunization and cancer screening needs are specifically addressed at this visit.   Chronic pain The patient's Controlled Substance registry is reviewed and compliance confirmed. Adequacy of  Pain control and level of function is assessed. Medication dosing is adjusted as deemed appropriate. Twelve weeks of medication is prescribed , patient signs for the script and is provided with a follow up appointment between 11 to 12 weeks .

## 2016-11-24 NOTE — Patient Instructions (Addendum)
F/u in 4.5  months, call if you need me sooner  Microalb today from office   Labs are good.  No med changes    Need eye exam  Flu vaccine today    Fasting lipid, cmp and EGFr, hBa1C,  TSH, CBC 1 week before next visit  Thank you  for choosing [facility]. We consider it a privelige to serve you.  Delivering excellent health care in a caring and  compassionate way is our goal.  Partnering with you,  so that together we can achieve this goal is our strategy.

## 2016-11-24 NOTE — Assessment & Plan Note (Signed)

## 2016-11-24 NOTE — Assessment & Plan Note (Signed)
The patient's Controlled Substance registry is reviewed and compliance confirmed. Adequacy of  Pain control and level of function is assessed. Medication dosing is adjusted as deemed appropriate. Twelve weeks of medication is prescribed , patient signs for the script and is provided with a follow up appointment between 11 to 12 weeks .  

## 2016-11-24 NOTE — Assessment & Plan Note (Signed)
Controlled, no change in medication [name] is reminded of the importance of commitment to daily physical activity for 30 minutes or more, as able and the need to limit carbohydrate intake to 30 to 60 grams per meal to help with blood sugar control.   The need to take medication as prescribed, test blood sugar as directed, and to call between visits if there is a concern that blood sugar is uncontrolled is also discussed.   [name] is reminded of the importance of daily foot exam, annual eye examination, and good blood sugar, blood pressure and cholesterol control.  Diabetic Labs Latest Ref Rng & Units 11/22/2016 07/14/2016 03/19/2016 01/13/2016 11/06/2015  HbA1c <5.7 % of total Hgb 6.4(H) 6.8(H) 6.4(H) 6.7(H) 6.4(H)  Microalbumin Not estab mg/dL - - - - -  Micro/Creat Ratio <30 mcg/mg creat - - - - -  Chol <200 mg/dL 149 - 141 143 204(H)  HDL >50 mg/dL 64 - 49(L) 41(L) 41(L)  Calc LDL <100 mg/dL - - 70 67 131(H)  Triglycerides <150 mg/dL 81 - 112 175(H) 158(H)  Creatinine 0.50 - 0.99 mg/dL 0.92 0.92 1.09(H) 0.95 1.02(H)   BP/Weight 11/24/2016 11/11/2016 08/11/2016 07/20/2016 06/10/2016 03/24/2016 09/38/1829  Systolic BP 937 169 - 678 938 101 751  Diastolic BP 70 80 - 74 78 74 80  Wt. (Lbs) 272 272 280 280 277 283.04 285.6  BMI 41.36 41.36 42.57 42.57 42.12 43.04 43.43   Foot/eye exam completion dates Latest Ref Rng & Units 11/24/2016 11/10/2015  Eye Exam No Retinopathy - -  Foot Form Completion - Done Done

## 2016-11-25 ENCOUNTER — Other Ambulatory Visit (HOSPITAL_COMMUNITY)
Admission: RE | Admit: 2016-11-25 | Discharge: 2016-11-25 | Disposition: A | Discharge status: Home / Self Care | Payer: Medicare Other | Source: Other Acute Inpatient Hospital | Attending: Family Medicine | Admitting: Family Medicine

## 2016-11-25 DIAGNOSIS — E1169 Type 2 diabetes mellitus with other specified complication: Secondary | ICD-10-CM | POA: Diagnosis present

## 2016-11-26 LAB — MICROALBUMIN / CREATININE URINE RATIO
CREATININE, UR: 51.4 mg/dL
Microalb, Ur: <3 ug/mL — ABNORMAL HIGH

## 2016-12-13 ENCOUNTER — Other Ambulatory Visit: Payer: Self-pay | Admitting: Family Medicine

## 2016-12-13 DIAGNOSIS — IMO0001 Reserved for inherently not codable concepts without codable children: Secondary | ICD-10-CM

## 2016-12-28 ENCOUNTER — Other Ambulatory Visit: Payer: Self-pay | Admitting: Family Medicine

## 2016-12-28 DIAGNOSIS — I10 Essential (primary) hypertension: Secondary | ICD-10-CM

## 2016-12-28 NOTE — Telephone Encounter (Signed)
Seen 10 31 18

## 2017-02-15 ENCOUNTER — Other Ambulatory Visit: Payer: Self-pay | Admitting: Family Medicine

## 2017-02-15 DIAGNOSIS — IMO0001 Reserved for inherently not codable concepts without codable children: Secondary | ICD-10-CM

## 2017-03-10 ENCOUNTER — Telehealth: Payer: Self-pay | Admitting: Family Medicine

## 2017-03-10 NOTE — Telephone Encounter (Signed)
Pt is calling wants Antibotics, I told her Dr Moshe Cipro was unavailable and the other dr would want to see her to prescribe an antiobotic, she wants to talk to you. [phone]

## 2017-03-16 NOTE — Telephone Encounter (Signed)
Advised she needed to be seen to get antibiotics for abcessed tooth

## 2017-04-14 ENCOUNTER — Other Ambulatory Visit: Payer: Self-pay | Admitting: Family Medicine

## 2017-04-14 DIAGNOSIS — IMO0001 Reserved for inherently not codable concepts without codable children: Secondary | ICD-10-CM

## 2017-04-16 ENCOUNTER — Other Ambulatory Visit: Payer: Self-pay | Admitting: Family Medicine

## 2017-04-16 DIAGNOSIS — IMO0001 Reserved for inherently not codable concepts without codable children: Secondary | ICD-10-CM

## 2017-04-20 ENCOUNTER — Telehealth: Payer: Self-pay | Admitting: Family Medicine

## 2017-04-20 DIAGNOSIS — E034 Atrophy of thyroid (acquired): Secondary | ICD-10-CM

## 2017-04-20 DIAGNOSIS — E1169 Type 2 diabetes mellitus with other specified complication: Secondary | ICD-10-CM

## 2017-04-20 DIAGNOSIS — I10 Essential (primary) hypertension: Secondary | ICD-10-CM

## 2017-04-20 DIAGNOSIS — E785 Hyperlipidemia, unspecified: Secondary | ICD-10-CM

## 2017-04-20 DIAGNOSIS — E669 Obesity, unspecified: Secondary | ICD-10-CM

## 2017-04-20 NOTE — Telephone Encounter (Signed)
Labs ordered per PCP.

## 2017-04-21 DIAGNOSIS — E034 Atrophy of thyroid (acquired): Secondary | ICD-10-CM | POA: Diagnosis not present

## 2017-04-21 DIAGNOSIS — E785 Hyperlipidemia, unspecified: Secondary | ICD-10-CM | POA: Diagnosis not present

## 2017-04-21 DIAGNOSIS — I10 Essential (primary) hypertension: Secondary | ICD-10-CM | POA: Diagnosis not present

## 2017-04-21 DIAGNOSIS — E1169 Type 2 diabetes mellitus with other specified complication: Secondary | ICD-10-CM | POA: Diagnosis not present

## 2017-04-21 LAB — LIPID PANEL
CHOL/HDL RATIO: 4.7 (calc) (ref <5.0)
Cholesterol: 239 mg/dL — ABNORMAL HIGH (ref <200)
HDL: 51 mg/dL (ref >50)
LDL Cholesterol (Calc): 156 mg/dL (calc) — ABNORMAL HIGH
NON-HDL CHOLESTEROL (CALC): 188 mg/dL — AB (ref <130)
Triglycerides: 182 mg/dL — ABNORMAL HIGH (ref <150)

## 2017-04-21 LAB — HEMOGLOBIN A1C
EAG (MMOL/L): 7.1 (calc)
Hgb A1c MFr Bld: 6.1 % of total Hgb — ABNORMAL HIGH (ref <5.7)
MEAN PLASMA GLUCOSE: 128 (calc)

## 2017-04-21 LAB — CBC
HCT: 40.5 % (ref 35.0–45.0)
HEMOGLOBIN: 13.6 g/dL (ref 11.7–15.5)
MCH: 30.6 pg (ref 27.0–33.0)
MCHC: 33.6 g/dL (ref 32.0–36.0)
MCV: 91.2 fL (ref 80.0–100.0)
MPV: 10.1 fL (ref 7.5–12.5)
PLATELETS: 376 10*3/uL (ref 140–400)
RBC: 4.44 10*6/uL (ref 3.80–5.10)
RDW: 12.6 % (ref 11.0–15.0)
WBC: 8.8 10*3/uL (ref 3.8–10.8)

## 2017-04-21 LAB — COMPLETE METABOLIC PANEL WITH GFR
AG Ratio: 1.5 (calc) (ref 1.0–2.5)
ALT: 13 U/L (ref 6–29)
AST: 18 U/L (ref 10–35)
Albumin: 4.1 g/dL (ref 3.6–5.1)
Alkaline phosphatase (APISO): 86 U/L (ref 33–130)
BUN/Creatinine Ratio: 14 (calc) (ref 6–22)
BUN: 15 mg/dL (ref 7–25)
CALCIUM: 9.6 mg/dL (ref 8.6–10.4)
CO2: 30 mmol/L (ref 20–32)
CREATININE: 1.09 mg/dL — AB (ref 0.50–0.99)
Chloride: 103 mmol/L (ref 98–110)
GFR, EST AFRICAN AMERICAN: 63 mL/min/{1.73_m2} (ref >=60)
GFR, EST NON AFRICAN AMERICAN: 55 mL/min/{1.73_m2} — AB (ref >=60)
Globulin: 2.7 g/dL (calc) (ref 1.9–3.7)
Glucose, Bld: 101 mg/dL — ABNORMAL HIGH (ref 65–99)
POTASSIUM: 4.8 mmol/L (ref 3.5–5.3)
Sodium: 140 mmol/L (ref 135–146)
TOTAL PROTEIN: 6.8 g/dL (ref 6.1–8.1)
Total Bilirubin: 0.5 mg/dL (ref 0.2–1.2)

## 2017-04-21 LAB — TSH: TSH: 1.05 m[IU]/L (ref 0.40–4.50)

## 2017-04-25 ENCOUNTER — Encounter: Payer: Self-pay | Admitting: Family Medicine

## 2017-04-25 ENCOUNTER — Telehealth: Payer: Self-pay

## 2017-04-25 ENCOUNTER — Ambulatory Visit: Payer: Medicare Other | Admitting: Family Medicine

## 2017-04-25 VITALS — BP 120/82 | HR 80 | Resp 16 | Ht 68.0 in | Wt 276.4 lb

## 2017-04-25 DIAGNOSIS — F418 Other specified anxiety disorders: Secondary | ICD-10-CM

## 2017-04-25 DIAGNOSIS — E1169 Type 2 diabetes mellitus with other specified complication: Secondary | ICD-10-CM | POA: Diagnosis not present

## 2017-04-25 DIAGNOSIS — E669 Obesity, unspecified: Secondary | ICD-10-CM | POA: Diagnosis not present

## 2017-04-25 DIAGNOSIS — I10 Essential (primary) hypertension: Secondary | ICD-10-CM

## 2017-04-25 DIAGNOSIS — E038 Other specified hypothyroidism: Secondary | ICD-10-CM

## 2017-04-25 DIAGNOSIS — M5416 Radiculopathy, lumbar region: Secondary | ICD-10-CM

## 2017-04-25 DIAGNOSIS — Z1231 Encounter for screening mammogram for malignant neoplasm of breast: Secondary | ICD-10-CM

## 2017-04-25 DIAGNOSIS — IMO0001 Reserved for inherently not codable concepts without codable children: Secondary | ICD-10-CM

## 2017-04-25 MED ORDER — METHYLPREDNISOLONE ACETATE 80 MG/ML IJ SUSP
80.0000 mg | Freq: Once | INTRAMUSCULAR | Status: AC
  Administered 2017-04-25: 80 mg via INTRAMUSCULAR

## 2017-04-25 MED ORDER — LEVOTHYROXINE SODIUM 150 MCG PO TABS: 150.0000 ug | ORAL_TABLET | Freq: Every day | ORAL | 3 refills | Status: DC

## 2017-04-25 MED ORDER — TRAZODONE HCL 100 MG PO TABS: 100.0000 mg | ORAL_TABLET | Freq: Every day | ORAL | 3 refills | Status: DC

## 2017-04-25 MED ORDER — ATORVASTATIN CALCIUM 40 MG PO TABS: 40.0000 mg | ORAL_TABLET | Freq: Every day | ORAL | 3 refills | Status: DC

## 2017-04-25 MED ORDER — PREDNISONE 5 MG (21) PO TBPK: 5.0000 mg | ORAL_TABLET | ORAL | 0 refills | Status: DC

## 2017-04-25 MED ORDER — KETOROLAC TROMETHAMINE 60 MG/2ML IM SOLN
60.0000 mg | Freq: Once | INTRAMUSCULAR | Status: AC
  Administered 2017-04-25: 60 mg via INTRAMUSCULAR

## 2017-04-25 MED ORDER — CYCLOBENZAPRINE HCL 5 MG PO TABS: ORAL_TABLET | ORAL | 0 refills | Status: DC

## 2017-04-25 MED ORDER — TRAZODONE HCL 100 MG PO TABS: 100.0000 mg | ORAL_TABLET | Freq: Every day | ORAL | 5 refills | Status: DC

## 2017-04-25 MED ORDER — METFORMIN HCL 500 MG PO TABS: 500.0000 mg | ORAL_TABLET | Freq: Every day | ORAL | 3 refills | Status: DC

## 2017-04-25 NOTE — Assessment & Plan Note (Signed)
Uncontrolled.Toradol and depo medrol administered IM in the office , to be followed by a short course of oral prednisone and NSAIDS.  

## 2017-04-25 NOTE — Patient Instructions (Addendum)
F/u  in 2 months, call if you need me before  Iinjections today for back pain and prednisone dose pack is prescribed Discontinue pain contract as we have discussed  Behavioral health phone consult today, you need to continue this   Please start more regular exercise and commit to change in eatring to help with weight loss  Check with insurance re shingrix  I
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
## 2017-04-25 NOTE — Progress Notes (Signed)
[name]     MRN: [id number]      DOB: [birth year]   HPI [name] is here for follow up and re-evaluation of chronic medical conditions, medication management and review of any available recent lab and radiology data.  Preventive health is updated, specifically  Cancer screening and Immunization.   States she walks 1 mile / day for 3 days per week  Will try to stop eating no later than 6  Very depressed , not active;ly suicidals or homicidal, needs emergency behavioral health video conference Wants to leave current living situation at times but has no place to go to  ROS Denies recent fever or chills. Denies sinus pressure, nasal congestion, ear pain or sore throat. Denies chest congestion, productive cough or wheezing. Denies chest pains, palpitations and leg swelling Denies abdominal pain, nausea, vomiting,diarrhea or constipation.   Denies dysuria, frequency, hesitancy or incontinence.  Denies headaches, seizures, numbness, or tingling. Denies skin break down or rash.   PE  BP 120/82   Pulse 80   Resp 16   Ht 5\' 8"  (1.727 m)   Wt 276 lb 6.4 oz (125.4 kg)   SpO2 95%   BMI 42.03 kg/m   Patient alert and oriented and in no cardiopulmonary distress.  HEENT: No facial asymmetry, EOMI,   oropharynx pink and moist.  Neck supple no JVD, no mass.  Chest: Clear to auscultation bilaterally.  CVS: S1, S2 no murmurs, no S3.Regular rate.  ABD: Soft non tender.   Ext: No edema  MS: Decreased ROM spine, shoulders, hips and knees.  Skin: Intact, no ulcerations or rash noted.  Psych: Good eye contact, normal affect. Memory intact not anxious tearful and depressed appearing.  CNS: CN 2-12 intact, power,  normal throughout.no focal deficits noted.   Assessment & Plan  Radicular pain of right lower back Uncontrolled.Toradol and depo medrol administered IM in the office , to be followed by a short course of oral prednisone and NSAIDS.   Hypertension goal BP (blood  pressure) < 130/80 Controlled, no change in medication DASH diet and commitment to daily physical activity for a minimum of 30 minutes discussed and encouraged, as a part of hypertension management. The importance of attaining a healthy weight is also discussed.  BP/Weight 04/25/2017 11/24/2016 11/11/2016 08/11/2016 07/20/2016 06/10/2016 6/50/3546  Systolic BP 568 127 517 - 001 749 449  Diastolic BP 82 70 80 - 74 78 74  Wt. (Lbs) 276.4 272 272 280 280 277 283.04  BMI 42.03 41.36 41.36 42.57 42.57 42.12 43.04       Morbid obesity Deteriorated. Patient re-educated about  the importance of commitment to a  minimum of 150 minutes of exercise per week.  The importance of healthy food choices with portion control discussed. Encouraged to start a food diary, count calories and to consider  joining a support group. Sample diet sheets offered. Goals set by the patient for the next several months.   Weight /BMI 04/25/2017 11/24/2016 11/11/2016  WEIGHT 276 lb 6.4 oz 272 lb 272 lb  HEIGHT 5\' 8"  5\' 8"  5\' 8"   BMI 42.03 kg/m2 41.36 kg/m2 41.36 kg/m2      Depression with anxiety Reports poor response to SSRI, not actively suicidal or homicidal, she is to start outpatient day program  Hypothyroidism Controlled, no change in medication   Diabetes mellitus type 2 in obese [facility]) [name] is reminded of the importance of commitment to daily physical activity for 30 minutes or more, as able  and the need to limit carbohydrate intake to 30 to 60 grams per meal to help with blood sugar control.   The need to take medication as prescribed, test blood sugar as directed, and to call between visits if there is a concern that blood sugar is uncontrolled is also discussed.   [name] is reminded of the importance of daily foot exam, annual eye examination, and good blood sugar, blood pressure and cholesterol control.  Diabetic Labs Latest Ref Rng & Units 04/21/2017 11/25/2016 11/22/2016 07/14/2016 03/19/2016    HbA1c <5.7 % of total Hgb 6.1(H) - 6.4(H) 6.8(H) 6.4(H)  Microalbumin Not Estab. ug/mL - <3.0(H) - - -  Micro/Creat Ratio 0.0 - 30.0 mg/g creat - <5.8 - - -  Chol <200 mg/dL 239(H) - 149 - 141  HDL >50 mg/dL 51 - 64 - 49(L)  Calc LDL mg/dL (calc) 156(H) - 69 - 70  Triglycerides <150 mg/dL 182(H) - 81 - 112  Creatinine 0.50 - 0.99 mg/dL 1.09(H) - 0.92 0.92 1.09(H)   BP/Weight 04/25/2017 11/24/2016 11/11/2016 08/11/2016 07/20/2016 06/10/2016 08/05/1973  Systolic BP 883 254 982 - 641 583 094  Diastolic BP 82 70 80 - 74 78 74  Wt. (Lbs) 276.4 272 272 280 280 277 283.04  BMI 42.03 41.36 41.36 42.57 42.57 42.12 43.04   Foot/eye exam completion dates Latest Ref Rng & Units 11/24/2016 11/10/2015  Eye Exam No Retinopathy - -  Foot Form Completion - Done Done

## 2017-04-26 ENCOUNTER — Ambulatory Visit (INDEPENDENT_AMBULATORY_CARE_PROVIDER_SITE_OTHER): Payer: Medicare Other | Admitting: Psychiatry

## 2017-04-26 ENCOUNTER — Ambulatory Visit (HOSPITAL_COMMUNITY): Payer: Medicare Other

## 2017-04-26 DIAGNOSIS — F418 Other specified anxiety disorders: Secondary | ICD-10-CM
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
## 2017-04-27 ENCOUNTER — Telehealth: Payer: Self-pay

## 2017-04-27 DIAGNOSIS — F418 Other specified anxiety disorders: Secondary | ICD-10-CM
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
## 2017-04-28 NOTE — Assessment & Plan Note (Signed)
Controlled, no change in medication  

## 2017-04-28 NOTE — Assessment & Plan Note (Signed)
[name] is reminded of the importance of commitment to daily physical activity for 30 minutes or more, as able and the need to limit carbohydrate intake to 30 to 60 grams per meal to help with blood sugar control.   The need to take medication as prescribed, test blood sugar as directed, and to call between visits if there is a concern that blood sugar is uncontrolled is also discussed.   [name] is reminded of the importance of daily foot exam, annual eye examination, and good blood sugar, blood pressure and cholesterol control.  Diabetic Labs Latest Ref Rng & Units 04/21/2017 11/25/2016 11/22/2016 07/14/2016 03/19/2016  HbA1c <5.7 % of total Hgb 6.1(H) - 6.4(H) 6.8(H) 6.4(H)  Microalbumin Not Estab. ug/mL - <3.0(H) - - -  Micro/Creat Ratio 0.0 - 30.0 mg/g creat - <5.8 - - -  Chol <200 mg/dL 239(H) - 149 - 141  HDL >50 mg/dL 51 - 64 - 49(L)  Calc LDL mg/dL (calc) 156(H) - 69 - 70  Triglycerides <150 mg/dL 182(H) - 81 - 112  Creatinine 0.50 - 0.99 mg/dL 1.09(H) - 0.92 0.92 1.09(H)   BP/Weight 04/25/2017 11/24/2016 11/11/2016 08/11/2016 07/20/2016 06/10/2016 0/27/2536  Systolic BP 644 034 742 - 595 638 756  Diastolic BP 82 70 80 - 74 78 74  Wt. (Lbs) 276.4 272 272 280 280 277 283.04  BMI 42.03 41.36 41.36 42.57 42.57 42.12 43.04   Foot/eye exam completion dates Latest Ref Rng & Units 11/24/2016 11/10/2015  Eye Exam No Retinopathy - -  Foot Form Completion - Done Done

## 2017-04-28 NOTE — Assessment & Plan Note (Signed)
Controlled, no change in medication DASH diet and commitment to daily physical activity for a minimum of 30 minutes discussed and encouraged, as a part of hypertension management. The importance of attaining a healthy weight is also discussed.  BP/Weight 04/25/2017 11/24/2016 11/11/2016 08/11/2016 07/20/2016 06/10/2016 03/02/154  Systolic BP 153 794 327 - 614 709 295  Diastolic BP 82 70 80 - 74 78 74  Wt. (Lbs) 276.4 272 272 280 280 277 283.04  BMI 42.03 41.36 41.36 42.57 42.57 42.12 43.04

## 2017-04-28 NOTE — Assessment & Plan Note (Signed)
Deteriorated. Patient re-educated about  the importance of commitment to a  minimum of 150 minutes of exercise per week.  The importance of healthy food choices with portion control discussed. Encouraged to start a food diary, count calories and to consider  joining a support group. Sample diet sheets offered. Goals set by the patient for the next several months.   Weight /BMI 04/25/2017 11/24/2016 11/11/2016  WEIGHT 276 lb 6.4 oz 272 lb 272 lb  HEIGHT 5\' 8"  5\' 8"  5\' 8"   BMI 42.03 kg/m2 41.36 kg/m2 41.36 kg/m2

## 2017-04-28 NOTE — Assessment & Plan Note (Signed)
Reports poor response to SSRI, not actively suicidal or homicidal, she is to start outpatient day program

## 2017-05-12 ENCOUNTER — Telehealth: Payer: Self-pay
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
## 2017-05-18 ENCOUNTER — Telehealth: Payer: Self-pay

## 2017-05-18 ENCOUNTER — Other Ambulatory Visit: Payer: Self-pay | Admitting: Family Medicine

## 2017-05-18 DIAGNOSIS — F418 Other specified anxiety disorders: Secondary | ICD-10-CM

## 2017-05-18 MED ORDER — OXYCODONE-ACETAMINOPHEN 7.5-325 MG PO TABS: ORAL_TABLET | ORAL | 0 refills | Status: DC

## 2017-05-18 NOTE — BH Specialist Note (Signed)
[place] Telephone Follow-up  MRN: [id number] NAME: NALANI ANDREEN Date: 06-10-17   Total time: 15 minutes Call number: 2/6  Reason for call today: Reason for Contact: PHQ9-2 weeks  PHQ-9 Scores:  Depression screen [facility] 2/9 2017/06/10 04/25/2017 04/25/2017 12/09/2015 06/04/2015  Decreased Interest 1 3 3  0 2  Down, Depressed, Hopeless 1 3 3  0 3  PHQ - 2 Score 2 6 6  0 5  Altered sleeping 1 3 2  - 3  Tired, decreased energy 2 3 3  - 3  Change in appetite 0 3 3 - 1  Feeling bad or failure about yourself  2 3 3  - 2  Trouble concentrating 1 3 3  - 1  Moving slowly or fidgety/restless 1 3 0 - 0  Suicidal thoughts 0 3 0 - 1  PHQ-9 Score 9 27 20  - 16  Difficult doing work/chores Somewhat difficult Extremely dIfficult Somewhat difficult - -  Some recent data might be hidden   GAD-7 Scores:  GAD 7 : Generalized Anxiety Score Jun 10, 2017  Nervous, Anxious, on Edge 0  Control/stop worrying 0  Worry too much - different things 0  Trouble relaxing 0  Restless 0  Easily annoyed or irritable 0  Afraid - awful might happen 0  Total GAD 7 Score 0    Stress Current stressors: 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 aspirin 81 MG tablet Take 81 mg by mouth daily.    Marland Kitchen atorvastatin (LIPITOR) 40 MG tablet Take 1 tablet (40 mg total) by mouth daily.  . calcium-vitamin D (OSCAL WITH D) 500-200 MG-UNIT per tablet Take 1 tablet by mouth daily.  . cyclobenzaprine (FLEXERIL) 5 MG tablet TAKE ONE TABLET BY MOUTH AT BEDTIME AS NEEDED. SHOULD LAST SIX MONTHS.  Marland Kitchen cyclobenzaprine (FLEXERIL) 5 MG tablet One tablet at bedtime as needed for back spasm  . Dexlansoprazole (DEXILANT) 30 MG capsule Take  30 mg by mouth daily.  Marland Kitchen gabapentin (NEURONTIN) 300 MG capsule TAKE ONE CAPSULE BY MOUTH THREE TIMES DAILY.  Marland Kitchen ibuprofen (ADVIL,MOTRIN) 800 MG tablet TAKE ONE TABLET BY MOUTH EVERY 8 HOURS AS NEEDED.  Marland Kitchen levothyroxine (SYNTHROID, LEVOTHROID) 150 MCG tablet Take 1 tablet (150 mcg total) by mouth daily.  . metFORMIN (GLUCOPHAGE) 500 MG tablet Take 1 tablet (500 mg total) by mouth daily with breakfast.  . predniSONE (STERAPRED UNI-PAK 21 TAB) 5 MG (21) TBPK tablet Take 1 tablet (5 mg total) by mouth as directed. Use as directed  . traZODone (DESYREL) 100 MG tablet Take 1 tablet (100 mg total) by mouth at bedtime.   Facility-Administered Encounter Medications as of 06/10/17  Medication  . Influenza (>/= 3 years) inactive virus vaccine (FLVIRIN/FLUZONE) injection SUSP 0.5 mL     Self-harm Behaviors Risk Assessment Self-harm risk factors: Self-harm risk factors: (None Reported) Patient endorses recent thoughts of harming self: Have you recently had any thoughts about harming yourself?: No  [place] Suicide Severity Rating Scale: No flowsheet data found. C-SRSS 04/25/2017 06-10-2017  1. Wish to be Dead No No  2. Suicidal Thoughts Yes No  3. Suicidal Thoughts with Method Without Specific Plan or Intent to Act No No  4. Suicidal Intent Without Specific Plan No No  5. Suicide Intent with Specific Plan No No  6.  Suicide Behavior Question No No  How long ago did you do any of these? Within the last three months Between three months and a year ago     Danger to Others Risk Assessment Danger to others risk factors: Danger to Others Risk Factors: No risk factors noted Patient endorses recent thoughts of harming others: Notification required: No need or identified person  Dynamic Appraisal of Situational Aggression (DASA): No flowsheet data found.   Substance Use Assessment Patient recently consumed alcohol:    Alcohol Use Disorder Identification Test (AUDIT): No flowsheet data found. Patient  recently used drugs:    Opioid Risk Assessment:    Goals, Interventions and Follow-up Plan Goals: Increase healthy adjustment to current life circumstances Interventions: Motivational Interviewing, Brief CBT and Supportive Counseling Follow-up Plan: [facility] Phone Follow Up   Summary:   Patient reports decreased depression.  Patient reports that she did go to the PHP intake Assessment but her insurance would not pay for the services. Patient reports that her BC/BS will not pay for outpatient therapy either.   Patient was very annoyed that she was not able to participate in the group PHP program.    Johnnye Sima, Bassam Dresch [place], LCAS-A

## 2017-05-18 NOTE — Telephone Encounter (Signed)
I spoke directly with the patient she is  sitting part time and is out of the hopuse more , not in therapy rigth  Now can't afford, will send in 10 tabs to [place], [facility]

## 2017-05-18 NOTE — Progress Notes (Signed)
Oxycodone  °

## 2017-05-24 ENCOUNTER — Telehealth: Payer: Self-pay

## 2017-05-24 DIAGNOSIS — F418 Other specified anxiety disorders: Secondary | ICD-10-CM

## 2017-05-24 NOTE — BH Specialist Note (Signed)
[place] Telephone Follow-up  MRN: [id number] NAME: [name] Date: 05/24/17   Total time: 20 minutes Call number: 3/6  Reason for call today: Reason for Contact: PHQ9-Discharge  PHQ-9 Scores:  Depression screen [facility] 2/9 05/24/2017 05/18/2017 04/25/2017 04/25/2017 12/09/2015  Decreased Interest 2 1 3 3  0  Down, Depressed, Hopeless 1 1 3 3  0  PHQ - 2 Score 3 2 6 6  0  Altered sleeping 0 1 3 2  -  Tired, decreased energy 0 2 3 3  -  Change in appetite 1 0 3 3 -  Feeling bad or failure about yourself  1 2 3 3  -  Trouble concentrating 0 1 3 3  -  Moving slowly or fidgety/restless 0 1 3 0 -  Suicidal thoughts 0 0 3 0 -  PHQ-9 Score 5 9 27 20  -  Difficult doing work/chores - Somewhat difficult Extremely dIfficult Somewhat difficult -  Some recent data might be hidden     GAD-7 Scores: GAD 7 : Generalized Anxiety Score 05/18/2017  Nervous, Anxious, on Edge 0  Control/stop worrying 0  Worry too much - different things 0  Trouble relaxing 0  Restless 0  Easily annoyed or irritable 0  Afraid - awful might happen 0  Total GAD 7 Score 0  :    Stress Current stressors: Current Stressors: (Insurance will not pay for her mental health services, marital stress ) Sleep: Sleep: No problems Appetite: Appetite: No problems Coping ability: Coping ability: Deficient support system   Patient taking medications as prescribed: Patient taking medications as prescribed: Yes  Current medications:  Outpatient Encounter Medications as of 05/24/2017  Medication Sig  . aspirin 81 MG tablet Take 81 mg by mouth daily.    Marland Kitchen atorvastatin (LIPITOR) 40 MG tablet Take 1 tablet (40 mg total) by mouth daily.  . calcium-vitamin D (OSCAL WITH D) 500-200 MG-UNIT per tablet Take 1 tablet by mouth daily.  . cyclobenzaprine (FLEXERIL) 5 MG tablet TAKE ONE TABLET BY MOUTH AT BEDTIME AS NEEDED. SHOULD LAST SIX MONTHS.  Marland Kitchen cyclobenzaprine (FLEXERIL) 5 MG tablet One tablet at bedtime as needed for back  spasm  . Dexlansoprazole (DEXILANT) 30 MG capsule Take 30 mg by mouth daily.  Marland Kitchen gabapentin (NEURONTIN) 300 MG capsule TAKE ONE CAPSULE BY MOUTH THREE TIMES DAILY.  Marland Kitchen ibuprofen (ADVIL,MOTRIN) 800 MG tablet TAKE ONE TABLET BY MOUTH EVERY 8 HOURS AS NEEDED.  Marland Kitchen levothyroxine (SYNTHROID, LEVOTHROID) 150 MCG tablet Take 1 tablet (150 mcg total) by mouth daily.  . metFORMIN (GLUCOPHAGE) 500 MG tablet Take 1 tablet (500 mg total) by mouth daily with breakfast.  . oxyCODONE-acetaminophen (PERCOCET) 7.5-325 MG tablet One tablet once daily as needed, for uncontrolled back pain  . predniSONE (STERAPRED UNI-PAK 21 TAB) 5 MG (21) TBPK tablet Take 1 tablet (5 mg total) by mouth as directed. Use as directed  . traZODone (DESYREL) 100 MG tablet Take 1 tablet (100 mg total) by mouth at bedtime.   Facility-Administered Encounter Medications as of 05/24/2017  Medication  . Influenza (>/= 3 years) inactive virus vaccine (FLVIRIN/FLUZONE) injection SUSP 0.5 mL     Self-harm Behaviors Risk Assessment Self-harm risk factors: Self-harm risk factors: (NA) Patient endorses recent thoughts of harming self: Have you recently had any thoughts about harming yourself?: No    Danger to Others Risk Assessment Danger to others risk factors: Danger to Others Risk Factors: No risk factors noted Patient endorses recent thoughts of harming others: Notification required: No need or identified person  Goals, Interventions and Follow-up Plan Goals: Disharge Interventions: Motivational Interviewing and Supportive Counseling Follow-up Plan: Contact [facility] on the crisis lineif she would like to resume services.   Summary:   Patient reports that she does not want services [facility] or community based services.  Patient did not want me to provide her with any agencies that provided sliding scale services based on her income.    Patient reports that she continues to be annoyed that her insurance was not able to pay for  counseling services.  Patient reports that she did appreciate me calling but she no longer needs or wants services.   Writer reminded patient that if she would like to have services reinstated that she can contact me on the crisis line [place], [place], LCAS-A

## 2017-06-22 ENCOUNTER — Encounter (HOSPITAL_COMMUNITY): Payer: Self-pay

## 2017-06-22 ENCOUNTER — Ambulatory Visit (HOSPITAL_COMMUNITY)
Admission: RE | Admit: 2017-06-22 | Discharge: 2017-06-22 | Disposition: A | Discharge status: Home / Self Care | Payer: Medicare Other | Source: Ambulatory Visit | Attending: Family Medicine | Admitting: Family Medicine

## 2017-06-22 DIAGNOSIS — Z1231 Encounter for screening mammogram for malignant neoplasm of breast: Secondary | ICD-10-CM | POA: Diagnosis not present

## 2017-06-24 ENCOUNTER — Other Ambulatory Visit: Payer: Self-pay | Admitting: Family Medicine

## 2017-06-24 DIAGNOSIS — IMO0001 Reserved for inherently not codable concepts without codable children: Secondary | ICD-10-CM

## 2017-07-18 ENCOUNTER — Telehealth: Payer: Self-pay | Admitting: Family Medicine

## 2017-07-18 NOTE — Telephone Encounter (Signed)
Patient is going to Lab in the am.  Please send order to lab next door.

## 2017-07-18 NOTE — Telephone Encounter (Signed)
None due and patient aware

## 2017-07-21 ENCOUNTER — Encounter: Payer: Self-pay | Admitting: Family Medicine

## 2017-07-21 ENCOUNTER — Ambulatory Visit: Payer: Medicare Other | Admitting: Family Medicine

## 2017-07-21 VITALS — BP 112/80 | HR 78 | Resp 16 | Ht 68.0 in | Wt 278.0 lb

## 2017-07-21 DIAGNOSIS — F418 Other specified anxiety disorders: Secondary | ICD-10-CM

## 2017-07-21 DIAGNOSIS — I10 Essential (primary) hypertension: Secondary | ICD-10-CM

## 2017-07-21 DIAGNOSIS — E785 Hyperlipidemia, unspecified: Secondary | ICD-10-CM

## 2017-07-21 DIAGNOSIS — Z1211 Encounter for screening for malignant neoplasm of colon: Secondary | ICD-10-CM

## 2017-07-21 DIAGNOSIS — E1169 Type 2 diabetes mellitus with other specified complication: Secondary | ICD-10-CM

## 2017-07-21 DIAGNOSIS — Z23 Encounter for immunization: Secondary | ICD-10-CM

## 2017-07-21 DIAGNOSIS — E669 Obesity, unspecified: Secondary | ICD-10-CM | POA: Diagnosis not present

## 2017-07-21 LAB — COMPLETE METABOLIC PANEL WITH GFR
AG RATIO: 1.4 (calc) (ref 1.0–2.5)
ALBUMIN MSPROF: 4.3 g/dL (ref 3.6–5.1)
ALT: 14 U/L (ref 6–29)
AST: 15 U/L (ref 10–35)
Alkaline phosphatase (APISO): 108 U/L (ref 33–130)
BILIRUBIN TOTAL: 0.7 mg/dL (ref 0.2–1.2)
BUN: 15 mg/dL (ref 7–25)
CALCIUM: 10.2 mg/dL (ref 8.6–10.4)
CHLORIDE: 103 mmol/L (ref 98–110)
CO2: 31 mmol/L (ref 20–32)
Creat: 0.92 mg/dL (ref 0.50–0.99)
GFR, EST AFRICAN AMERICAN: 78 mL/min/{1.73_m2} (ref >=60)
GFR, EST NON AFRICAN AMERICAN: 67 mL/min/{1.73_m2} (ref >=60)
GLOBULIN: 3 g/dL (ref 1.9–3.7)
Glucose, Bld: 124 mg/dL — ABNORMAL HIGH (ref 65–99)
POTASSIUM: 5.5 mmol/L — AB (ref 3.5–5.3)
SODIUM: 142 mmol/L (ref 135–146)
TOTAL PROTEIN: 7.3 g/dL (ref 6.1–8.1)

## 2017-07-21 LAB — LIPID PANEL
CHOL/HDL RATIO: 2.9 (calc) (ref <5.0)
Cholesterol: 158 mg/dL (ref <200)
HDL: 54 mg/dL (ref >50)
LDL Cholesterol (Calc): 82 mg/dL (calc)
NON-HDL CHOLESTEROL (CALC): 104 mg/dL (ref <130)
Triglycerides: 127 mg/dL (ref <150)

## 2017-07-21 NOTE — Patient Instructions (Addendum)
Annual physical exam early November, call if you need me sooner  As discussed, stop lipitor due to intolerance  You are referred to for diabetic eye exam  Lab today lipid and cm,p and EGFR   Shingrix #1 today  You are referred to Dr Fuller Plan for colonoscopy due in the Fall  Weight loss goal of 10 to 12 pounds  It is important that you exercise regularly at least 30 minutes 5 times a week. If you develop chest pain, have severe difficulty breathing, or feel very tired, stop exercising immediately and seek medical attention

## 2017-07-21 NOTE — Progress Notes (Signed)
Fill Date ID Written Drug Qty Days Prescriber Rx # Pharmacy Refill Daily Dose * Pymt Type PMP  05/18/2017  1  05/18/2017  Oxycodon-Acetaminophen 7.5-325  10 10 Ma Sim  [id number]  [facility] ([id number]  0 11.25 MME Comm Ins  [place]  11/24/2016  1  11/24/2016  Oxycodone-Acetaminophen 5-325  30 90 Ma Sim  [id number]  [facility] (0019)  0 2.50 MME Comm Ins  [place]  07/20/2016  1  07/20/2016  Oxycodone-Acetaminophen 5-325  90 90 Ma Sim  [id number]  [facility] (0019)  0 7.50 MME Medicare  [place]  03/24/2016  1  03/24/2016  Oxycodone-Acetaminophen 5-325  30 90 Ma Sim  [id number]  [facility] (0019)  0 2.50 MME Medicare  [place]  11/10/2015  1  11/10/2015  Oxycodone-Acetaminophen 5-325  26 71 Ma Sim  [id number]  [facility] (0019)  0 2.17 MME Medicare  [place]  08/25/2015  1  08/25/2015  Hydrocodone-Acetamin 5-325 Mg

## 2017-07-24 ENCOUNTER — Encounter: Payer: Self-pay | Admitting: Family Medicine

## 2017-07-24 DIAGNOSIS — Z789 Other specified health status: Secondary | ICD-10-CM | POA: Insufficient documentation

## 2017-07-24 NOTE — Assessment & Plan Note (Signed)
[name] is reminded of the importance of commitment to daily physical activity for 30 minutes or more, as able and the need to limit carbohydrate intake to 30 to 60 grams per meal to help with blood sugar control.   The need to take medication as prescribed, test blood sugar as directed, and to call between visits if there is a concern that blood sugar is uncontrolled is also discussed.   [name] is reminded of the importance of daily foot exam, annual eye examination, and good blood sugar, blood pressure and cholesterol control.  Diabetic Labs Latest Ref Rng & Units 07/21/2017 04/21/2017 11/25/2016 11/22/2016 07/14/2016  HbA1c <5.7 % of total Hgb - 6.1(H) - 6.4(H) 6.8(H)  Microalbumin Not Estab. ug/mL - - <3.0(H) - -  Micro/Creat Ratio 0.0 - 30.0 mg/g creat - - <5.8 - -  Chol <200 mg/dL 158 239(H) - 149 -  HDL >50 mg/dL 54 51 - 64 -  Calc LDL mg/dL (calc) 82 156(H) - 69 -  Triglycerides <150 mg/dL 127 182(H) - 81 -  Creatinine 0.50 - 0.99 mg/dL 0.92 1.09(H) - 0.92 0.92   BP/Weight 07/21/2017 04/25/2017 11/24/2016 11/11/2016 08/11/2016 07/20/2016 0/17/4944  Systolic BP 967 591 638 466 - 599 357  Diastolic BP 80 82 70 80 - 74 78  Wt. (Lbs) 278 276.4 272 272 280 280 277  BMI 42.27 42.03 41.36 41.36 42.57 42.57 42.12   Foot/eye exam completion dates Latest Ref Rng & Units 11/24/2016 11/10/2015  Eye Exam No Retinopathy - -  Foot Form Completion - Done Done  needs eye exam, referral is entered

## 2017-07-24 NOTE — Assessment & Plan Note (Signed)
Controlled, however pt reports statin intolerance , so electing to d/c statin despite the clear  Indication, she feels that it causes her have increase apetite Hyperlipidemia:Low fat diet discussed and encouraged.   Lipid Panel  Lab Results  Component Value Date   CHOL 158 07/21/2017   HDL 54 07/21/2017   LDLCALC 82 07/21/2017   TRIG 127 07/21/2017   CHOLHDL 2.9 07/21/2017

## 2017-07-24 NOTE — Progress Notes (Signed)
[name]     MRN: [id number]      DOB: [birth year]   HPI [name] is here for follow up and re-evaluation of chronic medical conditions, medication management and review of any available recent lab and radiology data.  Preventive health is updated, specifically  Cancer screening and Immunization.  Colonoscopy and eye exam are overdue, feels she is in a better financial position to arrange for thm Has started sitting with her ex Mom in law who is in hospice, which has improved her mental and financial health She has used ony 2 of the pain pills prescribed and needs none, she has 8 remaining Working on weight loss through diet, says no time to exercise The PT denies any adverse reactions to current medications since the last visit.  Wants to stop the statin, feels it makes her eat more and may increase her pain   ROS Denies recent fever or chills. Denies sinus pressure, nasal congestion, ear pain or sore throat. Denies chest congestion, productive cough or wheezing. Denies chest pains, palpitations and leg swelling Denies abdominal pain, nausea, vomiting,diarrhea or constipation.   Denies dysuria, frequency, hesitancy or incontinence. C/o chronic  joint pain, swelling and limitation in mobility. Denies headaches, seizures, numbness, or tingling. Deni uncontrolled  depression, anxiety or insomnia. Denies skin break down or rash.   PE  BP 112/80   Pulse 78   Resp 16   Ht 5\' 8"  (1.727 m)   Wt 278 lb (126.1 kg)   SpO2 97%   BMI 42.27 kg/m   Patient alert and oriented and in no cardiopulmonary distress.  HEENT: No facial asymmetry, EOMI,   oropharynx pink and moist.  Neck supple no JVD, no mass.  Chest: Clear to auscultation bilaterally.  CVS: S1, S2 no murmurs, no S3.Regular rate.  ABD: Soft non tender.   Ext: No edema  MS: Adequate though reduced ROM spine,nornal in shoulders, hips and knees.  Skin: Intact, no ulcerations or rash noted.  Psych: Good eye  contact, normal affect. Memory intact not anxious or depressed appearing.  CNS: CN 2-12 intact, power,  normal throughout.no focal deficits noted.   Assessment & Plan  Depression with anxiety Improved, mainly because she is gaining some idependence and is out of the house more   Morbid obesity Deteriorated. Patient re-educated about  the importance of commitment to a  minimum of 150 minutes of exercise per week.  The importance of healthy food choices with portion control discussed. Encouraged to start a food diary, count calories and to consider  joining a support group. Sample diet sheets offered. Goals set by the patient for the next several months.   Weight /BMI 07/21/2017 04/25/2017 11/24/2016  WEIGHT 278 lb 276 lb 6.4 oz 272 lb  HEIGHT 5\' 8"  5\' 8"  5\' 8"   BMI 42.27 kg/m2 42.03 kg/m2 41.36 kg/m2      Hyperlipidemia Controlled, however pt reports statin intolerance , so electing to d/c statin despite the clear  Indication, she feels that it causes her have increase apetite Hyperlipidemia:Low fat diet discussed and encouraged.   Lipid Panel  Lab Results  Component Value Date   CHOL 158 07/21/2017   HDL 54 07/21/2017   LDLCALC 82 07/21/2017   TRIG 127 07/21/2017   CHOLHDL 2.9 07/21/2017       Diabetes mellitus type 2 in obese [facility]) Ms. Ciolino is reminded of the importance of commitment to daily physical activity for 30 minutes or more, as able and the  need to limit carbohydrate intake to 30 to 60 grams per meal to help with blood sugar control.   The need to take medication as prescribed, test blood sugar as directed, and to call between visits if there is a concern that blood sugar is uncontrolled is also discussed.   Ms. Gramlich is reminded of the importance of daily foot exam, annual eye examination, and good blood sugar, blood pressure and cholesterol control.  Diabetic Labs Latest Ref Rng & Units 07/21/2017 04/21/2017 11/25/2016 11/22/2016 07/14/2016  HbA1c <5.7 % of  total Hgb - 6.1(H) - 6.4(H) 6.8(H)  Microalbumin Not Estab. ug/mL - - <3.0(H) - -  Micro/Creat Ratio 0.0 - 30.0 mg/g creat - - <5.8 - -  Chol <200 mg/dL 158 239(H) - 149 -  HDL >50 mg/dL 54 51 - 64 -  Calc LDL mg/dL (calc) 82 156(H) - 69 -  Triglycerides <150 mg/dL 127 182(H) - 81 -  Creatinine 0.50 - 0.99 mg/dL 0.92 1.09(H) - 0.92 0.92   BP/Weight 07/21/2017 04/25/2017 11/24/2016 11/11/2016 08/11/2016 07/20/2016 9/93/5701  Systolic BP 779 390 300 923 - 300 762  Diastolic BP 80 82 70 80 - 74 78  Wt. (Lbs) 278 276.4 272 272 280 280 277  BMI 42.27 42.03 41.36 41.36 42.57 42.57 42.12   Foot/eye exam completion dates Latest Ref Rng & Units 11/24/2016 11/10/2015  Eye Exam No Retinopathy - -  Foot Form Completion - Done Done  needs eye exam, referral is entered

## 2017-07-24 NOTE — Assessment & Plan Note (Signed)
Deteriorated. Patient re-educated about  the importance of commitment to a  minimum of 150 minutes of exercise per week.  The importance of healthy food choices with portion control discussed. Encouraged to start a food diary, count calories and to consider  joining a support group. Sample diet sheets offered. Goals set by the patient for the next several months.   Weight /BMI 07/21/2017 04/25/2017 11/24/2016  WEIGHT 278 lb 276 lb 6.4 oz 272 lb  HEIGHT 5\' 8"  5\' 8"  5\' 8"   BMI 42.27 kg/m2 42.03 kg/m2 41.36 kg/m2

## 2017-07-24 NOTE — Assessment & Plan Note (Signed)
Improved, mainly because she is gaining some idependence and is out of the house more

## 2017-09-12 ENCOUNTER — Other Ambulatory Visit: Payer: Self-pay | Admitting: Family Medicine

## 2017-09-12 DIAGNOSIS — IMO0001 Reserved for inherently not codable concepts without codable children: Secondary | ICD-10-CM

## 2017-09-27 DIAGNOSIS — H524 Presbyopia: Secondary | ICD-10-CM | POA: Diagnosis not present

## 2017-09-27 DIAGNOSIS — E119 Type 2 diabetes mellitus without complications: Secondary | ICD-10-CM | POA: Diagnosis not present

## 2017-09-27 DIAGNOSIS — H2513 Age-related nuclear cataract, bilateral: Secondary | ICD-10-CM | POA: Diagnosis not present

## 2017-09-27 LAB — HM DIABETES EYE EXAM

## 2017-10-17 ENCOUNTER — Other Ambulatory Visit: Payer: Self-pay | Admitting: Family Medicine

## 2017-10-25 ENCOUNTER — Encounter: Payer: Self-pay | Admitting: Gastroenterology

## 2017-11-03 ENCOUNTER — Encounter: Payer: Self-pay | Admitting: Internal Medicine

## 2017-11-14 ENCOUNTER — Other Ambulatory Visit: Payer: Self-pay | Admitting: Family Medicine

## 2017-11-29 ENCOUNTER — Ambulatory Visit: Payer: Self-pay | Admitting: Gastroenterology

## 2017-12-05 ENCOUNTER — Ambulatory Visit (AMBULATORY_SURGERY_CENTER): Payer: Self-pay | Admitting: *Deleted

## 2017-12-05 ENCOUNTER — Telehealth: Payer: Self-pay

## 2017-12-05 ENCOUNTER — Telehealth: Payer: Self-pay | Admitting: *Deleted

## 2017-12-05 ENCOUNTER — Telehealth: Payer: Self-pay | Admitting: Gastroenterology

## 2017-12-05 ENCOUNTER — Encounter: Payer: Self-pay | Admitting: Gastroenterology

## 2017-12-05 VITALS — Ht 67.0 in | Wt 292.0 lb

## 2017-12-05 DIAGNOSIS — E785 Hyperlipidemia, unspecified: Secondary | ICD-10-CM | POA: Diagnosis not present

## 2017-12-05 DIAGNOSIS — E1169 Type 2 diabetes mellitus with other specified complication: Secondary | ICD-10-CM

## 2017-12-05 DIAGNOSIS — Z1211 Encounter for screening for malignant neoplasm of colon: Secondary | ICD-10-CM

## 2017-12-05 DIAGNOSIS — E669 Obesity, unspecified: Secondary | ICD-10-CM | POA: Diagnosis not present

## 2017-12-05 MED ORDER — NA SULFATE-K SULFATE-MG SULF 17.5-3.13-1.6 GM/177ML PO SOLN: 1.0000 | Freq: Once | ORAL | 0 refills | Status: AC

## 2017-12-05 NOTE — Telephone Encounter (Signed)
Already printed a lab order and gave them to patient

## 2017-12-05 NOTE — Telephone Encounter (Signed)
Pt wanting to have [facility] before she comes in for appt  Wanting to come today nurse please call patient when sent

## 2017-12-05 NOTE — Telephone Encounter (Signed)
Labs ordered.

## 2017-12-05 NOTE — Progress Notes (Signed)
No egg or soy allergy known to patient  No issues with past sedation with any surgeries  or procedures, no intubation problems  No diet pills per patient No home 02 use per patient  No blood thinners per patient  Pt denies issues with constipation  No A fib or A flutter  EMMI video sent to pt's e mail pt declined  Suprep PNM $50 coupon to pt

## 2017-12-05 NOTE — Telephone Encounter (Signed)
Pharmacy notified pt  Has pay no more than $50 coupon she will bring for the prep-  If still issues after the coupon, call us back   [facility]

## 2017-12-08 ENCOUNTER — Ambulatory Visit (INDEPENDENT_AMBULATORY_CARE_PROVIDER_SITE_OTHER): Payer: Medicare Other | Admitting: Family Medicine

## 2017-12-08 ENCOUNTER — Encounter: Payer: Self-pay | Admitting: Family Medicine

## 2017-12-08 VITALS — BP 112/80 | HR 93 | Resp 16 | Ht 67.0 in | Wt 293.0 lb

## 2017-12-08 DIAGNOSIS — M5410 Radiculopathy, site unspecified: Secondary | ICD-10-CM | POA: Diagnosis not present

## 2017-12-08 DIAGNOSIS — E1140 Type 2 diabetes mellitus with diabetic neuropathy, unspecified: Secondary | ICD-10-CM | POA: Diagnosis not present

## 2017-12-08 DIAGNOSIS — Z23 Encounter for immunization: Secondary | ICD-10-CM | POA: Diagnosis not present

## 2017-12-08 DIAGNOSIS — E785 Hyperlipidemia, unspecified: Secondary | ICD-10-CM

## 2017-12-08 DIAGNOSIS — IMO0002 Reserved for concepts with insufficient information to code with codable children: Secondary | ICD-10-CM

## 2017-12-08 DIAGNOSIS — E038 Other specified hypothyroidism: Secondary | ICD-10-CM

## 2017-12-08 DIAGNOSIS — E1165 Type 2 diabetes mellitus with hyperglycemia: Secondary | ICD-10-CM

## 2017-12-08 DIAGNOSIS — Z0000 Encounter for general adult medical examination without abnormal findings: Secondary | ICD-10-CM

## 2017-12-08 DIAGNOSIS — I10 Essential (primary) hypertension: Secondary | ICD-10-CM

## 2017-12-08 DIAGNOSIS — E8881 Metabolic syndrome: Secondary | ICD-10-CM

## 2017-12-08 MED ORDER — METFORMIN HCL 500 MG PO TABS: ORAL_TABLET | ORAL | 3 refills | Status: DC

## 2017-12-08 MED ORDER — OXYCODONE-ACETAMINOPHEN 7.5-325 MG PO TABS: ORAL_TABLET | ORAL | 0 refills | Status: DC

## 2017-12-08 NOTE — Assessment & Plan Note (Signed)
Deteriorated, increase metformin to 3 daily, and change eating habits [name] is reminded of the importance of commitment to daily physical activity for 30 minutes or more, as able and the need to limit carbohydrate intake to 30 to 60 grams per meal to help with blood sugar control.   The need to take medication as prescribed, test blood sugar as directed, and to call between visits if there is a concern that blood sugar is uncontrolled is also discussed.   [name] is reminded of the importance of daily foot exam, annual eye examination, and good blood sugar, blood pressure and cholesterol control.  Diabetic Labs Latest Ref Rng & Units 12/05/2017 07/21/2017 04/21/2017 11/25/2016 11/22/2016  HbA1c <5.7 % of total Hgb 7.3(H) - 6.1(H) - 6.4(H)  Microalbumin Not Estab. ug/mL - - - <3.0(H) -  Micro/Creat Ratio 0.0 - 30.0 mg/g creat - - - <5.8 -  Chol <200 mg/dL 234(H) 158 239(H) - 149  HDL >50 mg/dL 45(L) 54 51 - 64  Calc LDL mg/dL (calc) 157(H) 82 156(H) - 69  Triglycerides <150 mg/dL 186(H) 127 182(H) - 81  Creatinine 0.50 - 0.99 mg/dL 0.87 0.92 1.09(H) - 0.92   BP/Weight 12/08/2017 12/05/2017 07/21/2017 04/25/2017 11/24/2016 11/11/2016 05/16/310  Systolic BP 811 - 886 773 736 681 -  Diastolic BP 80 - 80 82 70 80 -  Wt. (Lbs) 293 292 278 276.4 272 272 280  BMI 45.89 45.73 42.27 42.03 41.36 41.36 42.57   Foot/eye exam completion dates Latest Ref Rng & Units 12/08/2017 09/27/2017  Eye Exam No Retinopathy - No Retinopathy  Foot Form Completion - Done -

## 2017-12-08 NOTE — Patient Instructions (Addendum)
F/u in 4 month, call if you need me before  Return 12/06 for shingrix #2 , between 8 and 11: 30  We will add thyroid lab to recent test and contact you with the result   Need to reduce starch and stop sweets, also mertformin is increased to 3 tablets daily   Fasting lipid, cmp and EGFr, hBA1C, TSH, CBC, vit D 1 week before  Next visit  Thanks for choosing [facility], we consider it a privelige to serve you.  Oxycodone is being prescribed as in the past, please increase gabapentin to two daily because of increased pain

## 2017-12-08 NOTE — Assessment & Plan Note (Signed)
Increased and uncontrolled pain with weight gain Will work on weight loss Will increase to gabapentin twice daily Pain regisry reviewed and patient to continue prior dosing of oxycodone when pain is severe, medication is sent in

## 2017-12-08 NOTE — Progress Notes (Signed)
Fill Date ID Written Drug Qty Days Prescriber Rx # Pharmacy Refill Daily Dose * Pymt Type PMP  05/18/2017  1   05/18/2017  Oxycodon-Acetaminophen 7.5-325  10.00 10 Ma Sim  [id number]  [facility] (0019)  0/0 11.25 MME Comm Ins  [place]  11/24/2016  1   11/24/2016  Oxycodone-Acetaminophen 5-325  30.00 90 Ma Sim  [id number]  [facility] (0019)  0/0 2.50 MME Comm Ins  [place]  07/20/2016  1   07/20/2016  Oxycodone-Acetaminophen 5-325  90.00 90 Ma Sim  [id number]  [facility] (0019)  0/0 7.50 MME Medicare    03/24/2016  1   03/24/2016  Oxycodone-Acetaminophen 5-325  30.00 90 Ma Sim  [id number]  [facility] (0019)  0/0

## 2017-12-08 NOTE — Progress Notes (Signed)
[name]     MRN: [id number]      DOB: Mar 01, 1955  HPI: Patient is in for annual physical exam. C/o increased and uncontrolled back pain with weight gain, surgery is not an option and she has had bad results with epidurals, states gabapentin does help but only taking one not 3/day, will work on weight loss and increase gabapentin to two daily. Requests limited oxycodone as she has used in the past when in severe pain, registry is reviewed and medication given as before. 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 Education is also done re dietary change  Immunization is reviewed , and  updated    PE: BP 112/80   Pulse 93   Resp 16   Ht 5\' 7"  (1.702 m)   Wt 293 lb (132.9 kg)   SpO2 97%   BMI 45.89 kg/m   Pleasant  female, alert and oriented x 3, in no cardio-pulmonary distress. Afebrile. HEENT No facial trauma or asymetry. Sinuses non tender.  Extra occullar muscles intact,  External ears normal, tympanic membranes clear. Oropharynx moist, no exudate. Neck: supple, no adenopathy,JVD or thyromegaly.No bruits.  Chest: Clear to ascultation bilaterally.No crackles or wheezes. Non tender to palpation  Breast: No asymetry,no masses or lumps. No tenderness. No nipple discharge or inversion. No axillary or supraclavicular adenopathy  Cardiovascular system; Heart sounds normal,  S1 and  S2 ,no S3.  No murmur, or thrill. Apical beat not displaced Peripheral pulses normal.  Abdomen: Soft, non tender, no organomegaly or masses. No bruits. Bowel sounds normal. No guarding, tenderness or  rebound.     Musculoskeletal exam: Decreased  ROM of lumbar spine, right hip ,  and knee.  Deformity ,swelling and  crepitus noted.in knees No muscle wasting or atrophy.   Neurologic: Cranial nerves 2 to 12 intact. Grade 4 power in RLE with reduced tone and sensation, otherwise normal  No tremor.  Skin: Intact, no ulceration, erythema , scaling or rash noted. Pigmentation normal throughout  Psych; Normal mood and affect. Judgement and concentration normal   Assessment & Plan:  Annual physical exam Annual exam as documented. Counseling done  re healthy lifestyle involving commitment to 150 minutes exercise per week, heart healthy diet, and attaining healthy weight.The importance of adequate sleep also discussed. Immunization and cancer screening needs are specifically addressed at this visit.   Back pain with radiculopathy Increased and uncontrolled pain with weight gain Will work on weight loss Will increase to gabapentin twice daily Pain regisry reviewed and patient to continue prior dosing of oxycodone when pain is severe, medication is sent in  Type 1 diabetes mellitus with diabetic neuropathy, unspecified (HCC) Deteriorated, increase metformin to 3 daily, and change eating habits Ms. Constantin is reminded of the importance of commitment to daily physical activity for 30 minutes or more, as able and the need to limit carbohydrate intake to 30 to 60 grams per meal to help with blood sugar control.   The need to take medication as prescribed, test blood sugar as directed, and to call between visits if there is a concern that blood sugar  is uncontrolled is also discussed.   Ms. Croghan is reminded of the importance of daily foot exam, annual eye examination, and good blood sugar, blood pressure and cholesterol control.  Diabetic Labs Latest Ref Rng & Units 12/05/2017 07/21/2017 04/21/2017 11/25/2016 11/22/2016  HbA1c <5.7 % of total Hgb 7.3(H) - 6.1(H) - 6.4(H)  Microalbumin  Not Estab. ug/mL - - - <3.0(H) -  Micro/Creat Ratio 0.0 - 30.0 mg/g creat - - - <5.8 -  Chol <200 mg/dL 234(H) 158 239(H) - 149  HDL >50 mg/dL 45(L) 54 51 - 64  Calc LDL mg/dL (calc) 157(H) 82 156(H) - 69  Triglycerides <150 mg/dL 186(H) 127 182(H) - 81  Creatinine 0.50 - 0.99 mg/dL 0.87 0.92 1.09(H) - 0.92   BP/Weight 12/08/2017 12/05/2017 07/21/2017 04/25/2017 11/24/2016 11/11/2016 2/68/3419  Systolic BP 622 - 297 989 211 941 -  Diastolic BP 80 - 80 82 70 80 -  Wt. (Lbs) 293 292 278 276.4 272 272 280  BMI 45.89 45.73 42.27 42.03 41.36 41.36 42.57   Foot/eye exam completion dates Latest Ref Rng & Units 12/08/2017 09/27/2017  Eye Exam No Retinopathy - No Retinopathy  Foot Form Completion - Done -        Morbid obesity Deteriorated. Patient re-educated about  the importance of commitment to a  minimum of 150 minutes of exercise per week.  The importance of healthy food choices with portion control discussed. Encouraged to start a food diary, count calories and to consider  joining a support group. Sample diet sheets offered. Goals set by the patient for the next several months.   Weight /BMI 12/08/2017 12/05/2017 07/21/2017  WEIGHT 293 lb 292 lb 278 lb  HEIGHT 5\' 7"  5\' 7"  5\' 8"   BMI 45.89 kg/m2 45.73 kg/m2 42.27 kg/m2      Need for immunization against influenza After obtaining informed consent, the vaccine is  administered .

## 2017-12-08 NOTE — Assessment & Plan Note (Signed)
Deteriorated. Patient re-educated about  the importance of commitment to a  minimum of 150 minutes of exercise per week.  The importance of healthy food choices with portion control discussed. Encouraged to start a food diary, count calories and to consider  joining a support group. Sample diet sheets offered. Goals set by the patient for the next several months.   Weight /BMI 12/08/2017 12/05/2017 07/21/2017  WEIGHT 293 lb 292 lb 278 lb  HEIGHT 5\' 7"  5\' 7"  5\' 8"   BMI 45.89 kg/m2 45.73 kg/m2 42.27 kg/m2

## 2017-12-08 NOTE — Assessment & Plan Note (Signed)
After obtaining informed consent, the vaccine is  administered 

## 2017-12-08 NOTE — Assessment & Plan Note (Signed)
Annual exam as documented. Counseling done  re healthy lifestyle involving commitment to 150 minutes exercise per week, heart healthy diet, and attaining healthy weight.The importance of adequate sleep also discussed.  Immunization and cancer screening needs are specifically addressed at this visit.  

## 2017-12-09 LAB — COMPLETE METABOLIC PANEL WITH GFR
AG Ratio: 1.5 (calc) (ref 1.0–2.5)
ALBUMIN MSPROF: 4 g/dL (ref 3.6–5.1)
ALKALINE PHOSPHATASE (APISO): 79 U/L (ref 33–130)
ALT: 16 U/L (ref 6–29)
AST: 18 U/L (ref 10–35)
BUN: 11 mg/dL (ref 7–25)
CALCIUM: 9.4 mg/dL (ref 8.6–10.4)
CO2: 30 mmol/L (ref 20–32)
CREATININE: 0.87 mg/dL (ref 0.50–0.99)
Chloride: 103 mmol/L (ref 98–110)
GFR, EST NON AFRICAN AMERICAN: 71 mL/min/{1.73_m2} (ref >=60)
GFR, Est African American: 83 mL/min/{1.73_m2} (ref >=60)
GLOBULIN: 2.7 g/dL (ref 1.9–3.7)
Glucose, Bld: 121 mg/dL — ABNORMAL HIGH (ref 65–99)
Potassium: 4.3 mmol/L (ref 3.5–5.3)
SODIUM: 140 mmol/L (ref 135–146)
Total Bilirubin: 0.5 mg/dL (ref 0.2–1.2)
Total Protein: 6.7 g/dL (ref 6.1–8.1)

## 2017-12-09 LAB — HEMOGLOBIN A1C
EAG (MMOL/L): 9 (calc)
Hgb A1c MFr Bld: 7.3 % of total Hgb — ABNORMAL HIGH (ref <5.7)
Mean Plasma Glucose: 163 (calc)

## 2017-12-09 LAB — LIPID PANEL
CHOL/HDL RATIO: 5.2 (calc) — AB (ref <5.0)
Cholesterol: 234 mg/dL — ABNORMAL HIGH (ref <200)
HDL: 45 mg/dL — ABNORMAL LOW (ref >50)
LDL Cholesterol (Calc): 157 mg/dL (calc) — ABNORMAL HIGH
NON-HDL CHOLESTEROL (CALC): 189 mg/dL — AB (ref <130)
Triglycerides: 186 mg/dL — ABNORMAL HIGH (ref <150)

## 2017-12-09 LAB — TSH: TSH: 1.19 mIU/L (ref 0.40–4.50)

## 2017-12-15 ENCOUNTER — Other Ambulatory Visit: Payer: Self-pay | Admitting: Family Medicine

## 2017-12-19 ENCOUNTER — Ambulatory Visit (AMBULATORY_SURGERY_CENTER): Payer: Medicare Other | Admitting: Gastroenterology

## 2017-12-19 ENCOUNTER — Encounter: Payer: Self-pay | Admitting: *Deleted

## 2017-12-19 ENCOUNTER — Encounter: Payer: Self-pay | Admitting: Gastroenterology

## 2017-12-19 VITALS — BP 116/77 | HR 67 | Temp 97.5°F | Resp 14 | Ht 67.0 in | Wt 293.0 lb

## 2017-12-19 DIAGNOSIS — Z1211 Encounter for screening for malignant neoplasm of colon: Secondary | ICD-10-CM | POA: Diagnosis not present

## 2017-12-19 DIAGNOSIS — D122 Benign neoplasm of ascending colon: Secondary | ICD-10-CM

## 2017-12-19 MED ORDER — SODIUM CHLORIDE 0.9 % IV SOLN: 500.0000 mL | Freq: Once | INTRAVENOUS | Status: DC

## 2017-12-19 NOTE — Patient Instructions (Signed)
Handouts provided:  Hemorrhoids and Polyps  YOU HAD AN ENDOSCOPIC PROCEDURE TODAY AT [facility]:   Refer to the procedure report that was given to you for any specific questions about what was found during the examination.  If the procedure report does not answer your questions, please call your gastroenterologist to clarify.  If you requested that your care partner not be given the details of your procedure findings, then the procedure report has been included in a sealed envelope for you to review at your convenience later.  YOU SHOULD EXPECT: Some feelings of bloating in the abdomen. Passage of more gas than usual.  Walking can help get rid of the air that was put into your GI tract during the procedure and reduce the bloating. If you had a lower endoscopy (such as a colonoscopy or flexible sigmoidoscopy) you may notice spotting of blood in your stool or on the toilet paper. If you underwent a bowel prep for your procedure, you may not have a normal bowel movement for a few days.  Please Note:  You might notice some irritation and congestion in your nose or some drainage.  This is from the oxygen used during your procedure.  There is no need for concern and it should clear up in a day or so.  SYMPTOMS TO REPORT IMMEDIATELY:   Following lower endoscopy (colonoscopy or flexible sigmoidoscopy):  Excessive amounts of blood in the stool  Significant tenderness or worsening of abdominal pains  Swelling of the abdomen that is new, acute  Fever of 100F or higher  For urgent or emergent issues, a gastroenterologist can be reached at any hour by calling [phone].   DIET:  We do recommend a small meal at first, but then you may proceed to your regular diet.  Drink plenty of fluids but you should avoid alcoholic beverages for 24 hours.  ACTIVITY:  You should plan to take it easy for the rest of today and you should NOT DRIVE or use heavy machinery until tomorrow (because of the  sedation medicines used during the test).    FOLLOW UP: Our staff will call the number listed on your records the next business day following your procedure to check on you and address any questions or concerns that you may have regarding the information given to you following your procedure. If we do not reach you, we will leave a message.  However, if you are feeling well and you are not experiencing any problems, there is no need to return our call.  We will assume that you have returned to your regular daily activities without incident.  If any biopsies were taken you will be contacted by phone or by letter within the next 1-3 weeks.  Please call us at [phone] if you have not heard about the biopsies in 3 weeks.    SIGNATURES/CONFIDENTIALITY: You and/or your care partner have signed paperwork which will be entered into your electronic medical record.  These signatures attest to the fact that that the information above on your After Visit Summary has been reviewed and is understood.  Full responsibility of the confidentiality of this discharge information lies with you and/or your care-partner.

## 2017-12-19 NOTE — Progress Notes (Signed)
PT taken to PACU. Monitors in place. VSS. Report given to RN. 

## 2017-12-19 NOTE — Op Note (Signed)
[facility] Patient Name: [name] Procedure Date: 12/19/2017 9:28 AM MRN: [id number] Endoscopist: Ladene Artist , MD Age: 62 Referring MD:  Date of Birth: August 21, 1955 Gender: Female Account #: [id number] Procedure:                Colonoscopy Indications:              Screening for colorectal malignant neoplasm Medicines:                Monitored Anesthesia Care Procedure:                Pre-Anesthesia Assessment:                           - Prior to the procedure, a History and Physical                            was performed, and patient medications and                            allergies were reviewed. The patient's tolerance of                            previous anesthesia was also reviewed. The risks                            and benefits of the procedure and the sedation                            options and risks were discussed with the patient.                            All questions were answered, and informed consent                            was obtained. Prior Anticoagulants: The patient has                            taken no previous anticoagulant or antiplatelet                            agents. ASA Grade Assessment: II - A patient with                            mild systemic disease. After reviewing the risks                            and benefits, the patient was deemed in                            satisfactory condition to undergo the procedure.                           After obtaining informed consent, the colonoscope  was passed under direct vision. Throughout the                            procedure, the patient's blood pressure, pulse, and                            oxygen saturations were monitored continuously. The                            Colonoscope was introduced through the anus and                            advanced to the the cecum, identified by                            appendiceal orifice and  ileocecal valve. The                            ileocecal valve, appendiceal orifice, and rectum                            were photographed. The quality of the bowel                            preparation was good. The colonoscopy was performed                            without difficulty. The patient tolerated the                            procedure well. Scope In: 9:45:32 AM Scope Out: 10:01:04 AM Scope Withdrawal Time: 0 hours 13 minutes 15 seconds  Total Procedure Duration: 0 hours 15 minutes 32 seconds  Findings:                 The perianal and digital rectal examinations were                            normal.                           A 6 mm polyp was found in the ascending colon. The                            polyp was sessile. The polyp was removed with a                            cold snare. Resection and retrieval were complete.                           Internal hemorrhoids were found during                            retroflexion. The hemorrhoids were small and Grade  I (internal hemorrhoids that do not prolapse).                           The exam was otherwise without abnormality on                            direct and retroflexion views. Complications:            No immediate complications. Estimated blood loss:                            None. Estimated Blood Loss:     Estimated blood loss: none. Impression:               - One 6 mm polyp in the ascending colon, removed                            with a cold snare. Resected and retrieved.                           - Internal hemorrhoids.                           - The examination was otherwise normal on direct                            and retroflexion views. Recommendation:           - Repeat colonoscopy in 5 years for surveillance if                            polyp is precancerous, otherwise 10 years.                           - Patient has a contact number available for                             emergencies. The signs and symptoms of potential                            delayed complications were discussed with the                            patient. Return to normal activities tomorrow.                            Written discharge instructions were provided to the                            patient.                           - Resume previous diet.                           - Continue present medications.                           -  Await pathology results. Ladene Artist, MD 12/19/2017 10:03:45 AM This report has been signed electronically.

## 2017-12-20 ENCOUNTER — Telehealth: Payer: Self-pay | Admitting: *Deleted

## 2017-12-20 NOTE — Telephone Encounter (Signed)
  Follow up Call-  Call back number 12/19/2017  Post procedure Call Back phone  # [phone]  Some recent data might be hidden     Patient questions:  Do you have a fever, pain , or abdominal swelling? No. Pain Score  0 *  Have you tolerated food without any problems? Yes.    Have you been able to return to your normal activities? Yes.    Do you have any questions about your discharge instructions: Diet   No. Medications  No. Follow up visit  No.  Do you have questions or concerns about your Care? No.  Actions: * If pain score is 4 or above: No action needed, pain <4.

## 2017-12-28 ENCOUNTER — Encounter: Payer: Self-pay | Admitting: Family Medicine

## 2017-12-28 ENCOUNTER — Ambulatory Visit (INDEPENDENT_AMBULATORY_CARE_PROVIDER_SITE_OTHER): Payer: Medicare Other | Admitting: Family Medicine

## 2017-12-28 VITALS — BP 138/85 | HR 90 | Resp 12 | Ht 68.0 in | Wt 297.1 lb

## 2017-12-28 DIAGNOSIS — J209 Acute bronchitis, unspecified: Secondary | ICD-10-CM

## 2017-12-28 DIAGNOSIS — I10 Essential (primary) hypertension: Secondary | ICD-10-CM | POA: Diagnosis not present

## 2017-12-28 DIAGNOSIS — J0100 Acute maxillary sinusitis, unspecified: Secondary | ICD-10-CM | POA: Diagnosis not present

## 2017-12-28 MED ORDER — FLUCONAZOLE 150 MG PO TABS: 150.0000 mg | ORAL_TABLET | Freq: Once | ORAL | 0 refills | Status: AC

## 2017-12-28 MED ORDER — SULFAMETHOXAZOLE-TRIMETHOPRIM 800-160 MG PO TABS: 1.0000 | ORAL_TABLET | Freq: Two times a day (BID) | ORAL | 0 refills | Status: DC

## 2017-12-28 MED ORDER — BENZONATATE 100 MG PO CAPS: 100.0000 mg | ORAL_CAPSULE | Freq: Two times a day (BID) | ORAL | 0 refills | Status: DC | PRN

## 2017-12-28 MED ORDER — PROMETHAZINE-DM 6.25-15 MG/5ML PO SYRP: ORAL_SOLUTION | ORAL | 0 refills | Status: DC

## 2017-12-28 NOTE — Assessment & Plan Note (Addendum)
1 week history progressive symptoms, antibiotic , decongestant, cough suppressant and anti fungal prescribed

## 2017-12-28 NOTE — Progress Notes (Signed)
   [name]     MRN: [id number]      DOB: 05-16-1955   HPI [name]  1 week h/o worsening head and chest congestion, associated with fever and chills intermittently. Nasal drainage has thickened , and is yellowish green, and at times bloody. Sputum is thick and yellow. Denies bilateral ear pressure, c/o sore throat. Increasing fatigue , poor appetitie and sleep disturbed by cough. No improvement with OTC medication.  ROS  Denies chest pains, palpitations and leg swelling Denies abdominal pain, nausea, vomiting,diarrhea or constipation.   Denies dysuria, frequency, hesitancy or incontinence. Denies joint pain, swelling and limitation in mobility. Denies headaches, seizures, numbness, or tingling. Denies depression, anxiety or insomnia. Denies skin break down or rash.   PE  BP 138/85 (BP Location: Right Arm, Patient Position: Sitting)   Pulse 90   Resp 12   Ht 5\' 8"  (1.727 m)   Wt 297 lb 1.3 oz (134.8 kg)   SpO2 96% Comment: room air  BMI 45.17 kg/m   Patient alert and oriented and in no cardiopulmonary distress.Ill appearinf  HEENT: No facial asymmetry, EOMI,   oropharynx pink and moist.  Neck supple no JVD, no mass.Maxillary sinus tenderness, TM clear , anterior cervical adenopathy  Chest: Adequate air entry, scattered crackles and few wheezes  CVS: S1, S2 no murmurs, no S3.Regular rate.  ABD: Soft non tender.   Ext: No edema  MS: Adequate though reduced  ROM spine, shoulders, hips and knees.  Skin: Intact, no ulcerations or rash noted.  Psych: Good eye contact, normal affect. Memory intact not anxious or depressed appearing.  CNS: CN 2-12 intact, power,  normal throughout.no focal deficits noted.   Assessment & Plan  Acute sinusitis 1 week history  Of progressive symptoms, antibiotic and saline flushes  Acute bronchitis 1 week history progressive symptoms, antibiotic , decongestant, cough suppressant and anti fungal prescribed  Hypertension goal  BP (blood pressure) < 130/80 Controlled, no change in medication DASH diet and commitment to daily physical activity for a minimum of 30 minutes discussed and encouraged, as a part of hypertension management. The importance of attaining a healthy weight is also discussed.  BP/Weight 12/28/2017 12/19/2017 12/08/2017 12/05/2017 07/21/2017 04/25/2017 59/16/3846  Systolic BP 659 935 701 - 779 390 300  Diastolic BP 85 77 80 - 80 82 70  Wt. (Lbs) 297.08 293 293 292 278 276.4 272  BMI 45.17 45.89 45.89 45.73 42.27 42.03 41.36       Morbid obesity Deteriorated. Patient re-educated about  the importance of commitment to a  minimum of 150 minutes of exercise per week.  The importance of healthy food choices with portion control discussed. Encouraged to start a food diary, count calories and to consider  joining a support group. Sample diet sheets offered. Goals set by the patient for the next several months.   Weight /BMI 12/28/2017 12/19/2017 12/08/2017  WEIGHT 297 lb 1.3 oz 293 lb 293 lb  HEIGHT 5\' 8"  5\' 7"  5\' 7"   BMI 45.17 kg/m2 45.89 kg/m2 45.89 kg/m2

## 2017-12-28 NOTE — Assessment & Plan Note (Addendum)
1 week history  Of progressive symptoms, antibiotic and saline flushes

## 2017-12-28 NOTE — Patient Instructions (Signed)
F/u as before, call if you need me before  You are treated for acute sinusitis, and bronchitis, 4 medications are prescribed   All the best for the rest of  the year

## 2018-01-01 NOTE — Assessment & Plan Note (Signed)
Deteriorated. Patient re-educated about  the importance of commitment to a  minimum of 150 minutes of exercise per week.  The importance of healthy food choices with portion control discussed. Encouraged to start a food diary, count calories and to consider  joining a support group. Sample diet sheets offered. Goals set by the patient for the next several months.   Weight /BMI 12/28/2017 12/19/2017 12/08/2017  WEIGHT 297 lb 1.3 oz 293 lb 293 lb  HEIGHT 5\' 8"  5\' 7"  5\' 7"   BMI 45.17 kg/m2 45.89 kg/m2 45.89 kg/m2

## 2018-01-01 NOTE — Assessment & Plan Note (Signed)
Controlled, no change in medication DASH diet and commitment to daily physical activity for a minimum of 30 minutes discussed and encouraged, as a part of hypertension management. The importance of attaining a healthy weight is also discussed.  BP/Weight 12/28/2017 12/19/2017 12/08/2017 12/05/2017 07/21/2017 04/25/2017 35/68/6168  Systolic BP 372 902 111 - 552 080 223  Diastolic BP 85 77 80 - 80 82 70  Wt. (Lbs) 297.08 293 293 292 278 276.4 272  BMI 45.17 45.89 45.89 45.73 42.27 42.03 41.36

## 2018-01-02 ENCOUNTER — Encounter: Payer: Self-pay | Admitting: Gastroenterology

## 2018-01-05 ENCOUNTER — Ambulatory Visit: Payer: Medicare Other

## 2018-01-05 VITALS — BP 122/82 | HR 84 | Resp 12 | Ht 68.0 in | Wt 290.0 lb

## 2018-01-05 DIAGNOSIS — Z0000 Encounter for general adult medical examination without abnormal findings: Secondary | ICD-10-CM

## 2018-01-05 NOTE — Patient Instructions (Signed)
[name] , Thank you for taking time to come for your Medicare Wellness Visit. I appreciate your ongoing commitment to your health goals. Please review the following plan we discussed and let me know if I can assist you in the future.   Screening recommendations/referrals: Colonoscopy: up to date  Mammogram: up to date  Bone Density: up to date  Recommended yearly ophthalmology/optometry visit for glaucoma screening and checkup Recommended yearly dental visit for hygiene and checkup  Vaccinations: Influenza vaccine: up to date  Pneumococcal vaccine: up to date  Tdap vaccine: up to date  Shingles vaccine: up to date     Advanced directives: information given   Conditions/risks identified: Diabetes, hypertension  Next appointment: Wellness visit in one year   Preventive Care 40-64 Years, Female Preventive care refers to lifestyle choices and visits with your health care provider that can promote health and wellness. What does preventive care include?  A yearly physical exam. This is also called an annual well check.  Dental exams once or twice a year.  Routine eye exams. Ask your health care provider how often you should have your eyes checked.  Personal lifestyle choices, including:  Daily care of your teeth and gums.  Regular physical activity.  Eating a healthy diet.  Avoiding tobacco and drug use.  Limiting alcohol use.  Practicing safe sex.  Taking low-dose aspirin daily starting at age 71.  Taking vitamin and mineral supplements as recommended by your health care provider. What happens during an annual well check? The services and screenings done by your health care provider during your annual well check will depend on your age, overall health, lifestyle risk factors, and family history of disease. Counseling  Your health care provider may ask you questions about your:  Alcohol use.  Tobacco use.  Drug use.  Emotional well-being.  Home and  relationship well-being.  Sexual activity.  Eating habits.  Work and work Statistician.  Method of birth control.  Menstrual cycle.  Pregnancy history. Screening  You may have the following tests or measurements:  Height, weight, and BMI.  Blood pressure.  Lipid and cholesterol levels. These may be checked every 5 years, or more frequently if you are over 79 years old.  Skin check.  Lung cancer screening. You may have this screening every year starting at age 31 if you have a 30-pack-year history of smoking and currently smoke or have quit within the past 15 years.  Fecal occult blood test (FOBT) of the stool. You may have this test every year starting at age 68.  Flexible sigmoidoscopy or colonoscopy. You may have a sigmoidoscopy every 5 years or a colonoscopy every 10 years starting at age 32.  Hepatitis C blood test.  Hepatitis B blood test.  Sexually transmitted disease (STD) testing.  Diabetes screening. This is done by checking your blood sugar (glucose) after you have not eaten for a while (fasting). You may have this done every 1-3 years.  Mammogram. This may be done every 1-2 years. Talk to your health care provider about when you should start having regular mammograms. This may depend on whether you have a family history of breast cancer.  BRCA-related cancer screening. This may be done if you have a family history of breast, ovarian, tubal, or peritoneal cancers.  Pelvic exam and Pap test. This may be done every 3 years starting at age 16. Starting at age 21, this may be done every 5 years if you have a Pap test in  combination with an HPV test.  Bone density scan. This is done to screen for osteoporosis. You may have this scan if you are at high risk for osteoporosis. Discuss your test results, treatment options, and if necessary, the need for more tests with your health care provider. Vaccines  Your health care provider may recommend certain vaccines, such  as:  Influenza vaccine. This is recommended every year.  Tetanus, diphtheria, and acellular pertussis (Tdap, Td) vaccine. You may need a Td booster every 10 years.  Zoster vaccine. You may need this after age 14.  Pneumococcal 13-valent conjugate (PCV13) vaccine. You may need this if you have certain conditions and were not previously vaccinated.  Pneumococcal polysaccharide (PPSV23) vaccine. You may need one or two doses if you smoke cigarettes or if you have certain conditions. Talk to your health care provider about which screenings and vaccines you need and how often you need them. This information is not intended to replace advice given to you by your health care provider. Make sure you discuss any questions you have with your health care provider. Document Released: 02/07/2015 Document Revised: 10/01/2015 Document Reviewed: 11/12/2014 Elsevier Interactive Patient Education  2017 [place] Prevention in the Home Falls can cause injuries. They can happen to people of all ages. There are many things you can do to make your home safe and to help prevent falls. What can I do on the outside of my home?  Regularly fix the edges of walkways and driveways and fix any cracks.  Remove anything that might make you trip as you walk through a door, such as a raised step or threshold.  Trim any bushes or trees on the path to your home.  Use bright outdoor lighting.  Clear any walking paths of anything that might make someone trip, such as rocks or tools.  Regularly check to see if handrails are loose or broken. Make sure that both sides of any steps have handrails.  Any raised decks and porches should have guardrails on the edges.  Have any leaves, snow, or ice cleared regularly.  Use sand or salt on walking paths during winter.  Clean up any spills in your garage right away. This includes oil or grease spills. What can I do in the bathroom?  Use night  lights.  Install grab bars by the toilet and in the tub and shower. Do not use towel bars as grab bars.  Use non-skid mats or decals in the tub or shower.  If you need to sit down in the shower, use a plastic, non-slip stool.  Keep the floor dry. Clean up any water that spills on the floor as soon as it happens.  Remove soap buildup in the tub or shower regularly.  Attach bath mats securely with double-sided non-slip rug tape.  Do not have throw rugs and other things on the floor that can make you trip. What can I do in the bedroom?  Use night lights.  Make sure that you have a light by your bed that is easy to reach.  Do not use any sheets or blankets that are too big for your bed. They should not hang down onto the floor.  Have a firm chair that has side arms. You can use this for support while you get dressed.  Do not have throw rugs and other things on the floor that can make you trip. What can I do in the kitchen?  Clean up any spills  right away.  Avoid walking on wet floors.  Keep items that you use a lot in easy-to-reach places.  If you need to reach something above you, use a strong step stool that has a grab bar.  Keep electrical cords out of the way.  Do not use floor polish or wax that makes floors slippery. If you must use wax, use non-skid floor wax.  Do not have throw rugs and other things on the floor that can make you trip. What can I do with my stairs?  Do not leave any items on the stairs.  Make sure that there are handrails on both sides of the stairs and use them. Fix handrails that are broken or loose. Make sure that handrails are as long as the stairways.  Check any carpeting to make sure that it is firmly attached to the stairs. Fix any carpet that is loose or worn.  Avoid having throw rugs at the top or bottom of the stairs. If you do have throw rugs, attach them to the floor with carpet tape.  Make sure that you have a light switch at the  top of the stairs and the bottom of the stairs. If you do not have them, ask someone to add them for you. What else can I do to help prevent falls?  Wear shoes that:  Do not have high heels.  Have rubber bottoms.  Are comfortable and fit you well.  Are closed at the toe. Do not wear sandals.  If you use a stepladder:  Make sure that it is fully opened. Do not climb a closed stepladder.  Make sure that both sides of the stepladder are locked into place.  Ask someone to hold it for you, if possible.  Clearly mark and make sure that you can see:  Any grab bars or handrails.  First and last steps.  Where the edge of each step is.  Use tools that help you move around (mobility aids) if they are needed. These include:  Canes.  Walkers.  Scooters.  Crutches.  Turn on the lights when you go into a dark area. Replace any light bulbs as soon as they burn out.  Set up your furniture so you have a clear path. Avoid moving your furniture around.  If any of your floors are uneven, fix them.  If there are any pets around you, be aware of where they are.  Review your medicines with your doctor. Some medicines can make you feel dizzy. This can increase your chance of falling. Ask your doctor what other things that you can do to help prevent falls. This information is not intended to replace advice given to you by your health care provider. Make sure you discuss any questions you have with your health care provider. Document Released: 11/07/2008 Document Revised: 06/19/2015 Document Reviewed: 02/15/2014 Elsevier Interactive Patient Education  2017 [employer].

## 2018-01-05 NOTE — Progress Notes (Signed)
Subjective:   RYENNE LYNAM is a 62 y.o. female who presents for Medicare Annual (Subsequent) preventive examination.  Review of Systems:   Cardiac Risk Factors include: diabetes mellitus;hypertension;dyslipidemia;obesity (BMI >30kg/m2)     Objective:     Vitals: BP 122/82   Pulse 84   Resp 12   Ht 5\' 8"  (1.727 m)   Wt 290 lb (131.5 kg)   SpO2 97%   BMI 44.09 kg/m   Body mass index is 44.09 kg/m.  Advanced Directives 01/05/2018 08/11/2016  Does Patient Have a Medical Advance Directive? No No  Would patient like information on creating a medical advance directive? No - Patient declined Yes (ED - Information included in AVS)    Tobacco Social History   Tobacco Use  Smoking Status Never Smoker  Smokeless Tobacco Never Used     Counseling given: Not Answered   Clinical Intake:  Pre-visit preparation completed: Yes  Pain : No/denies pain Pain Score: 0-No pain     BMI - recorded: 44.09 Nutritional Status: BMI > 30  Obese Diabetes: Yes CBG done?: No Did pt. bring in CBG monitor from home?: No  How often do you need to have someone help you when you read instructions, pamphlets, or other written materials from your doctor or pharmacy?: 1 - Never What is the last grade level you completed in school?: 12 grade   Interpreter Needed?: No  Information entered by :: Loretha Stapler LPN   Past Medical History:  Diagnosis Date  . Allergy   . Anemia   . Arthritis   . Cataract    forming  . Diabetes mellitus without complication ([place]) 8099  . GERD (gastroesophageal reflux disease)   . Hyperlipidemia   . Hypertension 2011  . Morbid obesity ([place])   . Neuromuscular disorder ([place])    issues with nerves in back   . Osteopenia   . Thyroid disease    Past Surgical History:  Procedure Laterality Date  . ABDOMINAL HYSTERECTOMY    . APPENDECTOMY    . CHOLECYSTECTOMY    . COLONOSCOPY  2009   [facility] - normal per pt   . TONSILLECTOMY     Family History    Problem Relation Age of Onset  . Asthma Mother   . Hypertension Mother   . Heart disease Mother 77       cabg  . Diabetes Father   . Hypertension Father   . Heart disease Father 32       cabg  . Cancer Sister        breast, uterine  . Cancer Maternal Grandmother   . Cancer Paternal Grandfather   . Cancer Sister   . Prostate cancer Paternal Grandmother   . Colon polyps Neg Hx   . Colon cancer Neg Hx   . Esophageal cancer Neg Hx   . Rectal cancer Neg Hx   . Stomach cancer Neg Hx    Social History   Socioeconomic History  . Marital status: Married    Spouse name: Barnie Alderman   . Number of children: 2  . Years of education: 14  . Highest education level: 12th grade  Occupational History  . Occupation: disabled   Social Needs  . Financial resource strain: Not very hard  . Food insecurity:    Worry: Never true    Inability: Never true  . Transportation needs:    Medical: No    Non-medical: No  Tobacco Use  . Smoking status: Never  Smoker  . Smokeless tobacco: Never Used  Substance and Sexual Activity  . Alcohol use: No  . Drug use: No  . Sexual activity: Not Currently  Lifestyle  . Physical activity:    Days per week: 1 day    Minutes per session: 30 min  . Stress: Only a little  Relationships  . Social connections:    Talks on phone: More than three times a week    Gets together: More than three times a week    Attends religious service: More than 4 times per year    Active member of club or organization: No    Attends meetings of clubs or organizations: Never    Relationship status: Married  Other Topics Concern  . Not on file  Social History Narrative   Lives with Husband alone     Outpatient Encounter Medications as of 01/05/2018  Medication Sig  . aspirin 81 MG tablet Take 81 mg by mouth daily.    Marland Kitchen atorvastatin (LIPITOR) 40 MG tablet Take 1 tablet (40 mg total) by mouth daily.  . benazepril (LOTENSIN) 5 MG tablet TAKE TWO (2) TABLETS BY MOUTH EVERY DAY   . benzonatate (TESSALON) 100 MG capsule Take 1 capsule (100 mg total) by mouth 2 (two) times daily as needed for cough.  . calcium-vitamin D (OSCAL WITH D) 500-200 MG-UNIT per tablet Take 1 tablet by mouth daily.  . cyclobenzaprine (FLEXERIL) 5 MG tablet TAKE ONE TABLET BY MOUTH AT BEDTIME AS NEEDED. SHOULD LAST SIX MONTHS.  Marland Kitchen Dexlansoprazole (DEXILANT) 30 MG capsule Take 30 mg by mouth daily.  Marland Kitchen gabapentin (NEURONTIN) 300 MG capsule TAKE ONE CAPSULE BY MOUTH THREE TIMES DAILY.  Marland Kitchen ibuprofen (ADVIL,MOTRIN) 800 MG tablet TAKE ONE TABLET BY MOUTH EVERY 8 HOURS AS NEEDED.  Marland Kitchen levothyroxine (SYNTHROID, LEVOTHROID) 150 MCG tablet Take 1 tablet (150 mcg total) by mouth daily.  . metFORMIN (GLUCOPHAGE) 500 MG tablet One tablet three times daily  . oxyCODONE-acetaminophen (PERCOCET) 7.5-325 MG tablet One tablet every 8 hours as needed, for severe back and leg pain  . promethazine-dextromethorphan (PROMETHAZINE-DM) 6.25-15 MG/5ML syrup One teaspoon at bedtime , as needed, for excessive cough  . sulfamethoxazole-trimethoprim (BACTRIM DS,SEPTRA DS) 800-160 MG tablet Take 1 tablet by mouth 2 (two) times daily.  . traZODone (DESYREL) 100 MG tablet Take 1 tablet (100 mg total) by mouth at bedtime.  . traZODone (DESYREL) 100 MG tablet TAKE ONE TABLET BY MOUTH AT BEDTIME.   Facility-Administered Encounter Medications as of 01/05/2018  Medication  . Influenza (>/= 3 years) inactive virus vaccine (FLVIRIN/FLUZONE) injection SUSP 0.5 mL    Activities of Daily Living In your present state of health, do you have any difficulty performing the following activities: 01/05/2018  Hearing? N  Vision? N  Difficulty concentrating or making decisions? N  Walking or climbing stairs? Y  Dressing or bathing? N  Doing errands, shopping? N  Preparing Food and eating ? N  Using the Toilet? N  In the past six months, have you accidently leaked urine? N  Do you have problems with loss of bowel control? N  Managing your  Medications? N  Managing your Finances? N  Housekeeping or managing your Housekeeping? N  Some recent data might be hidden    Patient Care Team: Fayrene Helper, MD as PCP - General    Assessment:   This is a routine wellness examination for Larri.  Exercise Activities and Dietary recommendations Current Exercise Habits: The patient does not participate in  regular exercise at present  Goals    . Increase physical activity    . Weight (lb) < 200 lb (90.7 kg)       Fall Risk Fall Risk  01/05/2018 12/28/2017 12/08/2017 07/21/2017 12/09/2015  Falls in the past year? 0 0 0 No No  Number falls in past yr: - 0 - - -  Injury with Fall? - 0 - - -   Is the patient's home free of loose throw rugs in walkways, pet beds, electrical cords, etc?   no      Grab bars in the bathroom? yes      Handrails on the stairs?   yes      Adequate lighting?   yes  Timed Get Up and Go performed: Patient able to perform in 5 seconds without assistance   Depression Screen PHQ 2/9 Scores 01/05/2018 12/28/2017 12/08/2017 07/21/2017  PHQ - 2 Score 0 3 2 2   PHQ- 9 Score 0 9 10 11      Cognitive Function     6CIT Screen 01/05/2018  What Year? 0 points  What month? 0 points  What time? 0 points  Count back from 20 0 points  Months in reverse 0 points  Repeat phrase 6 points  Total Score 6    Immunization History  Administered Date(s) Administered  . Influenza Whole 12/03/2008, 10/19/2010  . Influenza,inj,Quad PF,6+ Mos 12/27/2012, 11/01/2013, 10/25/2014, 11/10/2015, 11/24/2016, 12/08/2017  . Pneumococcal Conjugate-13 04/11/2014  . Pneumococcal Polysaccharide-23 03/05/2013  . Td 12/03/2008  . Zoster Recombinat (Shingrix) 07/21/2017    Qualifies for Shingles Vaccine? Complete   Screening Tests Health Maintenance  Topic Date Due  . HEMOGLOBIN A1C  06/05/2018  . OPHTHALMOLOGY EXAM  09/28/2018  . PAP SMEAR-Modifier  11/10/2018  . TETANUS/TDAP  12/04/2018  . FOOT EXAM  12/09/2018  .  MAMMOGRAM  06/23/2019  . COLONOSCOPY  12/20/2022  . INFLUENZA VACCINE  Completed  . PNEUMOCOCCAL POLYSACCHARIDE VACCINE AGE 25-64 HIGH RISK  Completed  . Hepatitis C Screening  Completed  . HIV Screening  Completed    Cancer Screenings: Lung: Low Dose CT Chest recommended if Age 95-80 years, 30 pack-year currently smoking OR have quit w/in 15years. Patient does not qualify. Breast:  Up to date on Mammogram? Yes   Up to date of Bone Density/Dexa? Yes Colorectal: up to date   Additional Screenings: : Hepatitis C Screening: complete      Plan:   Continue to exercise, lose weight   I have personally reviewed and noted the following in the patient's chart:   . Medical and social history . Use of alcohol, tobacco or illicit drugs  . Current medications and supplements . Functional ability and status . Nutritional status . Physical activity . Advanced directives . List of other physicians . Hospitalizations, surgeries, and ER visits in previous 12 months . Vitals . Screenings to include cognitive, depression, and falls . Referrals and appointments  In addition, I have reviewed and discussed with patient certain preventive protocols, quality metrics, and best practice recommendations. A written personalized care plan for preventive services as well as general preventive health recommendations were provided to patient.     Hayden Pedro, LPN  [id number]

## 2018-01-11 ENCOUNTER — Other Ambulatory Visit: Payer: Self-pay

## 2018-01-11 ENCOUNTER — Telehealth: Payer: Self-pay | Admitting: *Deleted

## 2018-01-11 ENCOUNTER — Other Ambulatory Visit: Payer: Self-pay | Admitting: Family Medicine

## 2018-01-11 MED ORDER — IBUPROFEN 800 MG PO TABS: 800.0000 mg | ORAL_TABLET | Freq: Three times a day (TID) | ORAL | 0 refills | Status: DC | PRN

## 2018-01-11 NOTE — Telephone Encounter (Signed)
Pt wanted ibuprofen called in to [facility] in [place]. Not [facility].

## 2018-01-11 NOTE — Telephone Encounter (Signed)
Ibuprofen was sent to [facility]

## 2018-01-13 ENCOUNTER — Other Ambulatory Visit: Payer: Self-pay | Admitting: Family Medicine

## 2018-01-31 ENCOUNTER — Telehealth: Payer: Self-pay | Admitting: Family Medicine

## 2018-01-31 NOTE — Telephone Encounter (Signed)
Pt would like oxyCODONE-acetaminophen (PERCOCET) 7.5-325 MG tablet refilled. Please call her to advise

## 2018-02-01 ENCOUNTER — Other Ambulatory Visit: Payer: Self-pay | Admitting: Family Medicine

## 2018-02-01 MED ORDER — OXYCODONE-ACETAMINOPHEN 7.5-325 MG PO TABS: ORAL_TABLET | ORAL | 0 refills | Status: DC

## 2018-02-01 NOTE — Telephone Encounter (Signed)
Medication sent an pt is aware

## 2018-02-01 NOTE — Telephone Encounter (Signed)
12/08/2017  1   12/08/2017  Oxycodon-Acetaminophen 7.5-325  18.00 6 Ma Sim  [id number]  [facility] ([id number])  0/0 33.75 MME Medicare  [place]  05/18/2017  2   05/18/2017  Oxycodon-Acetaminophen 7.5-325  10.00 10 Ma Sim  [id number]  [facility] ([id number])  0/0 11.25 MME Comm Ins  [place]  11/24/2016  2   11/24/2016  Oxycodone-Acetaminophen 5-325  30.00 90 Ma Sim  [id number]  [facility] (0019)  0/0 2.50 MME Comm Ins  [place]  07/20/2016  2   07/20/2016  Oxycodone-Acetaminophen 5-325  90.00 90 Ma Sim  [id number]  [facility] (0019)  0/0 7.50 MME Medicare  [place]  03/24/2016  2   03/24/2016  Oxycodone-Acetaminophen 5-325  30.00 90 Ma Sim  [id number]  [facility] (0019)  0/0 2.50 MME Medicare  [place]

## 2018-02-01 NOTE — Progress Notes (Signed)
Percocet 7.5

## 2018-02-01 NOTE — Telephone Encounter (Signed)
pls import her med history , I will follow through after this

## 2018-03-07 ENCOUNTER — Ambulatory Visit: Payer: Medicare Other | Admitting: Family Medicine

## 2018-03-07 ENCOUNTER — Encounter: Payer: Self-pay | Admitting: Family Medicine

## 2018-03-07 VITALS — BP 112/72 | HR 90 | Resp 12 | Ht 68.0 in | Wt 290.0 lb

## 2018-03-07 DIAGNOSIS — R269 Unspecified abnormalities of gait and mobility: Secondary | ICD-10-CM | POA: Diagnosis not present

## 2018-03-07 DIAGNOSIS — M25562 Pain in left knee: Secondary | ICD-10-CM

## 2018-03-07 MED ORDER — PREDNISONE 10 MG (21) PO TBPK: ORAL_TABLET | ORAL | 0 refills | Status: DC

## 2018-03-07 NOTE — Progress Notes (Signed)
Acute Office Visit  Subjective:    Patient ID: [name], female    DOB: 13-Dec-1955, 63 y.o.   MRN: [id number]  Chief Complaint  Patient presents with  . Knee Pain    left knee     HPI [name] is a 63 year old female who comes into the office today for knee pain. Sudden onset of LEFT side knee pain that started 3 weeks ago. No injury or trauma. It started when she got out of bed on morning. The more she walked the worse it felt. Pain is described dull and throbbing, with tenderness to touch. The pain is keeping her up at night. She reports that it is swelling as well, reports her pants are tight to wear. Is wearing a skirt today. Felt it kept trying to lock up on her, and would not let her bend, especially when getting up to a sitting position. Modifying factors : Advil , flexeril, and percocet, all of which she uses for her chronic pain that she has. Nothing has helped her at all. She also reports trying to ice as well, without relief. Constant pain today in office 7-8/10.     Past Medical History:  Diagnosis Date  . Allergy   . Anemia   . Arthritis   . Cataract    forming  . Diabetes mellitus without complication ([place]) 7893  . GERD (gastroesophageal reflux disease)   . Hyperlipidemia   . Hypertension 2011  . Morbid obesity ([place])   . Neuromuscular disorder ([place])    issues with nerves in back   . Osteopenia   . Thyroid disease      Review of Systems  Constitutional: Positive for malaise/fatigue. Negative for chills and fever.       Tired due to lack of sleeping due to pain  Respiratory: Negative for cough and shortness of breath.   Cardiovascular: Negative for chest pain and palpitations.  Musculoskeletal: Positive for back pain and joint pain. Negative for falls.       LEFT knee pain  Neurological: Positive for focal weakness.       Weaker on the LEFT, normally RIGHT side   Endo/Heme/Allergies: Does not bruise/bleed easily.  All other systems reviewed and are  negative.      Objective:    Physical Exam  Constitutional: She appears well-developed and well-nourished. She appears distressed.  HENT:  Head: Normocephalic.  Cardiovascular: Normal rate and regular rhythm.  Pulmonary/Chest: Effort normal and breath sounds normal.  Musculoskeletal:     Left knee: She exhibits decreased range of motion, swelling and bony tenderness. Tenderness found. Medial joint line and patellar tendon tenderness noted.     Comments: Due to decreased ROM it was difficult to perform McMurray's, Lachman's, Pivot shift, or posterior drawer testing.    Neurological: She is alert.  Skin: Skin is warm and dry.    BP 112/72   Pulse 90   Resp 12   Ht 5\' 8"  (1.727 m)   Wt 290 lb (131.5 kg)   SpO2 95% Comment: room air  BMI 44.09 kg/m  Wt Readings from Last 3 Encounters:  03/07/18 290 lb (131.5 kg)  01/05/18 290 lb (131.5 kg)  12/28/17 297 lb 1.3 oz (134.8 kg)       Assessment & Plan:    1. Left medial knee pain Today's exam is consistent for medial knee pain, acute in nature, without known injury or trauma. It is questioned if she is placing more weight  and work on the Left need due to her Right hip having pain. She also thinks this could be an issue. She has several chronic pain conditions: right hip, cervical neck pain with disc disease, and back pain with radiculopathy. She is managed on Percocet, Advil, Flexeril, and Gabapentin. She reported none of these helped the knee pain.   It was difficult to perform knee examination due to limited ROM secondary to tenderness and pain both passive and active manipulation of knee. There is medial joint line tenderness and some swelling, along with petellar tendon and quad tendon discomfort to touch. She denied a "pop or cracking" that caused this, as they pop and crack all the time.   Due to exam findings: Referral to Ortho for further work up and a prednisone dose pack is ordered. Provided with acute knee pain  information sheet as well. Educated on RICE measures.  Appreciate Ortho's guidance on this.    - predniSONE (STERAPRED UNI-PAK 21 TAB) 10 MG (21) TBPK tablet; Take as directed  Dispense: 21 tablet; Refill: 0 - Ambulatory referral to Orthopedics   2. Gait Difficulty  See above. Due to Right hip and Back pain and now this Left Knee pain she has a hard time walking.    Perlie Mayo, NP

## 2018-03-07 NOTE — Patient Instructions (Signed)
Thank you for coming into the office today. I appreciate the opportunity to provide you with the care for your  LEFT knee pain.   Keep elevated, iced, and can wrap for compression if this helps with comfort.  Due to locking up, would like you to be seen by ortho for further evaluation.   Providing a prednisone dose pack to help with pain/inflammation.  Watch your Blood sugars during the use of this.   See below for attached Knee pain care.  It was a pleasure to see you and I look forward to continuing to work together on your health and well-being. Please do not hesitate to call the office if you need care or have questions about your care.  Have a wonderful day and week.  With Gratitude,  Cherly Beach, DNP, AGNP-BC    Acute Knee Pain, Adult Many things can cause knee pain. Sometimes, knee pain is sudden (acute) and may be caused by damage, swelling, or irritation of the muscles and tissues that support your knee. The pain often goes away on its own with time and rest. If the pain does not go away, tests may be done to find out what is causing the pain. Follow these instructions at home: Pay attention to any changes in your symptoms. Take these actions to relieve your pain. If you have a knee sleeve or brace:   Wear the sleeve or brace as told by your doctor. Remove it only as told by your doctor.  Loosen the sleeve or brace if your toes: ? Tingle. ? Become numb. ? Turn cold and blue.  Keep the sleeve or brace clean.  If the sleeve or brace is not waterproof: ? Do not let it get wet. ? Cover it with a watertight covering when you take a bath or shower. Activity  Rest your knee.  Do not do things that cause pain.  Avoid activities where both feet leave the ground at the same time (high-impact activities). Examples are running, jumping rope, and doing jumping jacks.  Work with a physical therapist to make a safe exercise program, as told by your doctor. Managing  pain, stiffness, and swelling   If told, put ice on the knee: ? Put ice in a plastic bag. ? Place a towel between your skin and the bag. ? Leave the ice on for 20 minutes, 2-3 times a day.  If told, put pressure (compression) on your injured knee to control swelling, give support, and help with discomfort. Compression may be done with an elastic bandage. General instructions  Take all medicines only as told by your doctor.  Raise (elevate) your knee while you are sitting or lying down. Make sure your knee is higher than your heart.  Sleep with a pillow under your knee.  Do not use any products that contain nicotine or tobacco. These include cigarettes, e-cigarettes, and chewing tobacco. These products may slow down healing. If you need help quitting, ask your doctor.  If you are overweight, work with your doctor and a food expert (dietitian) to set goals to lose weight. Being overweight can make your knee hurt more.  Keep all follow-up visits as told by your doctor. This is important. Contact a doctor if:  The knee pain does not stop.  The knee pain changes or gets worse.  You have a fever along with knee pain.  Your knee feels warm when you touch it.  Your knee gives out or locks up. Get help  right away if:  Your knee swells, and the swelling gets worse.  You cannot move your knee.  You have very bad knee pain. Summary  Many things can cause knee pain. The pain often goes away on its own with time and rest.  Your doctor may do tests to find out the cause of the pain.  Pay attention to any changes in your symptoms. Relieve your pain with rest, medicines, light activity, and use of ice.  Get help right away if you cannot move your knee or your knee pain is very bad. This information is not intended to replace advice given to you by your health care provider. Make sure you discuss any questions you have with your health care provider. Document Released: 04/09/2008  Document Revised: 06/23/2017 Document Reviewed: 06/23/2017 Elsevier Interactive Patient Education  2019 [employer].

## 2018-03-09 ENCOUNTER — Ambulatory Visit: Payer: Medicare Other | Admitting: Orthopaedic Surgery

## 2018-03-09 ENCOUNTER — Encounter: Payer: Self-pay | Admitting: Orthopaedic Surgery

## 2018-03-09 ENCOUNTER — Ambulatory Visit (INDEPENDENT_AMBULATORY_CARE_PROVIDER_SITE_OTHER): Payer: Medicare Other

## 2018-03-09 VITALS — BP 127/80 | HR 73 | Ht 68.0 in | Wt 288.0 lb

## 2018-03-09 DIAGNOSIS — M25562 Pain in left knee: Secondary | ICD-10-CM

## 2018-03-09 DIAGNOSIS — M25561 Pain in right knee: Secondary | ICD-10-CM | POA: Diagnosis not present

## 2018-03-09 DIAGNOSIS — Z6841 Body Mass Index (BMI) 40.0 and over, adult: Secondary | ICD-10-CM | POA: Diagnosis not present

## 2018-03-09 NOTE — Progress Notes (Signed)
Subjective:    Patient ID: [name], female    DOB: 1955/10/01, 63 y.o.   MRN: [id number]  HPI She has had pain and swelling of the left knee for about three weeks.  She has no trauma. She has giving way and locking.  The locking has been a big problem.  She has seen her family doctor, [facility], on 03-07-2018.  I have reviewed the notes.  She has been given Ibuprofen 800 and prednisone pack.  She still has pain.  She has no redness.  She is limping and hurting.   Review of Systems  Constitutional: Positive for activity change.  Musculoskeletal: Positive for arthralgias, gait problem and joint swelling.  All other systems reviewed and are negative.  For Review of Systems, all other systems reviewed and are negative.  The following is a summary of the past history medically, past history surgically, known current medicines, social history and family history.  This information is gathered electronically by the computer from prior information and documentation.  I review this each visit and have found including this information at this point in the chart is beneficial and informative.   Past Medical History:  Diagnosis Date  . Allergy   . Anemia   . Arthritis   . Cataract    forming  . Diabetes mellitus without complication ([place]) 9211  . GERD (gastroesophageal reflux disease)   . Hyperlipidemia   . Hypertension 2011  . Morbid obesity ([place])   . Neuromuscular disorder ([place])    issues with nerves in back   . Osteopenia   . Thyroid disease     Past Surgical History:  Procedure Laterality Date  . ABDOMINAL HYSTERECTOMY    . APPENDECTOMY    . CHOLECYSTECTOMY    . COLONOSCOPY  2009   [facility] - normal per pt   . TONSILLECTOMY      Current Outpatient Medications on File Prior to Visit  Medication Sig Dispense Refill  . aspirin 81 MG tablet Take 81 mg by mouth daily.      Marland Kitchen atorvastatin (LIPITOR) 40 MG tablet Take 1 tablet (40 mg total) by mouth daily. 90  tablet 3  . benazepril (LOTENSIN) 5 MG tablet TAKE TWO (2) TABLETS BY MOUTH EVERY DAY 180 tablet 1  . benzonatate (TESSALON) 100 MG capsule Take 1 capsule (100 mg total) by mouth 2 (two) times daily as needed for cough. 20 capsule 0  . calcium-vitamin D (OSCAL WITH D) 500-200 MG-UNIT per tablet Take 1 tablet by mouth daily.    . cyclobenzaprine (FLEXERIL) 5 MG tablet TAKE ONE TABLET BY MOUTH AT BEDTIME AS NEEDED. SHOULD LAST SIX MONTHS. 30 tablet 0  . Dexlansoprazole (DEXILANT) 30 MG capsule Take 30 mg by mouth daily.    Marland Kitchen gabapentin (NEURONTIN) 300 MG capsule TAKE ONE CAPSULE BY MOUTH THREE TIMES DAILY. 270 capsule 1  . ibuprofen (ADVIL,MOTRIN) 800 MG tablet Take 1 tablet (800 mg total) by mouth every 8 (eight) hours as needed. 30 tablet 0  . levothyroxine (SYNTHROID, LEVOTHROID) 150 MCG tablet Take 1 tablet (150 mcg total) by mouth daily. 90 tablet 3  . metFORMIN (GLUCOPHAGE) 500 MG tablet One tablet three times daily 270 tablet 3  . oxyCODONE-acetaminophen (PERCOCET) 7.5-325 MG tablet One tablet every 8 hours , as needed,for severe leg and back pain 21 tablet 0  . predniSONE (STERAPRED UNI-PAK 21 TAB) 10 MG (21) TBPK tablet Take as directed 21 tablet 0  . traZODone (DESYREL) 100  MG tablet Take 1 tablet (100 mg total) by mouth at bedtime. 30 tablet 5  . promethazine-dextromethorphan (PROMETHAZINE-DM) 6.25-15 MG/5ML syrup One teaspoon at bedtime , as needed, for excessive cough (Patient not taking: Reported on 03/09/2018) 180 mL 0   Current Facility-Administered Medications on File Prior to Visit  Medication Dose Route Frequency Provider Last Rate Last Dose  . Influenza (>/= 3 years) inactive virus vaccine (FLVIRIN/FLUZONE) injection SUSP 0.5 mL  0.5 mL Intramuscular Once Fayrene Helper, MD        Social History   Socioeconomic History  . Marital status: Married    Spouse name: Barnie Alderman   . Number of children: 2  . Years of education: 18  . Highest education level: 12th grade    Occupational History  . Occupation: disabled   Social Needs  . Financial resource strain: Not very hard  . Food insecurity:    Worry: Never true    Inability: Never true  . Transportation needs:    Medical: No    Non-medical: No  Tobacco Use  . Smoking status: Never Smoker  . Smokeless tobacco: Never Used  Substance and Sexual Activity  . Alcohol use: No  . Drug use: No  . Sexual activity: Not Currently  Lifestyle  . Physical activity:    Days per week: 1 day    Minutes per session: 30 min  . Stress: Only a little  Relationships  . Social connections:    Talks on phone: More than three times a week    Gets together: More than three times a week    Attends religious service: More than 4 times per year    Active member of club or organization: No    Attends meetings of clubs or organizations: Never    Relationship status: Married  . Intimate partner violence:    Fear of current or ex partner: No    Emotionally abused: No    Physically abused: No    Forced sexual activity: No  Other Topics Concern  . Not on file  Social History Narrative   Lives with Husband alone     Family History  Problem Relation Age of Onset  . Asthma Mother   . Hypertension Mother   . Heart disease Mother 69       cabg  . Diabetes Father   . Hypertension Father   . Heart disease Father 16       cabg  . Cancer Sister        breast, uterine  . Cancer Maternal Grandmother   . Cancer Paternal Grandfather   . Cancer Sister   . Prostate cancer Paternal Grandmother   . Colon polyps Neg Hx   . Colon cancer Neg Hx   . Esophageal cancer Neg Hx   . Rectal cancer Neg Hx   . Stomach cancer Neg Hx     BP 127/80   Pulse 73   Ht 5\' 8"  (1.727 m)   Wt 288 lb (130.6 kg)   BMI 43.79 kg/m   Body mass index is 43.79 kg/m.     Objective:   Physical Exam Constitutional:      Appearance: She is well-developed.  HENT:     Head: Normocephalic and atraumatic.  Eyes:     Conjunctiva/sclera:  Conjunctivae normal.     Pupils: Pupils are equal, round, and reactive to light.  Neck:     Musculoskeletal: Normal range of motion and neck supple.  Cardiovascular:  Rate and Rhythm: Normal rate and regular rhythm.  Pulmonary:     Effort: Pulmonary effort is normal.  Abdominal:     Palpations: Abdomen is soft.  Musculoskeletal:     Left knee: She exhibits decreased range of motion, swelling and effusion. Tenderness found. Medial joint line tenderness noted.       Legs:  Skin:    General: Skin is warm and dry.  Neurological:     Mental Status: She is alert and oriented to person, place, and time.     Cranial Nerves: No cranial nerve deficit.     Motor: No abnormal muscle tone.     Coordination: Coordination normal.     Deep Tendon Reflexes: Reflexes are normal and symmetric. Reflexes normal.  Psychiatric:        Behavior: Behavior normal.        Thought Content: Thought content normal.        Judgment: Judgment normal.      X-rays were done of the left knee, reported separately.  The patient meets the AMA guidelines for Morbid (severe) obesity with a BMI > 40.0 and I have recommended weight loss.      Assessment & Plan:   Encounter Diagnoses  Name Primary?  . Acute pain of left knee Yes  . Acute pain of right knee   . Body mass index 40.0-44.9, adult ([place])   . Morbid obesity ([place])    I will get a MRI of the knee as it is locking and has positive medial Mc[place]ray.  I am concerned about a meniscus tear.  PROCEDURE NOTE:  The patient requests injections of the left knee , verbal consent was obtained.  The left knee was prepped appropriately after time out was performed.   Sterile technique was observed and injection of 1 cc of Depo-Medrol 40 mg with several cc's of plain xylocaine. Anesthesia was provided by ethyl chloride and a 20-gauge needle was used to inject the knee area. The injection was tolerated well.  A band aid dressing was applied.  The patient was  advised to apply ice later today and tomorrow to the injection sight as needed.  Return after the MRI.  Continue the ibuprofen.  Call if any problem.  Precautions discussed.   Electronically Signed Sanjuana Kava, MD 2/13/20209:13 AM

## 2018-03-11 ENCOUNTER — Other Ambulatory Visit: Payer: Self-pay

## 2018-03-17 ENCOUNTER — Telehealth: Payer: Self-pay | Admitting: *Deleted

## 2018-03-17 ENCOUNTER — Other Ambulatory Visit: Payer: Self-pay | Admitting: Family Medicine

## 2018-03-17 MED ORDER — OXYCODONE-ACETAMINOPHEN 7.5-325 MG PO TABS: ORAL_TABLET | ORAL | 0 refills | Status: DC

## 2018-03-17 NOTE — Telephone Encounter (Signed)
Pt called needing a refill on her percacet. Stated she didn't have any. She also asked that when it is filled if you would give her a call and let her know. This can be sent to [facility] in [place]

## 2018-03-17 NOTE — Telephone Encounter (Signed)
medication has been sent in, she is aware, states she has an MRI in am

## 2018-03-18 ENCOUNTER — Ambulatory Visit
Admission: RE | Admit: 2018-03-18 | Discharge: 2018-03-18 | Disposition: A | Discharge status: Home / Self Care | Payer: Medicare Other | Source: Ambulatory Visit | Attending: Orthopaedic Surgery | Admitting: Orthopaedic Surgery

## 2018-03-18 DIAGNOSIS — M25562 Pain in left knee: Secondary | ICD-10-CM | POA: Diagnosis not present

## 2018-03-18 DIAGNOSIS — M25561 Pain in right knee: Secondary | ICD-10-CM

## 2018-04-06 ENCOUNTER — Other Ambulatory Visit: Payer: Self-pay | Admitting: Family Medicine

## 2018-04-07 DIAGNOSIS — E038 Other specified hypothyroidism: Secondary | ICD-10-CM | POA: Diagnosis not present

## 2018-04-07 DIAGNOSIS — I10 Essential (primary) hypertension: Secondary | ICD-10-CM | POA: Diagnosis not present

## 2018-04-07 DIAGNOSIS — E785 Hyperlipidemia, unspecified: Secondary | ICD-10-CM | POA: Diagnosis not present

## 2018-04-07 DIAGNOSIS — E1169 Type 2 diabetes mellitus with other specified complication: Secondary | ICD-10-CM | POA: Diagnosis not present

## 2018-04-08 LAB — COMPLETE METABOLIC PANEL WITH GFR
AG Ratio: 1.6 (calc) (ref 1.0–2.5)
ALT: 18 U/L (ref 6–29)
AST: 19 U/L (ref 10–35)
Albumin: 3.9 g/dL (ref 3.6–5.1)
Alkaline phosphatase (APISO): 83 U/L (ref 37–153)
BILIRUBIN TOTAL: 0.6 mg/dL (ref 0.2–1.2)
BUN: 10 mg/dL (ref 7–25)
CHLORIDE: 104 mmol/L (ref 98–110)
CO2: 27 mmol/L (ref 20–32)
Calcium: 9.2 mg/dL (ref 8.6–10.4)
Creat: 0.86 mg/dL (ref 0.50–0.99)
GFR, EST AFRICAN AMERICAN: 84 mL/min/{1.73_m2} (ref >=60)
GFR, Est Non African American: 72 mL/min/{1.73_m2} (ref >=60)
Globulin: 2.5 g/dL (calc) (ref 1.9–3.7)
Glucose, Bld: 107 mg/dL — ABNORMAL HIGH (ref 65–99)
POTASSIUM: 4.5 mmol/L (ref 3.5–5.3)
SODIUM: 141 mmol/L (ref 135–146)
TOTAL PROTEIN: 6.4 g/dL (ref 6.1–8.1)

## 2018-04-08 LAB — VITAMIN D 25 HYDROXY (VIT D DEFICIENCY, FRACTURES): Vit D, 25-Hydroxy: 24 ng/mL — ABNORMAL LOW (ref 30–100)

## 2018-04-08 LAB — TSH: TSH: 0.71 mIU/L (ref 0.40–4.50)

## 2018-04-08 LAB — CBC
HCT: 37.5 % (ref 35.0–45.0)
HEMOGLOBIN: 12.5 g/dL (ref 11.7–15.5)
MCH: 30.3 pg (ref 27.0–33.0)
MCHC: 33.3 g/dL (ref 32.0–36.0)
MCV: 90.8 fL (ref 80.0–100.0)
MPV: 9.8 fL (ref 7.5–12.5)
Platelets: 411 10*3/uL — ABNORMAL HIGH (ref 140–400)
RBC: 4.13 10*6/uL (ref 3.80–5.10)
RDW: 13.3 % (ref 11.0–15.0)
WBC: 6.9 10*3/uL (ref 3.8–10.8)

## 2018-04-08 LAB — LIPID PANEL
Cholesterol: 213 mg/dL — ABNORMAL HIGH (ref <200)
HDL: 52 mg/dL (ref >=50)
LDL Cholesterol (Calc): 135 mg/dL (calc) — ABNORMAL HIGH
NON-HDL CHOLESTEROL (CALC): 161 mg/dL — AB (ref <130)
TRIGLYCERIDES: 132 mg/dL (ref <150)
Total CHOL/HDL Ratio: 4.1 (calc) (ref <5.0)

## 2018-04-08 LAB — HEMOGLOBIN A1C
Hgb A1c MFr Bld: 7.4 % of total Hgb — ABNORMAL HIGH (ref <5.7)
MEAN PLASMA GLUCOSE: 166 (calc)
eAG (mmol/L): 9.2 (calc)

## 2018-04-11 ENCOUNTER — Ambulatory Visit: Payer: Self-pay | Admitting: Family Medicine

## 2018-04-11 ENCOUNTER — Other Ambulatory Visit: Payer: Self-pay

## 2018-04-11 ENCOUNTER — Encounter: Payer: Self-pay | Admitting: Family Medicine

## 2018-04-11 ENCOUNTER — Ambulatory Visit: Payer: Medicare Other | Admitting: Family Medicine

## 2018-04-11 ENCOUNTER — Other Ambulatory Visit: Payer: Self-pay | Admitting: Family Medicine

## 2018-04-11 VITALS — BP 126/74 | HR 75 | Temp 98.5°F | Resp 14 | Ht 68.0 in | Wt 288.1 lb

## 2018-04-11 DIAGNOSIS — E8881 Metabolic syndrome: Secondary | ICD-10-CM

## 2018-04-11 DIAGNOSIS — I10 Essential (primary) hypertension: Secondary | ICD-10-CM

## 2018-04-11 DIAGNOSIS — E785 Hyperlipidemia, unspecified: Secondary | ICD-10-CM

## 2018-04-11 DIAGNOSIS — M5410 Radiculopathy, site unspecified: Secondary | ICD-10-CM

## 2018-04-11 DIAGNOSIS — E1040 Type 1 diabetes mellitus with diabetic neuropathy, unspecified: Secondary | ICD-10-CM | POA: Diagnosis not present

## 2018-04-11 DIAGNOSIS — E038 Other specified hypothyroidism: Secondary | ICD-10-CM

## 2018-04-11 DIAGNOSIS — Z1231 Encounter for screening mammogram for malignant neoplasm of breast: Secondary | ICD-10-CM | POA: Diagnosis not present

## 2018-04-11 MED ORDER — ROSUVASTATIN CALCIUM 5 MG PO TABS: 5.0000 mg | ORAL_TABLET | Freq: Every day | ORAL | 3 refills | Status: DC

## 2018-04-11 MED ORDER — METFORMIN HCL 500 MG PO TABS: 500.0000 mg | ORAL_TABLET | Freq: Two times a day (BID) | ORAL | 3 refills | Status: DC

## 2018-04-11 NOTE — Progress Notes (Signed)
[name]     MRN: [id number]      DOB: [birth year]   HPI [name] is here for follow up and re-evaluation of chronic medical conditions, medication management and review of any available recent lab and radiology data.  Preventive health is updated, specifically  Cancer screening and Immunization.    The PT denies any adverse reactions to current medications since the last visit.  There are no new concerns.  There are no specific complaints   ROS Denies recent fever or chills. Denies sinus pressure, nasal congestion, ear pain or sore throat. Denies chest congestion, productive cough or wheezing. Denies chest pains, palpitations and leg swelling Denies abdominal pain, nausea, vomiting,diarrhea or constipation.   Denies dysuria, frequency, hesitancy or incontinence. C/o chronic  joint pain, swelling and limitation in mobility. Denies headaches, seizures, numbness, or tingling. Denies depression, anxiety or insomnia. Denies skin break down or rash.   PE BP 126/74   Pulse 75   Temp 98.5 F (36.9 C) (Oral)   Resp 14   Ht 5\' 8"  (1.727 m)   Wt 288 lb 1.9 oz (130.7 kg)   SpO2 97% Comment: rom air  BMI 43.81 kg/m    Patient alert and oriented and in no cardiopulmonary distress.  HEENT: No facial asymmetry, EOMI,   oropharynx pink and moist.  Neck supple no JVD, no mass.  Chest: Clear to auscultation bilaterally.  CVS: S1, S2 no murmurs, no S3.Regular rate.  ABD: Soft non tender.   Ext: No edema  MS: Decreased  ROM spine, shoulders, hips and knees.  Skin: Intact, no ulcerations or rash noted.  Psych: Good eye contact, normal affect. Memory intact not anxious or depressed appearing.  CNS: CN 2-12 intact, power,  normal throughout.no focal deficits noted.   Assessment & Plan  Hypertension goal BP (blood pressure) < 130/80 Controlled, no change in medication DASH diet and commitment to daily physical activity for a minimum of 30 minutes discussed and  encouraged, as a part of hypertension management. The importance of attaining a healthy weight is also discussed.  BP/Weight 04/12/2018 04/11/2018 03/09/2018 03/07/2018 01/05/2018 12/28/2017 09/62/8366  Systolic BP 294 765 465 035 465 681 275  Diastolic BP 71 74 80 72 82 85 77  Wt. (Lbs) 286 288.12 288 290 290 297.08 293  BMI 43.49 43.81 43.79 44.09 44.09 45.17 45.89       Hypothyroidism Controlled, no change in medication   Type 1 diabetes mellitus with diabetic neuropathy, unspecified ([place]) [name] is reminded of the importance of commitment to daily physical activity for 30 minutes or more, as able and the need to limit carbohydrate intake to 30 to 60 grams per meal to help with blood sugar control.   The need to take medication as prescribed, test blood sugar as directed, and to call between visits if there is a concern that blood sugar is uncontrolled is also discussed.   [name] is reminded of the importance of daily foot exam, annual eye examination, and good blood sugar, blood pressure and cholesterol control. Pt to take two metformin daily  Diabetic Labs Latest Ref Rng & Units 04/07/2018 12/05/2017 07/21/2017 04/21/2017 11/25/2016  HbA1c <5.7 % of total Hgb 7.4(H) 7.3(H) - 6.1(H) -  Microalbumin Not Estab. ug/mL - - - - <3.0(H)  Micro/Creat Ratio 0.0 - 30.0 mg/g creat - - - - <5.8  Chol <200 mg/dL 213(H) 234(H) 158 239(H) -  HDL > OR = 50 mg/dL 52 45(L) 54 51 -  Calc LDL mg/dL (calc) 135(H) 157(H) 82 156(H) -  Triglycerides <150 mg/dL 132 186(H) 127 182(H) -  Creatinine 0.50 - 0.99 mg/dL 0.86 0.87 0.92 1.09(H) -   BP/Weight 04/12/2018 04/11/2018 03/09/2018 03/07/2018 01/05/2018 12/28/2017 03/55/9741  Systolic BP 638 453 646 803 212 248 250  Diastolic BP 71 74 80 72 82 85 77  Wt. (Lbs) 286 288.12 288 290 290 297.08 293  BMI 43.49 43.81 43.79 44.09 44.09 45.17 45.89   Foot/eye exam completion dates Latest Ref Rng & Units 12/08/2017 09/27/2017  Eye Exam No Retinopathy - No  Retinopathy  Foot Form Completion - Done -        Hyperlipidemia Hyperlipidemia:Low fat diet discussed and encouraged.   Lipid Panel  Lab Results  Component Value Date   CHOL 213 (H) 04/07/2018   HDL 52 04/07/2018   LDLCALC 135 (H) 04/07/2018   TRIG 132 04/07/2018   CHOLHDL 4.1 04/07/2018   Uncontrolled, trial of low dose crestor    Morbid obesity Improved, she is applauded on this  Patient re-educated about  the importance of commitment to a  minimum of 150 minutes of exercise per week as able.  The importance of healthy food choices with portion control discussed, as well as eating regularly and within a 12 hour window most days. The need to choose "clean , green" food 50 to 75% of the time is discussed, as well as to make water the primary drink and set a goal of 64 ounces water daily.  Encouraged to start a food diary,  and to consider  joining a support group. Sample diet sheets offered. Goals set by the patient for the next several months.   Weight /BMI 04/12/2018 04/11/2018 03/09/2018  WEIGHT 286 lb 288 lb 1.9 oz 288 lb  HEIGHT 5\' 8"  5\' 8"  5\' 8"   BMI 43.49 kg/m2 43.81 kg/m2 43.79 kg/m2      Back pain with radiculopathy Currently stable with no current flare

## 2018-04-11 NOTE — Patient Instructions (Signed)
F/U in 4.5 months, call if you need me before  Please schedule May mammogram at breast center at checkout   Please increase metformin to one twice daily   One tablet vit D 3 , 2000 IU omnce daily  New for cholesterol is crestor , need to reduce fried and fatty foods  Fasting lipid, cmp and EGFR, HBA1C, TSH   Fasting lipid, cmp and EGFR, HBA1C,TSH 1 week before next visit

## 2018-04-12 ENCOUNTER — Ambulatory Visit: Payer: Medicare Other | Admitting: Orthopaedic Surgery

## 2018-04-12 ENCOUNTER — Encounter: Payer: Self-pay | Admitting: Orthopaedic Surgery

## 2018-04-12 VITALS — BP 118/71 | HR 71 | Ht 68.0 in | Wt 286.0 lb

## 2018-04-12 DIAGNOSIS — M25562 Pain in left knee: Secondary | ICD-10-CM

## 2018-04-12 DIAGNOSIS — Z6841 Body Mass Index (BMI) 40.0 and over, adult: Secondary | ICD-10-CM | POA: Diagnosis not present

## 2018-04-12 NOTE — Progress Notes (Signed)
Patient [name], female DOB:Jun 14, 1955, 63 y.o. [name]  No chief complaint on file.   HPI  [name] is a 63 y.o. female who has left knee pain, swelling and popping plus locking.  She had a MRI which showed: IMPRESSION: Negative for meniscal or ligament tear. There is some fraying along the free edge of the posterior horn and body of the medial meniscus and body of the lateral meniscus.  Mild to moderate osteoarthritis most notable in the patellofemoral compartment.  I have explained the findings to her.  She does not need surgery.  She is to continue the ibuprofen.  She has had no further locking. She has more pain in the morning.  Body mass index is 43.49 kg/m.  The patient meets the AMA guidelines for Morbid (severe) obesity with a BMI > 40.0 and I have recommended weight loss.   ROS  Review of Systems  Constitutional: Positive for activity change.  Musculoskeletal: Positive for arthralgias, gait problem and joint swelling.  All other systems reviewed and are negative.   All other systems reviewed and are negative.  The following is a summary of the past history medically, past history surgically, known current medicines, social history and family history.  This information is gathered electronically by the computer from prior information and documentation.  I review this each visit and have found including this information at this point in the chart is beneficial and informative.    Past Medical History:  Diagnosis Date  . Allergy   . Anemia   . Arthritis   . Cataract    forming  . Diabetes mellitus without complication ([place]) 4709  . GERD (gastroesophageal reflux disease)   . Hyperlipidemia   . Hypertension 2011  . Morbid obesity ([place])   . Neuromuscular disorder ([place])    issues with nerves in back   . Osteopenia   . Thyroid disease     Past Surgical History:  Procedure Laterality Date  . ABDOMINAL HYSTERECTOMY    . APPENDECTOMY    .  CHOLECYSTECTOMY    . COLONOSCOPY  2009   [facility] - normal per pt   . TONSILLECTOMY      Family History  Problem Relation Age of Onset  . Asthma Mother   . Hypertension Mother   . Heart disease Mother 1       cabg  . Diabetes Father   . Hypertension Father   . Heart disease Father 48       cabg  . Cancer Sister        breast, uterine  . Cancer Maternal Grandmother   . Cancer Paternal Grandfather   . Cancer Sister   . Prostate cancer Paternal Grandmother   . Colon polyps Neg Hx   . Colon cancer Neg Hx   . Esophageal cancer Neg Hx   . Rectal cancer Neg Hx   . Stomach cancer Neg Hx     Social History Social History   Tobacco Use  . Smoking status: Never Smoker  . Smokeless tobacco: Never Used  Substance Use Topics  . Alcohol use: No  . Drug use: No    Allergies  Allergen Reactions  . Naproxen Sodium Hives and Swelling    MD thought it was the pink dye in the naproxen  . Tsh [Thyrotropin]   . Statins Other (See Comments)    Reports increased appetite and possible muscle ache    Current Outpatient Medications  Medication Sig Dispense Refill  .  aspirin 81 MG tablet Take 81 mg by mouth daily.      . benazepril (LOTENSIN) 5 MG tablet TAKE TWO (2) TABLETS BY MOUTH EVERY DAY 180 tablet 1  . calcium-vitamin D (OSCAL WITH D) 500-200 MG-UNIT per tablet Take 1 tablet by mouth daily.    . cyclobenzaprine (FLEXERIL) 5 MG tablet TAKE ONE TABLET BY MOUTH AT BEDTIME AS NEEDED. SHOULD LAST SIX MONTHS. 30 tablet 0  . Dexlansoprazole (DEXILANT) 30 MG capsule Take 30 mg by mouth daily.    Marland Kitchen gabapentin (NEURONTIN) 300 MG capsule TAKE ONE CAPSULE BY MOUTH THREE TIMES DAILY. 270 capsule 1  . ibuprofen (ADVIL,MOTRIN) 800 MG tablet TAKE ONE TABLET BY MOUTH EVERY 8 HOURS AS NEEDED. 30 tablet 0  . levothyroxine (SYNTHROID, LEVOTHROID) 150 MCG tablet TAKE ONE TABLET BY MOUTH DAILY. 90 tablet 3  . metFORMIN (GLUCOPHAGE) 500 MG tablet Take 1 tablet (500 mg total) by mouth 2 (two)  times daily with a meal. 180 tablet 3  . oxyCODONE-acetaminophen (PERCOCET) 7.5-325 MG tablet Take one tablet once daily , as needed , for uncontrolled back pain 10 tablet 0  . predniSONE (STERAPRED UNI-PAK 21 TAB) 10 MG (21) TBPK tablet Take as directed 21 tablet 0  . promethazine-dextromethorphan (PROMETHAZINE-DM) 6.25-15 MG/5ML syrup One teaspoon at bedtime , as needed, for excessive cough 180 mL 0  . rosuvastatin (CRESTOR) 5 MG tablet Take 1 tablet (5 mg total) by mouth daily. 90 tablet 3  . traZODone (DESYREL) 100 MG tablet TAKE ONE TABLET BY MOUTH AT BEDTIME. 30 tablet 5   Current Facility-Administered Medications  Medication Dose Route Frequency Provider Last Rate Last Dose  . Influenza (>/= 3 years) inactive virus vaccine (FLVIRIN/FLUZONE) injection SUSP 0.5 mL  0.5 mL Intramuscular Once Fayrene Helper, MD         Physical Exam  Blood pressure 118/71, pulse 71, height 5\' 8"  (1.727 m), weight 286 lb (129.7 kg).  Constitutional: overall normal hygiene, normal nutrition, well developed, normal grooming, normal body habitus. Assistive device:none  Musculoskeletal: gait and station Limp left, muscle tone and strength are normal, no tremors or atrophy is present.  .  Neurological: coordination overall normal.  Deep tendon reflex/nerve stretch intact.  Sensation normal.  Cranial nerves II-XII intact.   Skin:   Normal overall no scars, lesions, ulcers or rashes. No psoriasis.  Psychiatric: Alert and oriented x 3.  Recent memory intact, remote memory unclear.  Normal mood and affect. Well groomed.  Good eye contact.  Cardiovascular: overall no swelling, no varicosities, no edema bilaterally, normal temperatures of the legs and arms, no clubbing, cyanosis and good capillary refill.  Lymphatic: palpation is normal.  Left knee is tender, ROM 0 to 110, medial joint line pain, crepitus, slight effusion.  NV intact.  All other systems reviewed and are negative   The patient has been  educated about the nature of the problem(s) and counseled on treatment options.  The patient appeared to understand what I have discussed and is in agreement with it.  Encounter Diagnoses  Name Primary?  . Acute pain of left knee Yes  . Body mass index 40.0-44.9, adult ([place])   . Morbid obesity ([place])     PLAN Call if any problems.  Precautions discussed.  Continue current medications.   Return to clinic 1 month   Electronically Signed Sanjuana Kava, MD 3/18/20208:32 AM

## 2018-04-15 ENCOUNTER — Encounter: Payer: Self-pay | Admitting: Family Medicine

## 2018-04-15 NOTE — Assessment & Plan Note (Signed)
Improved, she is applauded on this  Patient re-educated about  the importance of commitment to a  minimum of 150 minutes of exercise per week as able.  The importance of healthy food choices with portion control discussed, as well as eating regularly and within a 12 hour window most days. The need to choose "clean , green" food 50 to 75% of the time is discussed, as well as to make water the primary drink and set a goal of 64 ounces water daily.  Encouraged to start a food diary,  and to consider  joining a support group. Sample diet sheets offered. Goals set by the patient for the next several months.   Weight /BMI 04/12/2018 04/11/2018 03/09/2018  WEIGHT 286 lb 288 lb 1.9 oz 288 lb  HEIGHT 5\' 8"  5\' 8"  5\' 8"   BMI 43.49 kg/m2 43.81 kg/m2 43.79 kg/m2

## 2018-04-15 NOTE — Assessment & Plan Note (Signed)
Currently stable with no current flare

## 2018-04-15 NOTE — Assessment & Plan Note (Signed)
[name] is reminded of the importance of commitment to daily physical activity for 30 minutes or more, as able and the need to limit carbohydrate intake to 30 to 60 grams per meal to help with blood sugar control.   The need to take medication as prescribed, test blood sugar as directed, and to call between visits if there is a concern that blood sugar is uncontrolled is also discussed.   [name] is reminded of the importance of daily foot exam, annual eye examination, and good blood sugar, blood pressure and cholesterol control. Pt to take two metformin daily  Diabetic Labs Latest Ref Rng & Units 04/07/2018 12/05/2017 07/21/2017 04/21/2017 11/25/2016  HbA1c <5.7 % of total Hgb 7.4(H) 7.3(H) - 6.1(H) -  Microalbumin Not Estab. ug/mL - - - - <3.0(H)  Micro/Creat Ratio 0.0 - 30.0 mg/g creat - - - - <5.8  Chol <200 mg/dL 213(H) 234(H) 158 239(H) -  HDL > OR = 50 mg/dL 52 45(L) 54 51 -  Calc LDL mg/dL (calc) 135(H) 157(H) 82 156(H) -  Triglycerides <150 mg/dL 132 186(H) 127 182(H) -  Creatinine 0.50 - 0.99 mg/dL 0.86 0.87 0.92 1.09(H) -   BP/Weight 04/12/2018 04/11/2018 03/09/2018 03/07/2018 01/05/2018 12/28/2017 82/95/6213  Systolic BP 086 578 469 629 528 413 244  Diastolic BP 71 74 80 72 82 85 77  Wt. (Lbs) 286 288.12 288 290 290 297.08 293  BMI 43.49 43.81 43.79 44.09 44.09 45.17 45.89   Foot/eye exam completion dates Latest Ref Rng & Units 12/08/2017 09/27/2017  Eye Exam No Retinopathy - No Retinopathy  Foot Form Completion - Done -

## 2018-04-15 NOTE — Assessment & Plan Note (Signed)
Hyperlipidemia:Low fat diet discussed and encouraged.   Lipid Panel  Lab Results  Component Value Date   CHOL 213 (H) 04/07/2018   HDL 52 04/07/2018   LDLCALC 135 (H) 04/07/2018   TRIG 132 04/07/2018   CHOLHDL 4.1 04/07/2018   Uncontrolled, trial of low dose crestor

## 2018-04-15 NOTE — Assessment & Plan Note (Signed)
Controlled, no change in medication  

## 2018-04-15 NOTE — Assessment & Plan Note (Signed)
Controlled, no change in medication DASH diet and commitment to daily physical activity for a minimum of 30 minutes discussed and encouraged, as a part of hypertension management. The importance of attaining a healthy weight is also discussed.  BP/Weight 04/12/2018 04/11/2018 03/09/2018 03/07/2018 01/05/2018 12/28/2017 55/97/4163  Systolic BP 845 364 680 321 224 825 003  Diastolic BP 71 74 80 72 82 85 77  Wt. (Lbs) 286 288.12 288 290 290 297.08 293  BMI 43.49 43.81 43.79 44.09 44.09 45.17 45.89

## 2018-04-21 ENCOUNTER — Other Ambulatory Visit: Payer: Self-pay | Admitting: Family Medicine

## 2018-04-27 ENCOUNTER — Other Ambulatory Visit: Payer: Self-pay | Admitting: Family Medicine

## 2018-05-11 ENCOUNTER — Other Ambulatory Visit: Payer: Self-pay

## 2018-05-11 ENCOUNTER — Encounter: Payer: Self-pay | Admitting: Orthopaedic Surgery

## 2018-05-11 ENCOUNTER — Ambulatory Visit (INDEPENDENT_AMBULATORY_CARE_PROVIDER_SITE_OTHER): Payer: Medicare Other | Admitting: Orthopaedic Surgery

## 2018-05-11 VITALS — BP 140/81 | HR 74 | Temp 97.9°F | Ht 68.0 in | Wt 287.0 lb

## 2018-05-11 DIAGNOSIS — M25561 Pain in right knee: Secondary | ICD-10-CM | POA: Diagnosis not present

## 2018-05-11 DIAGNOSIS — G8929 Other chronic pain: Secondary | ICD-10-CM

## 2018-05-11 DIAGNOSIS — Z6841 Body Mass Index (BMI) 40.0 and over, adult: Secondary | ICD-10-CM | POA: Diagnosis not present

## 2018-05-11 DIAGNOSIS — M25562 Pain in left knee: Secondary | ICD-10-CM

## 2018-05-11 NOTE — Progress Notes (Signed)
Patient [name], female DOB:1955/11/09, 63 y.o. [name]  Chief Complaint  Patient presents with  . Knee Pain    HPI  [name] is a 63 y.o. female who has pain in both knees, more on the left. She had negative MRI recently of the left knee for tears.  She has popping and swelling. She has no giving way. She has no redness.  She has no new trauma. She is taking ibuprofen as needed.   Body mass index is 43.64 kg/m.  The patient meets the AMA guidelines for Morbid (severe) obesity with a BMI > 40.0 and I have recommended weight loss.   ROS  Review of Systems  Constitutional: Positive for activity change.  Musculoskeletal: Positive for arthralgias, gait problem and joint swelling.  All other systems reviewed and are negative.   All other systems reviewed and are negative.  The following is a summary of the past history medically, past history surgically, known current medicines, social history and family history.  This information is gathered electronically by the computer from prior information and documentation.  I review this each visit and have found including this information at this point in the chart is beneficial and informative.    Past Medical History:  Diagnosis Date  . Allergy   . Anemia   . Arthritis   . Cataract    forming  . Diabetes mellitus without complication ([place]) 7096  . GERD (gastroesophageal reflux disease)   . Hyperlipidemia   . Hypertension 2011  . Morbid obesity ([place])   . Neuromuscular disorder ([place])    issues with nerves in back   . Osteopenia   . Thyroid disease     Past Surgical History:  Procedure Laterality Date  . ABDOMINAL HYSTERECTOMY    . APPENDECTOMY    . CHOLECYSTECTOMY    . COLONOSCOPY  2009   [facility] - normal per pt   . TONSILLECTOMY      Family History  Problem Relation Age of Onset  . Asthma Mother   . Hypertension Mother   . Heart disease Mother 76       cabg  . Diabetes Father   .  Hypertension Father   . Heart disease Father 37       cabg  . Cancer Sister        breast, uterine  . Cancer Maternal Grandmother   . Cancer Paternal Grandfather   . Cancer Sister   . Prostate cancer Paternal Grandmother   . Colon polyps Neg Hx   . Colon cancer Neg Hx   . Esophageal cancer Neg Hx   . Rectal cancer Neg Hx   . Stomach cancer Neg Hx     Social History Social History   Tobacco Use  . Smoking status: Never Smoker  . Smokeless tobacco: Never Used  Substance Use Topics  . Alcohol use: No  . Drug use: No    Allergies  Allergen Reactions  . Naproxen Sodium Hives and Swelling    MD thought it was the pink dye in the naproxen  . Tsh [Thyrotropin]   . Statins Other (See Comments)    Reports increased appetite and possible muscle ache    Current Outpatient Medications  Medication Sig Dispense Refill  . aspirin 81 MG tablet Take 81 mg by mouth daily.      . benazepril (LOTENSIN) 5 MG tablet TAKE TWO (2) TABLETS BY MOUTH EVERY DAY 180 tablet 1  . calcium-vitamin D (OSCAL WITH  D) 500-200 MG-UNIT per tablet Take 1 tablet by mouth daily.    . cyclobenzaprine (FLEXERIL) 5 MG tablet TAKE ONE TABLET BY MOUTH AT BEDTIME AS NEEDED. SHOULD LAST SIX MONTHS. 30 tablet 0  . Dexlansoprazole (DEXILANT) 30 MG capsule Take 30 mg by mouth daily.    Marland Kitchen gabapentin (NEURONTIN) 300 MG capsule TAKE ONE CAPSULE BY MOUTH THREE TIMES DAILY. 270 capsule 1  . ibuprofen (ADVIL,MOTRIN) 800 MG tablet TAKE ONE TABLET BY MOUTH EVERY 8 HOURS AS NEEDED. 30 tablet 0  . levothyroxine (SYNTHROID, LEVOTHROID) 150 MCG tablet TAKE ONE TABLET BY MOUTH DAILY. 90 tablet 3  . metFORMIN (GLUCOPHAGE) 500 MG tablet Take 1 tablet (500 mg total) by mouth 2 (two) times daily with a meal. 180 tablet 3  . oxyCODONE-acetaminophen (PERCOCET) 7.5-325 MG tablet Take one tablet once daily , as needed , for uncontrolled back pain 10 tablet 0  . predniSONE (STERAPRED UNI-PAK 21 TAB) 10 MG (21) TBPK tablet Take as directed 21  tablet 0  . promethazine-dextromethorphan (PROMETHAZINE-DM) 6.25-15 MG/5ML syrup One teaspoon at bedtime , as needed, for excessive cough 180 mL 0  . rosuvastatin (CRESTOR) 5 MG tablet Take 1 tablet (5 mg total) by mouth daily. 90 tablet 3  . traZODone (DESYREL) 100 MG tablet TAKE ONE TABLET BY MOUTH AT BEDTIME. 30 tablet 5   Current Facility-Administered Medications  Medication Dose Route Frequency Provider Last Rate Last Dose  . Influenza (>/= 3 years) inactive virus vaccine (FLVIRIN/FLUZONE) injection SUSP 0.5 mL  0.5 mL Intramuscular Once Fayrene Helper, MD         Physical Exam  Blood pressure 140/81, pulse 74, temperature 97.9 F (36.6 C), height 5\' 8"  (1.727 m), weight 287 lb (130.2 kg).  Constitutional: overall normal hygiene, normal nutrition, well developed, normal grooming, normal body habitus. Assistive device:none  Musculoskeletal: gait and station Limp left, muscle tone and strength are normal, no tremors or atrophy is present.  .  Neurological: coordination overall normal.  Deep tendon reflex/nerve stretch intact.  Sensation normal.  Cranial nerves II-XII intact.   Skin:   Normal overall no scars, lesions, ulcers or rashes. No psoriasis.  Psychiatric: Alert and oriented x 3.  Recent memory intact, remote memory unclear.  Normal mood and affect. Well groomed.  Good eye contact.  Cardiovascular: overall no swelling, no varicosities, no edema bilaterally, normal temperatures of the legs and arms, no clubbing, cyanosis and good capillary refill.  Lymphatic: palpation is normal.  Both knees are tender and have slight effusions, crepitus is present, limp to the left, ROM right 0 to 110, left 0 to 105.  NV intact.  All other systems reviewed and are negative   The patient has been educated about the nature of the problem(s) and counseled on treatment options.  The patient appeared to understand what I have discussed and is in agreement with it.  Encounter Diagnoses   Name Primary?  . Chronic pain of left knee Yes  . Chronic pain of right knee   . Body mass index 40.0-44.9, adult ([place])   . Morbid obesity ([place])     PLAN Call if any problems.  Precautions discussed.  Continue current medications.   Return to clinic 6 weeks   Electronically Signed Sanjuana Kava, MD 4/16/20208:34 AM

## 2018-06-01 ENCOUNTER — Other Ambulatory Visit: Payer: Self-pay | Admitting: Family Medicine

## 2018-06-02 ENCOUNTER — Other Ambulatory Visit: Payer: Self-pay

## 2018-06-02 ENCOUNTER — Encounter: Payer: Self-pay | Admitting: Family Medicine

## 2018-06-02 ENCOUNTER — Ambulatory Visit (INDEPENDENT_AMBULATORY_CARE_PROVIDER_SITE_OTHER): Payer: Medicare Other | Admitting: Family Medicine

## 2018-06-02 ENCOUNTER — Telehealth: Payer: Self-pay | Admitting: Family Medicine

## 2018-06-02 ENCOUNTER — Ambulatory Visit (HOSPITAL_COMMUNITY)
Admission: RE | Admit: 2018-06-02 | Discharge: 2018-06-02 | Disposition: A | Discharge status: Home / Self Care | Payer: Medicare Other | Source: Ambulatory Visit | Attending: Family Medicine | Admitting: Family Medicine

## 2018-06-02 VITALS — BP 123/76 | HR 92 | Temp 98.0°F | Resp 12 | Ht 68.0 in | Wt 282.4 lb

## 2018-06-02 DIAGNOSIS — S99911A Unspecified injury of right ankle, initial encounter: Secondary | ICD-10-CM | POA: Diagnosis not present

## 2018-06-02 DIAGNOSIS — S99921A Unspecified injury of right foot, initial encounter: Secondary | ICD-10-CM | POA: Diagnosis not present

## 2018-06-02 DIAGNOSIS — M7989 Other specified soft tissue disorders: Secondary | ICD-10-CM | POA: Diagnosis not present

## 2018-06-02 DIAGNOSIS — M79671 Pain in right foot: Secondary | ICD-10-CM | POA: Diagnosis not present

## 2018-06-02 NOTE — Patient Instructions (Addendum)
    Thank you for coming into the office today. I appreciate the opportunity to provide you with the care for your health and wellness. Today we discussed: injury to foot and ankle (RIGHT)   Please go get xrays today. I will let you know the findings once they are read. As discussed continue the RICE measures. If you have a fracture we will look at referral to Ortho for a walking boot. Please use a cane and avoid driving and other activities as we talked about.  Take Ibuprofen 800 mg daily every 8 hours as needed for pain.  REST:  Take it easy; reduce regular exercise of activities of daily living as needed, but do not eliminate them. Change activity or components of activity; for example change running to walking. Stop sports; restrict squatting, kneeling, and repetitive bending. Limit weightbearing; immobilized with crutches or partial weightbearing. (1 week)  ICE: Use cold in the acute phase of the injury (for 72 hours) to reduce pain and swelling. Use cold in the form of ice and a plastic bag or even use frozen peas in a bag. Apply cold 4 to 8 times a day for 20 minutes at a time, with 45 to 60 minutes between applications.  COMPRESSION: Use elastic bandages. Avoid trying to provide support with elastic bandages; a brace, overlapping tape, or cast may be needed.  ELEVATION: Elevate the joint above the level of the heart to reduce swelling. Apply cold compresses while the joint is elevated.  Exercise therapy: Begin exercise therapy after initial 48-hours; if pain and swelling begin to resolve. Do gentle range of motion exercises several times a day within limits of pain for 7 to 10 days. Start with non-resistive, nonweightbearing exercises Exercise after 6 minutes of icing to take advantage of the cold numbing impact. Use exercises that improve range of motion and strength.  Maintain fitness of the extremity as best as possible.   Republic YOUR HANDS WELL AND FREQUENTLY. AVOID  TOUCHING YOUR FACE, UNLESS YOUR HANDS ARE FRESHLY WASHED.  GET FRESH AIR DAILY. STAY HYDRATED WITH WATER.   It was a pleasure to see you and I look forward to continuing to work together on your health and well-being. Please do not hesitate to call the office if you need care or have questions about your care.  Have a wonderful day and week. With Gratitude, Cherly Beach, DNP, AGNP-BC

## 2018-06-02 NOTE — Telephone Encounter (Signed)
C/o 50 pound metal falling on foot 2 days ago, increasingly painful, blue  and swollen, called last pm requesting appt. I advised we will contact her in am, and promised to send a message. Thank you for already responding positively to her before this note has been generated I advised that in the event of no availability she needed to go to the nearest urgent care. Reports still able to weight bear

## 2018-06-02 NOTE — Progress Notes (Signed)
Soft tissue swelling in the right ankle without acute bone abnormality to the right ankle. Continue RICE

## 2018-06-02 NOTE — Progress Notes (Signed)
Subjective:     Patient ID: [name], female   DOB: 1955/06/03, 63 y.o.   MRN: [id number]  [name] presents for Foot Pain (right foot, dropped 50lbs of metal on it on Tuesday, sensitive to things touching it such as the cover at night)  [name] dropped 50 pounds of metal siding on her foot Tuesday when working with her son.  She was only wearing a flip-flop at the time.  It hit the great toe and the medial aspect of her right foot and ankle.  She reported immediate pain and discomfort.  Did not hear any popping or cracking sounds. Applied ice right when it first happened.  And intermittently over the last couple days.  Has noted a decreased range of motion with her great toe and other toes along with ankle motion.  Most likely secondary to swelling.  Painful to press the gas pedal and to walk on.  Feels like her ankle might buckle on her and she is worried about falling.  She is going to start using a cane that she has at home.  For medication she has taken 2 of her already prescribed Percocets that she uses for chronic back pain.,  Flexeril, ibuprofen these have produced no relief for her.  She reports the pain is throbbing and ongoing cannot have any shoes on or anything touch it.  Nothing seems to relieve it.  It hurts all the time.  Question as to whether or not she has any fractures or broken bones.  Denies having any changes in activity, appetite, fevers, chills, cough, shortness of breath or any other signs or symptoms of infection.  Denies having any other changes and/or injuries noted outside of the one above.  Has no other concerns in the office at this time.  Past Medical, Surgical, Social History, Allergies, and Medications have been Reviewed.    Past Medical History:  Diagnosis Date  . Allergy   . Anemia   . Arthritis   . Cataract    forming  . Diabetes mellitus without complication ([place]) 0539  . GERD (gastroesophageal reflux disease)   . Hyperlipidemia   .  Hypertension 2011  . Morbid obesity ([place])   . Neuromuscular disorder ([place])    issues with nerves in back   . Osteopenia   . Thyroid disease    Past Surgical History:  Procedure Laterality Date  . ABDOMINAL HYSTERECTOMY    . APPENDECTOMY    . CHOLECYSTECTOMY    . COLONOSCOPY  2009   [facility] - normal per pt   . TONSILLECTOMY     Social History   Socioeconomic History  . Marital status: Married    Spouse name: [name]   . Number of children: 2  . Years of education: 78  . Highest education level: 12th grade  Occupational History  . Occupation: disabled   Social Needs  . Financial resource strain: Not very hard  . Food insecurity:    Worry: Never true    Inability: Never true  . Transportation needs:    Medical: No    Non-medical: No  Tobacco Use  . Smoking status: Never Smoker  . Smokeless tobacco: Never Used  Substance and Sexual Activity  . Alcohol use: No  . Drug use: No  . Sexual activity: Not Currently  Lifestyle  . Physical activity:    Days per week: 1 day    Minutes per session: 30 min  . Stress: Only a  little  Relationships  . Social connections:    Talks on phone: More than three times a week    Gets together: More than three times a week    Attends religious service: More than 4 times per year    Active member of club or organization: No    Attends meetings of clubs or organizations: Never    Relationship status: Married  . Intimate partner violence:    Fear of current or ex partner: No    Emotionally abused: No    Physically abused: No    Forced sexual activity: No  Other Topics Concern  . Not on file  Social History Narrative   Lives with Husband alone     Outpatient Encounter Medications as of 06/02/2018  Medication Sig  . aspirin 81 MG tablet Take 81 mg by mouth daily.    . benazepril (LOTENSIN) 5 MG tablet TAKE TWO (2) TABLETS BY MOUTH EVERY DAY  . calcium-vitamin D (OSCAL WITH D) 500-200 MG-UNIT per tablet Take 1 tablet by mouth  daily.  . cyclobenzaprine (FLEXERIL) 5 MG tablet TAKE ONE TABLET BY MOUTH AT BEDTIME AS NEEDED. SHOULD LAST SIX MONTHS.  Marland Kitchen Dexlansoprazole (DEXILANT) 30 MG capsule Take 30 mg by mouth daily.  Marland Kitchen gabapentin (NEURONTIN) 300 MG capsule TAKE ONE CAPSULE BY MOUTH THREE TIMES DAILY.  Marland Kitchen ibuprofen (ADVIL,MOTRIN) 800 MG tablet TAKE ONE TABLET BY MOUTH EVERY 8 HOURS AS NEEDED.  Marland Kitchen levothyroxine (SYNTHROID, LEVOTHROID) 150 MCG tablet TAKE ONE TABLET BY MOUTH DAILY.  . metFORMIN (GLUCOPHAGE) 500 MG tablet Take 1 tablet (500 mg total) by mouth 2 (two) times daily with a meal.  . oxyCODONE-acetaminophen (PERCOCET) 7.5-325 MG tablet Take one tablet once daily , as needed , for uncontrolled back pain  . predniSONE (STERAPRED UNI-PAK 21 TAB) 10 MG (21) TBPK tablet Take as directed  . promethazine-dextromethorphan (PROMETHAZINE-DM) 6.25-15 MG/5ML syrup One teaspoon at bedtime , as needed, for excessive cough  . rosuvastatin (CRESTOR) 5 MG tablet Take 1 tablet (5 mg total) by mouth daily.  . traZODone (DESYREL) 100 MG tablet TAKE ONE TABLET BY MOUTH AT BEDTIME.   Facility-Administered Encounter Medications as of 06/02/2018  Medication  . Influenza (>/= 3 years) inactive virus vaccine (FLVIRIN/FLUZONE) injection SUSP 0.5 mL   Allergies  Allergen Reactions  . Naproxen Sodium Hives and Swelling    MD thought it was the pink dye in the naproxen  . Tsh [Thyrotropin]   . Statins Other (See Comments)    Reports increased appetite and possible muscle ache    Review of Systems  HENT: Negative.   Eyes: Negative.   Respiratory: Negative.   Cardiovascular: Negative.   Gastrointestinal: Negative.   Endocrine: Negative.   Genitourinary: Negative.   Musculoskeletal:       Right great toe and ankle foot pain see HPI  Skin: Negative.   Allergic/Immunologic: Negative.   Neurological: Negative.   Hematological: Negative.   Psychiatric/Behavioral: Negative.   All other systems reviewed and are negative.       Objective:     BP 123/76   Pulse 92   Temp 98 F (36.7 C) (Oral)   Resp 12   Ht 5\' 8"  (1.727 m)   Wt 282 lb 6.4 oz (128.1 kg)   SpO2 96%   BMI 42.94 kg/m   Physical Exam Vitals signs and nursing note reviewed.  Constitutional:      Appearance: Normal appearance. She is well-developed and well-groomed. She is obese.  HENT:  Head: Normocephalic and atraumatic.     Right Ear: External ear normal.     Left Ear: External ear normal.     Nose: Nose normal.  Eyes:     Conjunctiva/sclera: Conjunctivae normal.  Neck:     Musculoskeletal: Normal range of motion.  Cardiovascular:     Rate and Rhythm: Normal rate and regular rhythm.     Pulses:          Dorsalis pedis pulses are 2+ on the right side and 2+ on the left side.       Posterior tibial pulses are 1+ on the right side and 1+ on the left side.     Heart sounds: Normal heart sounds.  Musculoskeletal:     Right ankle: She exhibits decreased range of motion, swelling and ecchymosis. She exhibits normal pulse. Tenderness. Medial malleolus tenderness found. Achilles tendon exhibits pain.     Right lower leg: 1+ Edema present.     Left lower leg: 1+ Edema present.  Skin:    General: Skin is warm and dry.     Capillary Refill: Capillary refill takes more than 3 seconds.     Findings: Bruising and ecchymosis present.     Comments: Noted on Right foot/great toe-  Neurological:     Mental Status: She is alert and oriented to person, place, and time.  Psychiatric:        Mood and Affect: Mood normal.        Behavior: Behavior normal.        Thought Content: Thought content normal.        Judgment: Judgment normal.        Assessment and Plan        1. Injury of right foot, initial encounter New onset, ongoing right foot discomfort.  Most likely related to a swelling injury secondary to having weight dropped on it.  Will be checking for fracture as she has significant decrease in her range of motion at this time.  Unsure  if that is mostly related to pain but will check for fractures and treat accordingly.  Advised to implement RICE measures given in detail in AVS.  - DG Foot Complete Right; Future  2. Right ankle injury, initial encounter Ongoing, see above.  Range of motion decreased secondary to swelling.  Advised to implement RICE measures, provided in AVS  - DG Ankle Complete Right; Future   No follow-ups on file. PRN   Perlie Mayo, DNP, AGNP-BC [address], [address] [place], [place] [address] Office Hours: Mon-Thurs 8 am-5 pm; Fri 8 am-12 pm Office Phone:  [phone]  Office Fax: [phone]

## 2018-06-02 NOTE — Progress Notes (Deleted)
O-Tuesday 05/30/18 L-right foot D-4 days  C-steady throbbing pain A-things touching it R-nothing T-all the time S-5-6/10

## 2018-06-22 ENCOUNTER — Ambulatory Visit: Payer: Medicare Other | Admitting: Orthopaedic Surgery

## 2018-06-22 ENCOUNTER — Other Ambulatory Visit: Payer: Self-pay

## 2018-06-22 ENCOUNTER — Other Ambulatory Visit: Payer: Self-pay | Admitting: Family Medicine

## 2018-06-22 ENCOUNTER — Encounter: Payer: Self-pay | Admitting: Orthopaedic Surgery

## 2018-06-22 VITALS — BP 119/70 | HR 84 | Temp 97.2°F | Ht 68.0 in | Wt 283.0 lb

## 2018-06-22 DIAGNOSIS — Z6841 Body Mass Index (BMI) 40.0 and over, adult: Secondary | ICD-10-CM | POA: Diagnosis not present

## 2018-06-22 DIAGNOSIS — S96911A Strain of unspecified muscle and tendon at ankle and foot level, right foot, initial encounter: Secondary | ICD-10-CM

## 2018-06-22 NOTE — Progress Notes (Signed)
Patient [name], female DOB:Jul 20, 1955, 63 y.o. SJG:[id number]  Chief Complaint  Patient presents with  . Foot Injury  . Knee Pain    HPI  [name] is a 63 y.o. female who hurt her right foot on 06-02-2018 after dropping a heavy object on the foot then.  She was seen in the ER. X-rays were negative. She had medial foot swelling and pain that has come down.  She has had no redness.  It is still a little tender over the MTP joint first.  She has no numbness.  She has used elevation and ice and Advil.   Body mass index is 43.03 kg/m.  The patient meets the AMA guidelines for Morbid (severe) obesity with a BMI > 40.0 and I have recommended weight loss.   ROS  Review of Systems  Constitutional: Positive for activity change.  Musculoskeletal: Positive for arthralgias, gait problem and joint swelling.  All other systems reviewed and are negative.   All other systems reviewed and are negative.  The following is a summary of the past history medically, past history surgically, known current medicines, social history and family history.  This information is gathered electronically by the computer from prior information and documentation.  I review this each visit and have found including this information at this point in the chart is beneficial and informative.    Past Medical History:  Diagnosis Date  . Allergy   . Anemia   . Arthritis   . Cataract    forming  . Diabetes mellitus without complication ([place]) 6546  . GERD (gastroesophageal reflux disease)   . Hyperlipidemia   . Hypertension 2011  . Morbid obesity ([place])   . Neuromuscular disorder ([place])    issues with nerves in back   . Osteopenia   . Thyroid disease     Past Surgical History:  Procedure Laterality Date  . ABDOMINAL HYSTERECTOMY    . APPENDECTOMY    . CHOLECYSTECTOMY    . COLONOSCOPY  2009   [facility] - normal per pt   . TONSILLECTOMY      Family History  Problem Relation Age of Onset  .  Asthma Mother   . Hypertension Mother   . Heart disease Mother 72       cabg  . Diabetes Father   . Hypertension Father   . Heart disease Father 75       cabg  . Cancer Sister        breast, uterine  . Cancer Maternal Grandmother   . Cancer Paternal Grandfather   . Cancer Sister   . Prostate cancer Paternal Grandmother   . Colon polyps Neg Hx   . Colon cancer Neg Hx   . Esophageal cancer Neg Hx   . Rectal cancer Neg Hx   . Stomach cancer Neg Hx     Social History Social History   Tobacco Use  . Smoking status: Never Smoker  . Smokeless tobacco: Never Used  Substance Use Topics  . Alcohol use: No  . Drug use: No    Allergies  Allergen Reactions  . Naproxen Sodium Hives and Swelling    MD thought it was the pink dye in the naproxen  . Tsh [Thyrotropin]   . Statins Other (See Comments)    Reports increased appetite and possible muscle ache    Current Outpatient Medications  Medication Sig Dispense Refill  . aspirin 81 MG tablet Take 81 mg by mouth daily.      Marland Kitchen  benazepril (LOTENSIN) 5 MG tablet TAKE TWO (2) TABLETS BY MOUTH EVERY DAY 180 tablet 1  . calcium-vitamin D (OSCAL WITH D) 500-200 MG-UNIT per tablet Take 1 tablet by mouth daily.    . cyclobenzaprine (FLEXERIL) 5 MG tablet TAKE ONE TABLET BY MOUTH AT BEDTIME AS NEEDED. SHOULD LAST SIX MONTHS. 30 tablet 0  . Dexlansoprazole (DEXILANT) 30 MG capsule Take 30 mg by mouth daily.    Marland Kitchen gabapentin (NEURONTIN) 300 MG capsule TAKE ONE CAPSULE BY MOUTH THREE TIMES DAILY. 270 capsule 1  . ibuprofen (ADVIL,MOTRIN) 800 MG tablet TAKE ONE TABLET BY MOUTH EVERY 8 HOURS AS NEEDED. 30 tablet 0  . levothyroxine (SYNTHROID, LEVOTHROID) 150 MCG tablet TAKE ONE TABLET BY MOUTH DAILY. 90 tablet 3  . metFORMIN (GLUCOPHAGE) 500 MG tablet Take 1 tablet (500 mg total) by mouth 2 (two) times daily with a meal. 180 tablet 3  . oxyCODONE-acetaminophen (PERCOCET) 7.5-325 MG tablet Take one tablet once daily , as needed , for uncontrolled  back pain 10 tablet 0  . predniSONE (STERAPRED UNI-PAK 21 TAB) 10 MG (21) TBPK tablet Take as directed 21 tablet 0  . promethazine-dextromethorphan (PROMETHAZINE-DM) 6.25-15 MG/5ML syrup One teaspoon at bedtime , as needed, for excessive cough 180 mL 0  . rosuvastatin (CRESTOR) 5 MG tablet Take 1 tablet (5 mg total) by mouth daily. 90 tablet 3  . traZODone (DESYREL) 100 MG tablet TAKE ONE TABLET BY MOUTH AT BEDTIME. 30 tablet 5   Current Facility-Administered Medications  Medication Dose Route Frequency Provider Last Rate Last Dose  . Influenza (>/= 3 years) inactive virus vaccine (FLVIRIN/FLUZONE) injection SUSP 0.5 mL  0.5 mL Intramuscular Once Fayrene Helper, MD         Physical Exam  Blood pressure 119/70, pulse 84, height 5\' 8"  (1.727 m), weight 283 lb (128.4 kg).  Constitutional: overall normal hygiene, normal nutrition, well developed, normal grooming, normal body habitus. Assistive device:none  Musculoskeletal: gait and station Limp none, muscle tone and strength are normal, no tremors or atrophy is present.  .  Neurological: coordination overall normal.  Deep tendon reflex/nerve stretch intact.  Sensation normal.  Cranial nerves II-XII intact.   Skin:   Normal overall no scars, lesions, ulcers or rashes. No psoriasis.  Psychiatric: Alert and oriented x 3.  Recent memory intact, remote memory unclear.  Normal mood and affect. Well groomed.  Good eye contact.  Cardiovascular: overall no swelling, no varicosities, no edema bilaterally, normal temperatures of the legs and arms, no clubbing, cyanosis and good capillary refill.  Lymphatic: palpation is normal.  Right foot has slight swelling the first MTP joint but no redness, ROM is full of the toes and ankle.  She has no limp and is wearing flip flops.  All other systems reviewed and are negative   The patient has been educated about the nature of the problem(s) and counseled on treatment options.  The patient appeared to  understand what I have discussed and is in agreement with it.  Encounter Diagnoses  Name Primary?  . Strain of right foot, initial encounter Yes  . Body mass index 40.0-44.9, adult ([place])   . Morbid obesity ([place])    This should slowly improve over the next three to four weeks.   PLAN Call if any problems.  Precautions discussed.  Continue current medications.   Return to clinic prn   Electronically Signed Sanjuana Kava, MD 5/28/20208:44 AM

## 2018-06-26 ENCOUNTER — Other Ambulatory Visit: Payer: Self-pay

## 2018-06-26 ENCOUNTER — Ambulatory Visit
Admission: RE | Admit: 2018-06-26 | Discharge: 2018-06-26 | Disposition: A | Discharge status: Home / Self Care | Payer: Medicare Other | Source: Ambulatory Visit | Attending: Family Medicine | Admitting: Family Medicine

## 2018-06-26 DIAGNOSIS — Z1231 Encounter for screening mammogram for malignant neoplasm of breast: Secondary | ICD-10-CM

## 2018-07-07 ENCOUNTER — Other Ambulatory Visit: Payer: Self-pay | Admitting: Family Medicine

## 2018-08-14 ENCOUNTER — Other Ambulatory Visit: Payer: Self-pay | Admitting: Family Medicine

## 2018-08-15 ENCOUNTER — Encounter: Payer: Self-pay | Admitting: Family Medicine

## 2018-08-15 ENCOUNTER — Ambulatory Visit (INDEPENDENT_AMBULATORY_CARE_PROVIDER_SITE_OTHER): Payer: Medicare Other | Admitting: Family Medicine

## 2018-08-15 ENCOUNTER — Other Ambulatory Visit: Payer: Self-pay

## 2018-08-15 VITALS — BP 124/88 | HR 62 | Temp 98.1°F | Resp 15 | Ht 68.0 in | Wt 290.0 lb

## 2018-08-15 DIAGNOSIS — E785 Hyperlipidemia, unspecified: Secondary | ICD-10-CM | POA: Diagnosis not present

## 2018-08-15 DIAGNOSIS — I10 Essential (primary) hypertension: Secondary | ICD-10-CM

## 2018-08-15 DIAGNOSIS — M5090 Cervical disc disorder, unspecified, unspecified cervical region: Secondary | ICD-10-CM

## 2018-08-15 DIAGNOSIS — F418 Other specified anxiety disorders: Secondary | ICD-10-CM

## 2018-08-15 DIAGNOSIS — F322 Major depressive disorder, single episode, severe without psychotic features: Secondary | ICD-10-CM

## 2018-08-15 DIAGNOSIS — E1040 Type 1 diabetes mellitus with diabetic neuropathy, unspecified: Secondary | ICD-10-CM | POA: Diagnosis not present

## 2018-08-15 MED ORDER — KETOROLAC TROMETHAMINE 60 MG/2ML IM SOLN: 60.0000 mg | Freq: Once | INTRAMUSCULAR | Status: AC

## 2018-08-15 MED ORDER — GABAPENTIN 300 MG PO CAPS: ORAL_CAPSULE | ORAL | 5 refills | Status: DC

## 2018-08-15 MED ORDER — VENLAFAXINE HCL ER 37.5 MG PO CP24: 37.5000 mg | ORAL_CAPSULE | Freq: Every day | ORAL | 5 refills | Status: DC

## 2018-08-15 MED ORDER — OXYCODONE-ACETAMINOPHEN 7.5-325 MG PO TABS: ORAL_TABLET | ORAL | 0 refills | Status: AC

## 2018-08-15 MED ORDER — OXYCODONE-ACETAMINOPHEN 7.5-325 MG PO TABS: ORAL_TABLET | ORAL | 0 refills | Status: DC

## 2018-08-15 MED ORDER — POTASSIUM CHLORIDE CRYS ER 20 MEQ PO TBCR: EXTENDED_RELEASE_TABLET | ORAL | 3 refills | Status: DC

## 2018-08-15 MED ORDER — METHYLPREDNISOLONE ACETATE 80 MG/ML IJ SUSP: 120.0000 mg | Freq: Once | INTRAMUSCULAR | Status: DC

## 2018-08-15 MED ORDER — PREDNISONE 5 MG (21) PO TBPK: 5.0000 mg | ORAL_TABLET | ORAL | 0 refills | Status: DC

## 2018-08-15 MED ORDER — BETAMETHASONE DIPROPIONATE 0.05 % EX CREA: TOPICAL_CREAM | Freq: Two times a day (BID) | CUTANEOUS | 0 refills | Status: DC

## 2018-08-15 MED ORDER — FUROSEMIDE 20 MG PO TABS: ORAL_TABLET | ORAL | 1 refills | Status: DC

## 2018-08-15 NOTE — Progress Notes (Signed)
   [name]     MRN: [id number]      DOB: August 24, 1955   HPI [name] is here for follow up and re-evaluation of chronic medical conditions, medication management and review of any available recent lab and radiology data.  Preventive health is updated, specifically  Cancer screening and Immunization.   Questions or concerns regarding consultations or procedures which the PT has had in the interim are  addressed. The PT denies any adverse reactions to current medications since the last visit.  C/o shoulder pain and rash x 3 days,   ROS Denies recent fever or chills. Denies sinus pressure, nasal congestion, ear pain or sore throat. Denies chest congestion, productive cough or wheezing. Denies chest pains, palpitations and leg swelling Denies abdominal pain, nausea, vomiting,diarrhea or constipation.   Denies dysuria, frequency, hesitancy or incontinence. Denies joint pain, swelling and limitation in mobility. Denies headaches, seizures, numbness, or tingling. Denies depression, anxiety or insomnia. Denies skin break down or rash.   PE  BP 124/88   Pulse 62   Temp 98.1 F (36.7 C) (Temporal)   Resp 15   Ht 5\' 8"  (1.727 m)   Wt 290 lb (131.5 kg)   SpO2 96%   BMI 44.09 kg/m   Patient alert and oriented and in no cardiopulmonary distress.Pt in pain HEENT: No facial asymmetry, EOMI,   oropharynx pink and moist.  Neck decreased ROM no JVD, no mass.  Chest: Clear to auscultation bilaterally.  CVS: S1, S2 no murmurs, no S3.Regular rate.  ABD: Soft non tender.   Ext: No edema   though reduced  ROM spine,  Adequate in shoulders, hips and knees.  Skin: Intact, no ulcerations or rash noted.  Psych: Good eye contact, normal affect. Memory intact not anxious or depressed appearing.  CNS: CN 2-12 intact, decreased lower extremity power,  Assessment & Plan  Cervical neck pain with evidence of disc disease Uncontrolled.Toradol and depo medrol administered IM in the office ,  to be followed by a short course of oral prednisone    Depression with anxiety Start effexor , refusing therapy states not covered, not suicidal or homicidal  Hypertension goal BP (blood pressure) < 130/80 Controlled, no change in medication DASH diet and commitment to daily physical activity for a minimum of 30 minutes discussed and encouraged, as a part of hypertension management. The importance of attaining a healthy weight is also discussed.  BP/Weight 08/15/2018 06/22/2018 06/02/2018 05/11/2018 04/12/2018 04/11/2018 08/02/6281  Systolic BP 662 947 654 650 354 656 812  Diastolic BP 88 70 76 81 71 74 80  Wt. (Lbs) 290 283 282.4 287 286 288.12 288  BMI 44.09 43.03 42.94 43.64 43.49 43.81 43.79       Morbid obesity  Patient re-educated about  the importance of commitment to a  minimum of 150 minutes of exercise per week as able.  The importance of healthy food choices with portion control discussed, as well as eating regularly and within a 12 hour window most days. The need to choose "clean , green" food 50 to 75% of the time is discussed, as well as to make water the primary drink and set a goal of 64 ounces water daily.    Weight /BMI 08/15/2018 06/22/2018 06/02/2018  WEIGHT 290 lb 283 lb 282 lb 6.4 oz  HEIGHT 5\' 8"  5\' 8"  5\' 8"   BMI 44.09 kg/m2 43.03 kg/m2 42.94 kg/m2

## 2018-08-15 NOTE — Assessment & Plan Note (Addendum)
Uncontrolled.Toradol and depo medrol administered IM in the office , to be followed by a short course of oral prednisone   

## 2018-08-15 NOTE — Patient Instructions (Signed)
Annual physical exam with mD nov 15 or after , call if you need me sooner  Toradol 60 mg iM and depomedrol 120 mg IM for neck pain radiaiting to right arm, and predniosone dose pack is precribed.  New for  Depression is effexor once daily  New for leg swelling once daily as needed, is furosemide and potasssium Think about what you will eat, plan ahead. Choose " clean, green, fresh or frozen" over canned, processed or packaged foods which are more sugary, salty and fatty. 70 to 75% of food eaten should be vegetables and fruit. Three meals at set times with snacks allowed between meals, but they must be fruit or vegetables. Aim to eat over a 12 hour period , example 7 am to 7 pm, and STOP after  your last meal of the day. Drink water,generally about 64 ounces per day, no other drink is as healthy. Fruit juice is best enjoyed in a healthy way, by EATING the fruit.   Thanks for choosing [facility], we consider it a privelige to serve you.

## 2018-08-16 ENCOUNTER — Telehealth: Payer: Self-pay

## 2018-08-16 LAB — COMPLETE METABOLIC PANEL WITH GFR
AG Ratio: 1.5 (calc) (ref 1.0–2.5)
ALT: 16 U/L (ref 6–29)
AST: 20 U/L (ref 10–35)
Albumin: 3.8 g/dL (ref 3.6–5.1)
Alkaline phosphatase (APISO): 79 U/L (ref 37–153)
BUN: 14 mg/dL (ref 7–25)
CO2: 32 mmol/L (ref 20–32)
Calcium: 9.5 mg/dL (ref 8.6–10.4)
Chloride: 104 mmol/L (ref 98–110)
Creat: 0.89 mg/dL (ref 0.50–0.99)
GFR, Est African American: 80 mL/min/{1.73_m2} (ref >=60)
GFR, Est Non African American: 69 mL/min/{1.73_m2} (ref >=60)
GLOBULIN: 2.6 g/dL (ref 1.9–3.7)
Glucose, Bld: 137 mg/dL — ABNORMAL HIGH (ref 65–99)
Potassium: 4.4 mmol/L (ref 3.5–5.3)
Sodium: 143 mmol/L (ref 135–146)
TOTAL PROTEIN: 6.4 g/dL (ref 6.1–8.1)
Total Bilirubin: 0.5 mg/dL (ref 0.2–1.2)

## 2018-08-16 LAB — LIPID PANEL
Cholesterol: 246 mg/dL — ABNORMAL HIGH (ref <200)
HDL: 49 mg/dL — ABNORMAL LOW (ref >=50)
LDL CHOLESTEROL (CALC): 162 mg/dL — AB
Non-HDL Cholesterol (Calc): 197 mg/dL (calc) — ABNORMAL HIGH (ref <130)
Total CHOL/HDL Ratio: 5 (calc) — ABNORMAL HIGH (ref <5.0)
Triglycerides: 193 mg/dL — ABNORMAL HIGH (ref <150)

## 2018-08-16 LAB — HEMOGLOBIN A1C
Hgb A1c MFr Bld: 7.1 % of total Hgb — ABNORMAL HIGH (ref <5.7)
Mean Plasma Glucose: 157 (calc)
eAG (mmol/L): 8.7 (calc)

## 2018-08-16 LAB — TSH: TSH: 3.06 mIU/L (ref 0.40–4.50)

## 2018-08-16 MED ORDER — ROSUVASTATIN CALCIUM 40 MG PO TABS: 40.0000 mg | ORAL_TABLET | Freq: Every day | ORAL | 0 refills | Status: DC

## 2018-08-16 NOTE — Telephone Encounter (Signed)
Crestor 40mg  po qd entered per MD

## 2018-08-21 DIAGNOSIS — F322 Major depressive disorder, single episode, severe without psychotic features: Secondary | ICD-10-CM | POA: Insufficient documentation

## 2018-08-21 NOTE — Assessment & Plan Note (Signed)
Controlled, no change in medication DASH diet and commitment to daily physical activity for a minimum of 30 minutes discussed and encouraged, as a part of hypertension management. The importance of attaining a healthy weight is also discussed.  BP/Weight 08/15/2018 06/22/2018 06/02/2018 05/11/2018 04/12/2018 04/11/2018 4/80/1655  Systolic BP 374 827 078 675 449 201 007  Diastolic BP 88 70 76 81 71 74 80  Wt. (Lbs) 290 283 282.4 287 286 288.12 288  BMI 44.09 43.03 42.94 43.64 43.49 43.81 43.79

## 2018-08-21 NOTE — Assessment & Plan Note (Signed)
  Patient re-educated about  the importance of commitment to a  minimum of 150 minutes of exercise per week as able.  The importance of healthy food choices with portion control discussed, as well as eating regularly and within a 12 hour window most days. The need to choose "clean , green" food 50 to 75% of the time is discussed, as well as to make water the primary drink and set a goal of 64 ounces water daily.    Weight /BMI 08/15/2018 06/22/2018 06/02/2018  WEIGHT 290 lb 283 lb 282 lb 6.4 oz  HEIGHT 5\' 8"  5\' 8"  5\' 8"   BMI 44.09 kg/m2 43.03 kg/m2 42.94 kg/m2

## 2018-08-21 NOTE — Assessment & Plan Note (Signed)
Start effexor , refusing therapy states not covered, not suicidal or homicidal

## 2018-08-22 ENCOUNTER — Telehealth: Payer: Self-pay | Admitting: Family Medicine

## 2018-08-22 ENCOUNTER — Other Ambulatory Visit: Payer: Self-pay | Admitting: Family Medicine

## 2018-08-22 DIAGNOSIS — M5090 Cervical disc disorder, unspecified, unspecified cervical region: Secondary | ICD-10-CM

## 2018-08-22 NOTE — Telephone Encounter (Signed)
Please see patient concern/request

## 2018-08-22 NOTE — Telephone Encounter (Signed)
I have ordered the MRI of t her neck pls arrange and let her know, she has ahd abn C spine MRI in the past

## 2018-08-22 NOTE — Telephone Encounter (Signed)
Pt is calling to state that the shot\steriod pack did not work. She is requesting the MRI

## 2018-08-28 NOTE — Telephone Encounter (Signed)
Pt scheduled and aware

## 2018-08-29 ENCOUNTER — Ambulatory Visit: Payer: Self-pay | Admitting: Family Medicine

## 2018-09-04 ENCOUNTER — Other Ambulatory Visit: Payer: Self-pay

## 2018-09-04 ENCOUNTER — Ambulatory Visit (HOSPITAL_COMMUNITY)
Admission: RE | Admit: 2018-09-04 | Discharge: 2018-09-04 | Disposition: A | Discharge status: Home / Self Care | Payer: Medicare Other | Source: Ambulatory Visit | Attending: Family Medicine | Admitting: Family Medicine

## 2018-09-04 DIAGNOSIS — M542 Cervicalgia: Secondary | ICD-10-CM | POA: Diagnosis not present

## 2018-09-04 DIAGNOSIS — M5090 Cervical disc disorder, unspecified, unspecified cervical region: Secondary | ICD-10-CM

## 2018-09-05 ENCOUNTER — Telehealth: Payer: Self-pay | Admitting: Family Medicine

## 2018-09-05 NOTE — Telephone Encounter (Signed)
PT LVM that someone called, and she is returning the call

## 2018-09-07 ENCOUNTER — Telehealth: Payer: Self-pay

## 2018-09-07 DIAGNOSIS — M5090 Cervical disc disorder, unspecified, unspecified cervical region: Secondary | ICD-10-CM

## 2018-09-07 NOTE — Telephone Encounter (Signed)
-----   Message from Fayrene Helper, MD sent at 09/05/2018 10:13 AM EDT ----- Please let jher know MRI shows worsening arthritis in neck, I recommend pain clinic eval, nay beenfit from eoidural inction and that option is just to refer to the radiology dept, let me know how this is going please

## 2018-09-07 NOTE — Telephone Encounter (Signed)
Pt aware of results 

## 2018-09-18 ENCOUNTER — Other Ambulatory Visit: Payer: Self-pay | Admitting: Family Medicine

## 2018-09-28 DIAGNOSIS — H524 Presbyopia: Secondary | ICD-10-CM | POA: Diagnosis not present

## 2018-09-28 DIAGNOSIS — E119 Type 2 diabetes mellitus without complications: Secondary | ICD-10-CM | POA: Diagnosis not present

## 2018-09-28 DIAGNOSIS — H2513 Age-related nuclear cataract, bilateral: Secondary | ICD-10-CM | POA: Diagnosis not present

## 2018-09-28 DIAGNOSIS — Z7984 Long term (current) use of oral hypoglycemic drugs: Secondary | ICD-10-CM | POA: Diagnosis not present

## 2018-09-28 LAB — HM DIABETES EYE EXAM

## 2018-10-16 ENCOUNTER — Other Ambulatory Visit: Payer: Self-pay | Admitting: Family Medicine

## 2018-10-20 ENCOUNTER — Other Ambulatory Visit: Payer: Self-pay | Admitting: Family Medicine

## 2018-11-16 ENCOUNTER — Other Ambulatory Visit: Payer: Self-pay | Admitting: Family Medicine

## 2018-11-29 DIAGNOSIS — M47816 Spondylosis without myelopathy or radiculopathy, lumbar region: Secondary | ICD-10-CM | POA: Diagnosis not present

## 2018-11-29 DIAGNOSIS — M47812 Spondylosis without myelopathy or radiculopathy, cervical region: Secondary | ICD-10-CM | POA: Diagnosis not present

## 2018-11-29 DIAGNOSIS — M150 Primary generalized (osteo)arthritis: Secondary | ICD-10-CM | POA: Diagnosis not present

## 2018-11-29 DIAGNOSIS — G894 Chronic pain syndrome: Secondary | ICD-10-CM | POA: Diagnosis not present

## 2018-12-07 ENCOUNTER — Telehealth: Payer: Self-pay

## 2018-12-07 DIAGNOSIS — E1040 Type 1 diabetes mellitus with diabetic neuropathy, unspecified: Secondary | ICD-10-CM | POA: Diagnosis not present

## 2018-12-07 DIAGNOSIS — E785 Hyperlipidemia, unspecified: Secondary | ICD-10-CM | POA: Diagnosis not present

## 2018-12-07 DIAGNOSIS — E559 Vitamin D deficiency, unspecified: Secondary | ICD-10-CM | POA: Diagnosis not present

## 2018-12-07 DIAGNOSIS — E038 Other specified hypothyroidism: Secondary | ICD-10-CM

## 2018-12-07 DIAGNOSIS — I10 Essential (primary) hypertension: Secondary | ICD-10-CM | POA: Diagnosis not present

## 2018-12-07 NOTE — Telephone Encounter (Signed)
Labs ordered.

## 2018-12-08 ENCOUNTER — Encounter: Payer: Self-pay | Admitting: Family Medicine

## 2018-12-08 LAB — COMPLETE METABOLIC PANEL WITH GFR
AG Ratio: 1.3 (calc) (ref 1.0–2.5)
ALT: 13 U/L (ref 6–29)
AST: 16 U/L (ref 10–35)
Albumin: 3.9 g/dL (ref 3.6–5.1)
Alkaline phosphatase (APISO): 79 U/L (ref 37–153)
BUN/Creatinine Ratio: 11 (calc) (ref 6–22)
BUN: 12 mg/dL (ref 7–25)
CO2: 26 mmol/L (ref 20–32)
Calcium: 9.7 mg/dL (ref 8.6–10.4)
Chloride: 100 mmol/L (ref 98–110)
Creat: 1.06 mg/dL — ABNORMAL HIGH (ref 0.50–0.99)
GFR, Est African American: 65 mL/min/{1.73_m2} (ref >=60)
GFR, Est Non African American: 56 mL/min/{1.73_m2} — ABNORMAL LOW (ref >=60)
Globulin: 2.9 g/dL (calc) (ref 1.9–3.7)
Glucose, Bld: 130 mg/dL — ABNORMAL HIGH (ref 65–99)
Potassium: 4.5 mmol/L (ref 3.5–5.3)
Sodium: 139 mmol/L (ref 135–146)
Total Bilirubin: 0.5 mg/dL (ref 0.2–1.2)
Total Protein: 6.8 g/dL (ref 6.1–8.1)

## 2018-12-08 LAB — HEMOGLOBIN A1C
Hgb A1c MFr Bld: 7 % of total Hgb — ABNORMAL HIGH (ref <5.7)
Mean Plasma Glucose: 154 (calc)
eAG (mmol/L): 8.5 (calc)

## 2018-12-08 LAB — LIPID PANEL
Cholesterol: 169 mg/dL (ref <200)
HDL: 43 mg/dL — ABNORMAL LOW (ref >=50)
LDL Cholesterol (Calc): 96 mg/dL (calc)
Non-HDL Cholesterol (Calc): 126 mg/dL (calc) (ref <130)
Total CHOL/HDL Ratio: 3.9 (calc) (ref <5.0)
Triglycerides: 209 mg/dL — ABNORMAL HIGH (ref <150)

## 2018-12-08 LAB — CBC
HCT: 41.3 % (ref 35.0–45.0)
Hemoglobin: 13.7 g/dL (ref 11.7–15.5)
MCH: 30.9 pg (ref 27.0–33.0)
MCHC: 33.2 g/dL (ref 32.0–36.0)
MCV: 93 fL (ref 80.0–100.0)
MPV: 10.1 fL (ref 7.5–12.5)
Platelets: 403 10*3/uL — ABNORMAL HIGH (ref 140–400)
RBC: 4.44 10*6/uL (ref 3.80–5.10)
RDW: 12.6 % (ref 11.0–15.0)
WBC: 9.5 10*3/uL (ref 3.8–10.8)

## 2018-12-08 LAB — VITAMIN D 25 HYDROXY (VIT D DEFICIENCY, FRACTURES): Vit D, 25-Hydroxy: 31 ng/mL (ref 30–100)

## 2018-12-13 ENCOUNTER — Other Ambulatory Visit: Payer: Self-pay

## 2018-12-13 ENCOUNTER — Encounter: Payer: Self-pay | Admitting: Family Medicine

## 2018-12-13 ENCOUNTER — Ambulatory Visit (INDEPENDENT_AMBULATORY_CARE_PROVIDER_SITE_OTHER): Payer: Medicare Other | Admitting: Family Medicine

## 2018-12-13 ENCOUNTER — Other Ambulatory Visit (HOSPITAL_COMMUNITY)
Admission: RE | Admit: 2018-12-13 | Discharge: 2018-12-13 | Disposition: A | Discharge status: Home / Self Care | Payer: Medicare Other | Source: Ambulatory Visit | Attending: Family Medicine | Admitting: Family Medicine

## 2018-12-13 VITALS — BP 122/84 | HR 81 | Temp 97.0°F | Resp 15 | Ht 68.0 in | Wt 288.0 lb

## 2018-12-13 DIAGNOSIS — Z23 Encounter for immunization: Secondary | ICD-10-CM

## 2018-12-13 DIAGNOSIS — Z0000 Encounter for general adult medical examination without abnormal findings: Secondary | ICD-10-CM

## 2018-12-13 DIAGNOSIS — Z90710 Acquired absence of both cervix and uterus: Secondary | ICD-10-CM | POA: Insufficient documentation

## 2018-12-13 DIAGNOSIS — Z0001 Encounter for general adult medical examination with abnormal findings: Secondary | ICD-10-CM | POA: Diagnosis not present

## 2018-12-13 DIAGNOSIS — E1159 Type 2 diabetes mellitus with other circulatory complications: Secondary | ICD-10-CM

## 2018-12-13 DIAGNOSIS — Z124 Encounter for screening for malignant neoplasm of cervix: Secondary | ICD-10-CM | POA: Insufficient documentation

## 2018-12-13 MED ORDER — BENAZEPRIL HCL 5 MG PO TABS: ORAL_TABLET | ORAL | 1 refills | Status: DC

## 2018-12-13 MED ORDER — GABAPENTIN 300 MG PO CAPS: 300.0000 mg | ORAL_CAPSULE | Freq: Two times a day (BID) | ORAL | 3 refills | Status: DC

## 2018-12-13 NOTE — Assessment & Plan Note (Signed)
Controlled, no change in medication [name] is reminded of the importance of commitment to daily physical activity for 30 minutes or more, as able and the need to limit carbohydrate intake to 30 to 60 grams per meal to help with blood sugar control.   The need to take medication as prescribed, test blood sugar as directed, and to call between visits if there is a concern that blood sugar is uncontrolled is also discussed.   [name] is reminded of the importance of daily foot exam, annual eye examination, and good blood sugar, blood pressure and cholesterol control.  Diabetic Labs Latest Ref Rng & Units 12/07/2018 08/15/2018 04/07/2018 12/05/2017 07/21/2017  HbA1c <5.7 % of total Hgb 7.0(H) 7.1(H) 7.4(H) 7.3(H) -  Microalbumin Not Estab. ug/mL - - - - -  Micro/Creat Ratio 0.0 - 30.0 mg/g creat - - - - -  Chol <200 mg/dL 169 246(H) 213(H) 234(H) 158  HDL > OR = 50 mg/dL 43(L) 49(L) 52 45(L) 54  Calc LDL mg/dL (calc) 96 162(H) 135(H) 157(H) 82  Triglycerides <150 mg/dL 209(H) 193(H) 132 186(H) 127  Creatinine 0.50 - 0.99 mg/dL 1.06(H) 0.89 0.86 0.87 0.92   BP/Weight 12/13/2018 08/15/2018 06/22/2018 06/02/2018 05/11/2018 04/12/2018 [id number]  Systolic BP 123XX123 [id number] [id number] [id number]  Diastolic BP 84 88 70 76 81 71 74  Wt. (Lbs) 288 290 283 282.4 287 286 288.12  BMI 43.79 44.09 43.03 42.94 43.64 43.49 43.81   Foot/eye exam completion dates Latest Ref Rng & Units 12/13/2018 09/28/2018  Eye Exam No Retinopathy - No Retinopathy  Foot Form Completion - Done -

## 2018-12-13 NOTE — Progress Notes (Signed)
[name]     MRN: [id number]      DOB: 1955-12-03  HPI: Patient is in for annual physical exam. Being treated at pain clinic for neck and right arm pain, may be getting epidiural injections.  Immunization is reviewed , and  updated if needed.   PE: BP 122/84   Pulse 81   Temp (!) 97 F (36.1 C) (Temporal)   Resp 15   Ht 5\' 8"  (1.727 m)   Wt 288 lb (130.6 kg)   SpO2 99%   BMI 43.79 kg/m   Pleasant  female, alert and oriented x 3, in no cardio-pulmonary distress. Afebrile. HEENT No facial trauma or asymetry. Sinuses non tender.  Extra occullar muscles intact.. External ears normal, . Neck: decreased ROM, no adenopathy,JVD or thyromegaly.No bruits.  Chest: Clear to ascultation bilaterally.No crackles or wheezes. Non tender to palpation  Breast: No asymetry,no masses or lumps. No tenderness. No nipple discharge or inversion. No axillary or supraclavicular adenopathy  Cardiovascular system; Heart sounds normal,  S1 and  S2 ,no S3.  No murmur, or thrill. Apical beat not displaced Peripheral pulses normal.  Abdomen: Soft, non tender, no organomegaly or masses. No bruits. Bowel sounds normal. No guarding, tenderness or rebound.   GU: External genitalia normal female genitalia , normal female distribution of hair. No lesions. Urethral meatus normal in size, no  Prolapse, no lesions visibly  Present. Bladder non tender. Vagina pink and moist , with no visible lesions , discharge present . Adequate pelvic support no  cystocele or rectocele noted  Uterus absent, no adnexal masses, no  adnexal tenderness.   Musculoskeletal exam: Decreased  ROM of spine, hips , shoulders and knees.  deformity ,swelling and  crepitus noted. No muscle wasting or atrophy.   Neurologic: Cranial nerves 2 to 12 intact. Power, tone , normal throughout. Disturbance in gait. No tremor.  Skin: Intact, no ulceration, erythema , scaling or rash noted. Pigmentation normal  throughout  Psych; Normal mood and affect. Judgement and concentration normal   Assessment & Plan:  Annual physical exam Annual exam as documented. Counseling done  re healthy lifestyle involving commitment to 150 minutes exercise per week, heart healthy diet, and attaining healthy weight.The importance of adequate sleep also discussed. Regular seat belt use and home safety, is also discussed. Changes in health habits are decided on by the patient with goals and time frames  set for achieving them. Immunization and cancer screening needs are specifically addressed at this visit.   Type 1 diabetes mellitus with diabetic neuropathy, unspecified (HCC) Controlled, no change in medication [name] is reminded of the importance of commitment to daily physical activity for 30 minutes or more, as able and the need to limit carbohydrate intake to 30 to 60 grams per meal to help with blood sugar control.   The need to take medication as prescribed, test blood sugar as directed, and to call between visits if there is a concern that blood sugar is uncontrolled is also discussed.   [name] is reminded of the importance of daily foot exam, annual eye examination, and good blood sugar, blood pressure and cholesterol control.  Diabetic Labs Latest Ref Rng & Units 12/07/2018 08/15/2018 04/07/2018 12/05/2017 07/21/2017  HbA1c <5.7 % of total Hgb 7.0(H) 7.1(H) 7.4(H) 7.3(H) -  Microalbumin Not Estab. ug/mL - - - - -  Micro/Creat Ratio 0.0 - 30.0 mg/g creat - - - - -  Chol <200 mg/dL 169 246(H) 213(H) 234(H) 158  HDL > OR = 50 mg/dL 43(L) 49(L) 52 45(L) 54  Calc LDL mg/dL (calc) 96 162(H) 135(H) 157(H) 82  Triglycerides <150 mg/dL 209(H) 193(H) 132 186(H) 127  Creatinine 0.50 - 0.99 mg/dL 1.06(H) 0.89 0.86 0.87 0.92   BP/Weight 12/13/2018 08/15/2018 06/22/2018 06/02/2018 05/11/2018 04/12/2018 [id number]  Systolic BP 123XX123 [id number]  Diastolic BP 84 88 70 76 81 71 74  Wt. (Lbs) 288 290 283  282.4 287 286 288.12  BMI 43.79 44.09 43.03 42.94 43.64 43.49 43.81   Foot/eye exam completion dates Latest Ref Rng & Units 12/13/2018 09/28/2018  Eye Exam No Retinopathy - No Retinopathy  Foot Form Completion - Done -

## 2018-12-13 NOTE — Patient Instructions (Addendum)
F/U in 4.5 months, call If you need me before  Pap today.  Foot exam shows reduced sensation in feet ,especially the heels, please examine daily   Flu vaccine and Tdap today  Please reduce fried and fatty foods, egg yolk, cheese, and ouils. Cholesterol is good but TG are high  Please get non fasting HBA1C, chrem 7 and EGFr and TSH and microalb  1 week before f/u  Think about what you will eat, plan ahead. Choose " clean, green, fresh or frozen" over canned, processed or packaged foods which are more sugary, salty and fatty. 70 to 75% of food eaten should be vegetables and fruit. Three meals at set times with snacks allowed between meals, but they must be fruit or vegetables. Aim to eat over a 12 hour period , example 7 am to 7 pm, and STOP after  your last meal of the day. Drink water,generally about 64 ounces per day, no other drink is as healthy. Fruit juice is best enjoyed in a healthy way, by EATING the fruit.  Thanks for choosing [facility], we consider it a privelige to serve you.

## 2018-12-13 NOTE — Assessment & Plan Note (Signed)

## 2018-12-18 ENCOUNTER — Ambulatory Visit (INDEPENDENT_AMBULATORY_CARE_PROVIDER_SITE_OTHER): Payer: Medicare Other | Admitting: Family Medicine

## 2018-12-18 ENCOUNTER — Encounter: Payer: Self-pay | Admitting: Family Medicine

## 2018-12-18 ENCOUNTER — Other Ambulatory Visit: Payer: Self-pay

## 2018-12-18 VITALS — BP 122/84 | HR 81 | Ht 68.0 in | Wt 288.0 lb

## 2018-12-18 DIAGNOSIS — Z0000 Encounter for general adult medical examination without abnormal findings: Secondary | ICD-10-CM | POA: Diagnosis not present

## 2018-12-18 LAB — CYTOLOGY - PAP
Comment: NEGATIVE
Diagnosis: NEGATIVE
High risk HPV: NEGATIVE

## 2018-12-18 NOTE — Patient Instructions (Signed)
[name] , Thank you for taking time to come for your Medicare Wellness Visit. I appreciate your ongoing commitment to your health goals. Please review the following plan we discussed and let me know if I can assist you in the future.   Please continue to practice social distancing to keep you, your family, and our community safe.  If you must go out, please wear a Mask and practice good handwashing.  We hope you have a safe, happy, and healthy Holiday Season! See you in the New Year :)   Screening recommendations/referrals: Colonoscopy: Due 2024 Mammogram: up to date Bone Density: n/a Recommended yearly ophthalmology/optometry visit for glaucoma screening and checkup Recommended yearly dental visit for hygiene and checkup  Vaccinations: Influenza vaccine: up to date Pneumococcal vaccine: up to date Tdap vaccine: up to date Shingles vaccine: completed  Advanced directives: you reported your daughter will handle this  Conditions/risks identified: Falls  Next appointment: 05/15/2019   Preventive Care 40-64 Years, Female Preventive care refers to lifestyle choices and visits with your health care provider that can promote health and wellness. What does preventive care include?  A yearly physical exam. This is also called an annual well check.  Dental exams once or twice a year.  Routine eye exams. Ask your health care provider how often you should have your eyes checked.  Personal lifestyle choices, including:  Daily care of your teeth and gums.  Regular physical activity.  Eating a healthy diet.  Avoiding tobacco and drug use.  Limiting alcohol use.  Practicing safe sex.  Taking low-dose aspirin daily starting at age 18.  Taking vitamin and mineral supplements as recommended by your health care provider. What happens during an annual well check? The services and screenings done by your health care provider during your annual well check will depend on your age,  overall health, lifestyle risk factors, and family history of disease. Counseling  Your health care provider may ask you questions about your:  Alcohol use.  Tobacco use.  Drug use.  Emotional well-being.  Home and relationship well-being.  Sexual activity.  Eating habits.  Work and work Statistician.  Method of birth control.  Menstrual cycle.  Pregnancy history. Screening  You may have the following tests or measurements:  Height, weight, and BMI.  Blood pressure.  Lipid and cholesterol levels. These may be checked every 5 years, or more frequently if you are over 65 years old.  Skin check.  Lung cancer screening. You may have this screening every year starting at age 56 if you have a 30-pack-year history of smoking and currently smoke or have quit within the past 15 years.  Fecal occult blood test (FOBT) of the stool. You may have this test every year starting at age 13.  Flexible sigmoidoscopy or colonoscopy. You may have a sigmoidoscopy every 5 years or a colonoscopy every 10 years starting at age 19.  Hepatitis C blood test.  Hepatitis B blood test.  Sexually transmitted disease (STD) testing.  Diabetes screening. This is done by checking your blood sugar (glucose) after you have not eaten for a while (fasting). You may have this done every 1-3 years.  Mammogram. This may be done every 1-2 years. Talk to your health care provider about when you should start having regular mammograms. This may depend on whether you have a family history of breast cancer.  BRCA-related cancer screening. This may be done if you have a family history of breast, ovarian, tubal, or peritoneal cancers.  Pelvic exam and Pap test. This may be done every 3 years starting at age 45. Starting at age 63, this may be done every 5 years if you have a Pap test in combination with an HPV test.  Bone density scan. This is done to screen for osteoporosis. You may have this scan if you are at  high risk for osteoporosis. Discuss your test results, treatment options, and if necessary, the need for more tests with your health care provider. Vaccines  Your health care provider may recommend certain vaccines, such as:  Influenza vaccine. This is recommended every year.  Tetanus, diphtheria, and acellular pertussis (Tdap, Td) vaccine. You may need a Td booster every 10 years.  Zoster vaccine. You may need this after age 61.  Pneumococcal 13-valent conjugate (PCV13) vaccine. You may need this if you have certain conditions and were not previously vaccinated.  Pneumococcal polysaccharide (PPSV23) vaccine. You may need one or two doses if you smoke cigarettes or if you have certain conditions. Talk to your health care provider about which screenings and vaccines you need and how often you need them. This information is not intended to replace advice given to you by your health care provider. Make sure you discuss any questions you have with your health care provider. Document Released: 02/07/2015 Document Revised: 10/01/2015 Document Reviewed: 11/12/2014 Elsevier Interactive Patient Education  2017 [employer] Prevention in the Home Falls can cause injuries. They can happen to people of all ages. There are many things you can do to make your home safe and to help prevent falls. What can I do on the outside of my home?  Regularly fix the edges of walkways and driveways and fix any cracks.  Remove anything that might make you trip as you walk through a door, such as a raised step or threshold.  Trim any bushes or trees on the path to your home.  Use bright outdoor lighting.  Clear any walking paths of anything that might make someone trip, such as rocks or tools.  Regularly check to see if handrails are loose or broken. Make sure that both sides of any steps have handrails.  Any raised decks and porches should have guardrails on the edges.  Have any leaves, snow, or  ice cleared regularly.  Use sand or salt on walking paths during winter.  Clean up any spills in your garage right away. This includes oil or grease spills. What can I do in the bathroom?  Use night lights.  Install grab bars by the toilet and in the tub and shower. Do not use towel bars as grab bars.  Use non-skid mats or decals in the tub or shower.  If you need to sit down in the shower, use a plastic, non-slip stool.  Keep the floor dry. Clean up any water that spills on the floor as soon as it happens.  Remove soap buildup in the tub or shower regularly.  Attach bath mats securely with double-sided non-slip rug tape.  Do not have throw rugs and other things on the floor that can make you trip. What can I do in the bedroom?  Use night lights.  Make sure that you have a light by your bed that is easy to reach.  Do not use any sheets or blankets that are too big for your bed. They should not hang down onto the floor.  Have a firm chair that has side arms. You can use this for  support while you get dressed.  Do not have throw rugs and other things on the floor that can make you trip. What can I do in the kitchen?  Clean up any spills right away.  Avoid walking on wet floors.  Keep items that you use a lot in easy-to-reach places.  If you need to reach something above you, use a strong step stool that has a grab bar.  Keep electrical cords out of the way.  Do not use floor polish or wax that makes floors slippery. If you must use wax, use non-skid floor wax.  Do not have throw rugs and other things on the floor that can make you trip. What can I do with my stairs?  Do not leave any items on the stairs.  Make sure that there are handrails on both sides of the stairs and use them. Fix handrails that are broken or loose. Make sure that handrails are as long as the stairways.  Check any carpeting to make sure that it is firmly attached to the stairs. Fix any carpet  that is loose or worn.  Avoid having throw rugs at the top or bottom of the stairs. If you do have throw rugs, attach them to the floor with carpet tape.  Make sure that you have a light switch at the top of the stairs and the bottom of the stairs. If you do not have them, ask someone to add them for you. What else can I do to help prevent falls?  Wear shoes that:  Do not have high heels.  Have rubber bottoms.  Are comfortable and fit you well.  Are closed at the toe. Do not wear sandals.  If you use a stepladder:  Make sure that it is fully opened. Do not climb a closed stepladder.  Make sure that both sides of the stepladder are locked into place.  Ask someone to hold it for you, if possible.  Clearly mark and make sure that you can see:  Any grab bars or handrails.  First and last steps.  Where the edge of each step is.  Use tools that help you move around (mobility aids) if they are needed. These include:  Canes.  Walkers.  Scooters.  Crutches.  Turn on the lights when you go into a dark area. Replace any light bulbs as soon as they burn out.  Set up your furniture so you have a clear path. Avoid moving your furniture around.  If any of your floors are uneven, fix them.  If there are any pets around you, be aware of where they are.  Review your medicines with your doctor. Some medicines can make you feel dizzy. This can increase your chance of falling. Ask your doctor what other things that you can do to help prevent falls. This information is not intended to replace advice given to you by your health care provider. Make sure you discuss any questions you have with your health care provider. Document Released: 11/07/2008 Document Revised: 06/19/2015 Document Reviewed: 02/15/2014 Elsevier Interactive Patient Education  2017 [employer].

## 2018-12-18 NOTE — Progress Notes (Signed)
Subjective:   [name] is a 63 y.o. female who presents for Medicare Annual (Subsequent) preventive examination.  Location of Patient: Home Location of Provider: Telehealth Consent was obtain for visit to be over via telehealth. I verified that I am speaking with the correct person using two identifiers.  Review of Systems:   Cardiac Risk Factors include: diabetes mellitus;dyslipidemia;hypertension;obesity (BMI >30kg/m2);sedentary lifestyle     Objective:     Vitals: There were no vitals taken for this visit.  There is no height or weight on file to calculate BMI.  Advanced Directives 12/18/2018 01/05/2018 08/11/2016  Does Patient Have a Medical Advance Directive? No No No  Would patient like information on creating a medical advance directive? No - Patient declined No - Patient declined Yes (ED - Information included in AVS)    Tobacco Social History   Tobacco Use  Smoking Status Never Smoker  Smokeless Tobacco Never Used     Counseling given: Not Answered   Clinical Intake:  Pre-visit preparation completed: No  Pain : No/denies pain     Nutritional Status: BMI > 30  Obese Diabetes: Yes CBG done?: No Did pt. bring in CBG monitor from home?: No  How often do you need to have someone help you when you read instructions, pamphlets, or other written materials from your doctor or pharmacy?: 1 - Never What is the last grade level you completed in school?: 14  Interpreter Needed?: No     Past Medical History:  Diagnosis Date  . Allergy   . Anemia   . Arthritis   . Cataract    forming  . Diabetes mellitus without complication ([place]) [id number]  . GERD (gastroesophageal reflux disease)   . Hyperlipidemia   . Hypertension 2011  . Morbid obesity ([place])   . Neuromuscular disorder ([place])    issues with nerves in back   . Osteopenia   . Thyroid disease    Past Surgical History:  Procedure Laterality Date  . ABDOMINAL HYSTERECTOMY    . APPENDECTOMY    .  CHOLECYSTECTOMY    . COLONOSCOPY  2009   [facility] - normal per pt   . TONSILLECTOMY     Family History  Problem Relation Age of Onset  . Asthma Mother   . Hypertension Mother   . Heart disease Mother 18       cabg  . Diabetes Father   . Hypertension Father   . Heart disease Father 73       cabg  . Cancer Sister        breast, uterine  . Cancer Maternal Grandmother   . Cancer Paternal Grandfather   . Cancer Sister   . Prostate cancer Paternal Grandmother   . Colon polyps Neg Hx   . Colon cancer Neg Hx   . Esophageal cancer Neg Hx   . Rectal cancer Neg Hx   . Stomach cancer Neg Hx    Social History   Socioeconomic History  . Marital status: Married    Spouse name: Barnie Alderman   . Number of children: 2  . Years of education: 35  . Highest education level: 12th grade  Occupational History  . Occupation: disabled   Social Needs  . Financial resource strain: Not very hard  . Food insecurity    Worry: Never true    Inability: Never true  . Transportation needs    Medical: No    Non-medical: No  Tobacco Use  . Smoking status: Never  Smoker  . Smokeless tobacco: Never Used  Substance and Sexual Activity  . Alcohol use: No  . Drug use: No  . Sexual activity: Not Currently  Lifestyle  . Physical activity    Days per week: 1 day    Minutes per session: 30 min  . Stress: Only a little  Relationships  . Social connections    Talks on phone: More than three times a week    Gets together: More than three times a week    Attends religious service: More than 4 times per year    Active member of club or organization: No    Attends meetings of clubs or organizations: Never    Relationship status: Married  Other Topics Concern  . Not on file  Social History Narrative   Lives with Husband alone     Outpatient Encounter Medications as of 12/18/2018  Medication Sig  . aspirin 81 MG tablet Take 81 mg by mouth daily.    . benazepril (LOTENSIN) 5 MG tablet TAKE TWO (2)  TABLETS BY MOUTH EVERY DAY  . betamethasone dipropionate (DIPROLENE) 0.05 % cream Apply topically 2 (two) times daily.  . calcium-vitamin D (OSCAL WITH D) 500-200 MG-UNIT per tablet Take 1 tablet by mouth daily.  . cyclobenzaprine (FLEXERIL) 5 MG tablet TAKE ONE TABLET BY MOUTH AT BEDTIME AS NEEDED. SHOULD LAST SIX MONTHS.  Marland Kitchen Dexlansoprazole (DEXILANT) 30 MG capsule Take 30 mg by mouth daily.  . furosemide (LASIX) 20 MG tablet Take on tablet once daily as needed for leg swelling  . gabapentin (NEURONTIN) 300 MG capsule Take 1 capsule (300 mg total) by mouth 2 (two) times daily.  Marland Kitchen ibuprofen (ADVIL) 800 MG tablet TAKE ONE TABLET BY MOUTH EVERY 8 HOURS AS NEEDED.  Marland Kitchen levothyroxine (SYNTHROID, LEVOTHROID) 150 MCG tablet TAKE ONE TABLET BY MOUTH DAILY.  . metFORMIN (GLUCOPHAGE) 500 MG tablet Take 1 tablet (500 mg total) by mouth 2 (two) times daily with a meal.  . oxyCODONE-acetaminophen (PERCOCET) 7.5-325 MG tablet Take one tablet once daily , as needed , for uncontrolled back pain  . oxyCODONE-acetaminophen (PERCOCET) 7.5-325 MG tablet Take one tablet  Twice daily as needed, for  uncontrolled pain  . potassium chloride SA (K-DUR) 20 MEQ tablet Take one tablet once daily as needed, on days you take furosemide for swelling  . rosuvastatin (CRESTOR) 40 MG tablet Take 1 tablet (40 mg total) by mouth daily.  . traZODone (DESYREL) 100 MG tablet TAKE ONE TABLET BY MOUTH AT BEDTIME.  Marland Kitchen venlafaxine XR (EFFEXOR XR) 37.5 MG 24 hr capsule Take 1 capsule (37.5 mg total) by mouth daily with breakfast. (Patient not taking: Reported on 12/13/2018)   Facility-Administered Encounter Medications as of 12/18/2018  Medication  . Influenza (>/= 3 years) inactive virus vaccine (FLVIRIN/FLUZONE) injection SUSP 0.5 mL  . methylPREDNISolone acetate (DEPO-MEDROL) injection 120 mg    Activities of Daily Living In your present state of health, do you have any difficulty performing the following activities: 12/18/2018  01/05/2018  Hearing? N N  Vision? N N  Difficulty concentrating or making decisions? N N  Walking or climbing stairs? N Y  Dressing or bathing? N N  Doing errands, shopping? N N  Preparing Food and eating ? - N  Using the Toilet? - N  In the past six months, have you accidently leaked urine? - N  Do you have problems with loss of bowel control? - N  Managing your Medications? - N  Managing your Finances? -  N  Housekeeping or managing your Housekeeping? - N  Some recent data might be hidden    Patient Care Team: Fayrene Helper, MD as PCP - General    Assessment:   This is a routine wellness examination for Suha.  Exercise Activities and Dietary recommendations Current Exercise Habits: Home exercise routine, Type of exercise: Other - see comments, Time (Minutes): 30(video), Frequency (Times/Week): 2, Weekly Exercise (Minutes/Week): 60, Intensity: Mild, Exercise limited by: cardiac condition(s);orthopedic condition(s);Other - see comments  Goals    . Increase physical activity    . Weight (lb) < 200 lb (90.7 kg)       Fall Risk Fall Risk  12/18/2018 08/15/2018 06/02/2018 04/11/2018 03/07/2018  Falls in the past year? 0 0 0 0 0  Number falls in past yr: 0 0 - - 0  Injury with Fall? 0 0 0 0 0  Follow up Falls evaluation completed;Education provided;Falls prevention discussed;Follow up appointment - - - -   Is the patient's home free of loose throw rugs in walkways, pet beds, electrical cords, etc?   yes      Grab bars in the bathroom? yes      Handrails on the stairs?   no      Adequate lighting?   yes   Depression Screen PHQ 2/9 Scores 12/18/2018 08/15/2018 06/02/2018 04/11/2018  PHQ - 2 Score 0 5 0 0  PHQ- 9 Score - 19 - -     Cognitive Function     6CIT Screen 01/05/2018  What Year? 0 points  What month? 0 points  What time? 0 points  Count back from 20 0 points  Months in reverse 0 points  Repeat phrase 6 points  Total Score 6    Immunization History   Administered Date(s) Administered  . Influenza Whole 12/03/2008, 10/19/2010  . Influenza,inj,Quad PF,6+ Mos 12/27/2012, 11/01/2013, 10/25/2014, 11/10/2015, 11/24/2016, 12/08/2017, 12/13/2018  . Pneumococcal Conjugate-13 04/11/2014  . Pneumococcal Polysaccharide-23 03/05/2013  . Td 12/03/2008  . Tdap 12/13/2018  . Zoster Recombinat (Shingrix) 07/21/2017, 01/05/2018    Qualifies for Shingles Vaccine? Completed   Screening Tests Health Maintenance  Topic Date Due  . PAP SMEAR-Modifier  11/10/2018  . HEMOGLOBIN A1C  06/06/2019  . OPHTHALMOLOGY EXAM  09/28/2019  . FOOT EXAM  12/13/2019  . MAMMOGRAM  06/25/2020  . COLONOSCOPY  12/20/2022  . TETANUS/TDAP  12/12/2028  . INFLUENZA VACCINE  Completed  . PNEUMOCOCCAL POLYSACCHARIDE VACCINE AGE 15-64 HIGH RISK  Completed  . Hepatitis C Screening  Completed  . HIV Screening  Completed    Cancer Screenings: Lung: Low Dose CT Chest recommended if Age 60-80 years, 30 pack-year currently smoking OR have quit w/in 15years. Patient does not qualify. Breast:  Up to date on Mammogram? Yes   Up to date of Bone Density/Dexa? N/A  Colorectal: Due 2024  Additional Screenings:  Hepatitis C Screening: completed     Plan:      1. Encounter for Medicare annual wellness exam  I have personally reviewed and noted the following in the patient's chart:   . Medical and social history . Use of alcohol, tobacco or illicit drugs  . Current medications and supplements . Functional ability and status . Nutritional status . Physical activity . Advanced directives . List of other physicians . Hospitalizations, surgeries, and ER visits in previous 12 months . Vitals . Screenings to include cognitive, depression, and falls . Referrals and appointments  In addition, I have reviewed and discussed with patient certain  preventive protocols, quality metrics, and best practice recommendations. A written personalized care plan for preventive services as well  as general preventive health recommendations were provided to patient.   I provided 20 minutes of non-face-to-face time during this encounter.  Perlie Mayo, NP  12/18/2018

## 2018-12-26 ENCOUNTER — Other Ambulatory Visit: Payer: Self-pay | Admitting: Family Medicine

## 2018-12-26 DIAGNOSIS — M5412 Radiculopathy, cervical region: Secondary | ICD-10-CM | POA: Diagnosis not present

## 2018-12-26 DIAGNOSIS — M47816 Spondylosis without myelopathy or radiculopathy, lumbar region: Secondary | ICD-10-CM | POA: Diagnosis not present

## 2018-12-26 DIAGNOSIS — M47812 Spondylosis without myelopathy or radiculopathy, cervical region: Secondary | ICD-10-CM | POA: Diagnosis not present

## 2018-12-26 DIAGNOSIS — G894 Chronic pain syndrome: Secondary | ICD-10-CM | POA: Diagnosis not present

## 2019-01-08 ENCOUNTER — Ambulatory Visit: Payer: Self-pay

## 2019-01-08 ENCOUNTER — Ambulatory Visit: Payer: Self-pay | Admitting: Family Medicine

## 2019-01-09 ENCOUNTER — Encounter: Payer: Medicare Other | Admitting: Family Medicine

## 2019-02-09 ENCOUNTER — Other Ambulatory Visit: Payer: Self-pay | Admitting: Family Medicine

## 2019-02-22 ENCOUNTER — Encounter: Payer: Self-pay | Admitting: Cardiovascular Disease

## 2019-02-22 ENCOUNTER — Telehealth (INDEPENDENT_AMBULATORY_CARE_PROVIDER_SITE_OTHER): Payer: Medicare Other | Admitting: Cardiovascular Disease

## 2019-02-22 VITALS — Ht 68.0 in | Wt 288.0 lb

## 2019-02-22 DIAGNOSIS — G478 Other sleep disorders: Secondary | ICD-10-CM

## 2019-02-22 DIAGNOSIS — Z794 Long term (current) use of insulin: Secondary | ICD-10-CM

## 2019-02-22 DIAGNOSIS — E119 Type 2 diabetes mellitus without complications: Secondary | ICD-10-CM

## 2019-02-22 DIAGNOSIS — F418 Other specified anxiety disorders: Secondary | ICD-10-CM

## 2019-02-22 DIAGNOSIS — I10 Essential (primary) hypertension: Secondary | ICD-10-CM | POA: Diagnosis not present

## 2019-02-22 DIAGNOSIS — R0789 Other chest pain: Secondary | ICD-10-CM | POA: Diagnosis not present

## 2019-02-22 DIAGNOSIS — I519 Heart disease, unspecified: Secondary | ICD-10-CM | POA: Diagnosis not present

## 2019-02-22 DIAGNOSIS — I5189 Other ill-defined heart diseases: Secondary | ICD-10-CM

## 2019-02-22 DIAGNOSIS — E039 Hypothyroidism, unspecified: Secondary | ICD-10-CM

## 2019-02-22 NOTE — Progress Notes (Signed)
Virtual Visit via Telephone Note   This visit type was conducted due to national recommendations for restrictions regarding the COVID-19 Pandemic (e.g. social distancing) in an effort to limit this patient's exposure and mitigate transmission in our community.  Due to her co-morbid illnesses, this patient is at least at moderate risk for complications without adequate follow up.  This format is felt to be most appropriate for this patient at this time.  The patient did not have access to video technology/had technical difficulties with video requiring transitioning to audio format only (telephone).  All issues noted in this document were discussed and addressed.  No physical exam could be performed with this format.  Please refer to the patient's chart for her  consent to telehealth for [facility].   Date:  02/22/2019   ID:  [name], [name] Aug 24, 1955, MRN [id number]  Patient Location: Home Provider Location: Home  PCP:  Fayrene Helper, MD  Cardiologist:  Shelva Majestic, MD Electrophysiologist:  None   Evaluation Performed:  Follow-Up Visit  Chief Complaint:  20 month F/U  History of Present Illness:    [name] is a 64 y.o. female who has a history of hypertension, and type 2 diabetes mellitus in addition to GERD.  She had presented to the office on 11/13/2015 and was seen by Rosaria Ferries, PA-C, as well as myself.  At that time, she was under significant amount of stress secondary to family issues, and also had experienced some chest pain radiating to the right side of her neck which was not exertionally precipitated. She has a significant family history for CAD and was morbidly obese.  She subsequently underwent an echo Doppler study  on 12/03/2015 which showed an ejection fraction at 50-55% with grade 1 diastolic dysfunction.  There was mitral annular calcification with trivial MR.  She had normal pulmonary pressures.  A nuclear perfusion study was low risk and showed  normal perfusion and function.  Laboratory had revealed elevation of her lipid studies with a total cholesterol 204, triglycerides 158, and LDL 131.  She was started on atorvastatin 40 mg.  She has tolerated this well.    When I saw her, she admitted to very poor sleep.  She has frequent awakenings, her sleep is nonrestorative, she notes daytime fatigue, and she snores.  He denies any exertional precipitation of chest pain.  She admits to occasional panic attacks and her heart rate increases.  I felt she had symptoms highly suggestive of obstructive sleep apnea and recommended that she undergo a sleep study for further evaluation.  Apparently, her insurance company which is [employer] denied her sleep evaluation.    I last saw her in May 2018 at which time she continues to sleep poorly.  She was having occasional panic attacks and had noticed her heart rate increasing.  She continued to be under a lot of stress.  She continued to have nocturia 3 times per night, her sleep is nonrestorative, she snores.    She ultimately underwent a sleep study on August 11, 2016.  This revealed increased upper airway resistance syndrome (U ARS) with an AHI of 3.6/h, although her RDI was increased at 15.1/h.  She had reduced sleep efficiency at 63.4%.  There was mild oxygen desaturation to a nadir of 87%.  There was abnormal sleep architecture with absence of slow-wave sleep, reduction of REM sleep and prolonged latency to REM sleep development.  She did not meet CPAP criteria  at that time.  Over the last several years, she has been followed by Dr. Moshe Cipro who has been checking her laboratory frequently.  She believe her stress level is significantly improved since her husband left and she is now living by herself.  She gained significant weight with Covid but not exercising but she plans now to start walking daily.  Her sleep has significantly improved with her reduction in stress level.  She denies chest  pain.  She denies palpitations.  Recent laboratory from December 07, 2018 showed a total cholesterol 169, LDL cholesterol 96 and triglycerides 209 with HDL 43.  She presents for telemedicine evaluation. The patient does not have symptoms concerning for COVID-19 infection (fever, chills, cough, or new shortness of breath).    Past Medical History:  Diagnosis Date  . Allergy   . Anemia   . Arthritis   . Cataract    forming  . Diabetes mellitus without complication ([place]) [id number]  . GERD (gastroesophageal reflux disease)   . Hyperlipidemia   . Hypertension 2011  . Morbid obesity ([place])   . Neuromuscular disorder ([place])    issues with nerves in back   . Osteopenia   . Thyroid disease    Past Surgical History:  Procedure Laterality Date  . ABDOMINAL HYSTERECTOMY    . APPENDECTOMY    . CHOLECYSTECTOMY    . COLONOSCOPY  2009   [facility] - normal per pt   . TONSILLECTOMY       Current Meds  Medication Sig  . aspirin 81 MG tablet Take 81 mg by mouth daily.    . benazepril (LOTENSIN) 5 MG tablet TAKE TWO (2) TABLETS BY MOUTH EVERY DAY  . calcium-vitamin D (OSCAL WITH D) 500-200 MG-UNIT per tablet Take 1 tablet by mouth daily.  . cyclobenzaprine (FLEXERIL) 5 MG tablet TAKE ONE TABLET BY MOUTH AT BEDTIME AS NEEDED. SHOULD LAST SIX MONTHS.  Marland Kitchen Dexlansoprazole (DEXILANT) 30 MG capsule Take 30 mg by mouth daily.  . furosemide (LASIX) 20 MG tablet Take 20 mg by mouth as needed.  . gabapentin (NEURONTIN) 300 MG capsule Take 1 capsule (300 mg total) by mouth 2 (two) times daily.  Marland Kitchen ibuprofen (ADVIL) 800 MG tablet TAKE ONE TABLET BY MOUTH EVERY 8 HOURS AS NEEDED.  Marland Kitchen levothyroxine (SYNTHROID, LEVOTHROID) 150 MCG tablet TAKE ONE TABLET BY MOUTH DAILY.  . metFORMIN (GLUCOPHAGE) 500 MG tablet TAKE ONE TABLET BY MOUTH TWICE DAILY. TAKE WITH A MEAL.  Marland Kitchen oxyCODONE-acetaminophen (PERCOCET) 7.5-325 MG tablet Take one tablet once daily , as needed , for uncontrolled back pain  . potassium chloride SA  (K-DUR) 20 MEQ tablet Take one tablet once daily as needed, on days you take furosemide for swelling  . rosuvastatin (CRESTOR) 40 MG tablet Take 1 tablet (40 mg total) by mouth daily.  . traZODone (DESYREL) 100 MG tablet TAKE ONE TABLET BY MOUTH AT BEDTIME.   Current Facility-Administered Medications for the 02/22/19 encounter (Telemedicine) with Troy Sine, MD  Medication  . Influenza (>/= 3 years) inactive virus vaccine (FLVIRIN/FLUZONE) injection SUSP 0.5 mL  . methylPREDNISolone acetate (DEPO-MEDROL) injection 120 mg     Allergies:   Naproxen sodium, Tsh [thyrotropin], and Statins   Social History   Tobacco Use  . Smoking status: Never Smoker  . Smokeless tobacco: Never Used  Substance Use Topics  . Alcohol use: No  . Drug use: No     Family Hx: The patient's family history includes Asthma in her mother; Cancer in her  maternal grandmother, paternal grandfather, sister, and sister; Diabetes in her father; Heart disease (age of onset: 48) in her mother; Heart disease (age of onset: 45) in her father; Hypertension in her father and mother; Prostate cancer in her paternal grandmother. There is no history of Colon polyps, Colon cancer, Esophageal cancer, Rectal cancer, or Stomach cancer.  ROS:   Please see the history of present illness.    Positive for morbid obesity No fevers chills night sweats No cough No wheezing She denies recent palpitations or chest pain She admits to low back and leg discomfort Positive for peripheral neuropathy Positive for hypothyroidism All other systems reviewed and are negative.   Prior CV studies:   The following studies were reviewed today:  NUCLEAR STUDY: 11/28/2015 Study Highlights   The left ventricular ejection fraction is normal (55-65%).  Nuclear stress EF: 55%.  There was no ST segment deviation noted during stress.  The study is normal.  This is a low risk study.     ------------------------------------------------------------------- ECHO Study Conclusions: 12/12/2015  - Left ventricle: The cavity size was normal. Wall thickness was   normal. Systolic function was low normal to mildly reduced. The   estimated ejection fraction was in the range of 50% to 55%. Wall   motion was normal; there were no regional wall motion   abnormalities. Doppler parameters are consistent with abnormal   left ventricular relaxation (grade 1 diastolic dysfunction). - Aortic valve: There was no stenosis. - Mitral valve: Mildly calcified annulus. There was trivial   regurgitation. - Right ventricle: The cavity size was normal. Systolic function   was normal. - Tricuspid valve: Peak RV-RA gradient (S): 16 mm Hg. - Pulmonary arteries: PA peak pressure: 19 mm Hg (S). - Inferior vena cava: The vessel was normal in size. The   respirophasic diameter changes were in the normal range (>= 50%),   consistent with normal central venous pressure.  Impressions:  - Normal LV size with EF 50-55%, low normal to mildly reduced   systolic function. Normal RV size and systolic function. No   significant valvular abnormalities.   SLEEP STUDY  08/11/2016 IMPRESSIONS Increased upper airway resistance syndrome (UARS) , with an AHI of 3.6 per hour and increased RDI at 15.1/h. No significant central sleep apnea occurred during this study (CAI = 0.0/h). Mild oxygen desaturation to a nadir of 87%. Reduced sleep efficiency at only 63.4%. Abnormal sleep architecture with absence of slow-wave sleep, reduction in in REM sleep, and prolonged latency to in REM sleep The patient snored with Moderate snoring volume. No significant cardiac abnormalities were noted during this study. Moderate periodic limb movements of sleep occurred during the study. No significant associated arousals.  DIAGNOSIS Sleep apnea, unspecified type, G47.30 Periodic limb movement of  sleep  RECOMMENDATIONS At present, patient does not meet criteria for CPAP therapy. Efforts should be made to optimize nasal and oral pharyngeal patency. Consider alternatives for the treatment of moderate snoring. If patient is symptomatic with restless legs, consider pharmacotherapy with a PLMS index of 34.11 Avoid alcohol, sedatives and other CNS depressants that may worsen sleep apnea and disrupt normal sleep architecture. Sleep hygiene should be reviewed to assess factors that may improve sleep quality. Weight management and regular exercise should be initiated or continued if appropriate.  [Electronically signed] 09/16/2016 10:50 AM   Labs/Other Tests and Data Reviewed:    EKG: I personally reviewed her ECG from Jun 10, 2016 which shows normal sinus rhythm at 95 bpm.  Intervals are  normal.  There are no ST segment changes  Recent Labs: 08/15/2018: TSH 3.06 12/07/2018: ALT 13; BUN 12; Creat 1.06; Hemoglobin 13.7; Platelets 403; Potassium 4.5; Sodium 139   Recent Lipid Panel Lab Results  Component Value Date/Time   CHOL 169 12/07/2018 09:05 AM   TRIG 209 (H) 12/07/2018 09:05 AM   HDL 43 (L) 12/07/2018 09:05 AM   CHOLHDL 3.9 12/07/2018 09:05 AM   LDLCALC 96 12/07/2018 09:05 AM    Wt Readings from Last 3 Encounters:  02/22/19 288 lb (130.6 kg)  12/18/18 288 lb (130.6 kg)  12/13/18 288 lb (130.6 kg)     Objective:    Vital Signs:  Ht 5\' 8"  (1.727 m)   Wt 288 lb (130.6 kg)   BMI 43.79 kg/m    This is was a telemedicine visit I could not physically examine the patient. However, the patient remains morbidly obese Breathing was normal and not labored There was no audible wheezing She denied any tenderness to her chest wall Her cardiac rhythm was regular There was no abdominal discomfort She is bothered by pain in her lower back and legs She denies significant swelling in her legs Her sleep had improved.  She denied daytime sleepiness.  There was no second  nocturia.   ASSESSMENT & PLAN:    1. Essential hypertension: Blood pressure not able to be obtained today.  However at her most recent evaluation with Dr. Moshe Cipro her blood pressure was 138/85.  She continues to be on benazepril/HCT 10 mg/12.5 since she is taking 2 tablets of a 5/6.25 pill, furosemide 20 mg as needed for swelling.  I discussed her new hypertensive guidelines with target blood pressure less than 130/80 and ideal blood pressure less than 120/80.  She sees Dr. Moshe Cipro regularly repeat blood pressure evaluation will be important to assess if medication adjustment is necessary. 2. Atypical chest pain: Essentially resolved.  At times she does note some mild neck discomfort.  A nuclear perfusion study in November 2017 was low risk and normal. 3. Grade 1 diastolic dysfunction: Echo Doppler study in May 2017 showed EF of 50 to 55% with grade 1 diastolic dysfunction.  Most likely contributed by the patient's hypertension 4. Anxiety/stress: Significantly improved since her husband has left.  Apparently he was a cocaine addict and drank excessively which created significant home stress. 5. Morbid obesity: BMI continues to be markedly elevated.  I discussed the importance of weight loss and exercise with  Heart Association recommendations of 5 days/week for at least 30 minutes of moderate intensity if at all possible. 6. Increased upper airway resistance syndrome: No significant OSA on sleep study.  However respiratory disturbance significantly increased at 50.1/h.  It was moderate snoring I discussed the importance of weight loss and exercise. 7. Hyperlipidemia: Most recent lipid panel shows an LDL of 96 despite taking rosuvastatin 40 mg.  She is diabetic with target LDL less than 70 I have recommended Zetia 10 mg to be added to her regimen if her blood is still elevated when she sees Dr. Moshe Cipro for her repeat laboratory. 8. Type 2 diabetes mellitus: Currently on Metformin.  Followed by Dr.  Moshe Cipro 9. Hypothyroidism: Currently on levothyroxine 150 mcg.   COVID-19 Education: The signs and symptoms of COVID-19 were discussed with the patient and how to seek care for testing (follow up with PCP or arrange E-visit).  The importance of social distancing was discussed today.  Time:   Today, I have spent 25 minutes with the patient with  telehealth technology discussing the above problems.     Medication Adjustments/Labs and Tests Ordered: Current medicines are reviewed at length with the patient today.  Concerns regarding medicines are outlined above.   Tests Ordered: No orders of the defined types were placed in this encounter.   Medication Changes: No orders of the defined types were placed in this encounter.   Follow Up: Since he is seeing Dr. Moshe Cipro at very regular intervals, will follow up in 1 year or sooner if problems arise.  Signed, Shelva Majestic, MD  02/22/2019 8:45 AM    [place]

## 2019-02-22 NOTE — Patient Instructions (Signed)
Medication Instructions:  NO CHANGES *If you need a refill on your cardiac medications before your next appointment, please call your pharmacy*  Follow-Up: At [facility], you and your health needs are our priority.  As part of our continuing mission to provide you with exceptional heart care, we have created designated Provider Care Teams.  These Care Teams include your primary Cardiologist (physician) and Advanced Practice Providers (APPs -  Physician Assistants and Nurse Practitioners) who all work together to provide you with the care you need, when you need it.  Your next appointment:   6 month(s)  The format for your next appointment:   In Person  Provider:   Shelva Majestic, MD

## 2019-02-26 ENCOUNTER — Other Ambulatory Visit: Payer: Self-pay | Admitting: Family Medicine

## 2019-03-24 ENCOUNTER — Other Ambulatory Visit: Payer: Self-pay | Admitting: Family Medicine

## 2019-04-06 ENCOUNTER — Other Ambulatory Visit: Payer: Self-pay

## 2019-04-06 MED ORDER — TRAZODONE HCL 100 MG PO TABS: 100.0000 mg | ORAL_TABLET | Freq: Every day | ORAL | 5 refills | Status: DC

## 2019-05-15 ENCOUNTER — Ambulatory Visit: Payer: Medicare Other | Admitting: Family Medicine

## 2019-05-28 ENCOUNTER — Other Ambulatory Visit: Payer: Self-pay | Admitting: Family Medicine

## 2019-05-28 DIAGNOSIS — Z1231 Encounter for screening mammogram for malignant neoplasm of breast: Secondary | ICD-10-CM

## 2019-05-29 ENCOUNTER — Other Ambulatory Visit: Payer: Self-pay | Admitting: Family Medicine

## 2019-06-05 ENCOUNTER — Other Ambulatory Visit: Payer: Self-pay | Admitting: Family Medicine

## 2019-06-27 ENCOUNTER — Ambulatory Visit
Admission: RE | Admit: 2019-06-27 | Discharge: 2019-06-27 | Disposition: A | Discharge status: Home / Self Care | Payer: Medicare Other | Source: Ambulatory Visit | Attending: Family Medicine | Admitting: Family Medicine

## 2019-06-27 ENCOUNTER — Other Ambulatory Visit: Payer: Self-pay

## 2019-06-27 DIAGNOSIS — Z1231 Encounter for screening mammogram for malignant neoplasm of breast: Secondary | ICD-10-CM

## 2019-07-11 ENCOUNTER — Other Ambulatory Visit: Payer: Self-pay | Admitting: Family Medicine

## 2019-08-01 ENCOUNTER — Other Ambulatory Visit: Payer: Self-pay

## 2019-08-01 ENCOUNTER — Encounter: Payer: Self-pay | Admitting: Family Medicine

## 2019-08-01 ENCOUNTER — Ambulatory Visit (INDEPENDENT_AMBULATORY_CARE_PROVIDER_SITE_OTHER): Payer: Medicare Other | Admitting: Family Medicine

## 2019-08-01 VITALS — BP 111/75 | HR 80 | Resp 16 | Ht 68.0 in | Wt 295.0 lb

## 2019-08-01 DIAGNOSIS — N491 Inflammatory disorders of spermatic cord, tunica vaginalis and vas deferens: Secondary | ICD-10-CM | POA: Insufficient documentation

## 2019-08-01 DIAGNOSIS — N750 Cyst of Bartholin's gland: Secondary | ICD-10-CM | POA: Diagnosis not present

## 2019-08-01 DIAGNOSIS — R1031 Right lower quadrant pain: Secondary | ICD-10-CM | POA: Insufficient documentation

## 2019-08-01 DIAGNOSIS — I10 Essential (primary) hypertension: Secondary | ICD-10-CM

## 2019-08-01 MED ORDER — CEPHALEXIN 500 MG PO CAPS: 500.0000 mg | ORAL_CAPSULE | Freq: Three times a day (TID) | ORAL | 0 refills | Status: AC

## 2019-08-01 MED ORDER — FLUCONAZOLE 150 MG PO TABS: 150.0000 mg | ORAL_TABLET | Freq: Once | ORAL | 0 refills | Status: AC

## 2019-08-01 MED ORDER — CEFTRIAXONE SODIUM 500 MG IJ SOLR
500.0000 mg | Freq: Once | INTRAMUSCULAR | Status: AC
  Administered 2019-08-01: 500 mg via INTRAMUSCULAR

## 2019-08-01 NOTE — Progress Notes (Signed)
   [name]     MRN: [id number]      DOB: Oct 26, 1955   HPI Ms. Seybold is here with a 2 weekh/o sore in vagina draining pus  2days ago 3 month h/o intermittent abdominal generally in RLQ, initially 3 days straight, then intermittent ROS Denies recent fever or chills. Denies sinus pressure, nasal congestion, ear pain or sore throat. Denies chest congestion, productive cough or wheezing. Denies chest pains, palpitations and leg swelling Denies  nausea, vomiting,diarrhea or constipation.   Denies dysuria, frequency, hesitancy or incontinence.  joint c/ochronic joint pain, swelling and limitation in mobility. Denies headaches, seizures, numbness, or tingling. Denies depression, anxiety or insomnia.  PE  BP 111/75   Pulse 80   Resp 16   Ht 5\' 8"  (1.727 m)   Wt 295 lb (133.8 kg)   SpO2 97%   BMI 44.85 kg/m   Patient alert and oriented and in no cardiopulmonary distress.  HEENT: No facial asymmetry, EOMI,     Neck supple .  Chest: Clear to auscultation bilaterally.  CVS: S1, S2 no murmurs, no S3.Regular rate.  ABD: Soft , RLQ tender no guarding or rebound, no organomegaly or mass palpable, however   Pelvic: tender erythematous cyst on left labia majora, approx diameter max is 4 cm, no visible purulent drainage, left inguinal adenitispresent Ext: No edema  MS: Reduced  ROM lumbar  Spine, and  hips  Skin: Intact, no ulcerations or rash noted.  Psych: Good eye contact, normal affect. Memory intact not anxious or depressed appearing.  CNS: CN 2-12 intact, power,  normal throughout.no focal deficits noted.   Assessment & Plan  Cyst of left Bartholin's gland Rocephin 500 mg IM followed by 10 day course keflex, if no response/ little change in 5 days, gyne referral, pt to call  Colicky RLQ abdominal pain 3 month h/o intermittent colicky RLQ pain, needs [place] to furhter evalute  Morbid obesity  Patient re-educated about  the importance of commitment to a  minimum of 150  minutes of exercise per week as able.  The importance of healthy food choices with portion control discussed, as well as eating regularly and within a 12 hour window most days. The need to choose "clean , green" food 50 to 75% of the time is discussed, as well as to make water the primary drink and set a goal of 64 ounces water daily.    Weight /BMI 08/01/2019 02/22/2019 12/18/2018  WEIGHT 295 lb 288 lb 288 lb  HEIGHT 5\' 8"  5\' 8"  5\' 8"   BMI 44.85 kg/m2 43.79 kg/m2 43.79 kg/m2      Hypertension goal BP (blood pressure) < 130/80 Controlled, no change in medication

## 2019-08-01 NOTE — Patient Instructions (Addendum)
Keep f/u as before, call if you need me sooner  You are treated with 10 day antbiotic , keflex for infection and also 1 f diflucan tab is prescribed in case you debvelop yeast infection  Rocephin 500mg  IM in office today If does not improve  or worsens over next 3 to 5 days, call, I will refer you to Gyne  You are referred for abdominal US , we will call with appt   Fasting labs today from previous order  Think about what you will eat, plan ahead. Choose " clean, green, fresh or frozen" over canned, processed or packaged foods which are more sugary, salty and fatty. 70 to 75% of food eaten should be vegetables and fruit. Three meals at set times with snacks allowed between meals, but they must be fruit or vegetables. Aim to eat over a 12 hour period , example 7 am to 7 pm, and STOP after  your last meal of the day. Drink water,generally about 64 ounces per day, no other drink is as healthy. Fruit juice is best enjoyed in a healthy way, by EATING the fruit. Thanks for choosing [facility], we consider it a privelige to serve you.

## 2019-08-02 ENCOUNTER — Telehealth: Payer: Self-pay | Admitting: Family Medicine

## 2019-08-02 NOTE — Telephone Encounter (Signed)
Was informed where [place] will be

## 2019-08-05 ENCOUNTER — Encounter: Payer: Self-pay | Admitting: Family Medicine

## 2019-08-05 NOTE — Assessment & Plan Note (Signed)
Controlled, no change in medication  

## 2019-08-05 NOTE — Assessment & Plan Note (Signed)
Rocephin 500 mg IM followed by 10 day course keflex, if no response/ little change in 5 days, gyne referral, pt to call

## 2019-08-05 NOTE — Assessment & Plan Note (Signed)
  Patient re-educated about  the importance of commitment to a  minimum of 150 minutes of exercise per week as able.  The importance of healthy food choices with portion control discussed, as well as eating regularly and within a 12 hour window most days. The need to choose "clean , green" food 50 to 75% of the time is discussed, as well as to make water the primary drink and set a goal of 64 ounces water daily.    Weight /BMI 08/01/2019 02/22/2019 12/18/2018  WEIGHT 295 lb 288 lb 288 lb  HEIGHT 5\' 8"  5\' 8"  5\' 8"   BMI 44.85 kg/m2 43.79 kg/m2 43.79 kg/m2

## 2019-08-05 NOTE — Assessment & Plan Note (Signed)
3 month h/o intermittent colicky RLQ pain, needs [place] to furhter evalute

## 2019-08-06 ENCOUNTER — Ambulatory Visit: Payer: Medicare Other | Admitting: Family Medicine

## 2019-08-08 ENCOUNTER — Other Ambulatory Visit: Payer: Self-pay

## 2019-08-08 ENCOUNTER — Ambulatory Visit (HOSPITAL_COMMUNITY)
Admission: RE | Admit: 2019-08-08 | Discharge: 2019-08-08 | Disposition: A | Discharge status: Home / Self Care | Payer: Medicare Other | Source: Ambulatory Visit | Attending: Family Medicine | Admitting: Family Medicine

## 2019-08-08 DIAGNOSIS — R1031 Right lower quadrant pain: Secondary | ICD-10-CM | POA: Diagnosis present

## 2019-08-14 ENCOUNTER — Encounter: Payer: Self-pay | Admitting: Family Medicine

## 2019-08-14 ENCOUNTER — Ambulatory Visit (INDEPENDENT_AMBULATORY_CARE_PROVIDER_SITE_OTHER): Payer: Medicare Other | Admitting: Family Medicine

## 2019-08-14 ENCOUNTER — Other Ambulatory Visit: Payer: Self-pay

## 2019-08-14 ENCOUNTER — Other Ambulatory Visit: Payer: Self-pay | Admitting: Family Medicine

## 2019-08-14 VITALS — BP 120/78 | HR 82 | Resp 16 | Ht 68.0 in | Wt 296.0 lb

## 2019-08-14 DIAGNOSIS — M5410 Radiculopathy, site unspecified: Secondary | ICD-10-CM

## 2019-08-14 DIAGNOSIS — E1165 Type 2 diabetes mellitus with hyperglycemia: Secondary | ICD-10-CM

## 2019-08-14 DIAGNOSIS — F322 Major depressive disorder, single episode, severe without psychotic features: Secondary | ICD-10-CM

## 2019-08-14 DIAGNOSIS — I10 Essential (primary) hypertension: Secondary | ICD-10-CM | POA: Diagnosis not present

## 2019-08-14 DIAGNOSIS — E039 Hypothyroidism, unspecified: Secondary | ICD-10-CM

## 2019-08-14 DIAGNOSIS — E785 Hyperlipidemia, unspecified: Secondary | ICD-10-CM

## 2019-08-14 MED ORDER — GLIPIZIDE ER 5 MG PO TB24: 5.0000 mg | ORAL_TABLET | Freq: Every day | ORAL | 1 refills | Status: DC

## 2019-08-14 MED ORDER — METFORMIN HCL 500 MG PO TABS: ORAL_TABLET | ORAL | 3 refills | Status: DC

## 2019-08-14 MED ORDER — BLOOD GLUCOSE METER KIT: PACK | 0 refills | Status: DC

## 2019-08-14 NOTE — Assessment & Plan Note (Signed)
Descending and managed by pain management

## 2019-08-14 NOTE — Assessment & Plan Note (Signed)
Controlled, no change in medication DASH diet and commitment to daily physical activity for a minimum of 30 minutes discussed and encouraged, as a part of hypertension management. The importance of attaining a healthy weight is also discussed.  BP/Weight 08/14/2019 08/01/2019 02/22/2019 12/18/2018 12/13/2018 08/15/2018 8/81/1031  Systolic BP 594 585 - [phone]  Diastolic BP 78 75 - 84 84 88 70  Wt. (Lbs) 296 295 288 288 288 290 283  BMI 45.01 44.85 43.79 43.79 43.79 44.09 43.03

## 2019-08-14 NOTE — Assessment & Plan Note (Signed)
Updated lab needed at/ before next visit.   

## 2019-08-14 NOTE — Assessment & Plan Note (Signed)
  Patient re-educated about  the importance of commitment to a  minimum of 150 minutes of exercise per week as able.  The importance of healthy food choices with portion control discussed, as well as eating regularly and within a 12 hour window most days. The need to choose "clean , green" food 50 to 75% of the time is discussed, as well as to make water the primary drink and set a goal of 64 ounces water daily.    Weight /BMI 08/14/2019 08/01/2019 02/22/2019  WEIGHT 296 lb 295 lb 288 lb  HEIGHT 5\' 8"  5\' 8"  5\' 8"   BMI 45.01 kg/m2 44.85 kg/m2 43.79 kg/m2

## 2019-08-14 NOTE — Progress Notes (Signed)
[name]     MRN: [id number]      DOB: 04/30/1955   HPI [name] is here for follow up and re-evaluation of chronic medical conditions, medication management and review of any available recent lab and radiology data.  Preventive health is updated, specifically  Cancer screening and Immunization.   Questions or concerns regarding consultations or procedures which the PT has had in the interim are  addressed. The PT denies any adverse reactions to current medications since the last visit.  Requests letter stating disability stas and reason  Not testing blood sugars  Denies polyuria, polydipsia, blurred vision , or hypoglycemic episodes.   ROS Denies recent fever or chills. Denies sinus pressure, nasal congestion, ear pain or sore throat. Denies chest congestion, productive cough or wheezing. Denies chest pains, palpitations and leg swelling Denies abdominal pain, nausea, vomiting,diarrhea or constipation.   Denies dysuria, frequency, hesitancy or incontinence. C/o chronic  joint pain,  and limitation in mobility.Managed by pain mangement Denies headaches, seizures, numbness, or tingling. Denies uncontrolled depression, anxiety or insomnia. Denies skin break down or rash.   PE BP 120/78   Pulse 82   Resp 16   Ht 5\' 8"  (1.727 m)   Wt 296 lb (134.3 kg)   BMI 45.01 kg/m    Patient alert and oriented and in no cardiopulmonary distress.  HEENT: No facial asymmetry, EOMI,     Neck supple .  Chest: Clear to auscultation bilaterally.  CVS: S1, S2 no murmurs, no S3.Regular rate.  ABD: Soft non tender.   Ext: No edema  YD:XAJOINOMV  ROM spine, shoulders, hips and knees.  Skin: Intact, no ulcerations or rash noted.  Psych: Good eye contact, normal affect. Memory intact not anxious or depressed appearing.  CNS: CN 2-12 intact, power,  normal throughout.no focal deficits noted.   Assessment & Plan  Hypertension goal BP (blood pressure) < 130/80 Controlled, no  change in medication DASH diet and commitment to daily physical activity for a minimum of 30 minutes discussed and encouraged, as a part of hypertension management. The importance of attaining a healthy weight is also discussed.  BP/Weight 08/14/2019 08/01/2019 02/22/2019 12/18/2018 12/13/2018 08/15/2018 6/72/0947  Systolic BP 096 283 - 662 947 654 650  Diastolic BP 78 75 - 84 84 88 70  Wt. (Lbs) 296 295 288 288 288 290 283  BMI 45.01 44.85 43.79 43.79 43.79 44.09 43.03       Type 2 diabetes mellitus with hyperglycemia (HCC) Increase Metformin to three times daily and add glipizide 5 mg daily.  Patient to connect with diabetic educator and to test every morning.  She is to contact the office weekly with her fasting blood sugar numbers.  Follow-up is in 5 to 6 weeks in office. [name] is reminded of the importance of commitment to daily physical activity for 30 minutes or more, as able and the need to limit carbohydrate intake to 30 to 60 grams per meal to help with blood sugar control.   The need to take medication as prescribed, test blood sugar as directed, and to call between visits if there is a concern that blood sugar is uncontrolled is also discussed.   [name] is reminded of the importance of daily foot exam, annual eye examination, and good blood sugar, blood pressure and cholesterol control.  Diabetic Labs Latest Ref Rng & Units 08/08/2019 12/07/2018 08/15/2018 04/07/2018 12/05/2017  HbA1c <5.7 % of total Hgb - 7.0(H) 7.1(H) 7.4(H) 7.3(H)  Microalbumin  mg/dL 0.2 - - - -  Micro/Creat Ratio <30 mcg/mg creat 6 - - - -  Chol <200 mg/dL - 169 246(H) 213(H) 234(H)  HDL > OR = 50 mg/dL - 43(L) 49(L) 52 45(L)  Calc LDL mg/dL (calc) - 96 162(H) 135(H) 157(H)  Triglycerides <150 mg/dL - 209(H) 193(H) 132 186(H)  Creatinine 0.50 - 0.99 mg/dL 1.01(H) 1.06(H) 0.89 0.86 0.87   BP/Weight 08/14/2019 08/01/2019 02/22/2019 12/18/2018 12/13/2018 08/15/2018 4/54/0981  Systolic BP 191 478 - 295 122  621 308  Diastolic BP 78 75 - 84 84 88 70  Wt. (Lbs) 296 295 288 288 288 290 283  BMI 45.01 44.85 43.79 43.79 43.79 44.09 43.03   Foot/eye exam completion dates Latest Ref Rng & Units 12/13/2018 09/28/2018  Eye Exam No Retinopathy - No Retinopathy  Foot Form Completion - Done -        Hyperlipidemia Updated lab needed at/ before next visit.   Depression, major, single episode, severe ([place]) Not at goal, medication management to be addressed at next visit. Currently stressed due to tax issue, letter provided stating she is disabled  Colicky RLQ abdominal pain Improved, [place] just shows fatty liver  Back pain with radiculopathy Descending and managed by pain management  Hypothyroidism Controlled, no change in medication DASH diet and commitment to daily physical activity for a minimum of 30 minutes discussed and encouraged, as a part of hypertension management. The importance of attaining a healthy weight is also discussed.  BP/Weight 08/14/2019 08/01/2019 02/22/2019 12/18/2018 12/13/2018 08/15/2018 6/57/8469  Systolic BP 629 528 - 413 244 010 272  Diastolic BP 78 75 - 84 84 88 70  Wt. (Lbs) 296 295 288 288 288 290 283  BMI 45.01 44.85 43.79 43.79 43.79 44.09 43.03       Morbid obesity  Patient re-educated about  the importance of commitment to a  minimum of 150 minutes of exercise per week as able.  The importance of healthy food choices with portion control discussed, as well as eating regularly and within a 12 hour window most days. The need to choose "clean , green" food 50 to 75% of the time is discussed, as well as to make water the primary drink and set a goal of 64 ounces water daily.    Weight /BMI 08/14/2019 08/01/2019 02/22/2019  WEIGHT 296 lb 295 lb 288 lb  HEIGHT 5\' 8"  5\' 8"  5\' 8"   BMI 45.01 kg/m2 44.85 kg/m2 43.79 kg/m2

## 2019-08-14 NOTE — Patient Instructions (Signed)
Follow-up in office with MD and diabetic log in the next 5 to 6 weeks call weekly with blood sugars for the next 3 weeks.  Blood sugar is uncontrolled.  New is glipizide 5 mg 1 daily with breakfast.  New is increased dose of metformin 500 mg to 1 tablet 3 times daily.  Test blood sugar every morning before breakfast.  Blood sugar ranges should be 80-1 30.  You are encouraged strongly to start exercising for 30 minutes every day.  3 to 10-minute sessions after each meal will work very well also.  You are encouraged to connect with a diabetic educator to help you on a regular basis as you change your eat eating food choices as well as habits.  Letter to be provided today to state that you are disabled on medical grounds because of chronic back pain.  For fatty liver weight loss is strongly encouraged.  Thanks for choosing [facility], we consider it a privelige to serve you.

## 2019-08-14 NOTE — Assessment & Plan Note (Signed)
Improved, [place] just shows fatty liver

## 2019-08-14 NOTE — Assessment & Plan Note (Signed)
Not at goal, medication management to be addressed at next visit. Currently stressed due to tax issue, letter provided stating she is disabled

## 2019-08-14 NOTE — Assessment & Plan Note (Signed)
Controlled, no change in medication DASH diet and commitment to daily physical activity for a minimum of 30 minutes discussed and encouraged, as a part of hypertension management. The importance of attaining a healthy weight is also discussed.  BP/Weight 08/14/2019 08/01/2019 02/22/2019 12/18/2018 12/13/2018 08/15/2018 05/05/4641  Systolic BP 142 767 - 011 003 496 116  Diastolic BP 78 75 - 84 84 88 70  Wt. (Lbs) 296 295 288 288 288 290 283  BMI 45.01 44.85 43.79 43.79 43.79 44.09 43.03

## 2019-08-14 NOTE — Assessment & Plan Note (Signed)
Increase Metformin to three times daily and add glipizide 5 mg daily.  Patient to connect with diabetic educator and to test every morning.  She is to contact the office weekly with her fasting blood sugar numbers.  Follow-up is in 5 to 6 weeks in office. [name] is reminded of the importance of commitment to daily physical activity for 30 minutes or more, as able and the need to limit carbohydrate intake to 30 to 60 grams per meal to help with blood sugar control.   The need to take medication as prescribed, test blood sugar as directed, and to call between visits if there is a concern that blood sugar is uncontrolled is also discussed.   [name] is reminded of the importance of daily foot exam, annual eye examination, and good blood sugar, blood pressure and cholesterol control.  Diabetic Labs Latest Ref Rng & Units 08/08/2019 12/07/2018 08/15/2018 04/07/2018 12/05/2017  HbA1c <5.7 % of total Hgb - 7.0(H) 7.1(H) 7.4(H) 7.3(H)  Microalbumin mg/dL 0.2 - - - -  Micro/Creat Ratio <30 mcg/mg creat 6 - - - -  Chol <200 mg/dL - 169 246(H) 213(H) 234(H)  HDL > OR = 50 mg/dL - 43(L) 49(L) 52 45(L)  Calc LDL mg/dL (calc) - 96 162(H) 135(H) 157(H)  Triglycerides <150 mg/dL - 209(H) 193(H) 132 186(H)  Creatinine 0.50 - 0.99 mg/dL 1.01(H) 1.06(H) 0.89 0.86 0.87   BP/Weight 08/14/2019 08/01/2019 02/22/2019 12/18/2018 12/13/2018 08/15/2018 2/50/0370  Systolic BP 488 891 - 694 503 888 280  Diastolic BP 78 75 - 84 84 88 70  Wt. (Lbs) 296 295 288 288 288 290 283  BMI 45.01 44.85 43.79 43.79 43.79 44.09 43.03   Foot/eye exam completion dates Latest Ref Rng & Units 12/13/2018 09/28/2018  Eye Exam No Retinopathy - No Retinopathy  Foot Form Completion - Done -

## 2019-08-15 LAB — LIPID PANEL
Cholesterol: 140 mg/dL (ref <200)
HDL: 53 mg/dL (ref >=50)
LDL Cholesterol (Calc): 66 mg/dL (calc)
Non-HDL Cholesterol (Calc): 87 mg/dL (calc) (ref <130)
Total CHOL/HDL Ratio: 2.6 (calc) (ref <5.0)
Triglycerides: 131 mg/dL (ref <150)

## 2019-08-15 LAB — BASIC METABOLIC PANEL WITH GFR
BUN/Creatinine Ratio: 21 (calc) (ref 6–22)
BUN: 21 mg/dL (ref 7–25)
CO2: 29 mmol/L (ref 20–32)
Calcium: 9.5 mg/dL (ref 8.6–10.4)
Chloride: 103 mmol/L (ref 98–110)
Creat: 1.01 mg/dL — ABNORMAL HIGH (ref 0.50–0.99)
GFR, Est African American: 68 mL/min/{1.73_m2} (ref >=60)
GFR, Est Non African American: 59 mL/min/{1.73_m2} — ABNORMAL LOW (ref >=60)
Glucose, Bld: 175 mg/dL — ABNORMAL HIGH (ref 65–99)
Potassium: 5.2 mmol/L (ref 3.5–5.3)
Sodium: 139 mmol/L (ref 135–146)

## 2019-08-15 LAB — ADD ON HEP FUNCT PNL
AG Ratio: 1.3 (calc) (ref 1.0–2.5)
ALT: 18 U/L (ref 6–29)
AST: 23 U/L (ref 10–35)
Albumin: 4 g/dL (ref 3.6–5.1)
Alkaline phosphatase (APISO): 87 U/L (ref 37–153)
Bilirubin, Direct: 0.1 mg/dL (ref 0.0–0.2)
Globulin: 3 g/dL (calc) (ref 1.9–3.7)
Indirect Bilirubin: 0.2 mg/dL (calc) (ref 0.2–1.2)
Total Bilirubin: 0.3 mg/dL (ref 0.2–1.2)
Total Protein: 7 g/dL (ref 6.1–8.1)

## 2019-08-15 LAB — TSH: TSH: 0.69 mIU/L (ref 0.40–4.50)

## 2019-08-15 LAB — TEST AUTHORIZATION

## 2019-08-15 LAB — MICROALBUMIN / CREATININE URINE RATIO
Creatinine, Urine: 34 mg/dL (ref 20–275)
Microalb Creat Ratio: 6 mcg/mg creat (ref <30)
Microalb, Ur: 0.2 mg/dL

## 2019-08-16 ENCOUNTER — Encounter: Payer: Medicare Other | Attending: Family Medicine | Admitting: Dietician

## 2019-08-16 ENCOUNTER — Encounter: Payer: Self-pay | Admitting: Dietician

## 2019-08-16 ENCOUNTER — Other Ambulatory Visit: Payer: Self-pay

## 2019-08-16 DIAGNOSIS — E1165 Type 2 diabetes mellitus with hyperglycemia: Secondary | ICD-10-CM | POA: Diagnosis present

## 2019-08-16 NOTE — Progress Notes (Signed)
Diabetes Self-Management Education  Visit Type: First/Initial  Appt. Start Time: 1045 Appt. End Time: 1150  08/20/2019  [name], identified by name and date of birth, is a 64 y.o. female with a diagnosis of Diabetes: Type 2.   ASSESSMENT  Patient is here today alone.  She would like to learn more about nutrition for diabetes.  She last had education about 15 years ago and stated that she gained weight. She just started back checking her blood sugar this am.  History includes:  Type 2 Diabetes, GERD, HLD, HTN, hypothyroid. Medications include glipizide and metformin A1C 7% 11/2018  Weight hx: 294 lbs today 303 lbs highest adult weight 140 lbs lowest adult weight (64 yo)  Patient lives alone.  Divorced.  She is on disability and worked as a Occupational psychologist and has been struggling with some depression.  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 She has not been motivated to exercise.  Was going to the [facility] prior to covid and plans to return.  Height 5\' 8"  (1.727 m), weight (!) 294 lb (133.4 kg). Body mass index is 44.7 kg/m.   Diabetes Self-Management Education - 08/16/19 1101      Visit Information   Visit Type First/Initial      Initial Visit   Diabetes Type Type 2    Are you currently following a meal plan? No    Are you taking your medications as prescribed? Yes    Date Diagnosed unsure      Health Coping   How would you rate your overall health? Fair      Psychosocial Assessment   Patient Belief/Attitude about Diabetes Afraid    Self-care barriers None    Self-management support Doctor's office    Other persons present Patient    Patient Concerns Nutrition/Meal planning    Special Needs None    Preferred Learning Style No preference indicated    Learning Readiness Ready    How often do you need to have someone help you when you read instructions, pamphlets, or other written materials from your doctor or pharmacy? 1 - Never     What is the last grade level you completed in school? 12      Pre-Education Assessment   Patient understands the diabetes disease and treatment process. Needs Instruction    Patient understands incorporating nutritional management into lifestyle. Needs Instruction    Patient undertands incorporating physical activity into lifestyle. Needs Instruction    Patient understands using medications safely. Needs Instruction    Patient understands monitoring blood glucose, interpreting and using results Needs Instruction    Patient understands prevention, detection, and treatment of acute complications. Needs Instruction    Patient understands prevention, detection, and treatment of chronic complications. Needs Instruction    Patient understands how to develop strategies to address psychosocial issues. Needs Instruction    Patient understands how to develop strategies to promote health/change behavior. Needs Instruction      Complications   Last HgB A1C per patient/outside source 7 %   11/2018   How often do you check your blood sugar? 1-2 times/day    Fasting Blood glucose range (mg/dL) >200   270 this am   Number of hyperglycemic episodes per week 21    Can you tell when your blood sugar is high? Yes    What do you do if your blood sugar is high? drinks pickle juice    Have you had a dilated  eye exam in the past 12 months? Yes    Have you had a dental exam in the past 12 months? No    Are you checking your feet? Yes    How many days per week are you checking your feet? 3      Dietary Intake   Breakfast boiled egg, 2 slices canteloupe OR sugar cereal and whole milk OR maple instant oatmeal OR egg and cheese sandwich on white with mayo and fruit or tomato   9 or skips   Snack (morning) none    Lunch breakfast food if she skipped breakfast OR tomatoes, egg sandwich OR TV dinner   1-3   Snack (afternoon) fruit    Dinner baked chicken or fish, peas, green beans, occasional sweet potato, develoed  eggs OR hominey, chicken, garlic, peppers OR spaghetti, marinara sauce    Snack (evening) Special K (100 calorie bar), fruit   bored   Beverage(s) water, regular coke, coffee with whole milk      Exercise   Exercise Type ADL's   "lazy"  poor neighborhood so does not walk   How many days per week to you exercise? 0    How many minutes per day do you exercise? 0    Total minutes per week of exercise 0      Patient Education   Previous Diabetes Education Yes (please comment)   many years ago   Disease state  Definition of diabetes, type 1 and 2, and the diagnosis of diabetes    Nutrition management  Role of diet in the treatment of diabetes and the relationship between the three main macronutrients and blood glucose level;Food label reading, portion sizes and measuring food.;Meal options for control of blood glucose level and chronic complications.;Other (comment)   mindfulness   Physical activity and exercise  Role of exercise on diabetes management, blood pressure control and cardiac health.;Helped patient identify appropriate exercises in relation to his/her diabetes, diabetes complications and other health issue.    Medications Reviewed patients medication for diabetes, action, purpose, timing of dose and side effects.    Monitoring Purpose and frequency of SMBG.;Identified appropriate SMBG and/or A1C goals.    Acute complications Taught treatment of hypoglycemia - the 15 rule.    Chronic complications Relationship between chronic complications and blood glucose control    Psychosocial adjustment Worked with patient to identify barriers to care and solutions;Identified and addressed patients feelings and concerns about diabetes      Individualized Goals (developed by patient)   Nutrition General guidelines for healthy choices and portions discussed    Physical Activity Exercise 3-5 times per week;30 minutes per day    Medications take my medication as prescribed    Monitoring  test my blood  glucose as discussed    Health Coping discuss diabetes with (comment)   MD, RD, CDCES     Post-Education Assessment   Patient understands the diabetes disease and treatment process. Demonstrates understanding / competency    Patient understands incorporating nutritional management into lifestyle. Needs Review    Patient undertands incorporating physical activity into lifestyle. Needs Review    Patient understands using medications safely. Demonstrates understanding / competency    Patient understands monitoring blood glucose, interpreting and using results Demonstrates understanding / competency    Patient understands prevention, detection, and treatment of acute complications. Demonstrates understanding / competency    Patient understands prevention, detection, and treatment of chronic complications. Demonstrates understanding / competency    Patient understands how  to develop strategies to address psychosocial issues. Needs Review    Patient understands how to develop strategies to promote health/change behavior. Needs Review      Outcomes   Expected Outcomes Demonstrated interest in learning. Expect positive outcomes    Future DMSE 4-6 wks    Program Status Not Completed           Individualized Plan for Diabetes Self-Management Training:   Learning Objective:  Patient will have a greater understanding of diabetes self-management. Patient education plan is to attend individual and/or group sessions per assessed needs and concerns.   Plan:   Patient Instructions  Consider ways to be more active.  [place]  Walking  [employer]  job on changing your beverages!  Beverages generally should have no carbohydrates.  Before snacking, ask, "Am I hungry or do I want to eat for another reason?"  Read when bored  - Consider returning to [employer]   Expected Outcomes:  Demonstrated interest in learning. Expect positive outcomes  Education material  provided: Meal plan card and Snack sheet; ADA Living with Type 2 Diabetes  If problems or questions, patient to contact team via:  Phone  Future DSME appointment: 4-6 wks

## 2019-08-16 NOTE — Patient Instructions (Addendum)
Consider ways to be more active.  [place]  Walking  [employer]  job on changing your beverages!  Beverages generally should have no carbohydrates.  Before snacking, ask, "Am I hungry or do I want to eat for another reason?"  Read when bored  - Consider returning to the [employer]

## 2019-09-06 ENCOUNTER — Other Ambulatory Visit: Payer: Self-pay | Admitting: Family Medicine

## 2019-09-17 ENCOUNTER — Encounter: Payer: Self-pay | Admitting: Cardiovascular Disease

## 2019-09-17 ENCOUNTER — Ambulatory Visit: Payer: Medicare Other | Admitting: Cardiovascular Disease

## 2019-09-17 ENCOUNTER — Other Ambulatory Visit: Payer: Self-pay

## 2019-09-17 VITALS — BP 126/78 | HR 94 | Ht 68.0 in | Wt 290.6 lb

## 2019-09-17 DIAGNOSIS — E785 Hyperlipidemia, unspecified: Secondary | ICD-10-CM | POA: Diagnosis not present

## 2019-09-17 DIAGNOSIS — I10 Essential (primary) hypertension: Secondary | ICD-10-CM | POA: Diagnosis not present

## 2019-09-17 DIAGNOSIS — R0789 Other chest pain: Secondary | ICD-10-CM

## 2019-09-17 DIAGNOSIS — Z794 Long term (current) use of insulin: Secondary | ICD-10-CM

## 2019-09-17 DIAGNOSIS — G478 Other sleep disorders: Secondary | ICD-10-CM | POA: Diagnosis not present

## 2019-09-17 DIAGNOSIS — I5189 Other ill-defined heart diseases: Secondary | ICD-10-CM

## 2019-09-17 DIAGNOSIS — E119 Type 2 diabetes mellitus without complications: Secondary | ICD-10-CM

## 2019-09-17 DIAGNOSIS — I519 Heart disease, unspecified: Secondary | ICD-10-CM | POA: Diagnosis not present

## 2019-09-17 DIAGNOSIS — E039 Hypothyroidism, unspecified: Secondary | ICD-10-CM

## 2019-09-17 NOTE — Progress Notes (Signed)
Cardiology Office Note    Date:  09/18/2019   ID:  [name], [name] 1955-08-19, MRN [id number]  PCP:  Fayrene Helper, MD  Cardiologist:  Shelva Majestic, MD   No chief complaint on file.   History of Present Illness:  [name] is a 64 y.o. female who has a history of hypertension, and type 2 diabetes mellitus in addition to GERD. She had presented to the office on 11/13/2015 and was seen by Lisa Ferries, PA-C, as well as myself. At that time, she was under significant amount of stress secondary to family issues, and also had experienced some chest pain radiating to the right side of her neck which was not exertionally precipitated. She has a significant family history for CAD and was morbidly obese. She subsequently underwent an echo Doppler study  on 12/03/2015 which showed an ejection fraction at 50-55% with grade 1 diastolic dysfunction. There was mitral annular calcification with trivial MR. She had normal pulmonary pressures. A nuclear perfusion study was low risk and showed normal perfusion and function. Laboratory had revealed elevation of her lipid studies with a total cholesterol 204, triglycerides 158, and LDL 131. She was started on atorvastatin 40 mg. She has tolerated this well.   When I saw her, she admittedto very poor sleep. She had frequent awakenings, her sleep was nonrestorative, she noted daytime fatigue, and she snores. She denied any exertional precipitation of chest pain. She had occasional panic attacks and her heart rate increases. I felt she had symptoms highly suggestive of obstructive sleep apnea and recommended that she undergo a sleep study for further evaluation. Apparently, her insurance company which is [employer] denied her sleep evaluation.   I last saw her in May 2018 at which time she continued to sleep poorly.  She was having occasional panic attacks and had noticed her heart rate increasing.  She continued  to be under a lot of stress.  She continued to have nocturia 3 times per night, her sleep is nonrestorative, she snores.   She ultimately underwent a sleep study on August 11, 2016.  This revealed increased upper airway resistance syndrome  (UARS) with an AHI of 3.6/h, although her RDI was increased at 15.1/h.  She had reduced sleep efficiency at 63.4%.  There was mild oxygen desaturation to a nadir of 87%.  There was abnormal sleep architecture with absence of slow-wave sleep, reduction of REM sleep and prolonged latency to REM sleep development.  She did not meet CPAP criteria at that time.  She was last evaluated by me in a telemedicine encounter in January 2021.  Over the previous  several years, she has been followed by Dr. Moshe Cipro who has been checking her laboratory frequently.  She believe her stress level is significantly improved since her husband left and she is now living by herself.  She gained significant weight with Covid but not exercising but she plans now to start walking daily.  Her sleep has significantly improved with her reduction in stress level.  She denies chest pain.  She denies palpitations.  Recent laboratory from December 07, 2018 showed a total cholesterol 169, LDL cholesterol 96 and triglycerides 209 with HDL 43.    Since her last evaluation, she has continued to be on benazepril 10 mg, furosemide 20 mg for hypertension and has a prescription for Cardizem 30 mg which he takes as needed with increased heart rate.  She continues to be on levothyroxine 150 mcg  for hypothyroidism.  She is diabetic on Metformin.  She continues to be on rosuvastatin 40 mg for hyperlipidemia and is on Dexilant for GERD.  She believes her stress has significantly improved since she is now separated from her husband.  She is sleeping well and denies any daytime sleepiness.  An Epworth Sleepiness Scale score was calculated in the office today and this endorsed at 3.  She presents for reevaluation.  Past  Medical History:  Diagnosis Date  . Allergy   . Anemia   . Arthritis   . Cataract    forming  . Diabetes mellitus without complication ([place]) 4580  . GERD (gastroesophageal reflux disease)   . Hyperlipidemia   . Hypertension 2011  . Morbid obesity ([place])   . Neuromuscular disorder ([place])    issues with nerves in back   . Osteopenia   . Thyroid disease     Past Surgical History:  Procedure Laterality Date  . ABDOMINAL HYSTERECTOMY    . APPENDECTOMY    . CHOLECYSTECTOMY    . COLONOSCOPY  2009   [facility] - normal per pt   . TONSILLECTOMY      Current Medications: Outpatient Medications Prior to Visit  Medication Sig Dispense Refill  . aspirin 81 MG tablet Take 81 mg by mouth daily.      . benazepril (LOTENSIN) 5 MG tablet TAKE TWO (2) TABLETS BY MOUTH EVERY DAY 180 tablet 1  . blood glucose meter kit and supplies Dispense based on patient and insurance preference. Once daily testing dx e11.9 1 each 0  . calcium-vitamin D (OSCAL WITH D) 500-200 MG-UNIT per tablet Take 1 tablet by mouth daily.    . cyclobenzaprine (FLEXERIL) 5 MG tablet TAKE ONE TABLET BY MOUTH AT BEDTIME AS NEEDED. SHOULD LAST SIX MONTHS. 30 tablet 0  . Dexlansoprazole (DEXILANT) 30 MG capsule Take 30 mg by mouth daily.    Marland Kitchen diltiazem (CARDIZEM) 30 MG tablet Take 30 mg by mouth daily. Unsure dose    . furosemide (LASIX) 20 MG tablet TAKE ONE TABLET BY MOUTH ONCE DAILY FOR LEG SWELLING. 30 tablet 5  . gabapentin (NEURONTIN) 300 MG capsule Take 1 capsule (300 mg total) by mouth 2 (two) times daily. 180 capsule 3  . glipiZIDE (GLUCOTROL XL) 5 MG 24 hr tablet Take 1 tablet (5 mg total) by mouth daily with breakfast. 90 tablet 1  . ibuprofen (ADVIL) 800 MG tablet TAKE ONE TABLET BY MOUTH EVERY 8 HOURS AS NEEDED. 30 tablet 0  . levothyroxine (SYNTHROID) 150 MCG tablet TAKE ONE TABLET BY MOUTH DAILY. 90 tablet 1  . metFORMIN (GLUCOPHAGE) 500 MG tablet Take one tablet by mouth three times daily with meals 270 tablet  3  . oxyCODONE-acetaminophen (PERCOCET) 7.5-325 MG tablet Take one tablet once daily , as needed , for uncontrolled back pain 10 tablet 0  . potassium chloride SA (KLOR-CON) 20 MEQ tablet TAKE ONE TABLET BY MOUTH ONCE DAILY AS NEEDED ON DAYS YOU TAKE FUROSEMIDE FOR SWELLING. 30 tablet 5  . rosuvastatin (CRESTOR) 40 MG tablet TAKE ONE TABLET BY MOUTH DAILY. 90 tablet 3  . traZODone (DESYREL) 100 MG tablet TAKE ONE TABLET BY MOUTH AT BEDTIME 30 tablet 1   Facility-Administered Medications Prior to Visit  Medication Dose Route Frequency Provider Last Rate Last Admin  . Influenza (>/= 3 years) inactive virus vaccine (FLVIRIN/FLUZONE) injection SUSP 0.5 mL  0.5 mL Intramuscular Once Fayrene Helper, MD      . methylPREDNISolone acetate (DEPO-MEDROL) injection  120 mg  120 mg Intramuscular Once Fayrene Helper, MD         Allergies:   Naproxen sodium, Tsh [thyrotropin], and Statins   Social History   Socioeconomic History  . Marital status: Married    Spouse name: Barnie Alderman   . Number of children: 2  . Years of education: 85  . Highest education level: 12th grade  Occupational History  . Occupation: disabled   Tobacco Use  . Smoking status: Never Smoker  . Smokeless tobacco: Never Used  Substance and Sexual Activity  . Alcohol use: No  . Drug use: No  . Sexual activity: Not Currently  Other Topics Concern  . Not on file  Social History Narrative   Lives with Husband alone    Social Determinants of Health   Financial Resource Strain:   . Difficulty of Paying Living Expenses: Not on file  Food Insecurity:   . Worried About Charity fundraiser in the Last Year: Not on file  . Ran Out of Food in the Last Year: Not on file  Transportation Needs:   . Lack of Transportation (Medical): Not on file  . Lack of Transportation (Non-Medical): Not on file  Physical Activity:   . Days of Exercise per Week: Not on file  . Minutes of Exercise per Session: Not on file  Stress:   .  Feeling of Stress : Not on file  Social Connections:   . Frequency of Communication with Friends and Family: Not on file  . Frequency of Social Gatherings with Friends and Family: Not on file  . Attends Religious Services: Not on file  . Active Member of Clubs or Organizations: Not on file  . Attends Archivist Meetings: Not on file  . Marital Status: Not on file     Family History:  The patient's family history includes Asthma in her mother; Cancer in her maternal grandmother, paternal grandfather, sister, and sister; Diabetes in her father; Heart disease (age of onset: 13) in her mother; Heart disease (age of onset: 51) in her father; Hypertension in her father and mother; Prostate cancer in her paternal grandmother.   ROS General: Negative; No fevers, chills, or night sweats;  HEENT: Negative; No changes in vision or hearing, sinus congestion, difficulty swallowing Pulmonary: Negative; No cough, wheezing, shortness of breath, hemoptysis Cardiovascular: Negative; No chest pain, presyncope, syncope, palpitations GI: Negative; No nausea, vomiting, diarrhea, or abdominal pain GU: Negative; No dysuria, hematuria, or difficulty voiding Musculoskeletal: Negative; no myalgias, joint pain, or weakness Hematologic/Oncology: Negative; no easy bruising, bleeding Endocrine: Negative; no heat/cold intolerance; no diabetes Neuro: Negative; no changes in balance, headaches Skin: Negative; No rashes or skin lesions Psychiatric: Negative; No behavioral problems, depression Sleep: Mild snoring, no daytime sleepiness, hypersomnolence, bruxism, restless legs, hypnogognic hallucinations, no cataplexy Other comprehensive 14 point system review is negative.   PHYSICAL EXAM:   VS:  BP 126/78   Pulse 94   Ht '5\' 8"'  (1.727 m)   Wt 290 lb 9.6 oz (131.8 kg)   SpO2 98%   BMI 44.19 kg/m     Repeat blood pressure by me was 122/70  Wt Readings from Last 3 Encounters:  09/17/19 290 lb 9.6 oz  (131.8 kg)  08/16/19 (!) 294 lb (133.4 kg)  08/14/19 296 lb (134.3 kg)    General: Alert, oriented, no distress.  Skin: normal turgor, no rashes, warm and dry HEENT: Normocephalic, atraumatic. Pupils equal round and reactive to light; sclera anicteric; extraocular muscles  intact;  Nose without nasal septal hypertrophy Mouth/Parynx benign; Mallinpatti scale 2/3 Neck: No JVD, no carotid bruits; normal carotid upstroke Lungs: clear to ausculatation and percussion; no wheezing or rales Chest wall: without tenderness to palpitation Heart: PMI not displaced, RRR, s1 s2 normal, 1/6 systolic murmur, no diastolic murmur, no rubs, gallops, thrills, or heaves Abdomen: soft, nontender; no hepatosplenomehaly, BS+; abdominal aorta nontender and not dilated by palpation. Back: no CVA tenderness Pulses 2+ Musculoskeletal: full range of motion, normal strength, no joint deformities Extremities: no clubbing cyanosis or edema, Homan's sign negative  Neurologic: grossly nonfocal; Cranial nerves grossly wnl Psychologic: Normal mood and affect   Studies/Labs Reviewed:   EKG:  EKG is ordered today.  ECG (independently read by me): NSR at 94; no ectopy; normal intervls  Jun 10, 2016 ECG (independently reviewed by me): Normal sinus rhythm at 95 bpm, normal intervals without ST segment changes.  Recent Labs: BMP Latest Ref Rng & Units 08/08/2019 12/07/2018 08/15/2018  Glucose 65 - 99 mg/dL 175(H) 130(H) 137(H)  BUN 7 - 25 mg/dL '21 12 14  ' Creatinine 0.50 - 0.99 mg/dL 1.01(H) 1.06(H) 0.89  BUN/Creat Ratio 6 - 22 (calc) 21 11 NOT APPLICABLE  Sodium 325 - 146 mmol/L 139 139 143  Potassium 3.5 - 5.3 mmol/L 5.2 4.5 4.4  Chloride 98 - 110 mmol/L 103 100 104  CO2 20 - 32 mmol/L 29 26 32  Calcium 8.6 - 10.4 mg/dL 9.5 9.7 9.5     Hepatic Function Latest Ref Rng & Units 08/08/2019 12/07/2018 08/15/2018  Total Protein 6.1 - 8.1 g/dL 7.0 6.8 6.4  Albumin 3.6 - 5.1 g/dL - - -  AST 10 - 35 U/L '23 16 20  ' ALT 6 - 29  U/L '18 13 16  ' Alk Phosphatase 33 - 130 U/L - - -  Total Bilirubin 0.2 - 1.2 mg/dL 0.3 0.5 0.5  Bilirubin, Direct 0.0 - 0.2 mg/dL 0.1 - -    CBC Latest Ref Rng & Units 12/07/2018 04/07/2018 04/21/2017  WBC 3.8 - 10.8 Thousand/uL 9.5 6.9 8.8  Hemoglobin 11.7 - 15.5 g/dL 13.7 12.5 13.6  Hematocrit 35 - 45 % 41.3 37.5 40.5  Platelets 140 - 400 Thousand/uL 403(H) 411(H) 376   Lab Results  Component Value Date   MCV 93.0 12/07/2018   MCV 90.8 04/07/2018   MCV 91.2 04/21/2017   Lab Results  Component Value Date   TSH 0.69 08/08/2019   Lab Results  Component Value Date   HGBA1C 7.0 (H) 12/07/2018     BNP No results found for: BNP  ProBNP No results found for: PROBNP   Lipid Panel     Component Value Date/Time   CHOL 140 08/08/2019 1100   TRIG 131 08/08/2019 1100   HDL 53 08/08/2019 1100   CHOLHDL 2.6 08/08/2019 1100   VLDL 22 03/19/2016 0933   LDLCALC 66 08/08/2019 1100     RADIOLOGY: No results found.   Additional studies/ records that were reviewed today include:   NUCLEAR STUDY: 11/28/2015 Study Highlights   The left ventricular ejection fraction is normal (55-65%).  Nuclear stress EF: 55%.  There was no ST segment deviation noted during stress.  The study is normal.  This is a low risk study.    ------------------------------------------------------------------- ECHO Study Conclusions: 12/12/2015  - Left ventricle: The cavity size was normal. Wall thickness was normal. Systolic function was low normal to mildly reduced. The estimated ejection fraction was in the range of 50% to 55%. Wall motion was  normal; there were no regional wall motion abnormalities. Doppler parameters are consistent with abnormal left ventricular relaxation (grade 1 diastolic dysfunction). - Aortic valve: There was no stenosis. - Mitral valve: Mildly calcified annulus. There was trivial regurgitation. - Right ventricle: The cavity size was normal.  Systolic function was normal. - Tricuspid valve: Peak RV-RA gradient (S): 16 mm Hg. - Pulmonary arteries: PA peak pressure: 19 mm Hg (S). - Inferior vena cava: The vessel was normal in size. The respirophasic diameter changes were in the normal range (>= 50%), consistent with normal central venous pressure.  Impressions:  - Normal LV size with EF 50-55%, low normal to mildly reduced systolic function. Normal RV size and systolic function. No significant valvular abnormalities.   SLEEP STUDY  08/11/2016 IMPRESSIONS Increased upper airway resistance syndrome(UARS), with an AHI of 3.6 per hour and increased RDI at 15.1/h. No significant central sleep apnea occurred during this study (CAI = 0.0/h). Mild oxygen desaturationto a nadir of 87%. Reduced sleep efficiency at only 63.4%. Abnormal sleep architecture with absence of slow-wave sleep, reduction in inREMsleep, and prolonged latency to in [place] The patient snored with Moderate snoring volume. Nosignificantcardiac abnormalities were noted during this study. Moderate periodic limb movements of sleep occurred during the study. No significant associated arousals.  DIAGNOSIS Sleep apnea, unspecified type, G47.30 Periodic limb movement of sleep  RECOMMENDATIONS At present, patient does not meet criteria for CPAP therapy. Efforts should be made to optimize nasal and oral pharyngeal patency. Consider alternatives for the treatment of moderate snoring. If patient is symptomatic with restless legs, consider pharmacotherapywith a PLMSindex of 34.11 Avoid alcohol, sedatives and other CNS depressants that may worsen sleep apnea and disrupt normal sleep architecture. Sleep hygiene should be reviewed to assess factors that may improve sleep quality. Weight management and regular exercise should be initiated or continued if appropriate.  [Electronically signed] 09/16/2016 10:50 AM   ASSESSMENT:    1.  Essential hypertension   2. UARS (upper airway resistance syndrome)   3. Atypical chest pain   4. Hyperlipidemia with target LDL less than 70   5. Grade I diastolic dysfunction   6. Morbid obesity ([place])   7. Type 2 diabetes mellitus without complication, with long-term current use of insulin ([place])   8. Hypothyroidism, unspecified type    PLAN:  [name] is a 64 year old female who has a history of morbid obesity with a current BMI of 44.19, as well as a history of hypertension, type 2 diabetes mellitus, GERD, and snoring.  Her blood pressure today is stable on her current regimen consisting of benazepril 10 mg, furosemide 20 mg daily.  She also has a prescription for diltiazem 30 mg which she had taken in the past on a as needed basis during periods of increased heart rate and panic attacks.  She previously underwent a sleep study which revealed increased upper airway resistance syndrome without definitive sleep apnea.  An Epworth Sleepiness Scale score was calculated in the office today and this endorsed at 3 arguing against any daytime sleepiness.  She believes she is sleeping better.  Her stress load has significantly improved since she is now separated from her husband.  She continues to be on rosuvastatin 40 mg for hyperlipidemia LDL cholesterol on August 08, 2019 was excellent at 66.  She is diabetic on glipizide in addition to Metformin.  Her GERD is controlled with Dexilant.  She continues to be on levothyroxine 150 mcg for hypothyroidism.  TSH level was 0.69 on August 08, 2019.  She is followed by Dr. Tula Nakayama for her primary care.  Cardiac wise she is stable.  I will see her in 1 year for reevaluation or sooner as needed   Medication Adjustments/Labs and Tests Ordered: Current medicines are reviewed at length with the patient today.  Concerns regarding medicines are outlined above.  Medication changes, Labs and Tests ordered today are listed in the Patient Instructions  below. Patient Instructions  Medication Instructions:  CONTINUE WITH CURRENT MEDICATIONS. NO CHANGES.  *If you need a refill on your cardiac medications before your next appointment, please call your pharmacy*    Follow-Up: At [facility], you and your health needs are our priority.  As part of our continuing mission to provide you with exceptional heart care, we have created designated Provider Care Teams.  These Care Teams include your primary Cardiologist (physician) and Advanced Practice Providers (APPs -  Physician Assistants and Nurse Practitioners) who all work together to provide you with the care you need, when you need it.  We recommend signing up for the patient portal called "MyChart".  Sign up information is provided on this After Visit Summary.  MyChart is used to connect with patients for Virtual Visits (Telemedicine).  Patients are able to view lab/test results, encounter notes, upcoming appointments, etc.  Non-urgent messages can be sent to your provider as well.   To learn more about what you can do with MyChart, go to [email or web address].    Your next appointment:   12 month(s)  The format for your next appointment:   In Person  Provider:   Shelva Majestic, MD       Signed, Shelva Majestic, MD  09/18/2019 2:40 PM    [place] [address], [place], [place], [place]  [address] Phone: [phone]

## 2019-09-17 NOTE — Patient Instructions (Signed)

## 2019-09-18 ENCOUNTER — Encounter: Payer: Self-pay | Admitting: Cardiovascular Disease

## 2019-09-20 ENCOUNTER — Ambulatory Visit: Payer: Medicare Other | Admitting: Dietician

## 2019-09-24 ENCOUNTER — Ambulatory Visit: Payer: Medicare Other | Admitting: Family Medicine

## 2019-09-25 ENCOUNTER — Encounter: Payer: Self-pay | Admitting: Family Medicine

## 2019-09-25 ENCOUNTER — Ambulatory Visit (INDEPENDENT_AMBULATORY_CARE_PROVIDER_SITE_OTHER): Payer: Medicare Other | Admitting: Family Medicine

## 2019-09-25 ENCOUNTER — Other Ambulatory Visit: Payer: Self-pay

## 2019-09-25 VITALS — BP 108/70 | HR 82 | Resp 16 | Ht 68.0 in | Wt 289.0 lb

## 2019-09-25 DIAGNOSIS — I10 Essential (primary) hypertension: Secondary | ICD-10-CM

## 2019-09-25 DIAGNOSIS — Z23 Encounter for immunization: Secondary | ICD-10-CM | POA: Diagnosis not present

## 2019-09-25 DIAGNOSIS — E785 Hyperlipidemia, unspecified: Secondary | ICD-10-CM

## 2019-09-25 DIAGNOSIS — M5090 Cervical disc disorder, unspecified, unspecified cervical region: Secondary | ICD-10-CM

## 2019-09-25 DIAGNOSIS — E1165 Type 2 diabetes mellitus with hyperglycemia: Secondary | ICD-10-CM | POA: Diagnosis not present

## 2019-09-25 LAB — POCT GLYCOSYLATED HEMOGLOBIN (HGB A1C): Hemoglobin A1C: 7.6 % — AB (ref 4.0–5.6)

## 2019-09-25 NOTE — Assessment & Plan Note (Signed)
Hyperlipidemia:Low fat diet discussed and encouraged.   Lipid Panel  Lab Results  Component Value Date   CHOL 140 08/08/2019   HDL 53 08/08/2019   LDLCALC 66 08/08/2019   TRIG 131 08/08/2019   CHOLHDL 2.6 08/08/2019   Controlled, no change in medication

## 2019-09-25 NOTE — Progress Notes (Signed)
FANCY DUNKLEY     MRN: [id number]      DOB: [birth year]   HPI Ms. Dominey is here for follow up and re-evaluation of chronic medical conditions, medication management and review of any available recent lab and radiology data.  Preventive health is updated, specifically  Cancer screening and Immunization.   C/o increased neck and back pain The PT denies any adverse reactions to current medications since the last visit.  FBG range from 98 to  160, avg approx 140, needs to take metformin as prescribed, and commit to 30 mins exercise / day  ROS Denies recent fever or chills. Denies sinus pressure, nasal congestion, ear pain or sore throat. Denies chest congestion, productive cough or wheezing. Denies chest pains, palpitations and leg swelling Denies abdominal pain, nausea, vomiting,diarrhea or constipation.   Denies dysuria, frequency, hesitancy or incontinence.  Denies headaches, seizures, numbness, or tingling. Denies depression, anxiety or insomnia. Denies skin break down or rash.   PE  BP 108/70   Pulse 82   Resp 16   Ht 5\' 8"  (1.727 m)   Wt 289 lb (131.1 kg)   SpO2 95%   BMI 43.94 kg/m   Patient alert and oriented and in no cardiopulmonary distress.  HEENT: No facial asymmetry, EOMI,     Neck decreased ROM .  Chest: Clear to auscultation bilaterally.  CVS: S1, S2 no murmurs, no S3.Regular rate.  ABD: Soft non tender.   Ext: No edema  MS: decreased  ROM spine, shoulders, hips and knees.  Skin: Intact, no ulcerations or rash noted.  Psych: Good eye contact, normal affect. Memory intact not anxious or depressed appearing.  CNS: CN 2-12 intact, power,  normal throughout.no focal deficits noted.   Assessment & Plan  Hypertension goal BP (blood pressure) < 130/80 Controlled, no change in medication DASH diet and commitment to daily physical activity for a minimum of 30 minutes discussed and encouraged, as a part of hypertension management. The importance of  attaining a healthy weight is also discussed.  BP/Weight 09/25/2019 09/17/2019 08/16/2019 08/14/2019 08/01/2019 02/22/2019 12/87/8676  Systolic BP 720 947 - 096 283 - 662  Diastolic BP 70 78 - 78 75 - 84  Wt. (Lbs) 289 290.6 294 296 295 288 288  BMI 43.94 44.19 44.7 45.01 44.85 43.79 43.79       Type 2 diabetes mellitus with hyperglycemia ([place]) Ms. Sieh is reminded of the importance of commitment to daily physical activity for 30 minutes or more, as able and the need to limit carbohydrate intake to 30 to 60 grams per meal to help with blood sugar control.   The need to take medication as prescribed, test blood sugar as directed, and to call between visits if there is a concern that blood sugar is uncontrolled is also discussed.   Ms. Cutshaw is reminded of the importance of daily foot exam, annual eye examination, and good blood sugar, blood pressure and cholesterol control. Needs to increase metformin to 3 times daily as prescribed and start daily exercise Diabetic Labs Latest Ref Rng & Units 09/25/2019 08/08/2019 12/07/2018 08/15/2018 04/07/2018  HbA1c 4.0 - 5.6 % 7.6(A) - 7.0(H) 7.1(H) 7.4(H)  Microalbumin mg/dL - 0.2 - - -  Micro/Creat Ratio <30 mcg/mg creat - 6 - - -  Chol <200 mg/dL - 140 169 246(H) 213(H)  HDL > OR = 50 mg/dL - 53 43(L) 49(L) 52  Calc LDL mg/dL (calc) - 66 96 162(H) 135(H)  Triglycerides <150 mg/dL - 131  209(H) 193(H) 132  Creatinine 0.50 - 0.99 mg/dL - 1.01(H) 1.06(H) 0.89 0.86   BP/Weight 09/25/2019 09/17/2019 08/16/2019 08/14/2019 08/01/2019 02/22/2019 98/92/1194  Systolic BP 174 081 - 448 185 - 631  Diastolic BP 70 78 - 78 75 - 84  Wt. (Lbs) 289 290.6 294 296 295 288 288  BMI 43.94 44.19 44.7 45.01 44.85 43.79 43.79   Foot/eye exam completion dates Latest Ref Rng & Units 12/13/2018 09/28/2018  Eye Exam No Retinopathy - No Retinopathy  Foot Form Completion - Done -        Morbid obesity  Patient re-educated about  the importance of commitment to a  minimum of 150  minutes of exercise per week as able.  The importance of healthy food choices with portion control discussed, as well as eating regularly and within a 12 hour window most days. The need to choose "clean , green" food 50 to 75% of the time is discussed, as well as to make water the primary drink and set a goal of 64 ounces water daily.    Weight /BMI 09/25/2019 09/17/2019 08/16/2019  WEIGHT 289 lb 290 lb 9.6 oz 294 lb  HEIGHT 5\' 8"  5\' 8"  5\' 8"   BMI 43.94 kg/m2 44.19 kg/m2 44.7 kg/m2      Hyperlipidemia Hyperlipidemia:Low fat diet discussed and encouraged.   Lipid Panel  Lab Results  Component Value Date   CHOL 140 08/08/2019   HDL 53 08/08/2019   LDLCALC 66 08/08/2019   TRIG 131 08/08/2019   CHOLHDL 2.6 08/08/2019   Controlled, no change in medication    Cervical neck pain with evidence of disc disease Increased pain, will discuss with painmanagement

## 2019-09-25 NOTE — Assessment & Plan Note (Signed)
Increased pain, will discuss with painmanagement

## 2019-09-25 NOTE — Assessment & Plan Note (Signed)
[name] is reminded of the importance of commitment to daily physical activity for 30 minutes or more, as able and the need to limit carbohydrate intake to 30 to 60 grams per meal to help with blood sugar control.   The need to take medication as prescribed, test blood sugar as directed, and to call between visits if there is a concern that blood sugar is uncontrolled is also discussed.   [name] is reminded of the importance of daily foot exam, annual eye examination, and good blood sugar, blood pressure and cholesterol control. Needs to increase metformin to 3 times daily as prescribed and start daily exercise Diabetic Labs Latest Ref Rng & Units 09/25/2019 08/08/2019 12/07/2018 08/15/2018 04/07/2018  HbA1c 4.0 - 5.6 % 7.6(A) - 7.0(H) 7.1(H) 7.4(H)  Microalbumin mg/dL - 0.2 - - -  Micro/Creat Ratio <30 mcg/mg creat - 6 - - -  Chol <200 mg/dL - 140 169 246(H) 213(H)  HDL > OR = 50 mg/dL - 53 43(L) 49(L) 52  Calc LDL mg/dL (calc) - 66 96 162(H) 135(H)  Triglycerides <150 mg/dL - 131 209(H) 193(H) 132  Creatinine 0.50 - 0.99 mg/dL - 1.01(H) 1.06(H) 0.89 0.86   BP/Weight 09/25/2019 09/17/2019 08/16/2019 08/14/2019 08/01/2019 02/22/2019 37/36/6815  Systolic BP 947 076 - 151 834 - 373  Diastolic BP 70 78 - 78 75 - 84  Wt. (Lbs) 289 290.6 294 296 295 288 288  BMI 43.94 44.19 44.7 45.01 44.85 43.79 43.79   Foot/eye exam completion dates Latest Ref Rng & Units 12/13/2018 09/28/2018  Eye Exam No Retinopathy - No Retinopathy  Foot Form Completion - Done -

## 2019-09-25 NOTE — Assessment & Plan Note (Signed)
  Patient re-educated about  the importance of commitment to a  minimum of 150 minutes of exercise per week as able.  The importance of healthy food choices with portion control discussed, as well as eating regularly and within a 12 hour window most days. The need to choose "clean , green" food 50 to 75% of the time is discussed, as well as to make water the primary drink and set a goal of 64 ounces water daily.    Weight /BMI 09/25/2019 09/17/2019 08/16/2019  WEIGHT 289 lb 290 lb 9.6 oz 294 lb  HEIGHT 5\' 8"  5\' 8"  5\' 8"   BMI 43.94 kg/m2 44.19 kg/m2 44.7 kg/m2

## 2019-09-25 NOTE — Assessment & Plan Note (Signed)
Controlled, no change in medication DASH diet and commitment to daily physical activity for a minimum of 30 minutes discussed and encouraged, as a part of hypertension management. The importance of attaining a healthy weight is also discussed.  BP/Weight 09/25/2019 09/17/2019 08/16/2019 08/14/2019 08/01/2019 02/22/2019 77/41/4239  Systolic BP 532 023 - 343 568 - 616  Diastolic BP 70 78 - 78 75 - 84  Wt. (Lbs) 289 290.6 294 296 295 288 288  BMI 43.94 44.19 44.7 45.01 44.85 43.79 43.79

## 2019-09-25 NOTE — Patient Instructions (Addendum)
Annual physical exam first week in December, call if you need me before  Much better blood sugar. Keep it up  NEED to take metformin as prescribed, one tablet THREE times daily  Commit to THREE 10 minute exercise sesssions each day, sittting or walking in place   Exercises To Do While Sitting  Exercises that you do while sitting (chair exercises) can give you many of the same benefits as full exercise. Benefits include strengthening your heart, burning calories, and keeping muscles and joints healthy. Exercise can also improve your mood and help with depression and anxiety. You may benefit from chair exercises if you are unable to do standing exercises because of:  Diabetic foot pain.  Obesity.  Illness.  Arthritis.  Recovery from surgery or injury.  Breathing problems.  Balance problems.  Another type of disability. Before starting chair exercises, check with your health care provider or a physical therapist to find out how much exercise you can tolerate and which exercises are safe for you. If your health care provider approves:  Start out slowly and build up over time. Aim to work up to about 10-20 minutes for each exercise session.  Make exercise part of your daily routine.  Drink water when you exercise. Do not wait until you are thirsty. Drink every 10-15 minutes.  Stop exercising right away if you have pain, nausea, shortness of breath, or dizziness.  If you are exercising in a wheelchair, make sure to lock the wheels.  Ask your health care provider whether you can do tai chi or yoga. Many positions in these mind-body exercises can be modified to do while seated. Warm-up Before starting other exercises: 1. Sit up as straight as you can. Have your knees bent at 90 degrees, which is the shape of the capital letter "L." Keep your feet flat on the floor. 2. Sit at the front edge of your chair, if you can. 3. Pull in (tighten) the muscles in your abdomen and stretch  your spine and neck as straight as you can. Hold this position for a few minutes. 4. Breathe in and out evenly. Try to concentrate on your breathing, and relax your mind. Stretching Exercise A: Arm stretch 1. Hold your arms out straight in front of your body. 2. Bend your hands at the wrist with your fingers pointing up, as if signaling someone to stop. Notice the slight tension in your forearms as you hold the position. 3. Keeping your arms out and your hands bent, rotate your hands outward as far as you can and hold this stretch. Aim to have your thumbs pointing up and your pinkie fingers pointing down. Slowly repeat arm stretches for one minute as tolerated. Exercise B: Leg stretch 1. If you can move your legs, try to "draw" letters on the floor with the toes of your foot. Write your name with one foot. 2. Write your name with the toes of your other foot. Slowly repeat the movements for one minute as tolerated. Exercise C: Reach for the sky 1. Reach your hands as far over your head as you can to stretch your spine. 2. Move your hands and arms as if you are climbing a rope. Slowly repeat the movements for one minute as tolerated. Range of motion exercises Exercise A: Shoulder roll 1. Let your arms hang loosely at your sides. 2. Lift just your shoulders up toward your ears, then let them relax back down. 3. When your shoulders feel loose, rotate your shoulders in backward  and forward circles. Do shoulder rolls slowly for one minute as tolerated. Exercise B: March in place 1. As if you are marching, pump your arms and lift your legs up and down. Lift your knees as high as you can. ? If you are unable to lift your knees, just pump your arms and move your ankles and feet up and down. March in place for one minute as tolerated. Exercise C: Seated jumping jacks 1. Let your arms hang down straight. 2. Keeping your arms straight, lift them up over your head. Aim to point your fingers to the  ceiling. 3. While you lift your arms, straighten your legs and slide your heels along the floor to your sides, as wide as you can. 4. As you bring your arms back down to your sides, slide your legs back together. ? If you are unable to use your legs, just move your arms. Slowly repeat seated jumping jacks for one minute as tolerated. Strengthening exercises Exercise A: Shoulder squeeze 1. Hold your arms straight out from your body to your sides, with your elbows bent and your fists pointed at the ceiling. 2. Keeping your arms in the bent position, move them forward so your elbows and forearms meet in front of your face. 3. Open your arms back out as wide as you can with your elbows still bent, until you feel your shoulder blades squeezing together. Hold for 5 seconds. Slowly repeat the movements forward and backward for one minute as tolerated. Contact a health care provider if you:  Had to stop exercising due to any of the following: ? Pain. ? Nausea. ? Shortness of breath. ? Dizziness. ? Fatigue.  Have significant pain or soreness after exercising. Get help right away if you have:  Chest pain.  Difficulty breathing. These symptoms may represent a serious problem that is an emergency. Do not wait to see if the symptoms will go away. Get medical help right away. Call your local emergency services (911 in the [place] Do not drive yourself to the hospital. This information is not intended to replace advice given to you by your health care provider. Make sure you discuss any questions you have with your health care provider. Document Revised: 05/04/2018 Document Reviewed: 11/24/2016 Elsevier Patient Education  2020 [employer].

## 2019-09-28 LAB — HM DIABETES EYE EXAM

## 2019-10-05 ENCOUNTER — Other Ambulatory Visit: Payer: Self-pay | Admitting: Family Medicine

## 2019-11-09 ENCOUNTER — Other Ambulatory Visit: Payer: Self-pay | Admitting: Family Medicine

## 2019-11-15 ENCOUNTER — Other Ambulatory Visit: Payer: Self-pay

## 2019-11-15 MED ORDER — ONETOUCH DELICA LANCETS 33G MISC: 5 refills | Status: DC

## 2019-11-15 MED ORDER — ONETOUCH VERIO VI STRP: ORAL_STRIP | 5 refills | Status: DC

## 2019-11-17 ENCOUNTER — Other Ambulatory Visit: Payer: Self-pay | Admitting: Family Medicine

## 2019-12-24 ENCOUNTER — Telehealth (INDEPENDENT_AMBULATORY_CARE_PROVIDER_SITE_OTHER): Payer: Medicare Other

## 2019-12-24 ENCOUNTER — Other Ambulatory Visit: Payer: Self-pay

## 2019-12-24 VITALS — BP 108/70 | Ht 68.0 in | Wt 284.0 lb

## 2019-12-24 DIAGNOSIS — Z0000 Encounter for general adult medical examination without abnormal findings: Secondary | ICD-10-CM | POA: Diagnosis not present

## 2019-12-24 NOTE — Patient Instructions (Signed)
[name] , Thank you for taking time to come for your Medicare Wellness Visit. I appreciate your ongoing commitment to your health goals. Please review the following plan we discussed and let me know if I can assist you in the future.   Screening recommendations/referrals: Colonoscopy: 12/20/22 Mammogram: 06/26/21 Bone Density: Age 64 Recommended yearly ophthalmology/optometry visit for glaucoma screening and checkup Recommended yearly dental visit for hygiene and checkup  Vaccinations: Influenza vaccine: Fall 2022  Pneumococcal vaccine: Complete Tdap vaccine: 12/12/28 Shingles vaccine: Complete    Advanced directives: No  Conditions/risks identified: None  Next appointment: 12/26/19 @ 8:30am  Preventive Care 40-64 Years, Female Preventive care refers to lifestyle choices and visits with your health care provider that can promote health and wellness. What does preventive care include?  A yearly physical exam. This is also called an annual well check.  Dental exams once or twice a year.  Routine eye exams. Ask your health care provider how often you should have your eyes checked.  Personal lifestyle choices, including:  Daily care of your teeth and gums.  Regular physical activity.  Eating a healthy diet.  Avoiding tobacco and drug use.  Limiting alcohol use.  Practicing safe sex.  Taking low-dose aspirin daily starting at age 36.  Taking vitamin and mineral supplements as recommended by your health care provider. What happens during an annual well check? The services and screenings done by your health care provider during your annual well check will depend on your age, overall health, lifestyle risk factors, and family history of disease. Counseling  Your health care provider may ask you questions about your:  Alcohol use.  Tobacco use.  Drug use.  Emotional well-being.  Home and relationship well-being.  Sexual activity.  Eating habits.  Work and  work Statistician.  Method of birth control.  Menstrual cycle.  Pregnancy history. Screening  You may have the following tests or measurements:  Height, weight, and BMI.  Blood pressure.  Lipid and cholesterol levels. These may be checked every 5 years, or more frequently if you are over 1 years old.  Skin check.  Lung cancer screening. You may have this screening every year starting at age 26 if you have a 30-pack-year history of smoking and currently smoke or have quit within the past 15 years.  Fecal occult blood test (FOBT) of the stool. You may have this test every year starting at age 78.  Flexible sigmoidoscopy or colonoscopy. You may have a sigmoidoscopy every 5 years or a colonoscopy every 10 years starting at age 72.  Hepatitis C blood test.  Hepatitis B blood test.  Sexually transmitted disease (STD) testing.  Diabetes screening. This is done by checking your blood sugar (glucose) after you have not eaten for a while (fasting). You may have this done every 1-3 years.  Mammogram. This may be done every 1-2 years. Talk to your health care provider about when you should start having regular mammograms. This may depend on whether you have a family history of breast cancer.  BRCA-related cancer screening. This may be done if you have a family history of breast, ovarian, tubal, or peritoneal cancers.  Pelvic exam and Pap test. This may be done every 3 years starting at age 53. Starting at age 59, this may be done every 5 years if you have a Pap test in combination with an HPV test.  Bone density scan. This is done to screen for osteoporosis. You may have this scan if you are  at high risk for osteoporosis. Discuss your test results, treatment options, and if necessary, the need for more tests with your health care provider. Vaccines  Your health care provider may recommend certain vaccines, such as:  Influenza vaccine. This is recommended every year.  Tetanus,  diphtheria, and acellular pertussis (Tdap, Td) vaccine. You may need a Td booster every 10 years.  Zoster vaccine. You may need this after age 2.  Pneumococcal 13-valent conjugate (PCV13) vaccine. You may need this if you have certain conditions and were not previously vaccinated.  Pneumococcal polysaccharide (PPSV23) vaccine. You may need one or two doses if you smoke cigarettes or if you have certain conditions. Talk to your health care provider about which screenings and vaccines you need and how often you need them. This information is not intended to replace advice given to you by your health care provider. Make sure you discuss any questions you have with your health care provider. Document Released: 02/07/2015 Document Revised: 10/01/2015 Document Reviewed: 11/12/2014 Elsevier Interactive Patient Education  2017 [employer] Prevention in the Home Falls can cause injuries. They can happen to people of all ages. There are many things you can do to make your home safe and to help prevent falls. What can I do on the outside of my home?  Regularly fix the edges of walkways and driveways and fix any cracks.  Remove anything that might make you trip as you walk through a door, such as a raised step or threshold.  Trim any bushes or trees on the path to your home.  Use bright outdoor lighting.  Clear any walking paths of anything that might make someone trip, such as rocks or tools.  Regularly check to see if handrails are loose or broken. Make sure that both sides of any steps have handrails.  Any raised decks and porches should have guardrails on the edges.  Have any leaves, snow, or ice cleared regularly.  Use sand or salt on walking paths during winter.  Clean up any spills in your garage right away. This includes oil or grease spills. What can I do in the bathroom?  Use night lights.  Install grab bars by the toilet and in the tub and shower. Do not use towel  bars as grab bars.  Use non-skid mats or decals in the tub or shower.  If you need to sit down in the shower, use a plastic, non-slip stool.  Keep the floor dry. Clean up any water that spills on the floor as soon as it happens.  Remove soap buildup in the tub or shower regularly.  Attach bath mats securely with double-sided non-slip rug tape.  Do not have throw rugs and other things on the floor that can make you trip. What can I do in the bedroom?  Use night lights.  Make sure that you have a light by your bed that is easy to reach.  Do not use any sheets or blankets that are too big for your bed. They should not hang down onto the floor.  Have a firm chair that has side arms. You can use this for support while you get dressed.  Do not have throw rugs and other things on the floor that can make you trip. What can I do in the kitchen?  Clean up any spills right away.  Avoid walking on wet floors.  Keep items that you use a lot in easy-to-reach places.  If you need to  reach something above you, use a strong step stool that has a grab bar.  Keep electrical cords out of the way.  Do not use floor polish or wax that makes floors slippery. If you must use wax, use non-skid floor wax.  Do not have throw rugs and other things on the floor that can make you trip. What can I do with my stairs?  Do not leave any items on the stairs.  Make sure that there are handrails on both sides of the stairs and use them. Fix handrails that are broken or loose. Make sure that handrails are as long as the stairways.  Check any carpeting to make sure that it is firmly attached to the stairs. Fix any carpet that is loose or worn.  Avoid having throw rugs at the top or bottom of the stairs. If you do have throw rugs, attach them to the floor with carpet tape.  Make sure that you have a light switch at the top of the stairs and the bottom of the stairs. If you do not have them, ask someone to  add them for you. What else can I do to help prevent falls?  Wear shoes that:  Do not have high heels.  Have rubber bottoms.  Are comfortable and fit you well.  Are closed at the toe. Do not wear sandals.  If you use a stepladder:  Make sure that it is fully opened. Do not climb a closed stepladder.  Make sure that both sides of the stepladder are locked into place.  Ask someone to hold it for you, if possible.  Clearly mark and make sure that you can see:  Any grab bars or handrails.  First and last steps.  Where the edge of each step is.  Use tools that help you move around (mobility aids) if they are needed. These include:  Canes.  Walkers.  Scooters.  Crutches.  Turn on the lights when you go into a dark area. Replace any light bulbs as soon as they burn out.  Set up your furniture so you have a clear path. Avoid moving your furniture around.  If any of your floors are uneven, fix them.  If there are any pets around you, be aware of where they are.  Review your medicines with your doctor. Some medicines can make you feel dizzy. This can increase your chance of falling. Ask your doctor what other things that you can do to help prevent falls. This information is not intended to replace advice given to you by your health care provider. Make sure you discuss any questions you have with your health care provider. Document Released: 11/07/2008 Document Revised: 06/19/2015 Document Reviewed: 02/15/2014 Elsevier Interactive Patient Education  2017 [employer].

## 2019-12-24 NOTE — Progress Notes (Signed)
Subjective:   [name] is a 64 y.o. female who presents for Medicare Annual (Subsequent) preventive examination.  Review of Systems       Objective:    There were no vitals filed for this visit. There is no height or weight on file to calculate BMI.  Advanced Directives 08/16/2019 12/18/2018 01/05/2018 08/11/2016  Does Patient Have a Medical Advance Directive? No No No No  Would patient like information on creating a medical advance directive? Yes (MAU/Ambulatory/Procedural Areas - Information given) No - Patient declined No - Patient declined Yes (ED - Information included in AVS)    Current Medications (verified) Outpatient Encounter Medications as of 12/24/2019  Medication Sig  . aspirin 81 MG tablet Take 81 mg by mouth daily.    . benazepril (LOTENSIN) 5 MG tablet TAKE TWO (2) TABLETS BY MOUTH EVERY DAY  . blood glucose meter kit and supplies Dispense based on patient and insurance preference. Once daily testing dx e11.9  . calcium-vitamin D (OSCAL WITH D) 500-200 MG-UNIT per tablet Take 1 tablet by mouth daily.  . cyclobenzaprine (FLEXERIL) 5 MG tablet TAKE ONE TABLET BY MOUTH AT BEDTIME AS NEEDED. SHOULD LAST SIX MONTHS.  Marland Kitchen Dexlansoprazole (DEXILANT) 30 MG capsule Take 30 mg by mouth daily.  Marland Kitchen diltiazem (CARDIZEM) 30 MG tablet Take 30 mg by mouth daily. Unsure dose  . furosemide (LASIX) 20 MG tablet TAKE ONE TABLET BY MOUTH ONCE DAILY FOR LEG SWELLING.  . gabapentin (NEURONTIN) 300 MG capsule TAKE ONE CAPSULE BY MOUTH TWICE DAILY  . glipiZIDE (GLUCOTROL XL) 5 MG 24 hr tablet TAKE ONE TABLET BY MOUTH ONCE DAILY WITH BREAKFAST.  Marland Kitchen glucose blood (ONETOUCH VERIO) test strip Use as instructed once daily testing dx e11.9  . ibuprofen (ADVIL) 800 MG tablet TAKE ONE TABLET BY MOUTH EVERY 8 HOURS AS NEEDED.  Marland Kitchen levothyroxine (SYNTHROID) 150 MCG tablet TAKE ONE TABLET BY MOUTH DAILY.  . metFORMIN (GLUCOPHAGE) 500 MG tablet Take one tablet by mouth three times daily with meals    . OneTouch Delica Lancets 68T MISC Once daily testing dx e11.9  . oxyCODONE-acetaminophen (PERCOCET) 7.5-325 MG tablet Take one tablet once daily , as needed , for uncontrolled back pain  . potassium chloride SA (KLOR-CON) 20 MEQ tablet TAKE ONE TABLET BY MOUTH ONCE DAILY AS NEEDED ON DAYS YOU TAKE FUROSEMIDE FOR SWELLING.  . rosuvastatin (CRESTOR) 40 MG tablet TAKE ONE TABLET BY MOUTH DAILY.  . traZODone (DESYREL) 100 MG tablet TAKE ONE TABLET BY MOUTH AT BEDTIME   Facility-Administered Encounter Medications as of 12/24/2019  Medication  . Influenza (>/= 3 years) inactive virus vaccine (FLVIRIN/FLUZONE) injection SUSP 0.5 mL  . methylPREDNISolone acetate (DEPO-MEDROL) injection 120 mg    Allergies (verified) Naproxen sodium, Tsh [thyrotropin], and Statins   History: Past Medical History:  Diagnosis Date  . Allergy   . Anemia   . Arthritis   . Cataract    forming  . Diabetes mellitus without complication ([place]) 1572  . GERD (gastroesophageal reflux disease)   . Hyperlipidemia   . Hypertension 2011  . Morbid obesity ([place])   . Neuromuscular disorder ([place])    issues with nerves in back   . Osteopenia   . Thyroid disease    Past Surgical History:  Procedure Laterality Date  . ABDOMINAL HYSTERECTOMY    . APPENDECTOMY    . CHOLECYSTECTOMY    . COLONOSCOPY  2009   [facility] - normal per pt   . TONSILLECTOMY  Family History  Problem Relation Age of Onset  . Asthma Mother   . Hypertension Mother   . Heart disease Mother 64       cabg  . Diabetes Father   . Hypertension Father   . Heart disease Father 5       cabg  . Cancer Sister        breast, uterine  . Cancer Maternal Grandmother   . Cancer Paternal Grandfather   . Cancer Sister   . Prostate cancer Paternal Grandmother   . Colon polyps Neg Hx   . Colon cancer Neg Hx   . Esophageal cancer Neg Hx   . Rectal cancer Neg Hx   . Stomach cancer Neg Hx    Social History   Socioeconomic History  . Marital  status: Married    Spouse name: Barnie Alderman   . Number of children: 2  . Years of education: 67  . Highest education level: 12th grade  Occupational History  . Occupation: disabled   Tobacco Use  . Smoking status: Never Smoker  . Smokeless tobacco: Never Used  Substance and Sexual Activity  . Alcohol use: No  . Drug use: No  . Sexual activity: Not Currently  Other Topics Concern  . Not on file  Social History Narrative   Lives with Husband alone    Social Determinants of Health   Financial Resource Strain:   . Difficulty of Paying Living Expenses: Not on file  Food Insecurity:   . Worried About Charity fundraiser in the Last Year: Not on file  . Ran Out of Food in the Last Year: Not on file  Transportation Needs:   . Lack of Transportation (Medical): Not on file  . Lack of Transportation (Non-Medical): Not on file  Physical Activity:   . Days of Exercise per Week: Not on file  . Minutes of Exercise per Session: Not on file  Stress:   . Feeling of Stress : Not on file  Social Connections:   . Frequency of Communication with Friends and Family: Not on file  . Frequency of Social Gatherings with Friends and Family: Not on file  . Attends Religious Services: Not on file  . Active Member of Clubs or Organizations: Not on file  . Attends Archivist Meetings: Not on file  . Marital Status: Not on file    Tobacco Counseling Counseling given: Not Answered   Clinical Intake:                 Diabetic? yes         Activities of Daily Living No flowsheet data found.  Patient Care Team: Fayrene Helper, MD as PCP - General  Indicate any recent Medical Services you may have received from other than [facility] providers in the past year (date may be approximate).     Assessment:   This is a routine wellness examination for [name].  Hearing/Vision screen No exam data present  Dietary issues and exercise activities discussed:    Goals    .  Increase physical activity    . Weight (lb) < 200 lb (90.7 kg)      Depression Screen PHQ 2/9 Scores 09/25/2019 08/20/2019 08/16/2019 08/01/2019 12/18/2018 08/15/2018 06/02/2018  PHQ - 2 Score 0 _0 0 5 0  PHQ- 9 Score - - - 10 - 19 -    Fall Risk Fall Risk  09/25/2019 08/20/2019 08/16/2019 08/14/2019 08/01/2019  Falls in the past year?  0 0 0 0 0  Number falls in past yr: - - - 0 -  Injury with Fall? - - - 0 -  Follow up - - - - -    Any stairs in or around the home? Yes  If so, are there any without handrails? Yes  Home free of loose throw rugs in walkways, pet beds, electrical cords, etc? Yes  Adequate lighting in your home to reduce risk of falls? Yes   ASSISTIVE DEVICES UTILIZED TO PREVENT FALLS:  Life alert? No  Use of a cane, walker or w/c? Yes  Grab bars in the bathroom? Yes  Shower chair or bench in shower? No  Elevated toilet seat or a handicapped toilet? No   TIMED UP AND GO:  Was the test performed? No .  Length of time to ambulate n/a    Cognitive Function:     6CIT Screen 12/18/2018 01/05/2018  What Year? 0 points 0 points  What month? 0 points 0 points  What time? 0 points 0 points  Count back from 20 0 points 0 points  Months in reverse 0 points 0 points  Repeat phrase 0 points 6 points  Total Score 0 6    Immunizations Immunization History  Administered Date(s) Administered  . Influenza Whole 12/03/2008, 10/19/2010  . Influenza,inj,Quad PF,6+ Mos 12/27/2012, 11/01/2013, 10/25/2014, 11/10/2015, 11/24/2016, 12/08/2017, 12/13/2018, 09/25/2019  . Pneumococcal Conjugate-13 04/11/2014  . Pneumococcal Polysaccharide-23 03/05/2013  . Td 12/03/2008  . Tdap 12/13/2018  . Zoster Recombinat (Shingrix) 07/21/2017, 01/05/2018    TDAP status: Up to date Flu Vaccine status: Up to date Pneumococcal vaccine status: Up to date Covid-19 vaccine status: Declined, Education has been provided regarding the importance of this vaccine but patient still declined. Advised may  receive this vaccine at local pharmacy or Health Dept.or vaccine clinic. Aware to provide a copy of the vaccination record if obtained from local pharmacy or Health Dept. Verbalized acceptance and understanding.  Qualifies for Shingles Vaccine? Yes   Zostavax completed No   Shingrix Completed?: Yes  Screening Tests Health Maintenance  Topic Date Due  . FOOT EXAM  12/13/2019  . HEMOGLOBIN A1C  03/24/2020  . OPHTHALMOLOGY EXAM  09/27/2020  . MAMMOGRAM  06/26/2021  . PAP SMEAR-Modifier  12/12/2021  . COLONOSCOPY  12/20/2022  . TETANUS/TDAP  12/12/2028  . INFLUENZA VACCINE  Completed  . Hepatitis C Screening  Completed  . HIV Screening  Completed  . COVID-19 Vaccine  Discontinued    Health Maintenance  Health Maintenance Due  Topic Date Due  . FOOT EXAM  12/13/2019    Colorectal cancer screening: Completed 12/19/17. Repeat every 10 years Mammogram status: Completed 07/07/19. Repeat every year  Lung Cancer Screening: (Low Dose CT Chest recommended if Age 33-80 years, 30 pack-year currently smoking OR have quit w/in 15years.) does not qualify.   Lung Cancer Screening Referral: n/a  Additional Screening:  Hepatitis C Screening: does not qualify  Vision Screening: Recommended annual ophthalmology exams for early detection of glaucoma and other disorders of the eye. Is the patient up to date with their annual eye exam?  Yes  Who is the provider or what is the name of the office in which the patient attends annual eye exams? [facility] If pt is not established with a provider, would they like to be referred to a provider to establish care? n/a.   Dental Screening: Recommended annual dental exams for proper oral hygiene  Community Resource Referral / Chronic Care Management: CRR required  this visit?  No   CCM required this visit?  No      Plan:     I have personally reviewed and noted the following in the patient's chart:   . Medical and social history . Use of alcohol,  tobacco or illicit drugs  . Current medications and supplements . Functional ability and status . Nutritional status . Physical activity . Advanced directives . List of other physicians . Hospitalizations, surgeries, and ER visits in previous 12 months . Vitals . Screenings to include cognitive, depression, and falls . Referrals and appointments  In addition, I have reviewed and discussed with patient certain preventive protocols, quality metrics, and best practice recommendations. A written personalized care plan for preventive services as well as general preventive health recommendations were provided to patient.      Laretta Bolster, [place]   [id number]   Nurse Notes: AWV conducted in the office over the phone with patients consent to televisit. Patient was at home at the time of visit. Provider out of the office today. Visit took 30 minutes to complete.

## 2019-12-25 ENCOUNTER — Other Ambulatory Visit: Payer: Self-pay

## 2019-12-25 DIAGNOSIS — E785 Hyperlipidemia, unspecified: Secondary | ICD-10-CM

## 2019-12-25 DIAGNOSIS — I10 Essential (primary) hypertension: Secondary | ICD-10-CM

## 2019-12-25 DIAGNOSIS — E1165 Type 2 diabetes mellitus with hyperglycemia: Secondary | ICD-10-CM

## 2019-12-26 ENCOUNTER — Other Ambulatory Visit: Payer: Self-pay

## 2019-12-26 ENCOUNTER — Ambulatory Visit: Payer: Medicare Other | Admitting: Family Medicine

## 2019-12-26 VITALS — BP 122/65 | HR 79 | Resp 16 | Ht 68.0 in | Wt 295.0 lb

## 2019-12-26 DIAGNOSIS — Z0000 Encounter for general adult medical examination without abnormal findings: Secondary | ICD-10-CM | POA: Diagnosis not present

## 2019-12-26 DIAGNOSIS — E8881 Metabolic syndrome: Secondary | ICD-10-CM

## 2019-12-26 DIAGNOSIS — I10 Essential (primary) hypertension: Secondary | ICD-10-CM | POA: Diagnosis not present

## 2019-12-26 DIAGNOSIS — E785 Hyperlipidemia, unspecified: Secondary | ICD-10-CM

## 2019-12-26 DIAGNOSIS — E039 Hypothyroidism, unspecified: Secondary | ICD-10-CM

## 2019-12-26 DIAGNOSIS — E559 Vitamin D deficiency, unspecified: Secondary | ICD-10-CM

## 2019-12-26 DIAGNOSIS — E1165 Type 2 diabetes mellitus with hyperglycemia: Secondary | ICD-10-CM | POA: Diagnosis not present

## 2019-12-26 LAB — LIPID PANEL
Chol/HDL Ratio: 2.6 ratio (ref 0.0–4.4)
Cholesterol, Total: 132 mg/dL (ref 100–199)
HDL: 50 mg/dL (ref >39)
LDL Chol Calc (NIH): 57 mg/dL (ref 0–99)
Triglycerides: 145 mg/dL (ref 0–149)
VLDL Cholesterol Cal: 25 mg/dL (ref 5–40)

## 2019-12-26 LAB — BMP8+EGFR
BUN/Creatinine Ratio: 18 (ref 12–28)
BUN: 15 mg/dL (ref 8–27)
CO2: 26 mmol/L (ref 20–29)
Calcium: 9.6 mg/dL (ref 8.7–10.3)
Chloride: 103 mmol/L (ref 96–106)
Creatinine, Ser: 0.85 mg/dL (ref 0.57–1.00)
GFR calc Af Amer: 84 mL/min/{1.73_m2} (ref >59)
GFR calc non Af Amer: 73 mL/min/{1.73_m2} (ref >59)
Glucose: 128 mg/dL — ABNORMAL HIGH (ref 65–99)
Potassium: 4.4 mmol/L (ref 3.5–5.2)
Sodium: 140 mmol/L (ref 134–144)

## 2019-12-26 LAB — HEMOGLOBIN A1C
Est. average glucose Bld gHb Est-mCnc: 157 mg/dL
Hgb A1c MFr Bld: 7.1 % — ABNORMAL HIGH (ref 4.8–5.6)

## 2019-12-26 MED ORDER — METFORMIN HCL 1000 MG PO TABS: 1000.0000 mg | ORAL_TABLET | Freq: Two times a day (BID) | ORAL | 1 refills | Status: DC

## 2019-12-26 NOTE — Patient Instructions (Addendum)
F/U IN OFFICE WITH Md 2ND WEEK IN April, CALL IF YOU NEED ME SOONER  MMSE AT THAT VISIT  CONGRATS ON GREAT LABS AND BP  INCREASE METFORMIN TO 1000 MG TWO TIMES DAILY. OK TO TAKE 500 MG TABLETS TWO TWICE DAILY TILL DONE   FASTING LIPID, CMP AND EGFR, HBA1C, TSH AND VIT D 1 WEEK BEFORE NEXT VISIT  Think about what you will eat, plan ahead. Choose " clean, green, fresh or frozen" over canned, processed or packaged foods which are more sugary, salty and fatty. 70 to 75% of food eaten should be vegetables and fruit. Three meals at set times with snacks allowed between meals, but they must be fruit or vegetables. Aim to eat over a 12 hour period , example 7 am to 7 pm, and STOP after  your last meal of the day. Drink water,generally about 64 ounces per day, no other drink is as healthy. Fruit juice is best enjoyed in a healthy way, by EATING the fruit. It is important that you exercise regularly at least 30 minutes 5 times a week. If you develop chest pain, have severe difficulty breathing, or feel very tired, stop exercising immediately and seek medical attention   Thanks for choosing [facility], we consider it a privelige to serve you.

## 2019-12-26 NOTE — Progress Notes (Signed)
    [name]     MRN: [id number]      DOB: [birth year]  HPI: Patient is in for annual physical exam.  Recent labs,  are reviewed. Immunization is reviewed ,   PE: BP 122/65   Pulse 79   Resp 16   Ht 5\' 8"  (1.727 m)   Wt 295 lb (133.8 kg)   SpO2 95%   BMI 44.85 kg/m  Pleasant  female, alert and oriented x 3, in no cardio-pulmonary distress. Afebrile. HEENT No facial trauma or asymetry. Sinuses non tender.  Extra occullar muscles intact.. External ears normal, . Neck: supple, no adenopathy,JVD or thyromegaly.No bruits.  Chest: Clear to ascultation bilaterally.No crackles or wheezes. Non tender to palpation  Breast: Normal mammogram in 06/2019, and  Asymptomatic,   Cardiovascular system; Heart sounds normal,  S1 and  S2 ,no S3.  No murmur, or thrill. Apical beat not displaced Peripheral pulses normal.  Abdomen: Soft, non tender, no organomegaly or masses. No bruits. Bowel sounds normal. No guarding, tenderness or rebound.      Musculoskeletal exam: Decreased  ROM of spine, hips , shoulders and knees.  deformity ,swelling or crepitus noted. No muscle wasting or atrophy.   Neurologic: Cranial nerves 2 to 12 intact. Power, tone ,sensation and reflexes normal throughout. No disturbance in gait. No tremor.  Skin: Intact, no ulceration, erythema , scaling or rash noted. Pigmentation normal throughout  Psych; Normal mood and affect. Judgement and concentration normal   Assessment & Plan:  Annual physical exam Annual exam as documented. Counseling done  re healthy lifestyle involving commitment to 150 minutes exercise per week, heart healthy diet, and attaining healthy weight.The importance of adequate sleep also discussed. Regular seat belt use and home safety, is also discussed. Changes in health habits are decided on by the patient with goals and time frames  set for achieving them. Immunization and cancer screening needs are specifically addressed  at this visit.

## 2019-12-29 ENCOUNTER — Encounter: Payer: Self-pay | Admitting: Family Medicine

## 2019-12-29 NOTE — Assessment & Plan Note (Signed)

## 2020-01-02 ENCOUNTER — Other Ambulatory Visit: Payer: Self-pay | Admitting: Family Medicine

## 2020-01-07 ENCOUNTER — Other Ambulatory Visit: Payer: Self-pay | Admitting: Family Medicine

## 2020-02-26 ENCOUNTER — Telehealth: Payer: Self-pay

## 2020-02-26 NOTE — Telephone Encounter (Signed)
Noted Copied Sleeved  

## 2020-02-29 DIAGNOSIS — Z8489 Family history of other specified conditions: Secondary | ICD-10-CM

## 2020-03-14 ENCOUNTER — Other Ambulatory Visit: Payer: Self-pay | Admitting: Family Medicine

## 2020-03-21 ENCOUNTER — Other Ambulatory Visit: Payer: Self-pay

## 2020-03-21 ENCOUNTER — Ambulatory Visit (INDEPENDENT_AMBULATORY_CARE_PROVIDER_SITE_OTHER): Payer: Medicare Other | Admitting: Nurse Practitioner

## 2020-03-21 ENCOUNTER — Encounter: Payer: Self-pay | Admitting: Nurse Practitioner

## 2020-03-21 DIAGNOSIS — J069 Acute upper respiratory infection, unspecified: Secondary | ICD-10-CM | POA: Diagnosis not present

## 2020-03-21 DIAGNOSIS — S39012A Strain of muscle, fascia and tendon of lower back, initial encounter: Secondary | ICD-10-CM | POA: Diagnosis not present

## 2020-03-21 MED ORDER — AMOXICILLIN-POT CLAVULANATE 875-125 MG PO TABS: 1.0000 | ORAL_TABLET | Freq: Two times a day (BID) | ORAL | 0 refills | Status: DC

## 2020-03-21 NOTE — Progress Notes (Signed)
Acute Office Visit  Subjective:    Patient ID: [name], female    DOB: July 07, 1955, 65 y.o.   MRN: [id number]  Chief Complaint  Patient presents with  . Sinusitis    X 4 days   . Cough    Deep breaths hurt chest.     HPI Patient is in today for pleuritic chest pain and sinus pain x 4 days.  She is having right ear pain that radiates down her face.  Her cough is productive and she has green sputum. No recent COVID test.  Past Medical History:  Diagnosis Date  . Allergy   . Anemia   . Arthritis   . Cataract    forming  . Diabetes mellitus without complication ([place]) 1224  . GERD (gastroesophageal reflux disease)   . Hyperlipidemia   . Hypertension 2011  . Morbid obesity ([place])   . Neuromuscular disorder ([place])    issues with nerves in back   . Osteopenia   . Thyroid disease     Past Surgical History:  Procedure Laterality Date  . ABDOMINAL HYSTERECTOMY    . APPENDECTOMY    . CHOLECYSTECTOMY    . COLONOSCOPY  2009   [facility] - normal per pt   . TONSILLECTOMY      Family History  Problem Relation Age of Onset  . Asthma Mother   . Hypertension Mother   . Heart disease Mother 34       cabg  . Diabetes Father   . Hypertension Father   . Heart disease Father 43       cabg  . Cancer Sister        breast, uterine  . Cancer Maternal Grandmother   . Cancer Paternal Grandfather   . Cancer Sister   . Prostate cancer Paternal Grandmother   . Colon polyps Neg Hx   . Colon cancer Neg Hx   . Esophageal cancer Neg Hx   . Rectal cancer Neg Hx   . Stomach cancer Neg Hx     Social History   Socioeconomic History  . Marital status: Married    Spouse name: Barnie Alderman   . Number of children: 2  . Years of education: 71  . Highest education level: 12th grade  Occupational History  . Occupation: disabled   Tobacco Use  . Smoking status: Never Smoker  . Smokeless tobacco: Never Used  Substance and Sexual Activity  . Alcohol use: No  . Drug use: No  .  Sexual activity: Not Currently  Other Topics Concern  . Not on file  Social History Narrative   Lives with Husband alone    Social Determinants of Health   Financial Resource Strain: Low Risk   . Difficulty of Paying Living Expenses: Not hard at all  Food Insecurity: No Food Insecurity  . Worried About Charity fundraiser in the Last Year: Never true  . Ran Out of Food in the Last Year: Never true  Transportation Needs: No Transportation Needs  . Lack of Transportation (Medical): No  . Lack of Transportation (Non-Medical): No  Physical Activity: Insufficiently Active  . Days of Exercise per Week: 4 days  . Minutes of Exercise per Session: 30 min  Stress: No Stress Concern Present  . Feeling of Stress : Not at all  Social Connections: Moderately Isolated  . Frequency of Communication with Friends and Family: More than three times a week  . Frequency of Social Gatherings with Friends and Family:  Not on file  . Attends Religious Services: More than 4 times per year  . Active Member of Clubs or Organizations: No  . Attends Archivist Meetings: Never  . Marital Status: Separated  Intimate Partner Violence: Not At Risk  . Fear of Current or Ex-Partner: No  . Emotionally Abused: No  . Physically Abused: No  . Sexually Abused: No    Outpatient Medications Prior to Visit  Medication Sig Dispense Refill  . aspirin 81 MG tablet Take 81 mg by mouth daily.    . benazepril (LOTENSIN) 5 MG tablet TAKE TWO (2) TABLETS BY MOUTH EVERY DAY 180 tablet 1  . blood glucose meter kit and supplies Dispense based on patient and insurance preference. Once daily testing dx e11.9 1 each 0  . calcium-vitamin D (OSCAL WITH D) 500-200 MG-UNIT per tablet Take 1 tablet by mouth daily.    . cyclobenzaprine (FLEXERIL) 5 MG tablet TAKE ONE TABLET BY MOUTH AT BEDTIME AS NEEDED. SHOULD LAST SIX MONTHS. 30 tablet 0  . Dexlansoprazole 30 MG capsule Take 30 mg by mouth daily.    . furosemide (LASIX) 20 MG  tablet TAKE ONE TABLET BY MOUTH ONCE DAILY FOR LEG SWELLING. 30 tablet 5  . gabapentin (NEURONTIN) 300 MG capsule TAKE ONE CAPSULE BY MOUTH TWICE DAILY 180 capsule 3  . glipiZIDE (GLUCOTROL XL) 5 MG 24 hr tablet TAKE ONE TABLET BY MOUTH ONCE DAILY WITH BREAKFAST. 90 tablet 1  . glucose blood (ONETOUCH VERIO) test strip Use as instructed once daily testing dx e11.9 100 each 5  . ibuprofen (ADVIL) 800 MG tablet TAKE ONE TABLET BY MOUTH EVERY 8 HOURS AS NEEDED. 30 tablet 0  . levothyroxine (SYNTHROID) 150 MCG tablet TAKE ONE TABLET BY MOUTH DAILY. 90 tablet 1  . metFORMIN (GLUCOPHAGE) 1000 MG tablet Take 1 tablet (1,000 mg total) by mouth 2 (two) times daily with a meal. 180 tablet 1  . OneTouch Delica Lancets 29B MISC Once daily testing dx e11.9 100 each 5  . oxyCODONE-acetaminophen (PERCOCET) 7.5-325 MG tablet Take one tablet once daily , as needed , for uncontrolled back pain 10 tablet 0  . potassium chloride SA (KLOR-CON) 20 MEQ tablet TAKE ONE TABLET BY MOUTH ONCE DAILY AS NEEDED ON DAYS YOU TAKE FUROSEMIDE FOR SWELLING. 30 tablet 5  . rosuvastatin (CRESTOR) 40 MG tablet TAKE ONE TABLET BY MOUTH DAILY. 90 tablet 3  . traZODone (DESYREL) 100 MG tablet TAKE ONE TABLET BY MOUTH AT BEDTIME 30 tablet 1   Facility-Administered Medications Prior to Visit  Medication Dose Route Frequency Provider Last Rate Last Admin  . Influenza (>/= 3 years) inactive virus vaccine (FLVIRIN/FLUZONE) injection SUSP 0.5 mL  0.5 mL Intramuscular Once Fayrene Helper, MD      . methylPREDNISolone acetate (DEPO-MEDROL) injection 120 mg  120 mg Intramuscular Once Fayrene Helper, MD        Allergies  Allergen Reactions  . Naproxen Sodium Hives and Swelling    MD thought it was the pink dye in the naproxen  . Tsh [Thyrotropin]   . Statins Other (See Comments)    Reports increased appetite and possible muscle ache    Review of Systems  Constitutional: Negative.   HENT: Positive for congestion and ear pain.  Negative for sinus pressure, sinus pain, sneezing and sore throat.        Right ear pain  Respiratory: Positive for cough. Negative for chest tightness, shortness of breath and wheezing.   Cardiovascular:  Pleuritic chest pain  Musculoskeletal: Positive for back pain and myalgias.       Objective:    Physical Exam Constitutional:      Appearance: She is obese.  HENT:     Right Ear: Ear canal and external ear normal.     Left Ear: Tympanic membrane, ear canal and external ear normal.     Ears:     Comments: Perforated right TM, no drainage visible    Nose: Rhinorrhea present.     Mouth/Throat:     Mouth: Mucous membranes are moist.     Pharynx: Oropharynx is clear.  Cardiovascular:     Rate and Rhythm: Normal rate and regular rhythm.     Pulses: Normal pulses.     Heart sounds: Normal heart sounds.  Pulmonary:     Effort: Pulmonary effort is normal.     Breath sounds: Normal breath sounds.  Musculoskeletal:     Cervical back: Normal range of motion and neck supple.     Comments: Pain with back ROM exercises  Neurological:     Mental Status: She is alert.     BP 135/78   Pulse 84   Temp 98.1 F (36.7 C)   Resp 18   Ht _0  (1.727 m)   Wt (!) 301 lb (136.5 kg)   SpO2 94%   BMI 45.77 kg/m  Wt Readings from Last 3 Encounters:  03/21/20 (!) 301 lb (136.5 kg)  12/26/19 295 lb (133.8 kg)  12/24/19 284 lb (128.8 kg)    There are no preventive care reminders to display for this patient.  There are no preventive care reminders to display for this patient.   Lab Results  Component Value Date   TSH 0.69 08/08/2019   Lab Results  Component Value Date   WBC 9.5 12/07/2018   HGB 13.7 12/07/2018   HCT 41.3 12/07/2018   MCV 93.0 12/07/2018   PLT 403 (H) 12/07/2018   Lab Results  Component Value Date   NA 140 12/25/2019   K 4.4 12/25/2019   CO2 26 12/25/2019   GLUCOSE 128 (H) 12/25/2019   BUN 15 12/25/2019   CREATININE 0.85 12/25/2019   BILITOT 0.3  08/08/2019   ALKPHOS 107 03/19/2016   AST 23 08/08/2019   ALT 18 08/08/2019   PROT 7.0 08/08/2019   ALBUMIN 3.9 03/19/2016   CALCIUM 9.6 12/25/2019   Lab Results  Component Value Date   CHOL 132 12/25/2019   Lab Results  Component Value Date   HDL 50 12/25/2019   Lab Results  Component Value Date   LDLCALC 57 12/25/2019   Lab Results  Component Value Date   TRIG 145 12/25/2019   Lab Results  Component Value Date   CHOLHDL 2.6 12/25/2019   Lab Results  Component Value Date   HGBA1C 7.1 (H) 12/25/2019       Assessment & Plan:   Problem List Items Addressed This Visit      Respiratory   URI (upper respiratory infection)    -Rx. augmentin -has right perforated TM which should heal spontaneously         Musculoskeletal and Integument   Strain of muscle, fascia and tendon of lower back, initial encounter    -has pain with ROM exercises -she states she has home muscle relaxers -take tylenol as well PRN for pain          Meds ordered this encounter  Medications  . amoxicillin-clavulanate (AUGMENTIN) 875-125 MG tablet  Sig: Take 1 tablet by mouth 2 (two) times daily.    Dispense:  14 tablet    Refill:  0     Noreene Larsson, NP

## 2020-03-21 NOTE — Patient Instructions (Signed)
For cough, use robitussin DM as the dextromethorphan is a good cough suppressant.

## 2020-03-21 NOTE — Assessment & Plan Note (Signed)
-  has pain with ROM exercises -she states she has home muscle relaxers -take tylenol as well PRN for pain

## 2020-03-21 NOTE — Assessment & Plan Note (Signed)
-  Rx. augmentin -has right perforated TM which should heal spontaneously

## 2020-04-10 ENCOUNTER — Other Ambulatory Visit: Payer: Self-pay | Admitting: Family Medicine

## 2020-05-03 LAB — CMP14+EGFR
ALT: 20 IU/L (ref 0–32)
AST: 27 IU/L (ref 0–40)
Albumin/Globulin Ratio: 1.5 (ref 1.2–2.2)
Albumin: 4.3 g/dL (ref 3.8–4.8)
Alkaline Phosphatase: 99 IU/L (ref 44–121)
BUN/Creatinine Ratio: 16 (ref 12–28)
BUN: 16 mg/dL (ref 8–27)
Bilirubin Total: 0.5 mg/dL (ref 0.0–1.2)
CO2: 24 mmol/L (ref 20–29)
Calcium: 10 mg/dL (ref 8.7–10.3)
Chloride: 98 mmol/L (ref 96–106)
Creatinine, Ser: 1.03 mg/dL — ABNORMAL HIGH (ref 0.57–1.00)
Globulin, Total: 2.8 g/dL (ref 1.5–4.5)
Glucose: 162 mg/dL — ABNORMAL HIGH (ref 65–99)
Potassium: 4.7 mmol/L (ref 3.5–5.2)
Sodium: 138 mmol/L (ref 134–144)
Total Protein: 7.1 g/dL (ref 6.0–8.5)
eGFR: 61 mL/min/{1.73_m2} (ref >59)

## 2020-05-03 LAB — LIPID PANEL
Chol/HDL Ratio: 2.7 ratio (ref 0.0–4.4)
Cholesterol, Total: 142 mg/dL (ref 100–199)
HDL: 52 mg/dL (ref >39)
LDL Chol Calc (NIH): 64 mg/dL (ref 0–99)
Triglycerides: 153 mg/dL — ABNORMAL HIGH (ref 0–149)
VLDL Cholesterol Cal: 26 mg/dL (ref 5–40)

## 2020-05-03 LAB — HEMOGLOBIN A1C
Est. average glucose Bld gHb Est-mCnc: 194 mg/dL
Hgb A1c MFr Bld: 8.4 % — ABNORMAL HIGH (ref 4.8–5.6)

## 2020-05-03 LAB — TSH: TSH: 1.24 u[IU]/mL (ref 0.450–4.500)

## 2020-05-03 LAB — VITAMIN D 25 HYDROXY (VIT D DEFICIENCY, FRACTURES): Vit D, 25-Hydroxy: 34.8 ng/mL (ref 30.0–100.0)

## 2020-05-07 ENCOUNTER — Encounter: Payer: Self-pay | Admitting: Family Medicine

## 2020-05-07 ENCOUNTER — Other Ambulatory Visit: Payer: Self-pay

## 2020-05-07 ENCOUNTER — Ambulatory Visit (INDEPENDENT_AMBULATORY_CARE_PROVIDER_SITE_OTHER): Payer: Medicare Other | Admitting: Family Medicine

## 2020-05-07 ENCOUNTER — Other Ambulatory Visit: Payer: Self-pay | Admitting: Family Medicine

## 2020-05-07 ENCOUNTER — Telehealth: Payer: Self-pay

## 2020-05-07 VITALS — BP 124/77 | HR 91 | Resp 16 | Ht 68.0 in | Wt 303.0 lb

## 2020-05-07 DIAGNOSIS — G8929 Other chronic pain: Secondary | ICD-10-CM

## 2020-05-07 DIAGNOSIS — E1165 Type 2 diabetes mellitus with hyperglycemia: Secondary | ICD-10-CM

## 2020-05-07 DIAGNOSIS — H9201 Otalgia, right ear: Secondary | ICD-10-CM

## 2020-05-07 DIAGNOSIS — E039 Hypothyroidism, unspecified: Secondary | ICD-10-CM

## 2020-05-07 DIAGNOSIS — M25561 Pain in right knee: Secondary | ICD-10-CM | POA: Insufficient documentation

## 2020-05-07 DIAGNOSIS — E785 Hyperlipidemia, unspecified: Secondary | ICD-10-CM

## 2020-05-07 DIAGNOSIS — H9203 Otalgia, bilateral: Secondary | ICD-10-CM

## 2020-05-07 MED ORDER — IBUPROFEN 800 MG PO TABS: 800.0000 mg | ORAL_TABLET | Freq: Three times a day (TID) | ORAL | 0 refills | Status: DC | PRN

## 2020-05-07 MED ORDER — FLONASE 50 MCG/ACT NA SUSP: 2.0000 | Freq: Every day | NASAL | 3 refills | Status: DC

## 2020-05-07 MED ORDER — SULFAMETHOXAZOLE-TRIMETHOPRIM 800-160 MG PO TABS: 1.0000 | ORAL_TABLET | Freq: Two times a day (BID) | ORAL | 0 refills | Status: DC

## 2020-05-07 MED ORDER — GLIPIZIDE ER 10 MG PO TB24: 10.0000 mg | ORAL_TABLET | Freq: Every day | ORAL | 2 refills | Status: DC

## 2020-05-07 MED ORDER — MONTELUKAST SODIUM 10 MG PO TABS: 10.0000 mg | ORAL_TABLET | Freq: Every day | ORAL | 3 refills | Status: DC

## 2020-05-07 NOTE — Progress Notes (Signed)
[name]     MRN: [id number]      DOB: 12/05/1955   HPI [name] is here for follow up and re-evaluation of chronic medical conditions, medication management and review of any available recent lab and radiology data.  Preventive health is updated, specifically  Cancer screening and Immunization.   Slipped on water at client's home 2 days ago, 05/05/2020, twisted rightknee c/o localized medial tenderness, can weight bear Increased itchy and runny nose Reports blood sugar running high Denies polyuria, polydipsia, blurred vision , or hypoglycemic episodes.   ROS Denies recent fever or chills. Denies  ear pain or sore throat. Denies chest congestion, productive cough or wheezing. Denies chest pains, palpitations and leg swelling Denies abdominal pain, nausea, vomiting,diarrhea or constipation.   Denies dysuria, frequency, hesitancy or incontinence.  Denies headaches, seizures,  Denies depression, anxiety or insomnia. Denies skin break down or rash.   PE  BP 124/77   Pulse 91   Resp 16   Ht 5\' 8"  (1.727 m)   Wt (!) 303 lb (137.4 kg)   SpO2 95%   BMI 46.07 kg/m   Patient alert and oriented and in no cardiopulmonary distress.  HEENT: No facial asymmetry, EOMI,     Neck supple .erythema and swelling of exetnal ear canals, right tM mildly erythematous  Chest: Clear to auscultation bilaterally.  CVS: S1, S2 no murmurs, no S3.Regular rate.  ABD: Soft non tender.   Ext: No edema  MS: Adequate though reduced  ROM spine, shoulders, hips and knees.Tender over anterior right knee  Skin: Intact, no ulcerations or rash noted.  Psych: Good eye contact, normal affect. Memory intact not anxious or depressed appearing.  CNS: CN 2-12 intact, power,  normal throughout.no focal deficits noted.   Assessment & Plan  Otalgia of both ears 6 week history, right more than left , refer to ENT and additional antibiotic course  Right knee pain Short course of ibuprofen , if  still persists, refer  To Ortho  Hyperlipidemia Hyperlipidemia:Low fat diet discussed and encouraged.   Lipid Panel  Lab Results  Component Value Date   CHOL 142 05/02/2020   HDL 52 05/02/2020   LDLCALC 64 05/02/2020   TRIG 153 (H) 05/02/2020   CHOLHDL 2.7 05/02/2020     Needs to lower fat intake  Type 2 diabetes mellitus with hyperglycemia ([place]) [name] is reminded of the importance of commitment to daily physical activity for 30 minutes or more, as able and the need to limit carbohydrate intake to 30 to 60 grams per meal to help with blood sugar control.   The need to take medication as prescribed, test blood sugar as directed, and to call between visits if there is a concern that blood sugar is uncontrolled is also discussed.   [name] is reminded of the importance of daily foot exam, annual eye examination, and good blood sugar, blood pressure and cholesterol control. Uncontrolled , inc dose of glipizide  Diabetic Labs Latest Ref Rng & Units 05/02/2020 12/25/2019 09/25/2019 08/08/2019 12/07/2018  HbA1c 4.8 - 5.6 % 8.4(H) 7.1(H) 7.6(A) - 7.0(H)  Microalbumin mg/dL - - - 0.2 -  Micro/Creat Ratio <30 mcg/mg creat - - - 6 -  Chol 100 - 199 mg/dL 142 132 - 140 169  HDL >39 mg/dL 52 50 - 53 43(L)  Calc LDL 0 - 99 mg/dL 64 57 - 66 96  Triglycerides 0 - 149 mg/dL 153(H) 145 - 131 209(H)  Creatinine 0.57 -  1.00 mg/dL 1.03(H) 0.85 - 1.01(H) 1.06(H)   BP/Weight 05/07/2020 03/21/2020 12/26/2019 12/24/2019 09/25/2019 09/17/2019 5/64/3329  Systolic BP 518 841 660 630 160 109 -  Diastolic BP 77 78 65 70 70 78 -  Wt. (Lbs) 303 301 295 284 289 290.6 294  BMI 46.07 45.77 44.85 43.18 43.94 44.19 44.7   Foot/eye exam completion dates Latest Ref Rng & Units 12/26/2019 09/28/2019  Eye Exam No Retinopathy - No Retinopathy  Foot Form Completion - Done -        Morbid obesity  Patient re-educated about  the importance of commitment to a  minimum of 150 minutes of exercise per week as  able.  The importance of healthy food choices with portion control discussed, as well as eating regularly and within a 12 hour window most days. The need to choose "clean , green" food 50 to 75% of the time is discussed, as well as to make water the primary drink and set a goal of 64 ounces water daily.    Weight /BMI 05/07/2020 03/21/2020 12/26/2019  WEIGHT 303 lb 301 lb 295 lb  HEIGHT 5\' 8"  5\' 8"  5\' 8"   BMI 46.07 kg/m2 45.77 kg/m2 44.85 kg/m2      Hypothyroidism Controlled, no change in medication Updated lab needed at/ before next visit.

## 2020-05-07 NOTE — Assessment & Plan Note (Signed)
Controlled, no change in medication Updated lab needed at/ before next visit.  

## 2020-05-07 NOTE — Patient Instructions (Signed)
F/U with pap July 13, or shortly after, glyco HB  At visit   You are referred to ENT for right ear pain and antibiotic is prescribed  Ibuprofen is prescribed for knee pain call for Ortho referral if persists  New higher dose of glipizide 10 mg once daily , may take two 5 mg tablets together until done  Check  Fasting blood sugar every day and record, range is 80to 130  It is important that you exercise regularly at least 30 minutes 5 times a week. If you develop chest pain, have severe difficulty breathing, or feel very tired, stop exercising immediately and seek medical attention  Think about what you will eat, plan ahead. Choose " clean, green, fresh or frozen" over canned, processed or packaged foods which are more sugary, salty and fatty. 70 to 75% of food eaten should be vegetables and fruit. Three meals at set times with snacks allowed between meals, but they must be fruit or vegetables. Aim to eat over a 12 hour period , example 7 am to 7 pm, and STOP after  your last meal of the day. Drink water,generally about 64 ounces per day, no other drink is as healthy. Fruit juice is best enjoyed in a healthy way, by EATING the fruit. Thanks for choosing [facility], we consider it a privelige to serve you.

## 2020-05-07 NOTE — Assessment & Plan Note (Signed)
  Patient re-educated about  the importance of commitment to a  minimum of 150 minutes of exercise per week as able.  The importance of healthy food choices with portion control discussed, as well as eating regularly and within a 12 hour window most days. The need to choose "clean , green" food 50 to 75% of the time is discussed, as well as to make water the primary drink and set a goal of 64 ounces water daily.    Weight /BMI 05/07/2020 03/21/2020 12/26/2019  WEIGHT 303 lb 301 lb 295 lb  HEIGHT 5\' 8"  5\' 8"  5\' 8"   BMI 46.07 kg/m2 45.77 kg/m2 44.85 kg/m2

## 2020-05-07 NOTE — Assessment & Plan Note (Signed)
Short course of ibuprofen , if still persists, refer  To Ortho

## 2020-05-07 NOTE — Assessment & Plan Note (Signed)
[name] is reminded of the importance of commitment to daily physical activity for 30 minutes or more, as able and the need to limit carbohydrate intake to 30 to 60 grams per meal to help with blood sugar control.   The need to take medication as prescribed, test blood sugar as directed, and to call between visits if there is a concern that blood sugar is uncontrolled is also discussed.   [name] is reminded of the importance of daily foot exam, annual eye examination, and good blood sugar, blood pressure and cholesterol control. Uncontrolled , inc dose of glipizide  Diabetic Labs Latest Ref Rng & Units 05/02/2020 12/25/2019 09/25/2019 08/08/2019 12/07/2018  HbA1c 4.8 - 5.6 % 8.4(H) 7.1(H) 7.6(A) - 7.0(H)  Microalbumin mg/dL - - - 0.2 -  Micro/Creat Ratio <30 mcg/mg creat - - - 6 -  Chol 100 - 199 mg/dL 142 132 - 140 169  HDL >39 mg/dL 52 50 - 53 43(L)  Calc LDL 0 - 99 mg/dL 64 57 - 66 96  Triglycerides 0 - 149 mg/dL 153(H) 145 - 131 209(H)  Creatinine 0.57 - 1.00 mg/dL 1.03(H) 0.85 - 1.01(H) 1.06(H)   BP/Weight 05/07/2020 03/21/2020 12/26/2019 12/24/2019 09/25/2019 09/17/2019 1/69/6789  Systolic BP 381 017 510 258 527 782 -  Diastolic BP 77 78 65 70 70 78 -  Wt. (Lbs) 303 301 295 284 289 290.6 294  BMI 46.07 45.77 44.85 43.18 43.94 44.19 44.7   Foot/eye exam completion dates Latest Ref Rng & Units 12/26/2019 09/28/2019  Eye Exam No Retinopathy - No Retinopathy  Foot Form Completion - Done -

## 2020-05-07 NOTE — Assessment & Plan Note (Signed)
Hyperlipidemia:Low fat diet discussed and encouraged.   Lipid Panel  Lab Results  Component Value Date   CHOL 142 05/02/2020   HDL 52 05/02/2020   LDLCALC 64 05/02/2020   TRIG 153 (H) 05/02/2020   CHOLHDL 2.7 05/02/2020     Needs to lower fat intake

## 2020-05-07 NOTE — Telephone Encounter (Signed)
Matt from [name] called said the Flonase 50 mcg not available, substituted generic for this and he informed the patient and patient okay with this.  Matt called to let Dr Moshe Cipro know. Any questions contact Matt at [phone].

## 2020-05-07 NOTE — Assessment & Plan Note (Signed)
6 week history, right more than left , refer to ENT and additional antibiotic course

## 2020-05-23 ENCOUNTER — Ambulatory Visit
Admission: RE | Admit: 2020-05-23 | Discharge: 2020-05-23 | Disposition: A | Discharge status: Home / Self Care | Payer: Medicare Other | Source: Ambulatory Visit | Attending: Anesthesiology | Admitting: Anesthesiology

## 2020-05-23 ENCOUNTER — Other Ambulatory Visit: Payer: Self-pay | Admitting: Anesthesiology

## 2020-05-23 ENCOUNTER — Other Ambulatory Visit: Payer: Self-pay

## 2020-05-23 DIAGNOSIS — M25561 Pain in right knee: Secondary | ICD-10-CM

## 2020-05-23 DIAGNOSIS — W19XXXA Unspecified fall, initial encounter: Secondary | ICD-10-CM

## 2020-05-23 DIAGNOSIS — M546 Pain in thoracic spine: Secondary | ICD-10-CM

## 2020-05-26 ENCOUNTER — Other Ambulatory Visit: Payer: Self-pay | Admitting: Family Medicine

## 2020-05-26 DIAGNOSIS — Z1231 Encounter for screening mammogram for malignant neoplasm of breast: Secondary | ICD-10-CM

## 2020-06-02 ENCOUNTER — Other Ambulatory Visit: Payer: Self-pay

## 2020-06-02 ENCOUNTER — Ambulatory Visit (INDEPENDENT_AMBULATORY_CARE_PROVIDER_SITE_OTHER): Payer: Medicare Other | Admitting: Otolaryngology

## 2020-06-02 ENCOUNTER — Encounter (INDEPENDENT_AMBULATORY_CARE_PROVIDER_SITE_OTHER): Payer: Self-pay | Admitting: Otolaryngology

## 2020-06-02 VITALS — Temp 97.2°F

## 2020-06-02 DIAGNOSIS — H7401 Tympanosclerosis, right ear: Secondary | ICD-10-CM

## 2020-06-02 NOTE — Progress Notes (Signed)
HPI: [name] is a 65 y.o. female who presents is referred by by her PCP for evaluation of questionable tympanic membrane perforation.  She reports that she had a lot of ear infections when she was younger.  She denies any drainage from her ears.  She has not noted any hearing problems.  She does have occasional itching in the ears.  When she was seen by her PCP Dr. Moshe Cipro she was diagnosed with a hole in the right ear canal.  She presents here for follow-up.Marland Kitchen  Past Medical History:  Diagnosis Date  . Allergy   . Anemia   . Arthritis   . Cataract    forming  . Diabetes mellitus without complication ([place]) 1027  . GERD (gastroesophageal reflux disease)   . Hyperlipidemia   . Hypertension 2011  . Morbid obesity ([place])   . Neuromuscular disorder ([place])    issues with nerves in back   . Osteopenia   . Thyroid disease    Past Surgical History:  Procedure Laterality Date  . ABDOMINAL HYSTERECTOMY    . APPENDECTOMY    . CHOLECYSTECTOMY    . COLONOSCOPY  2009   [facility] - normal per pt   . TONSILLECTOMY     Social History   Socioeconomic History  . Marital status: Married    Spouse name: Barnie Alderman   . Number of children: 2  . Years of education: 45  . Highest education level: 12th grade  Occupational History  . Occupation: disabled   Tobacco Use  . Smoking status: Never Smoker  . Smokeless tobacco: Never Used  Substance and Sexual Activity  . Alcohol use: No  . Drug use: No  . Sexual activity: Not Currently  Other Topics Concern  . Not on file  Social History Narrative   Lives with Husband alone    Social Determinants of Health   Financial Resource Strain: Low Risk   . Difficulty of Paying Living Expenses: Not hard at all  Food Insecurity: No Food Insecurity  . Worried About Charity fundraiser in the Last Year: Never true  . Ran Out of Food in the Last Year: Never true  Transportation Needs: No Transportation Needs  . Lack of Transportation (Medical): No  .  Lack of Transportation (Non-Medical): No  Physical Activity: Insufficiently Active  . Days of Exercise per Week: 4 days  . Minutes of Exercise per Session: 30 min  Stress: No Stress Concern Present  . Feeling of Stress : Not at all  Social Connections: Moderately Isolated  . Frequency of Communication with Friends and Family: More than three times a week  . Frequency of Social Gatherings with Friends and Family: Not on file  . Attends Religious Services: More than 4 times per year  . Active Member of Clubs or Organizations: No  . Attends Archivist Meetings: Never  . Marital Status: Separated   Family History  Problem Relation Age of Onset  . Asthma Mother   . Hypertension Mother   . Heart disease Mother 103       cabg  . Diabetes Father   . Hypertension Father   . Heart disease Father 46       cabg  . Cancer Sister        breast, uterine  . Cancer Maternal Grandmother   . Cancer Paternal Grandfather   . Cancer Sister   . Prostate cancer Paternal Grandmother   . Colon polyps Neg Hx   .  Colon cancer Neg Hx   . Esophageal cancer Neg Hx   . Rectal cancer Neg Hx   . Stomach cancer Neg Hx    Allergies  Allergen Reactions  . Naproxen Sodium Hives and Swelling    MD thought it was the pink dye in the naproxen  . Tsh [Thyrotropin]   . Statins Other (See Comments)    Reports increased appetite and possible muscle ache   Prior to Admission medications   Medication Sig Start Date End Date Taking? Authorizing Provider  aspirin 81 MG tablet Take 81 mg by mouth daily.    [provider]  benazepril (LOTENSIN) 5 MG tablet TAKE TWO (2) TABLETS BY MOUTH EVERY DAY 04/10/20   Fayrene Helper, MD  blood glucose meter kit and supplies Dispense based on patient and insurance preference. Once daily testing dx e11.9 08/14/19   Fayrene Helper, MD  calcium-vitamin D (OSCAL WITH D) 500-200 MG-UNIT per tablet Take 1 tablet by mouth daily.    [provider]   cyclobenzaprine (FLEXERIL) 5 MG tablet TAKE ONE TABLET BY MOUTH AT BEDTIME AS NEEDED. SHOULD LAST SIX MONTHS. 02/28/15   Fayrene Helper, MD  Dexlansoprazole 30 MG capsule Take 30 mg by mouth daily.    [provider]  FLONASE 50 MCG/ACT nasal spray Place 2 sprays into both nostrils daily. 05/07/20   Fayrene Helper, MD  furosemide (LASIX) 20 MG tablet TAKE ONE TABLET BY MOUTH ONCE DAILY FOR LEG SWELLING. 05/07/20   Fayrene Helper, MD  gabapentin (NEURONTIN) 300 MG capsule TAKE ONE CAPSULE BY MOUTH TWICE DAILY 10/05/19   Fayrene Helper, MD  glipiZIDE (GLUCOTROL XL) 10 MG 24 hr tablet Take 1 tablet (10 mg total) by mouth daily with breakfast. 05/07/20   Fayrene Helper, MD  glucose blood [facility] VERIO) test strip Use as instructed once daily testing dx e11.9 11/15/19   Fayrene Helper, MD  ibuprofen (ADVIL) 800 MG tablet Take 1 tablet (800 mg total) by mouth every 8 (eight) hours as needed. 05/07/20   Fayrene Helper, MD  levothyroxine (SYNTHROID) 150 MCG tablet TAKE ONE TABLET BY MOUTH DAILY. 04/10/20   Fayrene Helper, MD  metFORMIN (GLUCOPHAGE) 1000 MG tablet Take 1 tablet (1,000 mg total) by mouth 2 (two) times daily with a meal. 12/26/19   Fayrene Helper, MD  montelukast (SINGULAIR) 10 MG tablet Take 1 tablet (10 mg total) by mouth at bedtime. 05/07/20   Fayrene Helper, MD  OneTouch Delica Lancets 30Z [place] Once daily testing dx e11.9 11/15/19   Fayrene Helper, MD  oxyCODONE-acetaminophen (PERCOCET) 7.5-325 MG tablet Take one tablet once daily , as needed , for uncontrolled back pain 03/17/18   Fayrene Helper, MD  potassium chloride SA (KLOR-CON) 20 MEQ tablet TAKE ONE TABLET BY MOUTH ONCE DAILY AS NEEDED ON DAYS YOU TAKE FUROSEMIDE FOR SWELLING. 05/07/20   Fayrene Helper, MD  rosuvastatin (CRESTOR) 40 MG tablet TAKE ONE TABLET BY MOUTH DAILY. 01/02/20   Fayrene Helper, MD  sulfamethoxazole-trimethoprim (BACTRIM DS) 800-160 MG tablet  Take 1 tablet by mouth 2 (two) times daily. 05/07/20   Fayrene Helper, MD  traZODone (DESYREL) 100 MG tablet TAKE ONE TABLET BY MOUTH AT BEDTIME 05/07/20   Fayrene Helper, MD     Positive ROS: Otherwise negative  All other systems have been reviewed and were otherwise negative with the exception of those mentioned in the HPI and as above.  Physical  Exam: Constitutional: Alert, well-appearing, no acute distress Ears: External ears without lesions or tenderness. Ear canals are clear bilaterally.  She has scar tissue on both TMs with tympanosclerosis.  On visualization of the right TM she has a monomeric membrane over the posterior TM that appears to represent a hole but is intact.  TMs have good mobility on pneumatic otoscopy.  Hearing screening with the 512 1024 tuning fork she had good hearing in both ears with AC > BC bilaterally. Nasal: External nose without lesions. Septum relatively midline with mild rhinitis.. Clear nasal passages Oral: Lips and gums without lesions. Tongue and palate mucosa without lesions. Posterior oropharynx clear. Neck: No palpable adenopathy or masses Respiratory: Breathing comfortably  Skin: No facial/neck lesions or rash noted.  Procedures  Assessment: I discussed with the patient that she has some scar tissue on her TMs and a thin membrane or monomeric membrane involving the right TM but there is no hole in the eardrum.  Plan: I discussed with her today concerning intact TMs bilaterally. She is not having any hearing problems. She used to have ear infections but has not had any ear infections recently. She will follow-up as needed.   Radene Journey, MD   CC:

## 2020-06-11 ENCOUNTER — Other Ambulatory Visit: Payer: Self-pay | Admitting: Family Medicine

## 2020-07-05 ENCOUNTER — Other Ambulatory Visit: Payer: Self-pay | Admitting: Family Medicine

## 2020-07-07 ENCOUNTER — Other Ambulatory Visit: Payer: Self-pay | Admitting: Family Medicine

## 2020-07-07 ENCOUNTER — Other Ambulatory Visit: Payer: Self-pay

## 2020-07-07 MED ORDER — MONTELUKAST SODIUM 10 MG PO TABS: 1.0000 | ORAL_TABLET | Freq: Every day | ORAL | 1 refills | Status: DC

## 2020-07-07 MED ORDER — TRAZODONE HCL 100 MG PO TABS: 100.0000 mg | ORAL_TABLET | Freq: Every day | ORAL | 1 refills | Status: DC

## 2020-07-16 ENCOUNTER — Ambulatory Visit
Admission: RE | Admit: 2020-07-16 | Discharge: 2020-07-16 | Disposition: A | Discharge status: Home / Self Care | Payer: Medicare Other | Source: Ambulatory Visit | Attending: Family Medicine | Admitting: Family Medicine

## 2020-07-16 ENCOUNTER — Other Ambulatory Visit: Payer: Self-pay

## 2020-07-16 DIAGNOSIS — Z1231 Encounter for screening mammogram for malignant neoplasm of breast: Secondary | ICD-10-CM

## 2020-07-16 IMAGING — MR MR KNEE*L* W/O CM
6 series · 37 of 40 positions shown · non-contrast
Comparison: Plain films left knee 03/09/2018.

CLINICAL DATA: Diffuse left knee pain for the past month. No known
injury.

EXAM:
MRI OF THE LEFT KNEE WITHOUT CONTRAST
TECHNIQUE: Multiplanar, multisequence MR imaging of the knee was performed. No
intravenous contrast was administered.

[Series 4: T2 fat-sat · coronal · left · 3.0mm · 0.47mm/px · 7 of 38 slices shown (1 of 3)]
[im 1/38]
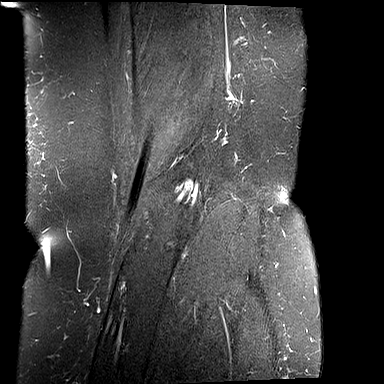
[im 7/38]
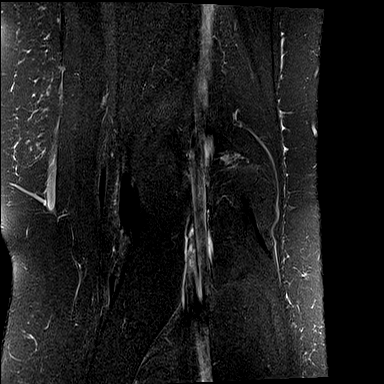
[im 13/38]
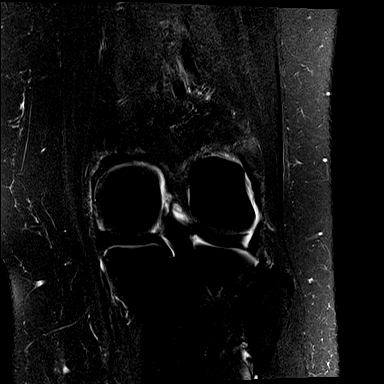
[im 19/38]
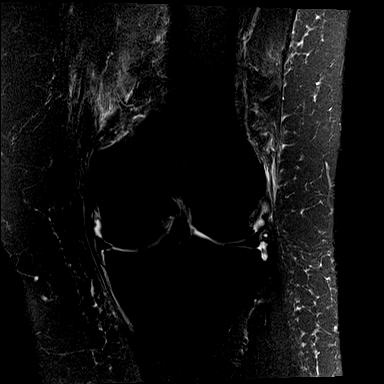
[im 25/38]
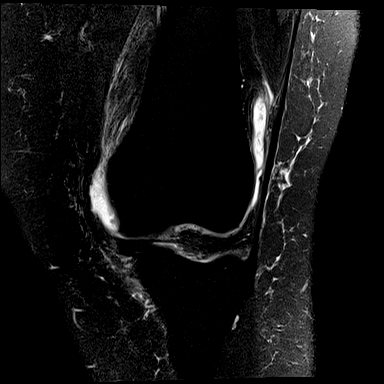
[im 31/38]
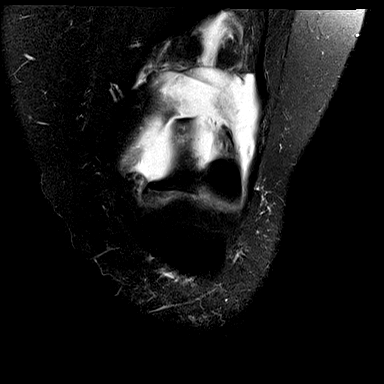
[im 38/38]
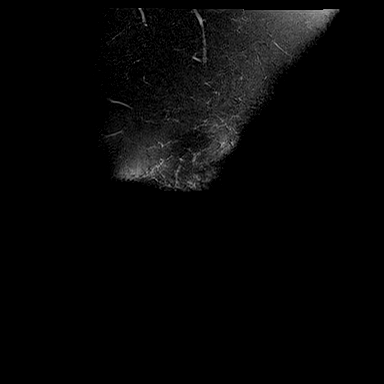

[Series 6: T1 · coronal · left · 3.0mm · 0.47mm/px · 4 of 38 slices shown]
[im 1/38]
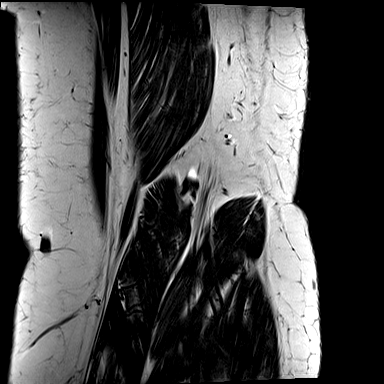
[im 7/38]
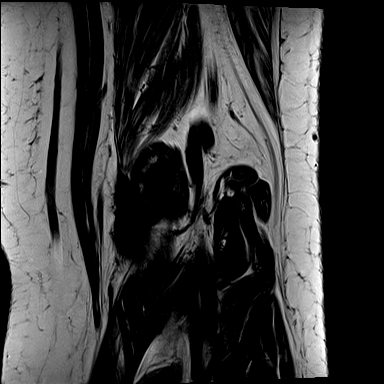
[im 13/38]
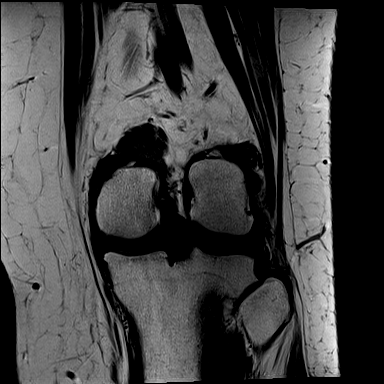
[im 19/38]
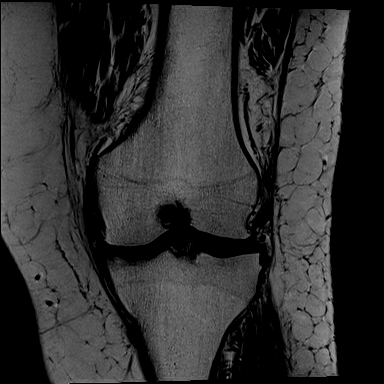

[Series 7: PD fat-sat · coronal · left · 3.0mm · 0.47mm/px · 7 of 38 slices shown (1 of 2)]
[im 1/38]
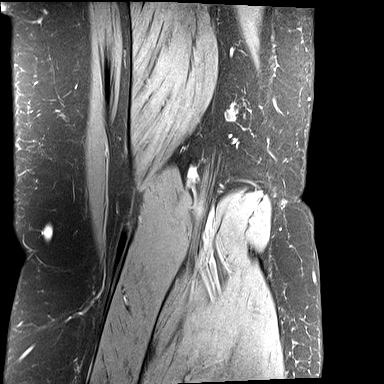
[im 7/38]
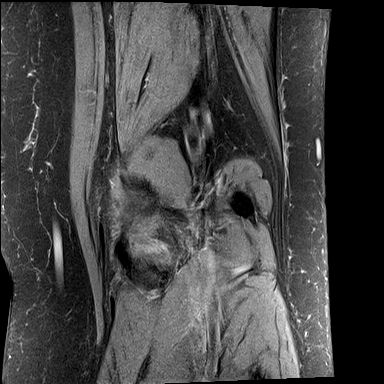
[im 13/38]
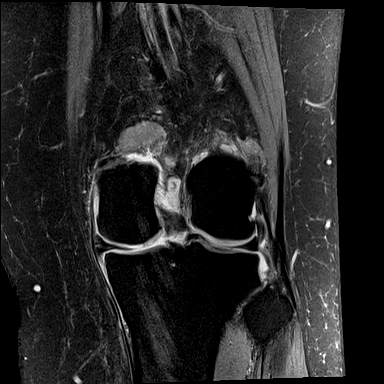
[im 19/38]
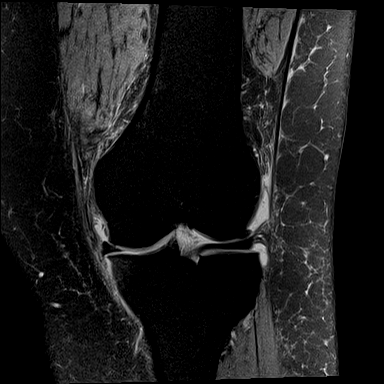
[im 25/38]
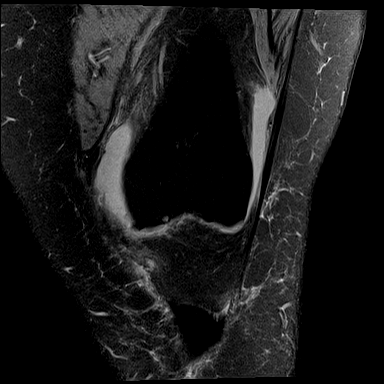
[im 31/38]
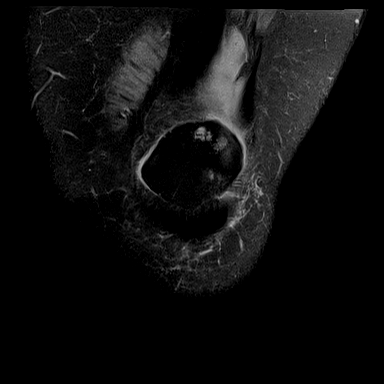
[im 38/38]
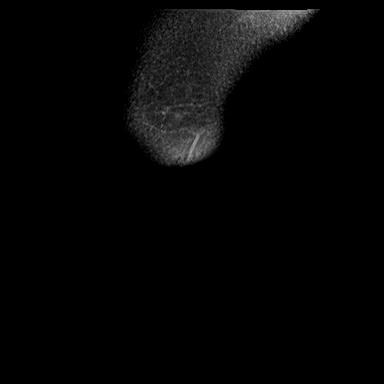

[Series 8: PD fat-sat · sagittal · left · 3.2mm · 0.56mm/px · 6 of 31 slices shown (2 of 2)]
[im 1/31]
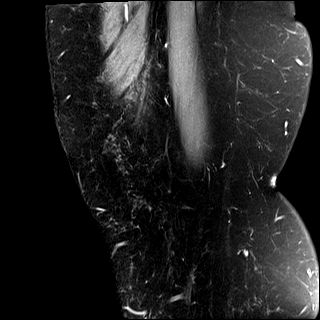
[im 7/31]
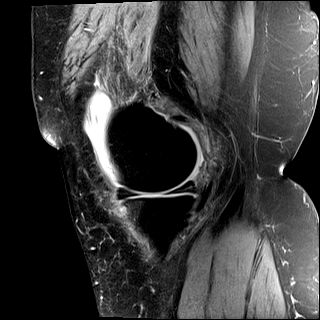
[im 13/31]
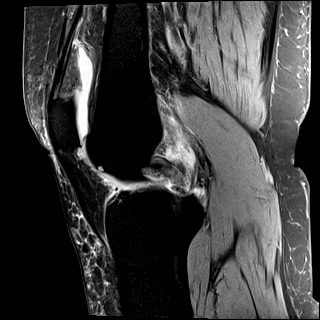
[im 19/31]
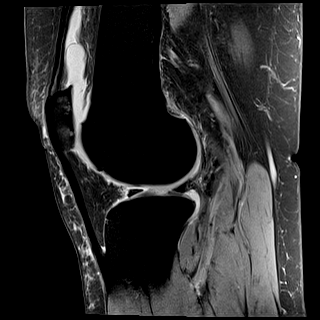
[im 25/31]
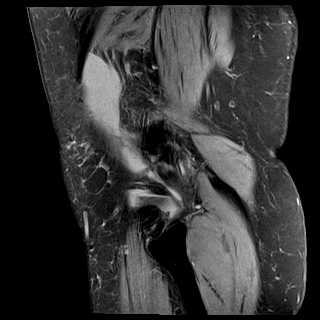
[im 31/31]
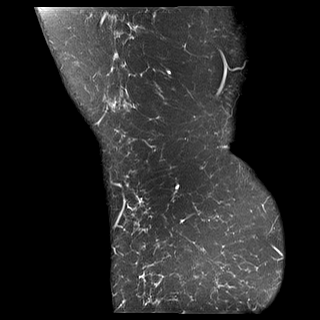

[Series 9: T2 fat-sat · sagittal · left · 3.2mm · 0.47mm/px · 6 of 31 slices shown (2 of 3)]
[im 1/31]
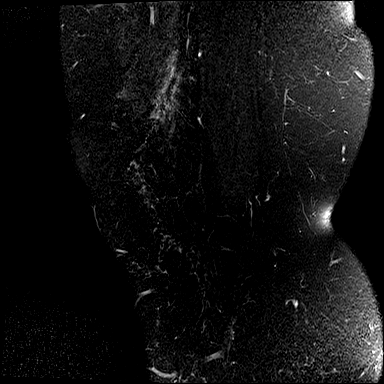
[im 7/31]
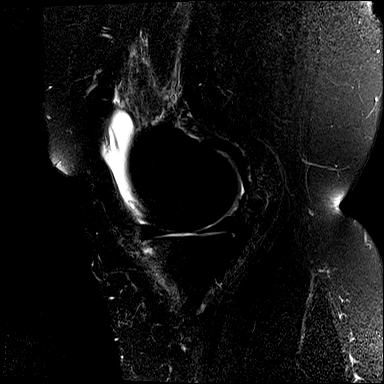
[im 13/31]
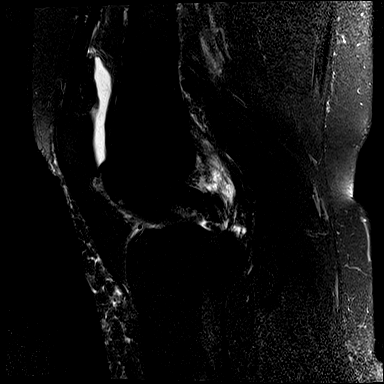
[im 19/31]
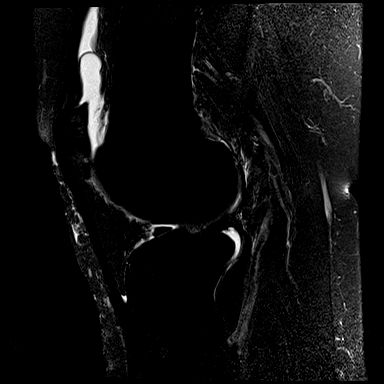
[im 25/31]
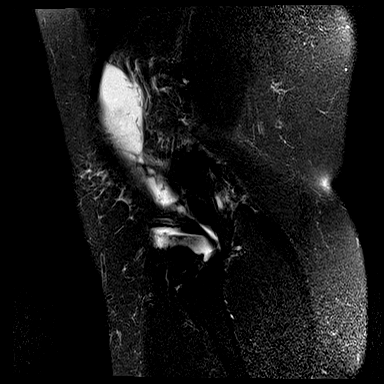
[im 31/31]
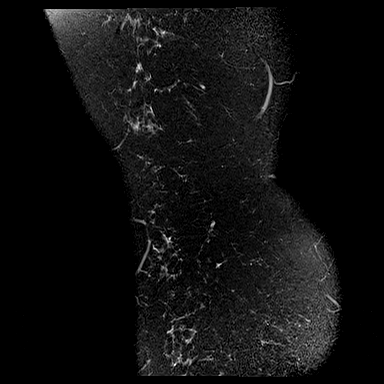

[Series 10: T2 fat-sat · axial · left · 4.0mm · 0.50mm/px · z∈[-56,+98]mm · 7 of 36 slices shown (3 of 3)]
[im 1/36]
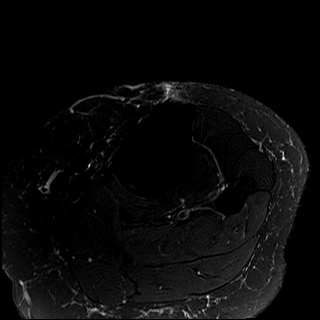
[im 6/36]
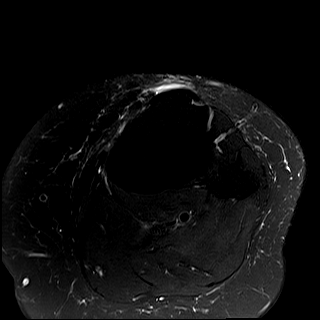
[im 12/36]
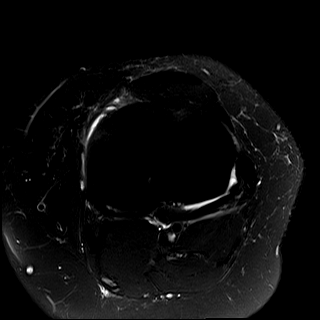
[im 18/36]
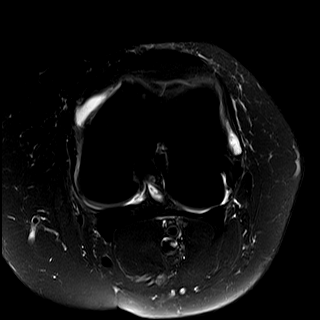
[im 24/36]
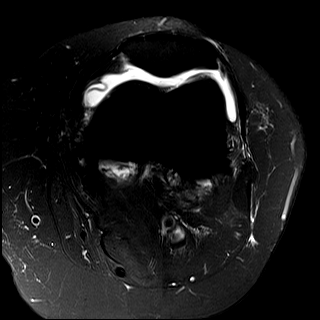
[im 30/36]
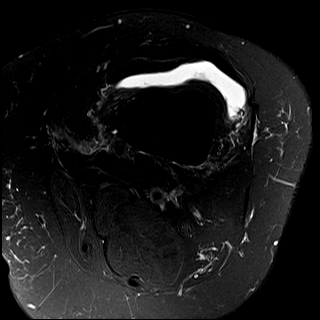
[im 36/36]
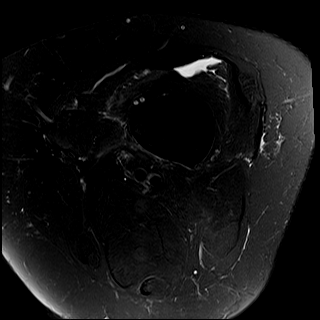

[37 of 40 positions shown; findings below may reference images not displayed]

FINDINGS: MENISCI

Medial meniscus: Intact. Mild fraying is seen along the free edge of
the posterior horn and body.

Lateral meniscus: Intact. There is some fraying along the free edge
of the body.

LIGAMENTS

Cruciates:  Intact.

Collaterals:  Intact.

CARTILAGE

Patellofemoral: Cartilage loss is worst in the superior pole at the
apex and along the lateral facet.

Medial:  Moderately degenerated.

Lateral:  Mildly to moderately degenerated.

Joint:  Small to moderate effusion.

Popliteal Fossa:  No Baker's cyst.

Extensor Mechanism:  Intact.

Bones: No fracture or worrisome lesion. Small osteophytes are seen
about the knee. Subchondral cyst formation is also seen in the
superior pole of the patella along the lateral facet.

Other: None.
IMPRESSION: Negative for meniscal or ligament tear. There is some fraying along
the free edge of the posterior horn and body of the medial meniscus
and body of the lateral meniscus.

Mild to moderate osteoarthritis most notable in the patellofemoral
compartment.

## 2020-07-17 ENCOUNTER — Telehealth: Payer: Self-pay

## 2020-07-17 NOTE — Telephone Encounter (Signed)
Patient called with a question about a new medication requesting to speak with a nurse ph# [phone]

## 2020-07-17 NOTE — Telephone Encounter (Signed)
Pt wanted to confirm what montelukast was. Advised this is generic singulair.

## 2020-07-17 NOTE — Telephone Encounter (Signed)
Left message

## 2020-08-07 ENCOUNTER — Other Ambulatory Visit: Payer: Self-pay

## 2020-08-07 ENCOUNTER — Ambulatory Visit (INDEPENDENT_AMBULATORY_CARE_PROVIDER_SITE_OTHER): Payer: Medicare Other | Admitting: Family Medicine

## 2020-08-07 ENCOUNTER — Encounter: Payer: Self-pay | Admitting: Family Medicine

## 2020-08-07 ENCOUNTER — Other Ambulatory Visit (HOSPITAL_COMMUNITY)
Admission: RE | Admit: 2020-08-07 | Discharge: 2020-08-07 | Disposition: A | Discharge status: Home / Self Care | Payer: Medicare Other | Source: Ambulatory Visit | Attending: Family Medicine | Admitting: Family Medicine

## 2020-08-07 VITALS — BP 131/74 | HR 88 | Temp 98.0°F | Resp 20 | Ht 68.0 in | Wt 300.0 lb

## 2020-08-07 DIAGNOSIS — Z1151 Encounter for screening for human papillomavirus (HPV): Secondary | ICD-10-CM | POA: Diagnosis not present

## 2020-08-07 DIAGNOSIS — Z01419 Encounter for gynecological examination (general) (routine) without abnormal findings: Secondary | ICD-10-CM | POA: Diagnosis not present

## 2020-08-07 DIAGNOSIS — E039 Hypothyroidism, unspecified: Secondary | ICD-10-CM | POA: Diagnosis not present

## 2020-08-07 DIAGNOSIS — Z124 Encounter for screening for malignant neoplasm of cervix: Secondary | ICD-10-CM | POA: Diagnosis not present

## 2020-08-07 DIAGNOSIS — I10 Essential (primary) hypertension: Secondary | ICD-10-CM | POA: Diagnosis not present

## 2020-08-07 DIAGNOSIS — E1165 Type 2 diabetes mellitus with hyperglycemia: Secondary | ICD-10-CM | POA: Diagnosis not present

## 2020-08-07 MED ORDER — GLIPIZIDE ER 5 MG PO TB24: 5.0000 mg | ORAL_TABLET | Freq: Every day | ORAL | 1 refills | Status: DC

## 2020-08-07 NOTE — Patient Instructions (Addendum)
Follow-up in 3-1/2 months call if you need me sooner.  Happy birthday in 2 days and wishing you many many more.  Pap sent today.  Microalb today from office  Please reconsider the COVID vaccines are recommended.  Your blood sugar is increased and not as well controlled with your A1c at 8.0.  Please increase vegetables and reduce sugar and starchy foods.  New higher dose of glipizide is 10 mg daily continue this and add 5 mg daily take both together every morning with breakfast.Total new dose is glipizide ER 15 mg once daily at breakfast  Please start testing blood sugar every morning the goal is 80-1 30.  Please get these labs fasting 5 days before your next visit lipid panel CMP and EGFR hemoglobin A1c and TSH.and CBC  It is important that you exercise regularly at least 30 minutes 5 times a week. If you develop chest pain, have severe difficulty breathing, or feel very tired, stop exercising immediately and seek medical attention  Thanks for choosing [facility], we consider it a privelige to serve you.

## 2020-08-08 ENCOUNTER — Encounter: Payer: Self-pay | Admitting: Family Medicine

## 2020-08-08 DIAGNOSIS — Z124 Encounter for screening for malignant neoplasm of cervix: Secondary | ICD-10-CM | POA: Insufficient documentation

## 2020-08-08 NOTE — Assessment & Plan Note (Signed)
Controlled, no change in medication DASH diet and commitment to daily physical activity for a minimum of 30 minutes discussed and encouraged, as a part of hypertension management. The importance of attaining a healthy weight is also discussed.  BP/Weight 08/07/2020 05/07/2020 03/21/2020 12/26/2019 12/24/2019 09/25/2019 4/38/3818  Systolic BP 403 754 360 677 034 035 248  Diastolic BP 74 77 78 65 70 70 78  Wt. (Lbs) 300 303 301 295 284 289 290.6  BMI 45.61 46.07 45.77 44.85 43.18 43.94 44.19

## 2020-08-08 NOTE — Progress Notes (Signed)
[name]     MRN: [id number]      DOB: 1955/12/28   HPI [name] is here for follow up and re-evaluation of chronic medical conditions, medication management and review of any available recent lab and radiology data.  Preventive health is updated, specifically  Cancer screening and Immunization.   Pap due and is being done at visit, no c/o pelvic pain or discharge Adequate pain management through pain clinic Denies polyuria, polydipsia, blurred vision , or hypoglycemic episodes.    ROS Denies recent fever or chills. Denies sinus pressure, nasal congestion, ear pain or sore throat. Denies chest congestion, productive cough or wheezing. Denies chest pains, palpitations and leg swelling Denies abdominal pain, nausea, vomiting,diarrhea or constipation.   Denies dysuria, frequency, hesitancy or incontinence. Denies headaches, seizures, numbness, or tingling. Denies depression, anxiety or insomnia. Denies skin break down or rash.   PE  BP 131/74 (BP Location: Right Arm, Patient Position: Sitting, Cuff Size: Large)   Pulse 88   Temp 98 F (36.7 C)   Resp 20   Ht 5\' 8"  (1.727 m)   Wt 300 lb (136.1 kg)   SpO2 95%   BMI 45.61 kg/m   Patient alert and oriented and in no cardiopulmonary distress.  HEENT: No facial asymmetry, EOMI,     Neck supple .  Chest: Clear to auscultation bilaterally.  CVS: S1, S2 no murmurs, no S3.Regular rate.  ABD: Soft non tender.  Pelvic: Normal ext genitalia, no adenopathy Internal: vaginal walls pink, and moist, no ulcers noted, physiologic discharge, no odor Uterus absent , no adnexal masses,or tenderness  Ext: No edema  MS: decreased  ROM spine,  hips and knees.  Skin: Intact, no ulcerations or rash noted.  Psych: Good eye contact, normal affect. Memory intact not anxious or depressed appearing.  CNS: CN 2-12 intact, power,  normal throughout.no focal deficits noted.   Assessment & Plan  Type 2 diabetes mellitus with  hyperglycemia (HCC) Not at goal, add glipizide 5 mg , total 15 mg daily [name] is reminded of the importance of commitment to daily physical activity for 30 minutes or more, as able and the need to limit carbohydrate intake to 30 to 60 grams per meal to help with blood sugar control.   The need to take medication as prescribed, test blood sugar as directed, and to call between visits if there is a concern that blood sugar is uncontrolled is also discussed.   [name] is reminded of the importance of daily foot exam, annual eye examination, and good blood sugar, blood pressure and cholesterol control.  Diabetic Labs Latest Ref Rng & Units 05/02/2020 12/25/2019 09/25/2019 08/08/2019 12/07/2018  HbA1c 4.8 - 5.6 % 8.4(H) 7.1(H) 7.6(A) - 7.0(H)  Microalbumin mg/dL - - - 0.2 -  Micro/Creat Ratio <30 mcg/mg creat - - - 6 -  Chol 100 - 199 mg/dL 142 132 - 140 169  HDL >39 mg/dL 52 50 - 53 43(L)  Calc LDL 0 - 99 mg/dL 64 57 - 66 96  Triglycerides 0 - 149 mg/dL 153(H) 145 - 131 209(H)  Creatinine 0.57 - 1.00 mg/dL 1.03(H) 0.85 - 1.01(H) 1.06(H)   BP/Weight 08/07/2020 05/07/2020 03/21/2020 12/26/2019 12/24/2019 09/25/2019 07/18/7626  Systolic BP 315 176 160 737 106 269 485  Diastolic BP 74 77 78 65 70 70 78  Wt. (Lbs) 300 303 301 295 284 289 290.6  BMI 45.61 46.07 45.77 44.85 43.18 43.94 44.19   Foot/eye exam completion dates  Latest Ref Rng & Units 12/26/2019 09/28/2019  Eye Exam No Retinopathy - No Retinopathy  Foot Form Completion - Done -        Hypothyroidism Controlled, no change in medication   Hypertension goal BP (blood pressure) < 130/80 Controlled, no change in medication DASH diet and commitment to daily physical activity for a minimum of 30 minutes discussed and encouraged, as a part of hypertension management. The importance of attaining a healthy weight is also discussed.  BP/Weight 08/07/2020 05/07/2020 03/21/2020 12/26/2019 12/24/2019 09/25/2019 5/99/7741  Systolic BP 423 953 202  334 356 861 683  Diastolic BP 74 77 78 65 70 70 78  Wt. (Lbs) 300 303 301 295 284 289 290.6  BMI 45.61 46.07 45.77 44.85 43.18 43.94 44.19       Pap smear for cervical cancer screening Specimen sent  Morbid obesity  Patient re-educated about  the importance of commitment to a  minimum of 150 minutes of exercise per week as able.  The importance of healthy food choices with portion control discussed, as well as eating regularly and within a 12 hour window most days. The need to choose "clean , green" food 50 to 75% of the time is discussed, as well as to make water the primary drink and set a goal of 64 ounces water daily.    Weight /BMI 08/07/2020 05/07/2020 03/21/2020  WEIGHT 300 lb 303 lb 301 lb  HEIGHT 5\' 8"  5\' 8"  5\' 8"   BMI 45.61 kg/m2 46.07 kg/m2 45.77 kg/m2

## 2020-08-08 NOTE — Assessment & Plan Note (Signed)
  Patient re-educated about  the importance of commitment to a  minimum of 150 minutes of exercise per week as able.  The importance of healthy food choices with portion control discussed, as well as eating regularly and within a 12 hour window most days. The need to choose "clean , green" food 50 to 75% of the time is discussed, as well as to make water the primary drink and set a goal of 64 ounces water daily.    Weight /BMI 08/07/2020 05/07/2020 03/21/2020  WEIGHT 300 lb 303 lb 301 lb  HEIGHT 5\' 8"  5\' 8"  5\' 8"   BMI 45.61 kg/m2 46.07 kg/m2 45.77 kg/m2

## 2020-08-08 NOTE — Assessment & Plan Note (Signed)
Specimen sent.

## 2020-08-08 NOTE — Assessment & Plan Note (Signed)
Not at goal, add glipizide 5 mg , total 15 mg daily [name] is reminded of the importance of commitment to daily physical activity for 30 minutes or more, as able and the need to limit carbohydrate intake to 30 to 60 grams per meal to help with blood sugar control.   The need to take medication as prescribed, test blood sugar as directed, and to call between visits if there is a concern that blood sugar is uncontrolled is also discussed.   [name] is reminded of the importance of daily foot exam, annual eye examination, and good blood sugar, blood pressure and cholesterol control.  Diabetic Labs Latest Ref Rng & Units 05/02/2020 12/25/2019 09/25/2019 08/08/2019 12/07/2018  HbA1c 4.8 - 5.6 % 8.4(H) 7.1(H) 7.6(A) - 7.0(H)  Microalbumin mg/dL - - - 0.2 -  Micro/Creat Ratio <30 mcg/mg creat - - - 6 -  Chol 100 - 199 mg/dL 142 132 - 140 169  HDL >39 mg/dL 52 50 - 53 43(L)  Calc LDL 0 - 99 mg/dL 64 57 - 66 96  Triglycerides 0 - 149 mg/dL 153(H) 145 - 131 209(H)  Creatinine 0.57 - 1.00 mg/dL 1.03(H) 0.85 - 1.01(H) 1.06(H)   BP/Weight 08/07/2020 05/07/2020 03/21/2020 12/26/2019 12/24/2019 09/25/2019 6/70/1410  Systolic BP 301 314 388 875 797 282 060  Diastolic BP 74 77 78 65 70 70 78  Wt. (Lbs) 300 303 301 295 284 289 290.6  BMI 45.61 46.07 45.77 44.85 43.18 43.94 44.19   Foot/eye exam completion dates Latest Ref Rng & Units 12/26/2019 09/28/2019  Eye Exam No Retinopathy - No Retinopathy  Foot Form Completion - Done -

## 2020-08-08 NOTE — Assessment & Plan Note (Signed)
Controlled, no change in medication  

## 2020-08-09 LAB — MICROALBUMIN / CREATININE URINE RATIO
Creatinine, Urine: 48.1 mg/dL
Microalb/Creat Ratio: <6 mg/g creat (ref 0–29)
Microalbumin, Urine: <3 ug/mL

## 2020-08-12 LAB — CYTOLOGY - PAP
Adequacy: ABSENT
Comment: NEGATIVE
Diagnosis: NEGATIVE
High risk HPV: NEGATIVE

## 2020-08-13 DIAGNOSIS — M47816 Spondylosis without myelopathy or radiculopathy, lumbar region: Secondary | ICD-10-CM | POA: Diagnosis not present

## 2020-08-13 DIAGNOSIS — M150 Primary generalized (osteo)arthritis: Secondary | ICD-10-CM | POA: Diagnosis not present

## 2020-08-13 DIAGNOSIS — G894 Chronic pain syndrome: Secondary | ICD-10-CM | POA: Diagnosis not present

## 2020-08-13 DIAGNOSIS — M47812 Spondylosis without myelopathy or radiculopathy, cervical region: Secondary | ICD-10-CM | POA: Diagnosis not present

## 2020-09-02 ENCOUNTER — Telehealth: Payer: Self-pay

## 2020-09-02 NOTE — Telephone Encounter (Signed)
Pt broke out in hives last night, wants to make sure she can take benadryl with her current medications?

## 2020-09-02 NOTE — Telephone Encounter (Signed)
That should be fine. 

## 2020-09-02 NOTE — Telephone Encounter (Signed)
Pt informed

## 2020-09-15 ENCOUNTER — Other Ambulatory Visit: Payer: Self-pay | Admitting: Family Medicine

## 2020-10-02 ENCOUNTER — Other Ambulatory Visit: Payer: Self-pay | Admitting: Family Medicine

## 2020-10-06 ENCOUNTER — Other Ambulatory Visit: Payer: Self-pay | Admitting: Family Medicine

## 2020-10-24 ENCOUNTER — Telehealth: Payer: Self-pay | Admitting: Family Medicine

## 2020-10-24 NOTE — Telephone Encounter (Signed)
Patient called in reference to a prescription refill for Dexilant 30mg  . Says that she picked it up from the office the last time is was filled.

## 2020-10-24 NOTE — Telephone Encounter (Signed)
Pt aware not at our office

## 2020-11-06 DIAGNOSIS — H2513 Age-related nuclear cataract, bilateral: Secondary | ICD-10-CM | POA: Diagnosis not present

## 2020-11-06 DIAGNOSIS — H524 Presbyopia: Secondary | ICD-10-CM | POA: Diagnosis not present

## 2020-11-06 DIAGNOSIS — E1136 Type 2 diabetes mellitus with diabetic cataract: Secondary | ICD-10-CM | POA: Diagnosis not present

## 2020-11-06 LAB — HM DIABETES EYE EXAM

## 2020-11-11 DIAGNOSIS — M150 Primary generalized (osteo)arthritis: Secondary | ICD-10-CM | POA: Diagnosis not present

## 2020-11-11 DIAGNOSIS — M47812 Spondylosis without myelopathy or radiculopathy, cervical region: Secondary | ICD-10-CM | POA: Diagnosis not present

## 2020-11-11 DIAGNOSIS — G894 Chronic pain syndrome: Secondary | ICD-10-CM | POA: Diagnosis not present

## 2020-11-11 DIAGNOSIS — E1165 Type 2 diabetes mellitus with hyperglycemia: Secondary | ICD-10-CM | POA: Diagnosis not present

## 2020-11-11 DIAGNOSIS — M47816 Spondylosis without myelopathy or radiculopathy, lumbar region: Secondary | ICD-10-CM | POA: Diagnosis not present

## 2020-11-12 LAB — CBC
Hematocrit: 40.4 % (ref 34.0–46.6)
Hemoglobin: 13.5 g/dL (ref 11.1–15.9)
MCH: 30.1 pg (ref 26.6–33.0)
MCHC: 33.4 g/dL (ref 31.5–35.7)
MCV: 90 fL (ref 79–97)
Platelets: 336 10*3/uL (ref 150–450)
RBC: 4.49 x10E6/uL (ref 3.77–5.28)
RDW: 13.5 % (ref 11.7–15.4)
WBC: 9 10*3/uL (ref 3.4–10.8)

## 2020-11-12 LAB — LIPID PANEL
Chol/HDL Ratio: 2.6 ratio (ref 0.0–4.4)
Cholesterol, Total: 128 mg/dL (ref 100–199)
HDL: 49 mg/dL (ref >39)
LDL Chol Calc (NIH): 54 mg/dL (ref 0–99)
Triglycerides: 145 mg/dL (ref 0–149)
VLDL Cholesterol Cal: 25 mg/dL (ref 5–40)

## 2020-11-12 LAB — CMP14+EGFR
ALT: 17 IU/L (ref 0–32)
AST: 24 IU/L (ref 0–40)
Albumin/Globulin Ratio: 1.6 (ref 1.2–2.2)
Albumin: 4.1 g/dL (ref 3.8–4.8)
Alkaline Phosphatase: 82 IU/L (ref 44–121)
BUN/Creatinine Ratio: 13 (ref 12–28)
BUN: 12 mg/dL (ref 8–27)
Bilirubin Total: 0.5 mg/dL (ref 0.0–1.2)
CO2: 25 mmol/L (ref 20–29)
Calcium: 10 mg/dL (ref 8.7–10.3)
Chloride: 102 mmol/L (ref 96–106)
Creatinine, Ser: 0.9 mg/dL (ref 0.57–1.00)
Globulin, Total: 2.5 g/dL (ref 1.5–4.5)
Glucose: 131 mg/dL — ABNORMAL HIGH (ref 70–99)
Potassium: 5 mmol/L (ref 3.5–5.2)
Sodium: 142 mmol/L (ref 134–144)
Total Protein: 6.6 g/dL (ref 6.0–8.5)
eGFR: 71 mL/min/{1.73_m2} (ref >59)

## 2020-11-12 LAB — HEMOGLOBIN A1C
Est. average glucose Bld gHb Est-mCnc: 174 mg/dL
Hgb A1c MFr Bld: 7.7 % — ABNORMAL HIGH (ref 4.8–5.6)

## 2020-11-12 LAB — TSH: TSH: 0.763 u[IU]/mL (ref 0.450–4.500)

## 2020-11-17 ENCOUNTER — Encounter: Payer: Self-pay | Admitting: Cardiovascular Disease

## 2020-11-17 ENCOUNTER — Other Ambulatory Visit: Payer: Self-pay

## 2020-11-17 ENCOUNTER — Ambulatory Visit: Payer: Medicare Other | Admitting: Cardiovascular Disease

## 2020-11-17 VITALS — BP 112/84 | HR 75 | Ht 68.0 in | Wt 297.0 lb

## 2020-11-17 DIAGNOSIS — G478 Other sleep disorders: Secondary | ICD-10-CM | POA: Diagnosis not present

## 2020-11-17 DIAGNOSIS — E119 Type 2 diabetes mellitus without complications: Secondary | ICD-10-CM

## 2020-11-17 DIAGNOSIS — I10 Essential (primary) hypertension: Secondary | ICD-10-CM | POA: Diagnosis not present

## 2020-11-17 DIAGNOSIS — E039 Hypothyroidism, unspecified: Secondary | ICD-10-CM

## 2020-11-17 DIAGNOSIS — E785 Hyperlipidemia, unspecified: Secondary | ICD-10-CM

## 2020-11-17 DIAGNOSIS — Z794 Long term (current) use of insulin: Secondary | ICD-10-CM

## 2020-11-17 NOTE — Patient Instructions (Signed)
Medication Instructions:  CONTINUE WITH SAME MEDICATIONS *If you need a refill on your cardiac medications before your next appointment, please call your pharmacy*   Follow-Up: At [facility], you and your health needs are our priority.  As part of our continuing mission to provide you with exceptional heart care, we have created designated Provider Care Teams.  These Care Teams include your primary Cardiologist (physician) and Advanced Practice Providers (APPs -  Physician Assistants and Nurse Practitioners) who all work together to provide you with the care you need, when you need it.  We recommend signing up for the patient portal called "MyChart".  Sign up information is provided on this After Visit Summary.  MyChart is used to connect with patients for Virtual Visits (Telemedicine).  Patients are able to view lab/test results, encounter notes, upcoming appointments, etc.  Non-urgent messages can be sent to your provider as well.   To learn more about what you can do with MyChart, go to [email or web address].    Your next appointment:   12 month(s)  The format for your next appointment:   In Person  Provider:   Shelva Majestic, MD

## 2020-11-17 NOTE — Progress Notes (Signed)
Cardiology Office Note    Date:  11/19/2020   ID:  [name], [name] [birth year], MRN [id number]  PCP:  Fayrene Helper, MD  Cardiologist:  Shelva Majestic, MD   14 month F/U   History of Present Illness:  [name] is a 65 y.o. female who has a history of hypertension, and type 2 diabetes mellitus in addition to GERD.  She had presented to the office on 11/13/2015 and was seen by Rosaria Ferries, PA-C, as well as myself.  At that time, she was under significant amount of stress secondary to family issues, and also had experienced some chest pain radiating to the right side of her neck which was not exertionally precipitated. She has a significant family history for CAD and was morbidly obese.  She subsequently underwent an echo Doppler study  on 12/03/2015 which showed an ejection fraction at 50-55% with grade 1 diastolic dysfunction.  There was mitral annular calcification with trivial MR.  She had normal pulmonary pressures.  A nuclear perfusion study was low risk and showed normal perfusion and function.  Laboratory had revealed elevation of her lipid studies with a total cholesterol 204, triglycerides 158, and LDL 131.  She was started on atorvastatin 40 mg.  She has tolerated this well.     When I saw her, she admitted to very poor sleep.  She had frequent awakenings, her sleep was nonrestorative, she noted daytime fatigue, and she snores.  She denied any exertional precipitation of chest pain.  She had occasional panic attacks and her heart rate increases.  I felt she had symptoms highly suggestive of obstructive sleep apnea and recommended that she undergo a sleep study for further evaluation.  Apparently, her insurance company which is [employer] denied her sleep evaluation.     I saw her in May 2018 at which time she continued to sleep poorly.  She was having occasional panic attacks and had noticed her heart rate increasing.  She continued to be under a lot  of stress.  She continued to have nocturia 3 times per night, her sleep is nonrestorative, she snores.     She ultimately underwent a sleep study on August 11, 2016.  This revealed increased upper airway resistance syndrome  (UARS) with an AHI of 3.6/h, although her RDI was increased at 15.1/h.  She had reduced sleep efficiency at 63.4%.  There was mild oxygen desaturation to a nadir of 87%.  There was abnormal sleep architecture with absence of slow-wave sleep, reduction of REM sleep and prolonged latency to REM sleep development.  She did not meet CPAP criteria at that time.   She was evaluated by me in a telemedicine encounter in January 2021.  Over the previous  several years, she has been followed by Dr. Moshe Cipro who has been checking her laboratory frequently.  She believe her stress level is significantly improved since her husband left and she is now living by herself.  She gained significant weight with Covid but not exercising but she plans now to start walking daily.  Her sleep has significantly improved with her reduction in stress level.  She denies chest pain.  She denies palpitations.  Recent laboratory from December 07, 2018 showed a total cholesterol 169, LDL cholesterol 96 and triglycerides 209 with HDL 43.    I last saw her in September 17, 2019 and since her prior evaluation she continued  to be on benazepril 10 mg, furosemide 20 mg for  hypertension and has a prescription for Cardizem 30 mg which he takes as needed with increased heart rate.  She was on levothyroxine 150 mcg for hypothyroidism.  She is diabetic on Metformin.  She continued to be on rosuvastatin 40 mg for hyperlipidemia and Dexilant for GERD.  She believes her stress has significantly improved since she is now separated from her husband.  She was sleeping well and denies any daytime sleepiness.  An Epworth Sleepiness Scale score was calculated in the office  and endorsed at 3.  During that evaluation, her blood pressure was  stable.  She was sleeping well.  She is continued to be followed by Dr. Tula Nakayama for primary care.  Since I last saw her, she has felt well.  She specifically denies any chest pain.  She continues to be on benazepril 10 mg daily and furosemide 20 mg for hypertension and leg swelling.  She is on levothyroxine 150 mcg for hypothyroidism.  She is diabetic on glipizide in addition to metformin.  She has tolerated rosuvastatin 40 mg for hyperlipidemia.  Laboratory on November 11, 2020 showed an LDL cholesterol of 54 with total cholesterol 128, triglycerides 145 and HDL 49.  Glucose was 131.  Hemoglobin A1c was 7.7.  She presents for evaluation.  Past Medical History:  Diagnosis Date   Allergy    Anemia    Arthritis    Cataract    forming   Diabetes mellitus without complication ([place]) 6269   GERD (gastroesophageal reflux disease)    Hyperlipidemia    Hypertension 2011   Morbid obesity ([place])    Neuromuscular disorder ([place])    issues with nerves in back    Osteopenia    Thyroid disease     Past Surgical History:  Procedure Laterality Date   ABDOMINAL HYSTERECTOMY     APPENDECTOMY     CHOLECYSTECTOMY     COLONOSCOPY  2009   [facility] - normal per pt    TONSILLECTOMY      Current Medications: Outpatient Medications Prior to Visit  Medication Sig Dispense Refill   aspirin 81 MG tablet Take 81 mg by mouth daily.     benazepril (LOTENSIN) 5 MG tablet TAKE TWO (2) TABLETS BY MOUTH EVERY DAY 180 tablet 1   blood glucose meter kit and supplies Dispense based on patient and insurance preference. Once daily testing dx e11.9 1 each 0   calcium-vitamin D (OSCAL WITH D) 500-200 MG-UNIT per tablet Take 1 tablet by mouth daily.     cyclobenzaprine (FLEXERIL) 5 MG tablet TAKE ONE TABLET BY MOUTH AT BEDTIME AS NEEDED. SHOULD LAST SIX MONTHS. 30 tablet 0   Dexlansoprazole 30 MG capsule Take 30 mg by mouth daily.     FLONASE 50 MCG/ACT nasal spray Place 2 sprays into both nostrils daily. 16  g 3   furosemide (LASIX) 20 MG tablet TAKE ONE TABLET BY MOUTH ONCE DAILY FOR LEG SWELLING. 30 tablet 5   gabapentin (NEURONTIN) 300 MG capsule TAKE ONE CAPSULE BY MOUTH TWICE DAILY 180 capsule 3   glucose blood (ONETOUCH VERIO) test strip Use as instructed once daily testing dx e11.9 100 each 5   ibuprofen (ADVIL) 800 MG tablet Take 1 tablet (800 mg total) by mouth every 8 (eight) hours as needed. 21 tablet 0   levothyroxine (SYNTHROID) 150 MCG tablet TAKE ONE TABLET BY MOUTH DAILY. 90 tablet 1   metFORMIN (GLUCOPHAGE) 1000 MG tablet TAKE ONE TABLET BY MOUTH TWICE DAILY WITH A MEAL 180 tablet  1   montelukast (SINGULAIR) 10 MG tablet TAKE ONE TABLET BY MOUTH AT BEDTIME 90 tablet 1   OneTouch Delica Lancets 09F MISC Once daily testing dx e11.9 100 each 5   oxyCODONE-acetaminophen (PERCOCET) 7.5-325 MG tablet Take one tablet once daily , as needed , for uncontrolled back pain 10 tablet 0   potassium chloride SA (KLOR-CON) 20 MEQ tablet TAKE ONE TABLET BY MOUTH EVERY DAY AS NEEDED ON DAYS YOU TAKE FUROSEMIDE FOR FOR SWELLING 30 tablet 5   rosuvastatin (CRESTOR) 40 MG tablet TAKE ONE TABLET BY MOUTH DAILY. 90 tablet 3   traZODone (DESYREL) 100 MG tablet TAKE ONE TABLET BY MOUTH AT BEDTIME 90 tablet 1   glipiZIDE (GLUCOTROL XL) 10 MG 24 hr tablet TAKE ONE TABLET BY MOUTH EVERY DAY WITH BREAKFAST 90 tablet 2   glipiZIDE (GLUCOTROL XL) 5 MG 24 hr tablet TAKE ONE TABLET BY MOUTH DAILY WITH BREAKFAST (TO TAKE IN ADDITION TO 10MG TABLET) 90 tablet 1   Facility-Administered Medications Prior to Visit  Medication Dose Route Frequency Provider Last Rate Last Admin   Influenza (>/= 3 years) inactive virus vaccine (FLVIRIN/FLUZONE) injection SUSP 0.5 mL  0.5 mL Intramuscular Once Fayrene Helper, MD       methylPREDNISolone acetate (DEPO-MEDROL) injection 120 mg  120 mg Intramuscular Once Fayrene Helper, MD         Allergies:   Naproxen sodium, Tsh [thyrotropin], and Statins   Social History    Socioeconomic History   Marital status: Married    Spouse name: Rafael    Number of children: 2   Years of education: 12   Highest education level: 12th grade  Occupational History   Occupation: disabled   Tobacco Use   Smoking status: Never   Smokeless tobacco: Never  Substance and Sexual Activity   Alcohol use: No   Drug use: No   Sexual activity: Not Currently  Other Topics Concern   Not on file  Social History Narrative   Lives with Husband alone    Social Determinants of Health   Financial Resource Strain: Low Risk    Difficulty of Paying Living Expenses: Not hard at all  Food Insecurity: No Food Insecurity   Worried About Charity fundraiser in the Last Year: Never true   Ran Out of Food in the Last Year: Never true  Transportation Needs: No Transportation Needs   Lack of Transportation (Medical): No   Lack of Transportation (Non-Medical): No  Physical Activity: Insufficiently Active   Days of Exercise per Week: 4 days   Minutes of Exercise per Session: 30 min  Stress: No Stress Concern Present   Feeling of Stress : Not at all  Social Connections: Moderately Isolated   Frequency of Communication with Friends and Family: More than three times a week   Frequency of Social Gatherings with Friends and Family: Not on file   Attends Religious Services: More than 4 times per year   Active Member of [employer] or Organizations: No   Attends Archivist Meetings: Never   Marital Status: Separated     Family History:  The patient's family history includes Asthma in her mother; Cancer in her maternal grandmother, paternal grandfather, sister, and sister; Diabetes in her father; Heart disease (age of onset: 79) in her mother; Heart disease (age of onset: 58) in her father; Hypertension in her father and mother; Prostate cancer in her paternal grandmother.   ROS General: Negative; No fevers, chills, or night sweats;  morbid obesity HEENT: Negative; No changes in vision  or hearing, sinus congestion, difficulty swallowing Pulmonary: Negative; No cough, wheezing, shortness of breath, hemoptysis Cardiovascular: Negative; No chest pain, presyncope, syncope, palpitations GI: Negative; No nausea, vomiting, diarrhea, or abdominal pain GU: Negative; No dysuria, hematuria, or difficulty voiding Musculoskeletal: Negative; no myalgias, joint pain, or weakness Hematologic/Oncology: Negative; no easy bruising, bleeding Endocrine: Positive for diabetes mellitus Neuro: Negative; no changes in balance, headaches Skin: Negative; No rashes or skin lesions Psychiatric: Negative; No behavioral problems, depression Sleep: Mild snoring, no daytime sleepiness, hypersomnolence, bruxism, restless legs, hypnogognic hallucinations, no cataplexy Other comprehensive 14 point system review is negative.   PHYSICAL EXAM:   VS:  BP 112/84 (BP Location: Left Arm, Patient Position: Sitting, Cuff Size: Large)   Pulse 75   Ht _0  (1.727 m)   Wt 297 lb (134.7 kg)   SpO2 99%   BMI 45.16 kg/m     Repeat blood pressure by me was 120/82  Wt Readings from Last 3 Encounters:  11/18/20 297 lb (134.7 kg)  11/17/20 297 lb (134.7 kg)  08/07/20 300 lb (136.1 kg)    General: Alert, oriented, no distress.  Skin: normal turgor, no rashes, warm and dry HEENT: Normocephalic, atraumatic. Pupils equal round and reactive to light; sclera anicteric; extraocular muscles intact;  Nose without nasal septal hypertrophy Mouth/Parynx benign; Mallinpatti scale 3 Neck: No JVD, no carotid bruits; normal carotid upstroke Lungs: clear to ausculatation and percussion; no wheezing or rales Chest wall: without tenderness to palpitation Heart: PMI not displaced, RRR, s1 s2 normal, 1/6 systolic murmur, no diastolic murmur, no rubs, gallops, thrills, or heaves Abdomen: soft, nontender; no hepatosplenomehaly, BS+; abdominal aorta nontender and not dilated by palpation. Back: no CVA tenderness Pulses  2+ Musculoskeletal: full range of motion, normal strength, no joint deformities Extremities: no clubbing cyanosis or edema, Homan's sign negative  Neurologic: grossly nonfocal; Cranial nerves grossly wnl Psychologic: Normal mood and affect   Studies/Labs Reviewed:   November 17, 2020 ECG (independently read by me): NSR at 75; no ectopy, normal intervals  September 17, 2019 ECG (independently read by me): NSR at 94; no ectopy; normal intervls  Jun 10, 2016 ECG (independently reviewed by me): Normal sinus rhythm at 95 bpm, normal intervals without ST segment changes.  Recent Labs: BMP Latest Ref Rng & Units 11/11/2020 05/02/2020 12/25/2019  Glucose 70 - 99 mg/dL 131(H) 162(H) 128(H)  BUN 8 - 27 mg/dL _1 Creatinine 0.57 - 1.00 mg/dL 0.90 1.03(H) 0.85  BUN/Creat Ratio 12 - _2 Sodium 134 - 144 mmol/L 142 138 140  Potassium 3.5 - 5.2 mmol/L 5.0 4.7 4.4  Chloride 96 - 106 mmol/L 102 98 103  CO2 20 - 29 mmol/L _3 Calcium 8.7 - 10.3 mg/dL 10.0 10.0 9.6     Hepatic Function Latest Ref Rng & Units 11/11/2020 05/02/2020 08/08/2019  Total Protein 6.0 - 8.5 g/dL 6.6 7.1 7.0  Albumin 3.8 - 4.8 g/dL 4.1 4.3 -  AST 0 - 40 IU/L _4 ALT 0 - 32 IU/L _5 Alk Phosphatase 44 - 121 IU/L 82 99 -  Total Bilirubin 0.0 - 1.2 mg/dL 0.5 0.5 0.3  Bilirubin, Direct 0.0 - 0.2 mg/dL - - 0.1    CBC Latest Ref Rng & Units 11/11/2020 12/07/2018 04/07/2018  WBC 3.4 - 10.8 x10E3/uL 9.0 9.5 6.9  Hemoglobin 11.1 - 15.9 g/dL 13.5 13.7 12.5  Hematocrit 34.0 -  46.6 % 40.4 41.3 37.5  Platelets 150 - 450 x10E3/uL 336 403(H) 411(H)   Lab Results  Component Value Date   MCV 90 11/11/2020   MCV 93.0 12/07/2018   MCV 90.8 04/07/2018   Lab Results  Component Value Date   TSH 0.763 11/11/2020   Lab Results  Component Value Date   HGBA1C 7.7 (H) 11/11/2020     BNP No results found for: BNP  ProBNP No results found for: PROBNP   Lipid Panel     Component Value Date/Time    CHOL 128 11/11/2020 0922   TRIG 145 11/11/2020 0922   HDL 49 11/11/2020 0922   CHOLHDL 2.6 11/11/2020 0922   CHOLHDL 2.6 08/08/2019 1100   VLDL 22 03/19/2016 0933   LDLCALC 54 11/11/2020 0922   LDLCALC 66 08/08/2019 1100   LABVLDL 25 11/11/2020 0922     RADIOLOGY: No results found.   Additional studies/ records that were reviewed today include:    NUCLEAR STUDY: 11/28/2015 Study Highlights   The left ventricular ejection fraction is normal (55-65%). Nuclear stress EF: 55%. There was no ST segment deviation noted during stress. The study is normal. This is a low risk study.      ------------------------------------------------------------------- ECHO Study Conclusions: 12/12/2015   - Left ventricle: The cavity size was normal. Wall thickness was   normal. Systolic function was low normal to mildly reduced. The   estimated ejection fraction was in the range of 50% to 55%. Wall   motion was normal; there were no regional wall motion   abnormalities. Doppler parameters are consistent with abnormal   left ventricular relaxation (grade 1 diastolic dysfunction). - Aortic valve: There was no stenosis. - Mitral valve: Mildly calcified annulus. There was trivial   regurgitation. - Right ventricle: The cavity size was normal. Systolic function   was normal. - Tricuspid valve: Peak RV-RA gradient (S): 16 mm Hg. - Pulmonary arteries: PA peak pressure: 19 mm Hg (S). - Inferior vena cava: The vessel was normal in size. The   respirophasic diameter changes were in the normal range (>= 50%),   consistent with normal central venous pressure.   Impressions:   - Normal LV size with EF 50-55%, low normal to mildly reduced   systolic function. Normal RV size and systolic function. No   significant valvular abnormalities.     SLEEP STUDY  08/11/2016  IMPRESSIONS Increased upper airway resistance syndrome (UARS) , with an AHI of 3.6 per hour and increased RDI at 15.1/h. No significant  central sleep apnea occurred during this study (CAI = 0.0/h). Mild oxygen desaturation to a nadir of 87%. Reduced sleep efficiency at only 63.4%. Abnormal sleep architecture with absence of slow-wave sleep, reduction in in REM sleep, and prolonged latency to in REM sleep The patient snored with Moderate snoring volume. No significant cardiac abnormalities were noted during this study. Moderate periodic limb movements of sleep occurred during the study. No significant associated arousals.   DIAGNOSIS Sleep apnea, unspecified type, G47.30 Periodic limb movement of sleep   RECOMMENDATIONS At present, patient does not meet criteria for CPAP therapy. Efforts should be made to optimize nasal and oral pharyngeal patency. Consider alternatives for the treatment of moderate snoring. If patient is symptomatic with restless legs, consider pharmacotherapy with a PLMS index of 34.11 Avoid alcohol, sedatives and other CNS depressants that may worsen sleep apnea and disrupt normal sleep architecture. Sleep hygiene should be reviewed to assess factors that may improve sleep quality. Weight management and  regular exercise should be initiated or continued if appropriate.   [Electronically signed] 09/16/2016 10:50 AM    ASSESSMENT:    1. Essential hypertension   2. Hyperlipidemia with target LDL less than 70   3. Hypothyroidism, unspecified type   4. UARS (upper airway resistance syndrome)   5. Morbid obesity ([place])   6. Type 2 diabetes mellitus without complication, with long-term current use of insulin [facility])    PLAN:  Ms. Nydia Ytuarte is a 65 year old female who has a history of morbid obesity with a current BMI 45.16,  hypertension, type 2 diabetes mellitus, GERD, and snoring.  Presently, her blood pressure is stable on her regimen of benazepril 10 mg and furosemide 20 mg daily.  She is not having any leg edema on current therapy.  She continues to sleep well and a prior sleep study suggested  increased upper airway resistance syndrome without definitive sleep apnea.  Presently she denies any residual daytime sleepiness and her sleep is restorative.  She is diabetic on glipizide in addition to metformin 5 by Dr. Moshe Cipro.  Recent hemoglobin A1c was 7.7 improved from 8.4 in April 2022.  She is tolerating rosuvastatin 40 mg.  Lipid studies were excellent and she is at target with LDL cholesterol at 54.  She has a neuropathy for which she takes gabapentin.  We discussed the importance of weight loss and exercise.  Clinically she is stable.  I will see her in 1 year for reevaluation or sooner as needed.   Her blood pressure today is stable on her current regimen consisting of benazepril 10 mg, furosemide 20 mg daily.  She also has a prescription for diltiazem 30 mg which she had taken in the past on a as needed basis during periods of increased heart rate and panic attacks.  She previously underwent a sleep study which revealed increased upper airway resistance syndrome without definitive sleep apnea.  An Epworth Sleepiness Scale score was calculated in the office today and this endorsed at 3 arguing against any daytime sleepiness.  She believes she is sleeping better.  Her stress load has significantly improved since she is now separated from her husband.  She continues to be on rosuvastatin 40 mg for hyperlipidemia LDL cholesterol on August 08, 2019 was excellent at 28.  She is diabetic on glipizide in addition to Metformin.  Her GERD is controlled with Dexilant.  She continues to be on levothyroxine 150 mcg for hypothyroidism.  TSH level was 0.69 on August 08, 2019.  She is followed by Dr. Tula Nakayama for her primary care.  Cardiac wise she is stable.  I will see her in 1 year for reevaluation or sooner as needed   Medication Adjustments/Labs and Tests Ordered: Current medicines are reviewed at length with the patient today.  Concerns regarding medicines are outlined above.  Medication changes, Labs  and Tests ordered today are listed in the Patient Instructions below. Patient Instructions  Medication Instructions:  CONTINUE WITH SAME MEDICATIONS *If you need a refill on your cardiac medications before your next appointment, please call your pharmacy*   Follow-Up: At [facility], you and your health needs are our priority.  As part of our continuing mission to provide you with exceptional heart care, we have created designated Provider Care Teams.  These Care Teams include your primary Cardiologist (physician) and Advanced Practice Providers (APPs -  Physician Assistants and Nurse Practitioners) who all work together to provide you with the care you need, when you need it.  We recommend signing up for the patient portal called "MyChart".  Sign up information is provided on this After Visit Summary.  MyChart is used to connect with patients for Virtual Visits (Telemedicine).  Patients are able to view lab/test results, encounter notes, upcoming appointments, etc.  Non-urgent messages can be sent to your provider as well.   To learn more about what you can do with MyChart, go to [email or web address].    Your next appointment:   12 month(s)  The format for your next appointment:   In Person  Provider:   Shelva Majestic, MD     Signed, Shelva Majestic, MD  11/19/2020 6:41 PM    [place] [address], [place], [place], [place]  [address] Phone: [phone]

## 2020-11-18 ENCOUNTER — Encounter: Payer: Self-pay | Admitting: Family Medicine

## 2020-11-18 ENCOUNTER — Ambulatory Visit (INDEPENDENT_AMBULATORY_CARE_PROVIDER_SITE_OTHER): Payer: Medicare Other | Admitting: Family Medicine

## 2020-11-18 ENCOUNTER — Telehealth: Payer: Self-pay

## 2020-11-18 ENCOUNTER — Other Ambulatory Visit: Payer: Self-pay

## 2020-11-18 VITALS — BP 130/78 | HR 88 | Resp 18 | Ht 68.0 in | Wt 297.0 lb

## 2020-11-18 DIAGNOSIS — E785 Hyperlipidemia, unspecified: Secondary | ICD-10-CM | POA: Diagnosis not present

## 2020-11-18 DIAGNOSIS — Z23 Encounter for immunization: Secondary | ICD-10-CM

## 2020-11-18 DIAGNOSIS — E1165 Type 2 diabetes mellitus with hyperglycemia: Secondary | ICD-10-CM | POA: Diagnosis not present

## 2020-11-18 DIAGNOSIS — E039 Hypothyroidism, unspecified: Secondary | ICD-10-CM

## 2020-11-18 DIAGNOSIS — Z780 Asymptomatic menopausal state: Secondary | ICD-10-CM | POA: Diagnosis not present

## 2020-11-18 DIAGNOSIS — I10 Essential (primary) hypertension: Secondary | ICD-10-CM

## 2020-11-18 MED ORDER — GLIPIZIDE ER 10 MG PO TB24: ORAL_TABLET | ORAL | 3 refills | Status: DC

## 2020-11-18 MED ORDER — TIRZEPATIDE 2.5 MG/0.5ML ~~LOC~~ SOAJ: 2.5000 mg | SUBCUTANEOUS | 1 refills | Status: DC

## 2020-11-18 NOTE — Assessment & Plan Note (Signed)
Controlled, no change in medication  

## 2020-11-18 NOTE — Assessment & Plan Note (Signed)
Improved but still not at goal [name] is reminded of the importance of commitment to daily physical activity for 30 minutes or more, as able and the need to limit carbohydrate intake to 30 to 60 grams per meal to help with blood sugar control.   The need to take medication as prescribed, test blood sugar as directed, and to call between visits if there is a concern that blood sugar is uncontrolled is also discussed.   [name] is reminded of the importance of daily foot exam, annual eye examination, and good blood sugar, blood pressure and cholesterol control.  Diabetic Labs Latest Ref Rng & Units 11/11/2020 08/07/2020 05/02/2020 12/25/2019 09/25/2019  HbA1c 4.8 - 5.6 % 7.7(H) - 8.4(H) 7.1(H) 7.6(A)  Microalbumin mg/dL - - - - -  Micro/Creat Ratio 0 - 29 mg/g creat - <6 - - -  Chol 100 - 199 mg/dL 128 - 142 132 -  HDL >39 mg/dL 49 - 52 50 -  Calc LDL 0 - 99 mg/dL 54 - 64 57 -  Triglycerides 0 - 149 mg/dL 145 - 153(H) 145 -  Creatinine 0.57 - 1.00 mg/dL 0.90 - 1.03(H) 0.85 -   BP/Weight 11/18/2020 11/17/2020 08/07/2020 05/07/2020 03/21/2020 12/26/2019 02/63/7858  Systolic BP 850 277 412 878 676 720 947  Diastolic BP 78 84 74 77 78 65 70  Wt. (Lbs) 297 297 300 303 301 295 284  BMI 45.16 45.16 45.61 46.07 45.77 44.85 43.18   Foot/eye exam completion dates Latest Ref Rng & Units 12/26/2019 09/28/2019  Eye Exam No Retinopathy - No Retinopathy  Foot Form Completion - Done -

## 2020-11-18 NOTE — Assessment & Plan Note (Signed)
  Patient re-educated about  the importance of commitment to a  minimum of 150 minutes of exercise per week as able.  The importance of healthy food choices with portion control discussed, as well as eating regularly and within a 12 hour window most days. The need to choose "clean , green" food 50 to 75% of the time is discussed, as well as to make water the primary drink and set a goal of 64 ounces water daily.    Weight /BMI 11/18/2020 11/17/2020 08/07/2020  WEIGHT 297 lb 297 lb 300 lb  HEIGHT 5\' 8"  5\' 8"  5\' 8"   BMI 45.16 kg/m2 45.16 kg/m2 45.61 kg/m2

## 2020-11-18 NOTE — Patient Instructions (Addendum)
Annual exam week of Dec 12,  re eval blood sugar , call if you need me sooner  Congrats on improved labs  Pneumonia 20 and flu vacccine today  Please schedule dexa at checkout ([facility])  Stop glipizide 5 mg tablet, changed after visit due to cost, increased to glipizide 20 mg daily and no mounjaro, not covered  Continue glipizide 10 mg one daily and metformin 1000 mg twice daily New is once weekly Mounjaro 2.5 mg   Goal for FBG is 80 to 130  Please give coupon for [facility] at checkout  Nurse please send for andf document eye exam done in 10/2020, Dr Gershon Crane  It is important that you exercise regularly at least 30 minutes 5 times a week. If you develop chest pain, have severe difficulty breathing, or feel very tired, stop exercising immediately and seek medical attention   Think about what you will eat, plan ahead. Choose " clean, green, fresh or frozen" over canned, processed or packaged foods which are more sugary, salty and fatty. 70 to 75% of food eaten should be vegetables and fruit. Three meals at set times with snacks allowed between meals, but they must be fruit or vegetables. Aim to eat over a 12 hour period , example 7 am to 7 pm, and STOP after  your last meal of the day. Drink water,generally about 64 ounces per day, no other drink is as healthy. Fruit juice is best enjoyed in a healthy way, by EATING the fruit. Thanks for choosing [facility], we consider it a privelige to serve you.

## 2020-11-18 NOTE — Assessment & Plan Note (Signed)
Hyperlipidemia:Low fat diet discussed and encouraged.   Lipid Panel  Lab Results  Component Value Date   CHOL 128 11/11/2020   HDL 49 11/11/2020   LDLCALC 54 11/11/2020   TRIG 145 11/11/2020   CHOLHDL 2.6 11/11/2020   Controlled, no change in medication

## 2020-11-18 NOTE — Progress Notes (Signed)
[name]     MRN: [id number]      DOB: [birth year]   HPI [name] is here for follow up and re-evaluation of chronic medical conditions, medication management and review of any available recent lab and radiology data.  Preventive health is updated, specifically  Cancer screening and Immunization.   Has upcoming procedure through pain management to" burn nerves' for pain relief The PT denies any adverse reactions to current medications since the last visit.  Denies polyuria, polydipsia, blurred vision , or hypoglycemic episodes. Has been working on blood usgar which has improved  ROS Denies recent fever or chills. Denies sinus pressure, nasal congestion, ear pain or sore throat. Denies chest congestion, productive cough or wheezing. Denies chest pains, palpitations and leg swelling Denies abdominal pain, nausea, vomiting,diarrhea or constipation.   Denies dysuria, frequency, hesitancy or incontinence. Chronic  joint pain,  and limitation in mobility. Denies headaches, seizures, numbness, or tingling. Denies depression, anxiety or insomnia. Denies skin break down or rash.   PE  BP 130/78   Pulse 88   Resp 18   Ht 5\' 8"  (1.727 m)   Wt 297 lb (134.7 kg)   SpO2 94%   BMI 45.16 kg/m    Patient alert and oriented and in no cardiopulmonary distress.  HEENT: No facial asymmetry, EOMI,     Neck supple .  Chest: Clear to auscultation bilaterally.  CVS: S1, S2 no murmurs, no S3.Regular rate.  ABD: Soft non tender.   Ext: No edema  MS: decreased  ROM spine, shoulders, hips and knees.  Skin: Intact, no ulcerations or rash noted.  Psych: Good eye contact, normal affect. Memory intact not anxious or depressed appearing.  CNS: CN 2-12 intact, power,  normal throughout.no focal deficits noted.   Assessment & Plan  Hypothyroidism Controlled, no change in medication   Hyperlipidemia Hyperlipidemia:Low fat diet discussed and encouraged.   Lipid Panel  Lab  Results  Component Value Date   CHOL 128 11/11/2020   HDL 49 11/11/2020   LDLCALC 54 11/11/2020   TRIG 145 11/11/2020   CHOLHDL 2.6 11/11/2020   Controlled, no change in medication     Morbid obesity  Patient re-educated about  the importance of commitment to a  minimum of 150 minutes of exercise per week as able.  The importance of healthy food choices with portion control discussed, as well as eating regularly and within a 12 hour window most days. The need to choose "clean , green" food 50 to 75% of the time is discussed, as well as to make water the primary drink and set a goal of 64 ounces water daily.    Weight /BMI 11/18/2020 11/17/2020 08/07/2020  WEIGHT 297 lb 297 lb 300 lb  HEIGHT 5\' 8"  5\' 8"  5\' 8"   BMI 45.16 kg/m2 45.16 kg/m2 45.61 kg/m2      Hypertension goal BP (blood pressure) < 130/80 Controlled, no change in medication DASH diet and commitment to daily physical activity for a minimum of 30 minutes discussed and encouraged, as a part of hypertension management. The importance of attaining a healthy weight is also discussed.  BP/Weight 11/18/2020 11/17/2020 08/07/2020 05/07/2020 03/21/2020 12/26/2019 03/70/4888  Systolic BP 916 945 038 882 800 349 179  Diastolic BP 78 84 74 77 78 65 70  Wt. (Lbs) 297 297 300 303 301 295 284  BMI 45.16 45.16 45.61 46.07 45.77 44.85 43.18       Type 2 diabetes mellitus with hyperglycemia (HCC) Improved  but still not at goal [name] is reminded of the importance of commitment to daily physical activity for 30 minutes or more, as able and the need to limit carbohydrate intake to 30 to 60 grams per meal to help with blood sugar control.   The need to take medication as prescribed, test blood sugar as directed, and to call between visits if there is a concern that blood sugar is uncontrolled is also discussed.   [name] is reminded of the importance of daily foot exam, annual eye examination, and good blood sugar, blood  pressure and cholesterol control.  Diabetic Labs Latest Ref Rng & Units 11/11/2020 08/07/2020 05/02/2020 12/25/2019 09/25/2019  HbA1c 4.8 - 5.6 % 7.7(H) - 8.4(H) 7.1(H) 7.6(A)  Microalbumin mg/dL - - - - -  Micro/Creat Ratio 0 - 29 mg/g creat - <6 - - -  Chol 100 - 199 mg/dL 128 - 142 132 -  HDL >39 mg/dL 49 - 52 50 -  Calc LDL 0 - 99 mg/dL 54 - 64 57 -  Triglycerides 0 - 149 mg/dL 145 - 153(H) 145 -  Creatinine 0.57 - 1.00 mg/dL 0.90 - 1.03(H) 0.85 -   BP/Weight 11/18/2020 11/17/2020 08/07/2020 05/07/2020 03/21/2020 12/26/2019 28/63/8177  Systolic BP 116 579 038 333 832 919 166  Diastolic BP 78 84 74 77 78 65 70  Wt. (Lbs) 297 297 300 303 301 295 284  BMI 45.16 45.16 45.61 46.07 45.77 44.85 43.18   Foot/eye exam completion dates Latest Ref Rng & Units 12/26/2019 09/28/2019  Eye Exam No Retinopathy - No Retinopathy  Foot Form Completion - Done -

## 2020-11-18 NOTE — Assessment & Plan Note (Signed)
Controlled, no change in medication DASH diet and commitment to daily physical activity for a minimum of 30 minutes discussed and encouraged, as a part of hypertension management. The importance of attaining a healthy weight is also discussed.  BP/Weight 11/18/2020 11/17/2020 08/07/2020 05/07/2020 03/21/2020 12/26/2019 50/35/4656  Systolic BP 812 751 700 174 944 967 591  Diastolic BP 78 84 74 77 78 65 70  Wt. (Lbs) 297 297 300 303 301 295 284  BMI 45.16 45.16 45.61 46.07 45.77 44.85 43.18

## 2020-11-19 ENCOUNTER — Encounter: Payer: Self-pay | Admitting: Cardiovascular Disease

## 2020-12-02 ENCOUNTER — Encounter: Payer: Self-pay | Admitting: Internal Medicine

## 2020-12-02 ENCOUNTER — Other Ambulatory Visit: Payer: Self-pay

## 2020-12-02 ENCOUNTER — Ambulatory Visit (INDEPENDENT_AMBULATORY_CARE_PROVIDER_SITE_OTHER): Payer: Medicare Other | Admitting: Internal Medicine

## 2020-12-02 DIAGNOSIS — Z20822 Contact with and (suspected) exposure to covid-19: Secondary | ICD-10-CM

## 2020-12-02 DIAGNOSIS — J069 Acute upper respiratory infection, unspecified: Secondary | ICD-10-CM

## 2020-12-02 NOTE — Patient Instructions (Signed)
Please check home COVID test and let us know if it is positive.  Please come to clinic for rapid flu test.  Okay to take DayQuil/NyQuil for symptomatic relief.

## 2020-12-02 NOTE — Progress Notes (Signed)
Virtual Visit via Telephone Note   This visit type was conducted due to national recommendations for restrictions regarding the COVID-19 Pandemic (e.g. social distancing) in an effort to limit this patient's exposure and mitigate transmission in our community.  Due to her co-morbid illnesses, this patient is at least at moderate risk for complications without adequate follow up.  This format is felt to be most appropriate for this patient at this time.  The patient did not have access to video technology/had technical difficulties with video requiring transitioning to audio format only (telephone).  All issues noted in this document were discussed and addressed.  No physical exam could be performed with this format.  Evaluation Performed:  Follow-up visit  Date:  12/02/2020   ID:  Lisa, Underwood 1955-06-14, MRN [id number]  Patient Location: Home Provider Location: Office/Clinic  Participants: Patient Location of Patient: Home Location of Provider: Telehealth Consent was obtain for visit to be over via telehealth. I verified that I am speaking with the correct person using two identifiers.  PCP:  Fayrene Helper, MD   Chief Complaint: Fever, myalgias and cough  History of Present Illness:    [name] is a 65 y.o. female who has a televisit for complaint of fever, myalgias and cough with nasal congestion and runny nose.  Her fever and myalgias started today, but she had mild runny nose last week.  Her son and daughter had been sick in the last week.  She also visited her great granddaughter last week, who was also sick.  She does not know if any of the family members tested positive for flu or COVID.  The patient does have symptoms concerning for COVID-19 infection (fever, chills, cough, or new shortness of breath).   Past Medical, Surgical, Social History, Allergies, and Medications have been Reviewed.  Past Medical History:  Diagnosis Date   Allergy    Anemia     Arthritis    Cataract    forming   Diabetes mellitus without complication ([place]) 5732   GERD (gastroesophageal reflux disease)    Hyperlipidemia    Hypertension 2011   Morbid obesity ([place])    Neuromuscular disorder ([place])    issues with nerves in back    Osteopenia    Thyroid disease    Past Surgical History:  Procedure Laterality Date   ABDOMINAL HYSTERECTOMY     APPENDECTOMY     CHOLECYSTECTOMY     COLONOSCOPY  2009   [facility] - normal per pt    TONSILLECTOMY       Current Meds  Medication Sig   aspirin 81 MG tablet Take 81 mg by mouth daily.   benazepril (LOTENSIN) 5 MG tablet TAKE TWO (2) TABLETS BY MOUTH EVERY DAY   blood glucose meter kit and supplies Dispense based on patient and insurance preference. Once daily testing dx e11.9   calcium-vitamin D (OSCAL WITH D) 500-200 MG-UNIT per tablet Take 1 tablet by mouth daily.   cyclobenzaprine (FLEXERIL) 5 MG tablet TAKE ONE TABLET BY MOUTH AT BEDTIME AS NEEDED. SHOULD LAST SIX MONTHS.   Dexlansoprazole 30 MG capsule Take 30 mg by mouth daily.   FLONASE 50 MCG/ACT nasal spray Place 2 sprays into both nostrils daily.   furosemide (LASIX) 20 MG tablet TAKE ONE TABLET BY MOUTH ONCE DAILY FOR LEG SWELLING.   gabapentin (NEURONTIN) 300 MG capsule TAKE ONE CAPSULE BY MOUTH TWICE DAILY   glipiZIDE (GLIPIZIDE XL) 10 MG 24 hr tablet  Take two tablets once daily every morning with breakfast   glucose blood (ONETOUCH VERIO) test strip Use as instructed once daily testing dx e11.9   ibuprofen (ADVIL) 800 MG tablet Take 1 tablet (800 mg total) by mouth every 8 (eight) hours as needed.   levothyroxine (SYNTHROID) 150 MCG tablet TAKE ONE TABLET BY MOUTH DAILY.   metFORMIN (GLUCOPHAGE) 1000 MG tablet TAKE ONE TABLET BY MOUTH TWICE DAILY WITH A MEAL   montelukast (SINGULAIR) 10 MG tablet TAKE ONE TABLET BY MOUTH AT BEDTIME   OneTouch Delica Lancets 61Y MISC Once daily testing dx e11.9   oxyCODONE-acetaminophen (PERCOCET) 7.5-325 MG tablet  Take one tablet once daily , as needed , for uncontrolled back pain   potassium chloride SA (KLOR-CON) 20 MEQ tablet TAKE ONE TABLET BY MOUTH EVERY DAY AS NEEDED ON DAYS YOU TAKE FUROSEMIDE FOR FOR SWELLING   rosuvastatin (CRESTOR) 40 MG tablet TAKE ONE TABLET BY MOUTH DAILY.   traZODone (DESYREL) 100 MG tablet TAKE ONE TABLET BY MOUTH AT BEDTIME   Current Facility-Administered Medications for the 12/02/20 encounter (Office Visit) with Lindell Spar, MD  Medication   Influenza (>/= 3 years) inactive virus vaccine (FLVIRIN/FLUZONE) injection SUSP 0.5 mL   methylPREDNISolone acetate (DEPO-MEDROL) injection 120 mg     Allergies:   Naproxen sodium, Tsh [thyrotropin], and Statins   ROS:   Please see the history of present illness.     All other systems reviewed and are negative.   Labs/Other Tests and Data Reviewed:    Recent Labs: 11/11/2020: ALT 17; BUN 12; Creatinine, Ser 0.90; Hemoglobin 13.5; Platelets 336; Potassium 5.0; Sodium 142; TSH 0.763   Recent Lipid Panel Lab Results  Component Value Date/Time   CHOL 128 11/11/2020 09:22 AM   TRIG 145 11/11/2020 09:22 AM   HDL 49 11/11/2020 09:22 AM   CHOLHDL 2.6 11/11/2020 09:22 AM   CHOLHDL 2.6 08/08/2019 11:00 AM   LDLCALC 54 11/11/2020 09:22 AM   LDLCALC 66 08/08/2019 11:00 AM    Wt Readings from Last 3 Encounters:  11/18/20 297 lb (134.7 kg)  11/17/20 297 lb (134.7 kg)  08/07/20 300 lb (136.1 kg)     ASSESSMENT & PLAN:    URTI Suspected COVID-19 infection Will check for rapid flu test Advised to take home COVID test She has NyQuil, can take it as needed Tylenol as needed for fever or myalgias Will start antiviral/antibiotic based on testing results.  Time:   Today, I have spent 9 minutes reviewing the chart, including problem list, medications, and with the patient with telehealth technology discussing the above problems.   Medication Adjustments/Labs and Tests Ordered: Current medicines are reviewed at length  with the patient today.  Concerns regarding medicines are outlined above.   Tests Ordered: No orders of the defined types were placed in this encounter.   Medication Changes: No orders of the defined types were placed in this encounter.    Note: This dictation was prepared with Dragon dictation along with smaller phrase technology. Similar sounding words can be transcribed inadequately or may not be corrected upon review. Any transcriptional errors that result from this process are unintentional.      Disposition:  Follow up  Signed, Lindell Spar, MD  12/02/2020 2:40 PM     [facility]

## 2020-12-03 ENCOUNTER — Ambulatory Visit (INDEPENDENT_AMBULATORY_CARE_PROVIDER_SITE_OTHER): Payer: Medicare Other

## 2020-12-03 ENCOUNTER — Telehealth: Payer: Self-pay | Admitting: Family Medicine

## 2020-12-03 DIAGNOSIS — R6889 Other general symptoms and signs: Secondary | ICD-10-CM

## 2020-12-03 DIAGNOSIS — R059 Cough, unspecified: Secondary | ICD-10-CM

## 2020-12-03 LAB — POCT INFLUENZA A/B
Influenza A, POC: NEGATIVE
Influenza B, POC: NEGATIVE

## 2020-12-03 MED ORDER — AZITHROMYCIN 250 MG PO TABS: ORAL_TABLET | ORAL | 0 refills | Status: AC

## 2020-12-03 NOTE — Telephone Encounter (Signed)
Pt aware.

## 2020-12-03 NOTE — Telephone Encounter (Signed)
Patients flu was negative. Is supposed to sit with an 65 y.o lady Saturday and wants to know if she should be around her by that time if she is still feeling this bad. Please advise

## 2020-12-03 NOTE — Addendum Note (Signed)
Addended byIhor Dow on: 12/03/2020 12:27 PM   Modules accepted: Orders

## 2020-12-03 NOTE — Telephone Encounter (Signed)
Pt called in for Flu results

## 2020-12-04 ENCOUNTER — Telehealth: Payer: Medicare Other | Admitting: Family Medicine

## 2020-12-10 ENCOUNTER — Other Ambulatory Visit: Payer: Self-pay

## 2020-12-10 ENCOUNTER — Ambulatory Visit (HOSPITAL_COMMUNITY)
Admission: RE | Admit: 2020-12-10 | Discharge: 2020-12-10 | Disposition: A | Discharge status: Home / Self Care | Payer: Medicare Other | Source: Ambulatory Visit | Attending: Family Medicine | Admitting: Family Medicine

## 2020-12-10 DIAGNOSIS — Z780 Asymptomatic menopausal state: Secondary | ICD-10-CM | POA: Insufficient documentation

## 2020-12-11 ENCOUNTER — Ambulatory Visit (HOSPITAL_COMMUNITY)
Admission: RE | Admit: 2020-12-11 | Discharge: 2020-12-11 | Disposition: A | Discharge status: Home / Self Care | Payer: Medicare Other | Source: Ambulatory Visit | Attending: Family Medicine | Admitting: Family Medicine

## 2020-12-11 ENCOUNTER — Other Ambulatory Visit: Payer: Self-pay | Admitting: Family Medicine

## 2020-12-11 DIAGNOSIS — M8589 Other specified disorders of bone density and structure, multiple sites: Secondary | ICD-10-CM | POA: Diagnosis not present

## 2020-12-11 DIAGNOSIS — Z780 Asymptomatic menopausal state: Secondary | ICD-10-CM | POA: Insufficient documentation

## 2021-01-01 ENCOUNTER — Other Ambulatory Visit: Payer: Self-pay | Admitting: Family Medicine

## 2021-01-02 ENCOUNTER — Other Ambulatory Visit: Payer: Self-pay

## 2021-01-02 MED ORDER — ROSUVASTATIN CALCIUM 40 MG PO TABS: 40.0000 mg | ORAL_TABLET | Freq: Every day | ORAL | 3 refills | Status: DC

## 2021-01-09 ENCOUNTER — Other Ambulatory Visit: Payer: Self-pay

## 2021-01-09 ENCOUNTER — Ambulatory Visit (INDEPENDENT_AMBULATORY_CARE_PROVIDER_SITE_OTHER): Payer: Medicare Other | Admitting: Family Medicine

## 2021-01-09 ENCOUNTER — Encounter: Payer: Self-pay | Admitting: Family Medicine

## 2021-01-09 VITALS — BP 107/75 | HR 84 | Resp 17 | Ht 68.0 in | Wt 299.0 lb

## 2021-01-09 DIAGNOSIS — Z0001 Encounter for general adult medical examination with abnormal findings: Secondary | ICD-10-CM | POA: Diagnosis not present

## 2021-01-09 DIAGNOSIS — E1165 Type 2 diabetes mellitus with hyperglycemia: Secondary | ICD-10-CM

## 2021-01-09 DIAGNOSIS — Z0000 Encounter for general adult medical examination without abnormal findings: Secondary | ICD-10-CM

## 2021-01-09 NOTE — Progress Notes (Signed)
° ° °  [name]     MRN: [id number]      DOB: Jan 12, 1956  HPI: Patient is in for annual physical exam. No other health concerns are expressed or addressed at the visit. Recent labs,  are reviewed. Immunization is reviewed , and  updated if needed.   PE: BP 107/75    Pulse 84    Resp 17    Ht 5\' 8"  (1.727 m)    Wt 299 lb 0.6 oz (135.6 kg)    SpO2 92%    BMI 45.47 kg/m   Pleasant  female, alert and oriented x 3, in no cardio-pulmonary distress. Afebrile. HEENT No facial trauma or asymetry. Sinuses non tender.  Extra occullar muscles intact.. External ears normal, . Neck: supple, no adenopathy,JVD or thyromegaly.No bruits.  Chest: Clear to ascultation bilaterally.No crackles or wheezes. Non tender to palpation    Cardiovascular system; Heart sounds normal,  S1 and  S2 ,no S3.  No murmur, or thrill. Apical beat not displaced Peripheral pulses normal.  Abdomen: Soft, non tender,  .   Musculoskeletal exam: Decreased  ROM of spine, adequate in hips , shoulders and knees.  deformity ,swelling and  crepitus noted. No muscle wasting or atrophy.   Neurologic: Cranial nerves 2 to 12 intact. Power, tone ,sensation and reflexes normal throughout. No disturbance in gait. No tremor.  Skin: Intact, no ulceration, erythema , scaling or rash noted. Pigmentation normal throughout  Psych; Normal mood and affect. Judgement and concentration normal   Assessment & Plan:  Annual physical exam Annual exam as documented. Counseling done  re healthy lifestyle involving commitment to 150 minutes exercise per week, heart healthy diet, and attaining healthy weight.The importance of adequate sleep also discussed. Regular seat belt use and home safety, is also discussed. Changes in health habits are decided on by the patient with goals and time frames  set for achieving them. Immunization and cancer screening needs are specifically addressed at this visit.

## 2021-01-09 NOTE — Progress Notes (Signed)
° ° °  [name]     MRN: [id number]      DOB: 20-Jan-1956  HPI: Patient is in for annual physical exam. No other health concerns are expressed or addressed at the visit. Recent labs,  are reviewed. Immunization is reviewed , and  updated if needed.   PE: Pleasant  female, alert and oriented x 3, in no cardio-pulmonary distress. Afebrile. HEENT No facial trauma or asymetry. Sinuses non tender.  Extra occullar muscles intact.. External ears normal, . Neck: supple, no adenopathy,JVD or thyromegaly.No bruits.  Chest: Clear to ascultation bilaterally.No crackles or wheezes. Non tender to palpation  Breast: No asymetry,no masses or lumps. No tenderness. No nipple discharge or inversion. No axillary or supraclavicular adenopathy  Cardiovascular system; Heart sounds normal,  S1 and  S2 ,no S3.  No murmur, or thrill. Apical beat not displaced Peripheral pulses normal.  Abdomen: Soft, non tender, no organomegaly or masses. No bruits. Bowel sounds normal. No guarding, tenderness or rebound.   GU: External genitalia normal female genitalia , normal female distribution of hair. No lesions. Urethral meatus normal in size, no  Prolapse, no lesions visibly  Present. Bladder non tender. Vagina pink and moist , with no visible lesions , discharge present . Adequate pelvic support no  cystocele or rectocele noted Cervix pink and appears healthy, no lesions or ulcerations noted, no discharge noted from os Uterus normal size, no adnexal masses, no cervical motion or adnexal tenderness.   Musculoskeletal exam: Full ROM of spine, hips , shoulders and knees. No deformity ,swelling or crepitus noted. No muscle wasting or atrophy.   Neurologic: Cranial nerves 2 to 12 intact. Power, tone ,sensation and reflexes normal throughout. No disturbance in gait. No tremor.  Skin: Intact, no ulceration, erythema , scaling or rash noted. Pigmentation normal throughout  Psych; Normal mood  and affect. Judgement and concentration normal   Assessment & Plan:  No problem-specific Assessment & Plan notes found for this encounter.

## 2021-01-09 NOTE — Patient Instructions (Signed)
F/U early feb, call if you need me sooner  You are referred to pharmacist re diabetic management  Work on changing food choice please , goal fasting sugar is 80 to 120  No medication change until pharmacy consultation as far as diabetic medication is concerned  Increase calcium to 1000 mg daily, so takee TWO calcium with D tabs  Take an additional vit D3, OTC  2000 IU every day, bones are thinning   It is important that you exercise regularly at least 30 minutes 5 times a week. If you develop chest pain, have severe difficulty breathing, or feel very tired, stop exercising immediately and seek medical attention   Non fasting HBA1C, chem 7 and eGFR 3 to 5 days before next appointment  Thanks for choosing [facility], we consider it a privelige to serve you.

## 2021-01-11 ENCOUNTER — Encounter: Payer: Self-pay | Admitting: Family Medicine

## 2021-01-11 NOTE — Assessment & Plan Note (Signed)

## 2021-01-12 ENCOUNTER — Telehealth: Payer: Self-pay | Admitting: *Deleted

## 2021-01-12 NOTE — Chronic Care Management (AMB) (Signed)
°  Chronic Care Management   Outreach Note  01/12/2021 Name: [name] MRN: [id number] DOB: [birth year]  [name] is a 65 y.o. year old female who is a primary care patient of Moshe Cipro Norwood Levo, MD. I reached out to [name] by phone today in response to a referral sent by [name]'s primary care provider.  A telephone outreach was attempted today. The patient was referred to the case management team for assistance with care management and care coordination.   Follow Up Plan: A HIPAA compliant phone message was left for the patient providing contact information and requesting a return call.  The care management team will reach out to the patient again over the next 7-14 days. If patient returns call to provider office, please advise to call Tse [facility] at [phone].  [place] [facility]  Direct Dial: [phone]

## 2021-01-20 NOTE — Chronic Care Management (AMB) (Signed)
Chronic Care Management   Note  01/20/2021 Name: [name] MRN: [id number] DOB: 1955-04-06  [name] is a 65 y.o. year old female who is a primary care patient of Moshe Cipro Norwood Levo, MD. I reached out to [name] by phone today in response to a referral sent by [name] PCP.  [name] was given information about Chronic Care Management services today including:  CCM service includes personalized support from designated clinical staff supervised by her physician, including individualized plan of care and coordination with other care providers 24/7 contact phone numbers for assistance for urgent and routine care needs. Service will only be billed when office clinical staff spend 20 minutes or more in a month to coordinate care. Only one practitioner may furnish and bill the service in a calendar month. The patient may stop CCM services at any time (effective at the end of the month) by phone call to the office staff. The patient is responsible for co-pay (up to 20% after annual deductible is met) if co-pay is required by the individual health plan.   Patient agreed to services and verbal consent obtained.   Follow up plan: Telephone appointment with care management team member scheduled for:02/03/21  [place] [facility]  Direct Dial: [phone]

## 2021-02-03 ENCOUNTER — Ambulatory Visit (INDEPENDENT_AMBULATORY_CARE_PROVIDER_SITE_OTHER): Payer: Medicare Other | Admitting: Pharmacist

## 2021-02-03 DIAGNOSIS — E1165 Type 2 diabetes mellitus with hyperglycemia: Secondary | ICD-10-CM

## 2021-02-03 DIAGNOSIS — I10 Essential (primary) hypertension: Secondary | ICD-10-CM

## 2021-02-03 DIAGNOSIS — E785 Hyperlipidemia, unspecified: Secondary | ICD-10-CM

## 2021-02-03 NOTE — Chronic Care Management (AMB) (Signed)
Chronic Care Management Pharmacy Note  02/03/2021 Name:  [name] MRN:  [id number] DOB:  [birth year]  Summary: Type 2 Diabetes Uncontrolled; Most recent A1c above goal of <7% per ADA guidelines Current medications: metformin 1,000 mg by mouth twice daily and glipizide XL 20 mg by mouth once daily Patient reports family history of pancreatitic cancer (mother) and pancreatitis (daughter). For this reason, GLP-1 agonists may not be ideal for her despite potential benefits.  Current meal patterns:  patient reports she has been working on improving her diet recently. She has cut back on sweets and has decreased soda intake from 2L per day to 1 can Current glucose readings:  patient checks blood glucose fasting daily. Verbally reports improvement in fasting blood glucose since increasing glipizide to 20 mg per day. Recently, blood glucose has been 119-130s; previously, it was 140-170s Continue current medications as above per primary care provider for now Discussed the need to decrease carbohydrate consumption. Specifically to reduce/eliminate sweetened beverages.  Patient identified as a good candidate for SGLT-2 inhibitor given reduction in cardiovascular disease, low risk of hypoglycemia, and weight loss. Patient denies a history of significant genitourinary infections.  No concern for hypotension/volume depletion.  GFR at least >20 mL/minute/1.73 m2. Patient would like to read and think more about this before initiating therapy.  Check A1c prior to next primary care provider visit  Overweight/Obesity May consider SGLT-2 inhibitor as above  Subjective: [name] is an 66 y.o. year old female who is a primary patient of Fayrene Helper, MD.  The CCM team was consulted for assistance with disease management and care coordination needs.    Engaged with patient by telephone for initial visit in response to provider referral for pharmacy case management and/or care coordination  services.   Consent to Services:  The patient was given the following information about Chronic Care Management services today, agreed to services, and gave verbal consent: 1. CCM service includes personalized support from designated clinical staff supervised by the primary care provider, including individualized plan of care and coordination with other care providers 2. 24/7 contact phone numbers for assistance for urgent and routine care needs. 3. Service will only be billed when office clinical staff spend 20 minutes or more in a month to coordinate care. 4. Only one practitioner may furnish and bill the service in a calendar month. 5.The patient may stop CCM services at any time (effective at the end of the month) by phone call to the office staff. 6. The patient will be responsible for cost sharing (Underwood-pay) of up to 20% of the service fee (after annual deductible is met). Patient agreed to services and consent obtained.  Patient Care Team: Fayrene Helper, MD as PCP - General Beryle Lathe, [facility] (Pharmacist)  Objective:  Lab Results  Component Value Date   CREATININE 0.90 11/11/2020   CREATININE 1.03 (H) 05/02/2020   CREATININE 0.85 12/25/2019    Lab Results  Component Value Date   HGBA1C 7.7 (H) 11/11/2020   Last diabetic Eye exam:  Lab Results  Component Value Date/Time   HMDIABEYEEXA No Retinopathy 11/06/2020 12:00 AM    Last diabetic Foot exam: No results found for: HMDIABFOOTEX      Component Value Date/Time   CHOL 128 11/11/2020 0922   TRIG 145 11/11/2020 0922   HDL 49 11/11/2020 0922   CHOLHDL 2.6 11/11/2020 0922   CHOLHDL 2.6 08/08/2019 1100   VLDL 22 03/19/2016 0933   LDLCALC 54 11/11/2020  1886   LDLCALC 66 08/08/2019 1100    Hepatic Function Latest Ref Rng & Units 11/11/2020 05/02/2020 08/08/2019  Total Protein 6.0 - 8.5 g/dL 6.6 7.1 7.0  Albumin 3.8 - 4.8 g/dL 4.1 4.3 -  AST 0 - 40 IU/L '24 27 23  ' ALT 0 - 32 IU/L '17 20 18  ' Alk Phosphatase 44 - 121  IU/L 82 99 -  Total Bilirubin 0.0 - 1.2 mg/dL 0.5 0.5 0.3  Bilirubin, Direct 0.0 - 0.2 mg/dL - - 0.1    Lab Results  Component Value Date/Time   TSH 0.763 11/11/2020 09:22 AM   TSH 1.240 05/02/2020 08:16 AM    CBC Latest Ref Rng & Units 11/11/2020 12/07/2018 04/07/2018  WBC 3.4 - 10.8 x10E3/uL 9.0 9.5 6.9  Hemoglobin 11.1 - 15.9 g/dL 13.5 13.7 12.5  Hematocrit 34.0 - 46.6 % 40.4 41.3 37.5  Platelets 150 - 450 x10E3/uL 336 403(H) 411(H)    Lab Results  Component Value Date/Time   VD25OH 34.8 05/02/2020 08:16 AM   VD25OH 31 12/07/2018 09:05 AM    Clinical ASCVD: No  The ASCVD Risk score (Arnett DK, et al., 2019) failed to calculate for the following reasons:   The valid total cholesterol range is 130 to 320 mg/dL    Social History   Tobacco Use  Smoking Status Never  Smokeless Tobacco Never   BP Readings from Last 3 Encounters:  01/09/21 107/75  11/18/20 130/78  11/17/20 112/84   Pulse Readings from Last 3 Encounters:  01/09/21 84  11/18/20 88  11/17/20 75   Wt Readings from Last 3 Encounters:  01/09/21 299 lb 0.6 oz (135.6 kg)  11/18/20 297 lb (134.7 kg)  11/17/20 297 lb (134.7 kg)    Assessment: Review of patient past medical history, allergies, medications, health status, including review of consultants reports, laboratory and other test data, was performed as part of comprehensive evaluation and provision of chronic care management services.   SDOH:  (Social Determinants of Health) assessments and interventions performed:    CCM Care Plan  Allergies  Allergen Reactions   Naproxen Sodium Hives and Swelling    MD thought it was the pink dye in the naproxen   Tsh [Thyrotropin]    Statins Other (See Comments)    Reports increased appetite and possible muscle ache; tolerating rosuvastatin 40 mg daily    Medications Reviewed Today     Reviewed by Beryle Lathe, [facility] (Pharmacist) on 02/03/21 at [place] List Status: <None>   Medication Order  Taking? Sig Documenting Provider Last Dose Status Informant  aspirin 81 MG tablet [id number] Yes Take 81 mg by mouth daily. [provider] Taking Active Self  benazepril (LOTENSIN) 5 MG tablet [id number] Yes TAKE TWO (2) TABLETS BY MOUTH EVERY DAY Fayrene Helper, MD Taking Active   blood glucose meter kit and supplies [id number]  Dispense based on patient and insurance preference. Once daily testing dx e11.9 Fayrene Helper, MD  Active   calcium-vitamin D (OSCAL WITH D) 500-200 MG-UNIT per tablet [id number] Yes Take 1 tablet by mouth daily. [provider] Taking Active Self  cyclobenzaprine (FLEXERIL) 5 MG tablet [id number] Yes TAKE ONE TABLET BY MOUTH AT BEDTIME AS NEEDED. SHOULD LAST SIX MONTHS. Fayrene Helper, MD Taking Active            Med Note Moshe Cipro, MARGARET E   Mon Apr 25, 2017  8:52 AM) Dewaine Conger on avg 2 to 3 times per week  Dexlansoprazole 30  MG capsule [id number] Yes Take 30 mg by mouth daily. [provider] Taking Active            Med Note Moshe Cipro, Pearla Dubonnet Dec 13, 2018  8:36 AM) Takes daily in 11/2018  FLONASE 50 MCG/ACT nasal spray [id number] Yes Place 2 sprays into both nostrils daily. Fayrene Helper, MD Taking Active            Med Note Waldo Laine, Gwenyth Allegra   Tue Feb 03, 2021  4:00 PM) Seasonal for allergies  furosemide (LASIX) 20 MG tablet [id number] Yes TAKE ONE TABLET BY MOUTH ONCE DAILY FOR LEG SWELLING. Fayrene Helper, MD Taking Active            Med Note Renee Rival   Tue Nov 18, 2020  8:06 AM) Pt takes med as needed.  gabapentin (NEURONTIN) 300 MG capsule [id number] Yes TAKE ONE CAPSULE BY MOUTH TWICE DAILY Fayrene Helper, MD Taking Active   glipiZIDE (GLIPIZIDE XL) 10 MG 24 hr tablet [id number] Yes Take two tablets once daily every morning with breakfast Fayrene Helper, MD Taking Active   glucose blood [facility] VERIO) test strip [id number]  USE TO TEST BLOOD SUGAR EVERY DAY Renee Rival, FNP   Active   ibuprofen (ADVIL) 800 MG tablet [id number] Yes Take 1 tablet (800 mg total) by mouth every 8 (eight) hours as needed. Fayrene Helper, MD Taking Active   Influenza (>/= 3 years) inactive virus vaccine (FLVIRIN/FLUZONE) injection SUSP 0.5 mL [id number]   Fayrene Helper, MD  Active   levothyroxine (SYNTHROID) 150 MCG tablet [id number] Yes TAKE ONE TABLET BY MOUTH DAILY. Fayrene Helper, MD Taking Active   metFORMIN (GLUCOPHAGE) 1000 MG tablet [id number] Yes TAKE ONE TABLET BY MOUTH TWICE DAILY WITH A MEAL Fayrene Helper, MD Taking Active   methylPREDNISolone acetate (DEPO-MEDROL) injection 120 mg [id number]   Fayrene Helper, MD  Active   montelukast (SINGULAIR) 10 MG tablet [id number] Yes TAKE ONE TABLET BY MOUTH AT BEDTIME Fayrene Helper, MD Taking Active   OneTouch [place] Lancets 49Q MISC [id number]  Once daily testing dx e11.9 Fayrene Helper, MD  Active   oxyCODONE-acetaminophen (PERCOCET) 7.5-325 MG tablet [id number] Yes Take one tablet once daily , as needed , for uncontrolled back pain Fayrene Helper, MD Taking Active   potassium chloride SA (KLOR-CON) 20 MEQ tablet [id number] Yes TAKE ONE TABLET BY MOUTH EVERY DAY AS NEEDED ON DAYS YOU TAKE FUROSEMIDE FOR FOR SWELLING Fayrene Helper, MD Taking Active   rosuvastatin (CRESTOR) 40 MG tablet [id number] Yes Take 1 tablet (40 mg total) by mouth daily. Renee Rival, FNP Taking Active   traZODone (DESYREL) 100 MG tablet [id number] Yes TAKE ONE TABLET BY MOUTH AT BEDTIME Fayrene Helper, MD Taking Active   Turmeric 500 MG CAPS [id number] Yes Take 1 capsule by mouth daily. [provider] Taking Active Self            Patient Active Problem List   Diagnosis Date Noted   Right knee pain 05/07/2020   Strain of muscle, fascia and tendon of lower back, initial encounter 03/21/2020   Statin intolerance 07/24/2017   Chronic pain 11/24/2016   Snoring 07/20/2016   Carotid bruit present  11/10/2015   Right hip pain 10/25/2014   Annual physical exam 42/87/6811   Metabolic syndrome X 57/26/2035   Cervical neck pain with evidence of disc disease 10/19/2010   Type 2 diabetes mellitus  with hyperglycemia ([place]) 01/22/2010   Hypothyroidism 06/08/2007   Hyperlipidemia 06/08/2007   Morbid obesity ([place]) 06/08/2007   Depression with anxiety 06/08/2007   Hypertension goal BP (blood pressure) < 130/80 06/08/2007   GERD 06/08/2007   Back pain with radiculopathy 06/08/2007    Immunization History  Administered Date(s) Administered   Fluad Quad(high Dose 65+) 11/18/2020   Influenza Whole 12/03/2008, 10/19/2010   Influenza,inj,Quad PF,6+ Mos 12/27/2012, 11/01/2013, 10/25/2014, 11/10/2015, 11/24/2016, 12/08/2017, 12/13/2018, 09/25/2019   PNEUMOCOCCAL CONJUGATE-20 11/18/2020   Pneumococcal Conjugate-13 04/11/2014   Pneumococcal Polysaccharide-23 03/05/2013   Td 12/03/2008   Tdap 12/13/2018   Zoster Recombinat (Shingrix) 07/21/2017, 01/05/2018    Conditions to be addressed/monitored: HTN, HLD, DMII, and Obesity  Care Plan : Medication Management  Updates made by Beryle Lathe, [place] since 02/03/2021 12:00 AM     Problem: T2DM, HTN, HLD, Obesity   Priority: High  Onset Date: 02/03/2021     Long-Range Goal: Disease Progression Prevention   Start Date: 02/03/2021  Expected End Date: 05/04/2021  This Visit's Progress: On track  Priority: High  Note:   Current Barriers:  Unable to achieve control of diabetes and weight management  Pharmacist Clinical Goal(s):  Through collaboration with PharmD and provider, patient will  Achieve control of diabetes and weight management as evidenced by improved fasting blood sugar, improved A1c, and continued weight loss   Interventions: 1:1 collaboration with Fayrene Helper, MD regarding development and update of comprehensive plan of care as evidenced by provider attestation and Underwood-signature Inter-disciplinary care team  collaboration (see longitudinal plan of care) Comprehensive medication review performed; medication list updated in electronic medical record  Type 2 Diabetes - New goal.: Uncontrolled; Most recent A1c above goal of <7% per ADA guidelines Current medications: metformin 1,000 mg by mouth twice daily and glipizide XL 20 mg by mouth once daily Patient reports family history of pancreatitic cancer (mother) and pancreatitis (daughter). For this reason, GLP-1 agonists may not be ideal for her despite potential benefits.  Intolerances: none Taking medications as directed: yes Side effects thought to be attributed to current medication regimen: no Denies hypoglycemic symptoms (sweaty and shaky). Hypoglycemia prevention: not indicated at this time Current meal patterns:  patient reports she has been working on improving her diet recently. She has cut back on sweets and has decreased soda intake from 2L per day to 1 can Current exercise:  stationary bike daily On a statin: yes On aspirin 81 mg daily: yes Last microalbumin/creatinine ratio: <6 (08/07/20); on an ACEi/ARB: yes Last eye exam: completed within last year Last foot exam: completed within last year Pneumonia vaccine: series complete Influenza vaccine: up to date Shingrix: series complete Current glucose readings:  patient checks blood glucose fasting daily. Verbally reports improvement in fasting blood glucose since increasing glipizide to 20 mg per day. Recently, blood glucose has been 119-130s; previously, it was 140-170s Continue current medications as above per primary care provider for now Instructed to monitor blood sugars once a day at the following times: fasting (at least 8 hours since last food consumption) and whenever patient experiences symptoms of hypo/hyperglycemia  Encouraged regular aerobic exercise with a goal of 30 minutes five times per week (150 minutes per week) Discussed the need to decrease carbohydrate consumption.  Specifically to reduce/eliminate sweetened beverages.  Patient identified as a good candidate for SGLT-2 inhibitor given reduction in cardiovascular disease, low risk of hypoglycemia, and weight loss. Patient denies a history of significant genitourinary infections.  No concern for hypotension/volume  depletion.  GFR at least >20 mL/minute/1.73 m2. Patient would like to read and think more about this before initiating therapy.  Check A1c prior to next primary care provider visit  Hypertension - New goal.: Blood pressure under good control. Blood pressure is at goal of <130/80 mmHg per 2017 AHA/ACC guidelines. Current medications: benazepril 10 mg by mouth once daily Intolerances: none Taking medications as directed: yes Side effects thought to be attributed to current medication regimen: no Current home blood pressure: not discussed today Continue current medications as above Encourage dietary sodium restriction/DASH diet Recommend home blood pressure monitoring to discuss at next visit Discussed need for and importance of continued work on weight loss  Hyperlipidemia - New goal.: Controlled. LDL at goal of <70 due to very high risk given diabetes + at least 1 additional major risk factor (hypertension) per 2020 AACE/ACE guidelines. Triglycerides at goal of <150 per 2020 AACE/ACE guidelines. Current medications: rosuvastatin 40 mg by mouth once daily Intolerances: none Taking medications as directed: yes Side effects thought to be attributed to current medication regimen: no Continue current medications as above Encourage dietary reduction of high fat containing foods such as butter, nuts, bacon, egg yolks, etc. Recommend regular aerobic exercise Discussed need for and importance of continued work on weight loss  Overweight/Obesity - New goal.: Unable to achieve goal weight loss through lifestyle modification alone Current treatment:  none   Medications previously tried:  none Patient  reports family history of pancreatitic cancer (mother) and pancreatitis (daughter). For this reason, GLP-1 agonists may not be ideal for her despite potential benefits.  Strategies previously tried: unknown History of bariatric surgery:  unknown Baseline weight: 299 lbs; most recent weight: 299 lbs Recommend that the patient emphasize lean proteins, fruits and vegetables, whole grains and increased fiber consumption, adequate hydration Recommend diet modification to induce energy deficit of 500 kcal/day or greater Encouraged regular aerobic exercise with a goal of 30 minutes five times per week (150 minutes per week) May consider SGLT-2 inhibitor as above  Patient Goals/Self-Care Activities Patient will:  Take medications as prescribed Check blood sugar once a day at the following times: fasting (at least 8 hours since last food consumption) and whenever patient experiences symptoms of hypo/hyperglycemia, document, and provide at future appointments Check blood pressure at least once daily, document, and provide at future appointments Target a minimum of 150 minutes of moderate intensity exercise weekly Engage in dietary modifications by fewer sweetened foods & beverages  Follow Up Plan: Face to Face appointment with care management team member scheduled for: 03/19/21      Medication Assistance: None required.  Patient affirms current coverage meets needs.  Patient's preferred pharmacy is:  [place], [address] [place] [address] Phone: [phone] Fax: [place], [place] [address] AT [place] [place] [address] Phone: [phone] Fax: [phone]  [id number] [place], [address] [address] AT [place] [address] Phone: [phone] Fax:  [phone]  Follow Up:  Patient agrees to Care Plan and Follow-up.  Plan: Face to Face appointment with care management team member scheduled for: 03/19/21  Kennon Holter, PharmD, [place], CPP Clinical Pharmacist Practitioner [facility] [phone]

## 2021-02-03 NOTE — Patient Instructions (Signed)
[name],  It was great to talk to you today!  Please call me with any questions or concerns.   Visit Information   Following is a copy of your full care plan:  Care Plan : Medication Management  Updates made by Beryle Lathe, [place] since 02/03/2021 12:00 AM     Problem: T2DM, HTN, HLD, Obesity   Priority: High  Onset Date: 02/03/2021     Long-Range Goal: Disease Progression Prevention   Start Date: 02/03/2021  Expected End Date: 05/04/2021  This Visit's Progress: On track  Priority: High  Note:   Current Barriers:  Unable to achieve control of diabetes and weight management  Pharmacist Clinical Goal(s):  Through collaboration with PharmD and provider, patient will  Achieve control of diabetes and weight management as evidenced by improved fasting blood sugar, improved A1c, and continued weight loss   Interventions: 1:1 collaboration with Fayrene Helper, MD regarding development and update of comprehensive plan of care as evidenced by provider attestation and co-signature Inter-disciplinary care team collaboration (see longitudinal plan of care) Comprehensive medication review performed; medication list updated in electronic medical record  Type 2 Diabetes - New goal.: Uncontrolled; Most recent A1c above goal of <7% per ADA guidelines Current medications: metformin 1,000 mg by mouth twice daily and glipizide XL 20 mg by mouth once daily Patient reports family history of pancreatitic cancer (mother) and pancreatitis (daughter). For this reason, GLP-1 agonists may not be ideal for her despite potential benefits.  Intolerances: none Taking medications as directed: yes Side effects thought to be attributed to current medication regimen: no Denies hypoglycemic symptoms (sweaty and shaky). Hypoglycemia prevention: not indicated at this time Current meal patterns:  patient reports she has been working on improving her diet recently. She has cut back on sweets and  has decreased soda intake from 2L per day to 1 can Current exercise:  stationary bike daily On a statin: yes On aspirin 81 mg daily: yes Last microalbumin/creatinine ratio: <6 (08/07/20); on an ACEi/ARB: yes Last eye exam: completed within last year Last foot exam: completed within last year Pneumonia vaccine: series complete Influenza vaccine: up to date Shingrix: series complete Current glucose readings:  patient checks blood glucose fasting daily. Verbally reports improvement in fasting blood glucose since increasing glipizide to 20 mg per day. Recently, blood glucose has been 119-130s; previously, it was 140-170s Continue current medications as above per primary care provider for now Instructed to monitor blood sugars once a day at the following times: fasting (at least 8 hours since last food consumption) and whenever patient experiences symptoms of hypo/hyperglycemia  Encouraged regular aerobic exercise with a goal of 30 minutes five times per week (150 minutes per week) Discussed the need to decrease carbohydrate consumption. Specifically to reduce/eliminate sweetened beverages.  Patient identified as a good candidate for SGLT-2 inhibitor given reduction in cardiovascular disease, low risk of hypoglycemia, and weight loss. Patient denies a history of significant genitourinary infections.  No concern for hypotension/volume depletion.  GFR at least >20 mL/minute/1.73 m2. Patient would like to read and think more about this before initiating therapy.  Check A1c prior to next primary care provider visit  Hypertension - New goal.: Blood pressure under good control. Blood pressure is at goal of <130/80 mmHg per 2017 AHA/ACC guidelines. Current medications: benazepril 10 mg by mouth once daily Intolerances: none Taking medications as directed: yes Side effects thought to be attributed to current medication regimen: no Current home blood pressure: not discussed today  Continue current  medications as above Encourage dietary sodium restriction/DASH diet Recommend home blood pressure monitoring to discuss at next visit Discussed need for and importance of continued work on weight loss  Hyperlipidemia - New goal.: Controlled. LDL at goal of <70 due to very high risk given diabetes + at least 1 additional major risk factor (hypertension) per 2020 AACE/ACE guidelines. Triglycerides at goal of <150 per 2020 AACE/ACE guidelines. Current medications: rosuvastatin 40 mg by mouth once daily Intolerances: none Taking medications as directed: yes Side effects thought to be attributed to current medication regimen: no Continue current medications as above Encourage dietary reduction of high fat containing foods such as butter, nuts, bacon, egg yolks, etc. Recommend regular aerobic exercise Discussed need for and importance of continued work on weight loss  Overweight/Obesity - New goal.: Unable to achieve goal weight loss through lifestyle modification alone Current treatment:  none   Medications previously tried:  none Patient reports family history of pancreatitic cancer (mother) and pancreatitis (daughter). For this reason, GLP-1 agonists may not be ideal for her despite potential benefits.  Strategies previously tried: unknown History of bariatric surgery:  unknown Baseline weight: 299 lbs; most recent weight: 299 lbs Recommend that the patient emphasize lean proteins, fruits and vegetables, whole grains and increased fiber consumption, adequate hydration Recommend diet modification to induce energy deficit of 500 kcal/day or greater Encouraged regular aerobic exercise with a goal of 30 minutes five times per week (150 minutes per week) May consider SGLT-2 inhibitor as above  Patient Goals/Self-Care Activities Patient will:  Take medications as prescribed Check blood sugar once a day at the following times: fasting (at least 8 hours since last food consumption) and whenever  patient experiences symptoms of hypo/hyperglycemia, document, and provide at future appointments Check blood pressure at least once daily, document, and provide at future appointments Target a minimum of 150 minutes of moderate intensity exercise weekly Engage in dietary modifications by fewer sweetened foods & beverages  Follow Up Plan: Face to Face appointment with care management team member scheduled for: 03/19/21      Consent to CCM Services: Ms. Ferraiolo was given information about Chronic Care Management services including:  CCM service includes personalized support from designated clinical staff supervised by her physician, including individualized plan of care and coordination with other care providers 24/7 contact phone numbers for assistance for urgent and routine care needs. Service will only be billed when office clinical staff spend 20 minutes or more in a month to coordinate care. Only one practitioner may furnish and bill the service in a calendar month. The patient may stop CCM services at any time (effective at the end of the month) by phone call to the office staff. The patient will be responsible for cost sharing (co-pay) of up to 20% of the service fee (after annual deductible is met).  Patient agreed to services and verbal consent obtained.   Plan: Face to Face appointment with care management team member scheduled for: 03/19/21  Kennon Holter, PharmD, BCACP, CPP Clinical Pharmacist Practitioner [facility] [phone]  Please call the care guide team at [phone] if you need to cancel or reschedule your appointment.   Patient verbalizes understanding of instructions provided today and agrees to view in [place].

## 2021-02-09 ENCOUNTER — Other Ambulatory Visit: Payer: Self-pay

## 2021-02-09 ENCOUNTER — Ambulatory Visit (INDEPENDENT_AMBULATORY_CARE_PROVIDER_SITE_OTHER): Payer: Medicare Other

## 2021-02-09 DIAGNOSIS — Z0000 Encounter for general adult medical examination without abnormal findings: Secondary | ICD-10-CM

## 2021-02-09 NOTE — Patient Instructions (Addendum)
[name] , Thank you for taking time to come for your Medicare Wellness Visit. I appreciate your ongoing commitment to your health goals. Please review the following plan we discussed and let me know if I can assist you in the future.    These are the goals we discussed:  Goals      Increase physical activity     Medication Management     Patient Goals/Self-Care Activities Patient will:  Take medications as prescribed Check blood sugar once a day at the following times: fasting (at least 8 hours since last food consumption) and whenever patient experiences symptoms of hypo/hyperglycemia, document, and provide at future appointments Check blood pressure at least once daily, document, and provide at future appointments Target a minimum of 150 minutes of moderate intensity exercise weekly Engage in dietary modifications by fewer sweetened foods & beverages      Weight (lb) < 200 lb (90.7 kg)        This is a list of the screening recommended for you and due dates:  Health Maintenance  Topic Date Due   Hemoglobin A1C  05/12/2021   Eye exam for diabetics  11/06/2021   Complete foot exam   01/09/2022   Mammogram  07/17/2022   Colon Cancer Screening  12/20/2022   Pap Smear  08/08/2023   Tetanus Vaccine  12/12/2028   Pneumonia Vaccine  Completed   Flu Shot  Completed   DEXA scan (bone density measurement)  Completed   Hepatitis C Screening: USPSTF Recommendation to screen - Ages 51-79 yo.  Completed   HIV Screening  Completed   Zoster (Shingles) Vaccine  Completed   HPV Vaccine  Aged Out   COVID-19 Vaccine  Discontinued     Health Maintenance, Female Adopting a healthy lifestyle and getting preventive care are important in promoting health and wellness. Ask your health care provider about: The right schedule for you to have regular tests and exams. Things you can do on your own to prevent diseases and keep yourself healthy. What should I know about diet, weight, and  exercise? Eat a healthy diet  Eat a diet that includes plenty of vegetables, fruits, low-fat dairy products, and lean protein. Do not eat a lot of foods that are high in solid fats, added sugars, or sodium. Maintain a healthy weight Body mass index (BMI) is used to identify weight problems. It estimates body fat based on height and weight. Your health care provider can help determine your BMI and help you achieve or maintain a healthy weight. Get regular exercise Get regular exercise. This is one of the most important things you can do for your health. Most adults should: Exercise for at least 150 minutes each week. The exercise should increase your heart rate and make you sweat (moderate-intensity exercise). Do strengthening exercises at least twice a week. This is in addition to the moderate-intensity exercise. Spend less time sitting. Even light physical activity can be beneficial. Watch cholesterol and blood lipids Have your blood tested for lipids and cholesterol at 66 years of age, then have this test every 5 years. Have your cholesterol levels checked more often if: Your lipid or cholesterol levels are high. You are older than 66 years of age. You are at high risk for heart disease. What should I know about cancer screening? Depending on your health history and family history, you may need to have cancer screening at various ages. This may include screening for: Breast cancer. Cervical cancer. Colorectal cancer. Skin  cancer. Lung cancer. What should I know about heart disease, diabetes, and high blood pressure? Blood pressure and heart disease High blood pressure causes heart disease and increases the risk of stroke. This is more likely to develop in people who have high blood pressure readings or are overweight. Have your blood pressure checked: Every 3-5 years if you are 4-82 years of age. Every year if you are 62 years old or older. Diabetes Have regular diabetes  screenings. This checks your fasting blood sugar level. Have the screening done: Once every three years after age 80 if you are at a normal weight and have a low risk for diabetes. More often and at a younger age if you are overweight or have a high risk for diabetes. What should I know about preventing infection? Hepatitis B If you have a higher risk for hepatitis B, you should be screened for this virus. Talk with your health care provider to find out if you are at risk for hepatitis B infection. Hepatitis C Testing is recommended for: Everyone born from [birth year] through 1965. Anyone with known risk factors for hepatitis C. Sexually transmitted infections (STIs) Get screened for STIs, including gonorrhea and chlamydia, if: You are sexually active and are younger than 66 years of age. You are older than 66 years of age and your health care provider tells you that you are at risk for this type of infection. Your sexual activity has changed since you were last screened, and you are at increased risk for chlamydia or gonorrhea. Ask your health care provider if you are at risk. Ask your health care provider about whether you are at high risk for HIV. Your health care provider may recommend a prescription medicine to help prevent HIV infection. If you choose to take medicine to prevent HIV, you should first get tested for HIV. You should then be tested every 3 months for as long as you are taking the medicine. Pregnancy If you are about to stop having your period (premenopausal) and you may become pregnant, seek counseling before you get pregnant. Take 400 to 800 micrograms (mcg) of folic acid every day if you become pregnant. Ask for birth control (contraception) if you want to prevent pregnancy. Osteoporosis and menopause Osteoporosis is a disease in which the bones lose minerals and strength with aging. This can result in bone fractures. If you are 40 years old or older, or if you are at risk for  osteoporosis and fractures, ask your health care provider if you should: Be screened for bone loss. Take a calcium or vitamin D supplement to lower your risk of fractures. Be given hormone replacement therapy (HRT) to treat symptoms of menopause. Follow these instructions at home: Alcohol use Do not drink alcohol if: Your health care provider tells you not to drink. You are pregnant, may be pregnant, or are planning to become pregnant. If you drink alcohol: Limit how much you have to: 0-1 drink a day. Know how much alcohol is in your drink. In the [place] one drink equals one 12 oz bottle of beer (355 mL), one 5 oz glass of wine (148 mL), or one 1 oz glass of hard liquor (44 mL). Lifestyle Do not use any products that contain nicotine or tobacco. These products include cigarettes, chewing tobacco, and vaping devices, such as e-cigarettes. If you need help quitting, ask your health care provider. Do not use street drugs. Do not share needles. Ask your health care provider for help if you need  support or information about quitting drugs. General instructions Schedule regular health, dental, and eye exams. Stay current with your vaccines. Tell your health care provider if: You often feel depressed. You have ever been abused or do not feel safe at home. Summary Adopting a healthy lifestyle and getting preventive care are important in promoting health and wellness. Follow your health care provider's instructions about healthy diet, exercising, and getting tested or screened for diseases. Follow your health care provider's instructions on monitoring your cholesterol and blood pressure. This information is not intended to replace advice given to you by your health care provider. Make sure you discuss any questions you have with your health care provider. Document Revised: 06/02/2020 Document Reviewed: 06/02/2020 Elsevier Patient Education  [place].

## 2021-02-09 NOTE — Progress Notes (Signed)
I connected with  [name] on 02/09/21 by a audio enabled telemedicine application and verified that I am speaking with the correct person using two identifiers.  Patient Location: Home  Provider Location: Home Office  I discussed the limitations of evaluation and management by telemedicine. The patient expressed understanding and agreed to proceed.   Subjective:   [name] is a 66 y.o. female who presents for Medicare Annual (Subsequent) preventive examination.  Review of Systems     Cardiac Risk Factors include: diabetes mellitus;dyslipidemia;hypertension;obesity (BMI >30kg/m2)     Objective:    There were no vitals filed for this visit. There is no height or weight on file to calculate BMI.  Advanced Directives 12/24/2019 08/16/2019 12/18/2018 01/05/2018 08/11/2016  Does Patient Have a Medical Advance Directive? _0   Would patient like information on creating a medical advance directive? No - Patient declined Yes (MAU/Ambulatory/Procedural Areas - Information given) No - Patient declined No - Patient declined Yes (ED - Information included in AVS)    Current Medications (verified) Outpatient Encounter Medications as of 02/09/2021  Medication Sig   aspirin 81 MG tablet Take 81 mg by mouth daily.   benazepril (LOTENSIN) 5 MG tablet TAKE TWO (2) TABLETS BY MOUTH EVERY DAY   blood glucose meter kit and supplies Dispense based on patient and insurance preference. Once daily testing dx e11.9   calcium-vitamin D (OSCAL WITH D) 500-200 MG-UNIT per tablet Take 1 tablet by mouth daily.   cyclobenzaprine (FLEXERIL) 5 MG tablet TAKE ONE TABLET BY MOUTH AT BEDTIME AS NEEDED. SHOULD LAST SIX MONTHS.   Dexlansoprazole 30 MG capsule Take 30 mg by mouth daily.   FLONASE 50 MCG/ACT nasal spray Place 2 sprays into both nostrils daily.   furosemide (LASIX) 20 MG tablet TAKE ONE TABLET BY MOUTH ONCE DAILY FOR LEG SWELLING.   gabapentin (NEURONTIN) 300 MG capsule TAKE ONE  CAPSULE BY MOUTH TWICE DAILY   glipiZIDE (GLIPIZIDE XL) 10 MG 24 hr tablet Take two tablets once daily every morning with breakfast   glucose blood (ONETOUCH VERIO) test strip USE TO TEST BLOOD SUGAR EVERY DAY   ibuprofen (ADVIL) 800 MG tablet Take 1 tablet (800 mg total) by mouth every 8 (eight) hours as needed.   levothyroxine (SYNTHROID) 150 MCG tablet TAKE ONE TABLET BY MOUTH DAILY.   metFORMIN (GLUCOPHAGE) 1000 MG tablet TAKE ONE TABLET BY MOUTH TWICE DAILY WITH A MEAL   montelukast (SINGULAIR) 10 MG tablet TAKE ONE TABLET BY MOUTH AT BEDTIME   OneTouch Delica Lancets 15B MISC Once daily testing dx e11.9   oxyCODONE-acetaminophen (PERCOCET) 7.5-325 MG tablet Take one tablet once daily , as needed , for uncontrolled back pain   potassium chloride SA (KLOR-CON) 20 MEQ tablet TAKE ONE TABLET BY MOUTH EVERY DAY AS NEEDED ON DAYS YOU TAKE FUROSEMIDE FOR FOR SWELLING   rosuvastatin (CRESTOR) 40 MG tablet Take 1 tablet (40 mg total) by mouth daily.   traZODone (DESYREL) 100 MG tablet TAKE ONE TABLET BY MOUTH AT BEDTIME   Turmeric 500 MG CAPS Take 1 capsule by mouth daily.   Facility-Administered Encounter Medications as of 02/09/2021  Medication   Influenza (>/= 3 years) inactive virus vaccine (FLVIRIN/FLUZONE) injection SUSP 0.5 mL   methylPREDNISolone acetate (DEPO-MEDROL) injection 120 mg    Allergies (verified) Naproxen sodium, Tsh [thyrotropin], and Statins   History: Past Medical History:  Diagnosis Date   Allergy    Anemia    Arthritis  Cataract    forming   Diabetes mellitus without complication ([place]) 4742   GERD (gastroesophageal reflux disease)    Hyperlipidemia    Hypertension 2011   Morbid obesity ([place])    Neuromuscular disorder ([place])    issues with nerves in back    Osteopenia    Thyroid disease    Past Surgical History:  Procedure Laterality Date   ABDOMINAL HYSTERECTOMY     APPENDECTOMY     CHOLECYSTECTOMY     COLONOSCOPY  2009   [facility] - normal per  pt    TONSILLECTOMY     Family History  Problem Relation Age of Onset   Asthma Mother    Hypertension Mother    Heart disease Mother 22       cabg   Diabetes Father    Hypertension Father    Heart disease Father 11       cabg   Cancer Sister        breast, uterine   Cancer Maternal Grandmother    Cancer Paternal Grandfather    Cancer Sister    Prostate cancer Paternal Grandmother    Colon polyps Neg Hx    Colon cancer Neg Hx    Esophageal cancer Neg Hx    Rectal cancer Neg Hx    Stomach cancer Neg Hx    Social History   Socioeconomic History   Marital status: Married    Spouse name: Rafael    Number of children: 2   Years of education: 12   Highest education level: 12th grade  Occupational History   Occupation: disabled   Tobacco Use   Smoking status: Never   Smokeless tobacco: Never  Substance and Sexual Activity   Alcohol use: No   Drug use: No   Sexual activity: Not Currently  Other Topics Concern   Not on file  Social History Narrative   Lives with Husband alone    Social Determinants of Health   Financial Resource Strain: Not on file  Food Insecurity: No Food Insecurity   Worried About Charity fundraiser in the Last Year: Never true   [place] in the Last Year: Never true  Transportation Needs: No Transportation Needs   Lack of Transportation (Medical): No   Lack of Transportation (Non-Medical): No  Physical Activity: Insufficiently Active   Days of Exercise per Week: 4 days   Minutes of Exercise per Session: 30 min  Stress: Not on file  Social Connections: Moderately Isolated   Frequency of Communication with Friends and Family: More than three times a week   Frequency of Social Gatherings with Friends and Family: More than three times a week   Attends Religious Services: More than 4 times per year   Active Member of [employer] or Organizations: No   Attends Archivist Meetings: Never   Marital Status: Separated    Tobacco  Counseling Counseling given: Not Answered   Clinical Intake:  Pre-visit preparation completed: No  Pain : No/denies pain     Nutritional Status: BMI > 30  Obese Diabetes: Yes CBG done?: No Did pt. bring in CBG monitor from home?: No  How often do you need to have someone help you when you read instructions, pamphlets, or other written materials from your doctor or pharmacy?: 1 - Never What is the last grade level you completed in school?: college  Diabetic?Nutrition Risk Assessment:  Has the patient had any N/V/D within the last 2 months?  No  Does the patient have any non-healing wounds?  No  Has the patient had any unintentional weight loss or weight gain?  No   Diabetes:  Is the patient diabetic?  Yes  If diabetic, was a CBG obtained today?  No  Did the patient bring in their glucometer from home?  No  How often do you monitor your CBG's? daily.   Financial Strains and Diabetes Management:  Are you having any financial strains with the device, your supplies or your medication? No .  Does the patient want to be seen by Chronic Care Management for management of their diabetes?  No  Would the patient like to be referred to a Nutritionist or for Diabetic Management?  No   Diabetic Exams:  Diabetic Eye Exam: Completed yes Diabetic Foot Exam: Completed yes    Interpreter Needed?: No      Activities of Daily Living In your present state of health, do you have any difficulty performing the following activities: 02/09/2021  Hearing? N  Vision? N  Difficulty concentrating or making decisions? N  Walking or climbing stairs? N  Dressing or bathing? N  Doing errands, shopping? N  Preparing Food and eating ? N  Using the Toilet? N  In the past six months, have you accidently leaked urine? N  Do you have problems with loss of bowel control? N  Managing your Medications? N  Managing your Finances? N  Housekeeping or managing your Housekeeping? N  Some recent data  might be hidden    Patient Care Team: Fayrene Helper, MD as PCP - General Beryle Lathe, [facility] (Pharmacist)  Indicate any recent Medical Services you may have received from other than [facility] providers in the past year (date may be approximate).     Assessment:   This is a routine wellness examination for Betta.  Hearing/Vision screen No results found.  Dietary issues and exercise activities discussed: Current Exercise Habits: Home exercise routine, Type of exercise: walking, Time (Minutes): 30, Frequency (Times/Week): 3, Weekly Exercise (Minutes/Week): 90, Intensity: Mild, Exercise limited by: orthopedic condition(s)   Goals Addressed   None    Depression Screen PHQ 2/9 Scores 01/09/2021 12/02/2020 11/18/2020 08/07/2020 03/21/2020 12/26/2019 12/26/2019  PHQ - 2 Score 0 0 0 0 1 1 0  PHQ- 9 Score 0 0 0 - - - 1    Fall Risk Fall Risk  02/09/2021 01/09/2021 12/02/2020 11/18/2020 11/18/2020  Falls in the past year? 1 1 0 - 1  Number falls in past yr: 0 0 0 0 0  Injury with Fall? 0 0 0 1 0  Comment - - - right knee , no major injury -  Risk for fall due to : Orthopedic patient - No Fall Risks - -  Follow up - - Falls evaluation completed - -    FALL RISK PREVENTION PERTAINING TO THE HOME:  Any stairs in or around the home? No  If so, are there any without handrails? No  Home free of loose throw rugs in walkways, pet beds, electrical cords, etc? Yes  Adequate lighting in your home to reduce risk of falls? Yes   ASSISTIVE DEVICES UTILIZED TO PREVENT FALLS:  Life alert? No  Use of a cane, walker or w/c? Yes  Grab bars in the bathroom? Yes  Shower chair or bench in shower? No  Elevated toilet seat or a handicapped toilet? No   TIMED UP AND GO:  Was the test performed? No .  Length of time to  ambulate 10 feet:  sec.     Cognitive Function:     6CIT Screen 02/09/2021 12/24/2019 12/18/2018 01/05/2018  What Year? 0 points 0 points 0 points 0 points  What month? 0  points 0 points 0 points 0 points  What time? 0 points 0 points 0 points 0 points  Count back from 20 0 points 0 points 0 points 0 points  Months in reverse 0 points 0 points 0 points 0 points  Repeat phrase 0 points 0 points 0 points 6 points  Total Score 0 0 0 6    Immunizations Immunization History  Administered Date(s) Administered   Fluad Quad(high Dose 65+) 11/18/2020   Influenza Whole 12/03/2008, 10/19/2010   Influenza,inj,Quad PF,6+ Mos 12/27/2012, 11/01/2013, 10/25/2014, 11/10/2015, 11/24/2016, 12/08/2017, 12/13/2018, 09/25/2019   PNEUMOCOCCAL CONJUGATE-20 11/18/2020   Pneumococcal Conjugate-13 04/11/2014   Pneumococcal Polysaccharide-23 03/05/2013   Td 12/03/2008   Tdap 12/13/2018   Zoster Recombinat (Shingrix) 07/21/2017, 01/05/2018    TDAP status: Up to date  Flu Vaccine status: Up to date  Pneumococcal vaccine status: Up to date  Covid-19 vaccine status: Completed vaccines  Qualifies for Shingles Vaccine? Yes   Zostavax completed No   Shingrix Completed?: Yes  Screening Tests Health Maintenance  Topic Date Due   HEMOGLOBIN A1C  05/12/2021   OPHTHALMOLOGY EXAM  11/06/2021   FOOT EXAM  01/09/2022   MAMMOGRAM  07/17/2022   COLONOSCOPY (Pts 45-47yr Insurance coverage will need to be confirmed)  12/20/2022   PAP SMEAR-Modifier  08/08/2023   TETANUS/TDAP  12/12/2028   Pneumonia Vaccine 66 Years old  Completed   INFLUENZA VACCINE  Completed   DEXA SCAN  Completed   Hepatitis C Screening  Completed   HIV Screening  Completed   Zoster Vaccines- Shingrix  Completed   HPV VACCINES  Aged Out   COVID-19 Vaccine  Discontinued    Health Maintenance  There are no preventive care reminders to display for this patient.  Colorectal cancer screening: Type of screening: Colonoscopy. Completed yes. Repeat every 10 years  Mammogram status: Completed yes. Repeat every year  Bone Density status: Completed 2022. Results reflect: Bone density results: OSTEOPENIA.  Repeat every 2 years.  Lung Cancer Screening: (Low Dose CT Chest recommended if Age 66-80years, 30 pack-year currently smoking OR have quit w/in 15years.) does not qualify.   Lung Cancer Screening Referral: na  Additional Screening:  Hepatitis C Screening: does qualify; Completed yes  Vision Screening: Recommended annual ophthalmology exams for early detection of glaucoma and other disorders of the eye. Is the patient up to date with their annual eye exam?  Yes  Who is the provider or what is the name of the office in which the patient attends annual eye exams? [facility]  If pt is not established with a provider, would they like to be referred to a provider to establish care? No .   Dental Screening: Recommended annual dental exams for proper oral hygiene  Community Resource Referral / Chronic Care Management: CRR required this visit?  No   CCM required this visit?  No      Plan:     I have personally reviewed and noted the following in the patients chart:   Medical and social history Use of alcohol, tobacco or illicit drugs  Current medications and supplements including opioid prescriptions.  Functional ability and status Nutritional status Physical activity Advanced directives List of other physicians Hospitalizations, surgeries, and ER visits in previous 12 months Vitals Screenings to include cognitive,  depression, and falls Referrals and appointments  In addition, I have reviewed and discussed with patient certain preventive protocols, quality metrics, and best practice recommendations. A written personalized care plan for preventive services as well as general preventive health recommendations were provided to patient.     Kate Sable, LPN, LPN   07/08/8305   Nurse Notes:  Ms. Renner , Thank you for taking time to come for your Medicare Wellness Visit. I appreciate your ongoing commitment to your health goals. Please review the following plan we discussed and let  me know if I can assist you in the future.   These are the goals we discussed:  Goals      Increase physical activity     Medication Management     Patient Goals/Self-Care Activities Patient will:  Take medications as prescribed Check blood sugar once a day at the following times: fasting (at least 8 hours since last food consumption) and whenever patient experiences symptoms of hypo/hyperglycemia, document, and provide at future appointments Check blood pressure at least once daily, document, and provide at future appointments Target a minimum of 150 minutes of moderate intensity exercise weekly Engage in dietary modifications by fewer sweetened foods & beverages      Weight (lb) < 200 lb (90.7 kg)        This is a list of the screening recommended for you and due dates:  Health Maintenance  Topic Date Due   Hemoglobin A1C  05/12/2021   Eye exam for diabetics  11/06/2021   Complete foot exam   01/09/2022   Mammogram  07/17/2022   Colon Cancer Screening  12/20/2022   Pap Smear  08/08/2023   Tetanus Vaccine  12/12/2028   Pneumonia Vaccine  Completed   Flu Shot  Completed   DEXA scan (bone density measurement)  Completed   Hepatitis C Screening: USPSTF Recommendation to screen - Ages 80-79 yo.  Completed   HIV Screening  Completed   Zoster (Shingles) Vaccine  Completed   HPV Vaccine  Aged Out   COVID-19 Vaccine  Discontinued

## 2021-02-10 ENCOUNTER — Ambulatory Visit
Admission: RE | Admit: 2021-02-10 | Discharge: 2021-02-10 | Disposition: A | Discharge status: Home / Self Care | Payer: Medicare Other | Source: Ambulatory Visit | Attending: Anesthesiology | Admitting: Anesthesiology

## 2021-02-10 ENCOUNTER — Other Ambulatory Visit: Payer: Self-pay | Admitting: Anesthesiology

## 2021-02-10 DIAGNOSIS — R609 Edema, unspecified: Secondary | ICD-10-CM

## 2021-02-10 DIAGNOSIS — R52 Pain, unspecified: Secondary | ICD-10-CM

## 2021-02-10 DIAGNOSIS — M25572 Pain in left ankle and joints of left foot: Secondary | ICD-10-CM | POA: Diagnosis not present

## 2021-02-10 DIAGNOSIS — M47816 Spondylosis without myelopathy or radiculopathy, lumbar region: Secondary | ICD-10-CM | POA: Diagnosis not present

## 2021-02-10 DIAGNOSIS — M47812 Spondylosis without myelopathy or radiculopathy, cervical region: Secondary | ICD-10-CM | POA: Diagnosis not present

## 2021-02-10 DIAGNOSIS — G894 Chronic pain syndrome: Secondary | ICD-10-CM | POA: Diagnosis not present

## 2021-02-24 DIAGNOSIS — E1169 Type 2 diabetes mellitus with other specified complication: Secondary | ICD-10-CM

## 2021-02-24 DIAGNOSIS — Z7984 Long term (current) use of oral hypoglycemic drugs: Secondary | ICD-10-CM

## 2021-02-24 DIAGNOSIS — I10 Essential (primary) hypertension: Secondary | ICD-10-CM

## 2021-02-24 DIAGNOSIS — E785 Hyperlipidemia, unspecified: Secondary | ICD-10-CM | POA: Diagnosis not present

## 2021-02-26 ENCOUNTER — Telehealth: Payer: Self-pay | Admitting: Family Medicine

## 2021-02-26 NOTE — Telephone Encounter (Signed)
Pt is calling in -she states that her Pain Dr is wanting her to take  Alpina-Lipoic-acid 300 mg  Pt wants to know if this is ok with Dr Moshe Cipro. Please call the pt back

## 2021-03-13 DIAGNOSIS — E1165 Type 2 diabetes mellitus with hyperglycemia: Secondary | ICD-10-CM | POA: Diagnosis not present

## 2021-03-14 LAB — BMP8+EGFR
BUN/Creatinine Ratio: 15 (ref 12–28)
BUN: 14 mg/dL (ref 8–27)
CO2: 26 mmol/L (ref 20–29)
Calcium: 9.9 mg/dL (ref 8.7–10.3)
Chloride: 100 mmol/L (ref 96–106)
Creatinine, Ser: 0.92 mg/dL (ref 0.57–1.00)
Glucose: 144 mg/dL — ABNORMAL HIGH (ref 70–99)
Potassium: 4.8 mmol/L (ref 3.5–5.2)
Sodium: 140 mmol/L (ref 134–144)
eGFR: 69 mL/min/{1.73_m2} (ref >59)

## 2021-03-14 LAB — HEMOGLOBIN A1C
Est. average glucose Bld gHb Est-mCnc: 177 mg/dL
Hgb A1c MFr Bld: 7.8 % — ABNORMAL HIGH (ref 4.8–5.6)

## 2021-03-19 ENCOUNTER — Ambulatory Visit (INDEPENDENT_AMBULATORY_CARE_PROVIDER_SITE_OTHER): Payer: Medicare Other | Admitting: Family Medicine

## 2021-03-19 ENCOUNTER — Other Ambulatory Visit: Payer: Self-pay

## 2021-03-19 ENCOUNTER — Ambulatory Visit (INDEPENDENT_AMBULATORY_CARE_PROVIDER_SITE_OTHER): Payer: Medicare Other | Admitting: Pharmacist

## 2021-03-19 ENCOUNTER — Encounter: Payer: Self-pay | Admitting: Family Medicine

## 2021-03-19 VITALS — BP 115/75 | HR 90 | Ht 68.0 in | Wt 293.0 lb

## 2021-03-19 DIAGNOSIS — Z1231 Encounter for screening mammogram for malignant neoplasm of breast: Secondary | ICD-10-CM

## 2021-03-19 DIAGNOSIS — F321 Major depressive disorder, single episode, moderate: Secondary | ICD-10-CM | POA: Diagnosis not present

## 2021-03-19 DIAGNOSIS — E1165 Type 2 diabetes mellitus with hyperglycemia: Secondary | ICD-10-CM

## 2021-03-19 DIAGNOSIS — E785 Hyperlipidemia, unspecified: Secondary | ICD-10-CM

## 2021-03-19 DIAGNOSIS — I10 Essential (primary) hypertension: Secondary | ICD-10-CM | POA: Diagnosis not present

## 2021-03-19 DIAGNOSIS — E039 Hypothyroidism, unspecified: Secondary | ICD-10-CM

## 2021-03-19 MED ORDER — OZEMPIC (0.25 OR 0.5 MG/DOSE) 2 MG/1.5ML ~~LOC~~ SOPN: 0.5000 mg | PEN_INJECTOR | SUBCUTANEOUS | 2 refills | Status: DC

## 2021-03-19 MED ORDER — OZEMPIC (0.25 OR 0.5 MG/DOSE) 2 MG/1.5ML ~~LOC~~ SOPN: PEN_INJECTOR | SUBCUTANEOUS | 0 refills | Status: DC

## 2021-03-19 MED ORDER — VENLAFAXINE HCL ER 37.5 MG PO CP24: 37.5000 mg | ORAL_CAPSULE | Freq: Every day | ORAL | 3 refills | Status: DC

## 2021-03-19 MED ORDER — GLIPIZIDE ER 10 MG PO TB24: 10.0000 mg | ORAL_TABLET | Freq: Every day | ORAL | 1 refills | Status: DC

## 2021-03-19 NOTE — Patient Instructions (Addendum)
F/U in  8 to 9 weeks, call if you need me sooner  Please sched mammogram at breast center as discussed  New for depression is effexor and you are referred to therapist  Change in diabetes management is recommended as we discussed You are in my prayers  It is important that you exercise regularly at least 30 minutes 5 times a week. If you develop chest pain, have severe difficulty breathing, or feel very tired, stop exercising immediately and seek medical attention  Thanks for choosing [facility], we consider it a privelige to serve you.

## 2021-03-19 NOTE — Chronic Care Management (AMB) (Signed)
Chronic Care Management Pharmacy Note  03/19/2021 Name:  [name] MRN:  [id number] DOB:  01-29-1955  Summary: Type 2 Diabetes Uncontrolled; Most recent A1c increased to 7.8% which remains above goal of <7% per ADA guidelines Current medications: metformin 1,000 mg by mouth twice daily and glipizide XL 20 mg by mouth once daily Continue metformin 1,000 mg by mouth twice daily  Reduce glipizide XL to 10 mg by mouth daily Discussed management with primary care provider and will plan to add a GLP-1 receptor agonist given reduction in cardiovascular disease, low risk of hypoglycemia, increased satiety, and weight loss. Patient denies a personal or family history of medullary thyroid carcinoma (MTC) or Multiple Endocrine Neoplasia syndrome type 2 (MEN 2). Patient also denies a personal history of pancreatitis or biliary disease. Patient reports family history of pancreatitic cancer (mother) and pancreatitis secondary to alcohol intake (daughter). Add Ozempic 0.25 mg subcutaneously once weekly x4 weeks then increase to 0.5 mg subcutaneously once weekly therafter. Can continue to titrate up to 2 mg weekly for blood glucose and weight control as tolerated by patient. Follow-up in 2 months to reassess blood glucose control.  Patient was shown appropriate administration technique for Ozempic with demo device today in office Discussed the need to decrease carbohydrate consumption. Specifically to reduce/eliminate sweetened beverages.   Obesity Recommend diet modification to induce energy deficit of 500 kcal/day or greater Encouraged regular aerobic exercise with a goal of 30 minutes five times per week (150 minutes per week) Discussed management with primary care provider and will plan to add a GLP-1 receptor agonist as above. Follow-up in 2 months to reassess weight control.   Subjective: [name] is an 65 y.o. year old female who is a primary patient of Moshe Cipro, [place] Levo, MD.  The CCM  team was consulted for assistance with disease management and care coordination needs.    Engaged with patient face to face for follow up visit in response to provider referral for pharmacy case management and/or care coordination services.   Consent to Services:  The patient was given information about Chronic Care Management services, agreed to services, and gave verbal consent prior to initiation of services.  Please see initial visit note for detailed documentation.   Patient Care Team: Fayrene Helper, MD as PCP - General Beryle Lathe, [facility] (Pharmacist)  Objective:  Lab Results  Component Value Date   CREATININE 0.92 03/13/2021   CREATININE 0.90 11/11/2020   CREATININE 1.03 (H) 05/02/2020    Lab Results  Component Value Date   HGBA1C 7.8 (H) 03/13/2021   Last diabetic Eye exam:  Lab Results  Component Value Date/Time   HMDIABEYEEXA No Retinopathy 11/06/2020 12:00 AM    Last diabetic Foot exam: No results found for: HMDIABFOOTEX      Component Value Date/Time   CHOL 128 11/11/2020 0922   TRIG 145 11/11/2020 0922   HDL 49 11/11/2020 0922   CHOLHDL 2.6 11/11/2020 0922   CHOLHDL 2.6 08/08/2019 1100   VLDL 22 03/19/2016 0933   LDLCALC 54 11/11/2020 0922   LDLCALC 66 08/08/2019 1100    Hepatic Function Latest Ref Rng & Units 11/11/2020 05/02/2020 08/08/2019  Total Protein 6.0 - 8.5 g/dL 6.6 7.1 7.0  Albumin 3.8 - 4.8 g/dL 4.1 4.3 -  AST 0 - 40 IU/L '24 27 23  ' ALT 0 - 32 IU/L '17 20 18  ' Alk Phosphatase 44 - 121 IU/L 82 99 -  Total Bilirubin 0.0 - 1.2 mg/dL  0.5 0.5 0.3  Bilirubin, Direct 0.0 - 0.2 mg/dL - - 0.1    Lab Results  Component Value Date/Time   TSH 0.763 11/11/2020 09:22 AM   TSH 1.240 05/02/2020 08:16 AM    CBC Latest Ref Rng & Units 11/11/2020 12/07/2018 04/07/2018  WBC 3.4 - 10.8 x10E3/uL 9.0 9.5 6.9  Hemoglobin 11.1 - 15.9 g/dL 13.5 13.7 12.5  Hematocrit 34.0 - 46.6 % 40.4 41.3 37.5  Platelets 150 - 450 x10E3/uL 336 403(H) 411(H)     Lab Results  Component Value Date/Time   VD25OH 34.8 05/02/2020 08:16 AM   VD25OH 31 12/07/2018 09:05 AM    Clinical ASCVD: No  The ASCVD Risk score (Arnett DK, et al., 2019) failed to calculate for the following reasons:   The valid total cholesterol range is 130 to 320 mg/dL    Social History   Tobacco Use  Smoking Status Never  Smokeless Tobacco Never   BP Readings from Last 3 Encounters:  03/19/21 115/75  01/09/21 107/75  11/18/20 130/78   Pulse Readings from Last 3 Encounters:  03/19/21 90  01/09/21 84  11/18/20 88   Wt Readings from Last 3 Encounters:  03/19/21 293 lb (132.9 kg)  01/09/21 299 lb 0.6 oz (135.6 kg)  11/18/20 297 lb (134.7 kg)    Assessment: Review of patient past medical history, allergies, medications, health status, including review of consultants reports, laboratory and other test data, was performed as part of comprehensive evaluation and provision of chronic care management services.   SDOH:  (Social Determinants of Health) assessments and interventions performed:    CCM Care Plan  Allergies  Allergen Reactions   Naproxen Sodium Hives and Swelling    MD thought it was the pink dye in the naproxen   Tsh [Thyrotropin]    Statins Other (See Comments)    Reports increased appetite and possible muscle ache; tolerating rosuvastatin 40 mg daily    Medications Reviewed Today     Reviewed by Beryle Lathe, [facility] (Pharmacist) on 03/19/21 at [phone]  Med List Status: <None>   Medication Order Taking? Sig Documenting Provider Last Dose Status Informant  Alpha Lipoic Acid 200 MG CAPS [id number] Yes Take by mouth. Once a day [provider] Taking Active Self  aspirin 81 MG tablet [id number] Yes Take 81 mg by mouth daily. [provider] Taking Active Self  benazepril (LOTENSIN) 5 MG tablet [id number] Yes TAKE TWO (2) TABLETS BY MOUTH EVERY DAY Fayrene Helper, MD Taking Active   blood glucose meter kit and supplies  [id number] Yes Dispense based on patient and insurance preference. Once daily testing dx e11.9 Fayrene Helper, MD Taking Active   calcium-vitamin D (OSCAL WITH D) 500-200 MG-UNIT per tablet [id number] Yes Take 1 tablet by mouth daily. [provider] Taking Active Self  cyclobenzaprine (FLEXERIL) 5 MG tablet [id number] Yes TAKE ONE TABLET BY MOUTH AT BEDTIME AS NEEDED. SHOULD LAST SIX MONTHS. Fayrene Helper, MD Taking Active            Med Note Moshe Cipro, MARGARET E   Mon Apr 25, 2017  8:52 AM) Dewaine Conger on avg 2 to 3 times per week  Dexlansoprazole 30 MG capsule [id number] Yes Take 30 mg by mouth daily. [provider] Taking Active            Med Note Jim Like Mar 19, 2021  9:43 AM)    Asencion Islam 50 MCG/ACT nasal spray [id number] Yes Place 2 sprays  into both nostrils daily. Fayrene Helper, MD Taking Active            Med Note Waldo Laine, Gwenyth Allegra   Tue Feb 03, 2021  4:00 PM) Seasonal for allergies  furosemide (LASIX) 20 MG tablet [id number] Yes TAKE ONE TABLET BY MOUTH ONCE DAILY FOR LEG SWELLING. Fayrene Helper, MD Taking Active            Med Note Renee Rival   Tue Nov 18, 2020  8:06 AM) Pt takes med as needed.  gabapentin (NEURONTIN) 300 MG capsule [id number] Yes TAKE ONE CAPSULE BY MOUTH TWICE DAILY Fayrene Helper, MD Taking Active   glipiZIDE (GLIPIZIDE XL) 10 MG 24 hr tablet [id number] Yes Take 1 tablet (10 mg total) by mouth daily with breakfast. Fayrene Helper, MD Taking Active   glucose blood [facility] VERIO) test strip [id number]  USE TO TEST BLOOD SUGAR EVERY DAY Renee Rival, FNP  Active   ibuprofen (ADVIL) 800 MG tablet [id number] Yes Take 1 tablet (800 mg total) by mouth every 8 (eight) hours as needed. Fayrene Helper, MD Taking Active   Influenza (>/= 3 years) inactive virus vaccine (FLVIRIN/FLUZONE) injection SUSP 0.5 mL [id number]   Fayrene Helper, MD  Active   levothyroxine (SYNTHROID) 150 MCG tablet  [id number] Yes TAKE ONE TABLET BY MOUTH DAILY. Fayrene Helper, MD Taking Active   metFORMIN (GLUCOPHAGE) 1000 MG tablet [id number] Yes TAKE ONE TABLET BY MOUTH TWICE DAILY WITH A MEAL Fayrene Helper, MD Taking Active   methylPREDNISolone acetate (DEPO-MEDROL) injection 120 mg [id number]   Fayrene Helper, MD  Active   montelukast (SINGULAIR) 10 MG tablet [id number] Yes TAKE ONE TABLET BY MOUTH AT BEDTIME Fayrene Helper, MD Taking Active   [facility] Lancets 18H MISC [id number] Yes Once daily testing dx e11.9 Fayrene Helper, MD Taking Active   oxyCODONE-acetaminophen (PERCOCET) 7.5-325 MG tablet [id number] Yes Take one tablet once daily , as needed , for uncontrolled back pain Fayrene Helper, MD Taking Active   potassium chloride SA (KLOR-CON) 20 MEQ tablet [id number] Yes TAKE ONE TABLET BY MOUTH EVERY DAY AS NEEDED ON DAYS YOU TAKE FUROSEMIDE FOR FOR SWELLING Fayrene Helper, MD Taking Active   rosuvastatin (CRESTOR) 40 MG tablet [id number] Yes Take 1 tablet (40 mg total) by mouth daily. Renee Rival, FNP Taking Active   Semaglutide,0.25 or 0.5MG/DOS, (OZEMPIC, 0.25 OR 0.5 MG/DOSE,) 2 MG/1.5ML SOPN [id number] Yes Inject 0.25 mg into the skin once a week for 28 days, THEN 0.5 mg once a week for 14 days. Fayrene Helper, MD  Active   Semaglutide,0.25 or 0.5MG/DOS, (OZEMPIC, 0.25 OR 0.5 MG/DOSE,) 2 MG/1.5ML SOPN [id number] Yes Inject 0.5 mg into the skin once a week. Fayrene Helper, MD  Active   traZODone (DESYREL) 100 MG tablet [id number] Yes TAKE ONE TABLET BY MOUTH AT BEDTIME Fayrene Helper, MD Taking Active   Turmeric 500 MG CAPS [id number] Yes Take 1 capsule by mouth daily. [provider] Taking Active Self  venlafaxine XR (EFFEXOR XR) 37.5 MG 24 hr capsule [id number] Yes Take 1 capsule (37.5 mg total) by mouth daily with breakfast. Fayrene Helper, MD Taking Active             Patient Active Problem List   Diagnosis Date Noted    Depression, major, single episode, moderate ([place]) 03/19/2021   Right knee pain 05/07/2020   Strain of muscle, fascia and tendon of lower back,  initial encounter 03/21/2020   Statin intolerance 07/24/2017   Chronic pain 11/24/2016   Snoring 07/20/2016   Carotid bruit present 11/10/2015   Right hip pain 10/25/2014   Annual physical exam 16/10/9602   Metabolic syndrome X 54/09/8117   Cervical neck pain with evidence of disc disease 10/19/2010   Type 2 diabetes mellitus with hyperglycemia ([place]) 01/22/2010   Hypothyroidism 06/08/2007   Hyperlipidemia 06/08/2007   Morbid obesity ([place]) 06/08/2007   Depression with anxiety 06/08/2007   Hypertension goal BP (blood pressure) < 130/80 06/08/2007   GERD 06/08/2007   Back pain with radiculopathy 06/08/2007    Immunization History  Administered Date(s) Administered   Fluad Quad(high Dose 65+) 11/18/2020   Influenza Whole 12/03/2008, 10/19/2010   Influenza,inj,Quad PF,6+ Mos 12/27/2012, 11/01/2013, 10/25/2014, 11/10/2015, 11/24/2016, 12/08/2017, 12/13/2018, 09/25/2019   PNEUMOCOCCAL CONJUGATE-20 11/18/2020   Pneumococcal Conjugate-13 04/11/2014   Pneumococcal Polysaccharide-23 03/05/2013   Td 12/03/2008   Tdap 12/13/2018   Zoster Recombinat (Shingrix) 07/21/2017, 01/05/2018    Conditions to be addressed/monitored: HTN, HLD, DMII, and obesity  Care Plan : Medication Management  Updates made by Beryle Lathe, [place] since 03/19/2021 12:00 AM     Problem: T2DM, HTN, HLD, Obesity   Priority: High  Onset Date: 02/03/2021     Long-Range Goal: Disease Progression Prevention   Start Date: 02/03/2021  Expected End Date: 05/04/2021  Recent Progress: On track  Priority: High  Note:   Current Barriers:  Unable to achieve control of diabetes and weight management  Pharmacist Clinical Goal(s):  Through collaboration with PharmD and provider, patient will  Achieve control of diabetes and weight management as evidenced by improved fasting  blood sugar, improved A1c, and continued weight loss   Interventions: 1:1 collaboration with Fayrene Helper, MD regarding development and update of comprehensive plan of care as evidenced by provider attestation and co-signature Inter-disciplinary care team collaboration (see longitudinal plan of care) Comprehensive medication review performed; medication list updated in electronic medical record  Type 2 Diabetes - Goal on track: NO.: Uncontrolled; Most recent A1c increased to 7.8% which remains above goal of <7% per ADA guidelines Current medications: metformin 1,000 mg by mouth twice daily and glipizide XL 20 mg by mouth once daily Intolerances: none Taking medications as directed: yes Side effects thought to be attributed to current medication regimen: no Denies hypoglycemic symptoms (sweaty and shaky). Hypoglycemia prevention: not indicated at this time Current meal patterns: not discussed today Current exercise:  stationary bike daily On a statin: yes On aspirin 81 mg daily: yes Last microalbumin/creatinine ratio: <6 (08/07/20); on an ACEi/ARB: yes Last eye exam: completed within last year Last foot exam: completed within last year Pneumonia vaccine: series complete Influenza vaccine: up to date Shingrix: series complete Current glucose readings: not discussed today Instructed to monitor blood sugars once a day at the following times: fasting (at least 8 hours since last food consumption) and whenever patient experiences symptoms of hypo/hyperglycemia  Encouraged regular aerobic exercise with a goal of 30 minutes five times per week (150 minutes per week) Continue metformin 1,000 mg by mouth twice daily  Reduce glipizide XL to 10 mg by mouth daily Discussed management with primary care provider and will plan to add a GLP-1 receptor agonist given reduction in cardiovascular disease, low risk of hypoglycemia, increased satiety, and weight loss. Patient denies a personal or family  history of medullary thyroid carcinoma (MTC) or Multiple Endocrine Neoplasia syndrome type 2 (MEN 2). Patient also denies a personal history of pancreatitis  or biliary disease. Patient reports family history of pancreatitic cancer (mother) and pancreatitis secondary to alcohol intake (daughter). Add Ozempic 0.25 mg subcutaneously once weekly x4 weeks then increase to 0.5 mg subcutaneously once weekly therafter. Can continue to titrate up to 2 mg weekly for blood glucose and weight control as tolerated by patient. Follow-up in 2 months to reassess blood glucose control.  Patient was shown appropriate administration technique for Ozempic with demo device today in office Discussed the need to decrease carbohydrate consumption. Specifically to reduce/eliminate sweetened beverages.   Hypertension - Condition stable. Not addressed this visit.: Blood pressure under good control. Blood pressure is at goal of <130/80 mmHg per 2017 AHA/ACC guidelines. Current medications: benazepril 10 mg by mouth once daily and furosemide 20 mg by mouth every other day as needed for swelling + potassium repletion 20 mEq    Intolerances: none Taking medications as directed: yes Side effects thought to be attributed to current medication regimen: no Current home blood pressure: not discussed today Continue current medications as above Encourage dietary sodium restriction/DASH diet Recommend home blood pressure monitoring to discuss at next visit Discussed need for and importance of continued work on weight loss  Hyperlipidemia - Condition stable. Not addressed this visit.: Controlled. LDL at goal of <70 due to very high risk given diabetes + at least 1 additional major risk factor (hypertension) per 2020 AACE/ACE guidelines. Triglycerides at goal of <150 per 2020 AACE/ACE guidelines. Current medications: rosuvastatin 40 mg by mouth once daily Intolerances: none Taking medications as directed: yes Side effects thought to  be attributed to current medication regimen: no Continue current medications as above Encourage dietary reduction of high fat containing foods such as butter, nuts, bacon, egg yolks, etc. Recommend regular aerobic exercise Discussed need for and importance of continued work on weight loss  Overweight/Obesity - Goal on Track (progressing): YES.: Unable to achieve goal weight loss through lifestyle modification alone Current treatment:  none   Medications previously tried:  none Strategies previously tried: unknown History of bariatric surgery:  unknown Baseline weight: 293 lbs; most recent weight: 293 lbs Recommend that the patient emphasize lean proteins, fruits and vegetables, whole grains and increased fiber consumption, adequate hydration Recommend diet modification to induce energy deficit of 500 kcal/day or greater Encouraged regular aerobic exercise with a goal of 30 minutes five times per week (150 minutes per week) Discussed management with primary care provider and will plan to add a GLP-1 receptor agonist as above. Follow-up in 2 months to reassess weight control.   Patient Goals/Self-Care Activities Patient will:  Take medications as prescribed Check blood sugar once a day at the following times: fasting (at least 8 hours since last food consumption) and whenever patient experiences symptoms of hypo/hyperglycemia, document, and provide at future appointments Check blood pressure at least once daily, document, and provide at future appointments Target a minimum of 150 minutes of moderate intensity exercise weekly Engage in dietary modifications by fewer sweetened foods & beverages  Follow Up Plan: Face to Face appointment with care management team member scheduled for: 05/13/21      Medication Assistance: None required.  Patient affirms current coverage meets needs.  Patient's preferred pharmacy is:  [facility], [address] [place] [address] Phone: [phone] Fax: [place], [address] AT [place]. [place] [address] [place] [address]  Phone: [phone] Fax: [phone]  [id number] - [address], [place] [address] AT [place] [address] Phone: [phone] Fax: [phone]  Follow Up:  Patient agrees to Care Plan and Follow-up.  Plan: Face to Face appointment with care management team member scheduled for: 05/13/21  Kennon Holter, PharmD, [place], [place] Clinical Pharmacist Practitioner [facility] [phone]

## 2021-03-19 NOTE — Patient Instructions (Signed)
[name],  It was great to talk to you today!  Decrease glipizide to 1 tablet (10 mg) by mouth daily. If you blood glucose starts getting low (<80) then discontinue glipizide and let us know Start taking Ozempic as instructed. Rx sent to the pharmacy today  Please call me with any questions or concerns.  Visit Information  Following are the goals we discussed today:   Goals Addressed             This Visit's Progress    Medication Management       Patient Goals/Self-Care Activities Patient will:  Take medications as prescribed Check blood sugar once a day at the following times: fasting (at least 8 hours since last food consumption) and whenever patient experiences symptoms of hypo/hyperglycemia, document, and provide at future appointments Check blood pressure at least once daily, document, and provide at future appointments Target a minimum of 150 minutes of moderate intensity exercise weekly Engage in dietary modifications by fewer sweetened foods & beverages           Follow-up plan: Face to Face appointment with care management team member scheduled for: 05/13/21  Print copy of patient instructions, educational materials, and care plan provided in person.   Please call the care guide team at [phone] if you need to cancel or reschedule your appointment.   Kennon Holter, PharmD, BCACP, CPP Clinical Pharmacist Practitioner [facility] [phone]   Ozempic (semaglutide)   What is this medicine used for: Used to lower your blood sugar and treat your diabetes.  How to take the medicine:  With each use of the pen: Take your pen out of the refrigerator and let it come to room temperature (about 15 minutes). Check the pen window to make sure that Ozempic in your pen is clear and colorless (if the medication looks cloudy, do not use the pen) Pull off pen cap and wipe rubber stopper tip with alcohol swab Twist on a new needle and pull off  outer cap, do not throw this away Pull off inner needle cap and throw it away Before the first time you use a new pen (priming the pen): Turn the dose selector until the dose counter shows the "flow check symbol" Hold pen upright and press the dose button until the dose counter shows 0 Repeat until a drop of the medicine appears at the tip of the needle Using the pen and injecting your dose: Dial your dose using the dose selector Choose your injection site (stomach, thigh, or upper arm) and wipe the skin with an alcohol swab. Let dry before injecting your dose Insert the needle into the skin at your injection site. Make sure you can see the dose counter, do not cover with your hand Press down on the dose button to inject until the dose counter shows 0 and lines up with the dose pointer. You may hear or feel a click Keep the needle in your skin and count slowly for 6 seconds Withdraw the needle from your skin and carefully cap the needle using the outer needle cap. Unscrew the needle from the pen and place the needle in a puncture-resistant container Rotate injection sites weekly  Most common side effects: Nausea/Vomiting Diarrhea Abdominal pain  Storage: How Supplied: Each carton contains 1-2 pens  Each pen contains 2-4 doses Store new, unopened pens in the refrigerator. Do not freeze. Store your used pen at room temperature or in the refrigerator for 56 days. Pens should be  thrown away after 56 days, even if they still have medication remaining.

## 2021-03-22 ENCOUNTER — Encounter: Payer: Self-pay | Admitting: Family Medicine

## 2021-03-22 NOTE — Progress Notes (Signed)
[name]     MRN: [id number]      DOB: 01/28/1955   HPI [name] is here for follow up and re-evaluation of chronic medical conditions, medication management and review of any available recent lab and radiology data.  Preventive health is updated, specifically  Cancer screening and Immunization.   Questions or concerns regarding consultations or procedures which the PT has had in the interim are  addressed. The PT denies any adverse reactions to current medications since the last visit.  C/o anxiety and depression as her son who she is close to has recently ben diagnosed with stage 4 prostate cancer and is to start treatment  States her daughter has chronic uncontrolled disease and she is stressed about losing both children potentially Blood sugar remains uncontrolled and I am encouraging her to change therapy to assist with diabetes and weight loss.Her concern of pancreatitis  is addressed, daughter has had this when she was alcoholic, pt has no h/o pancreatitis or thyroid cancer, reviewd this with clinical pharmacist also ROS Denies recent fever or chills. Denies sinus pressure, nasal congestion, ear pain or sore throat. Denies chest congestion, productive cough or wheezing. Denies chest pains, palpitations and leg swelling Denies abdominal pain, nausea, vomiting,diarrhea or constipation.   Denies dysuria, frequency, hesitancy or incontinence. Denies uncontrolled  joint pain, swelling and limitation in mobility. Denies headaches, seizures, numbness, or tingling. Denies skin break down or rash.   PE  BP 115/75 (BP Location: Left Arm, Patient Position: Sitting, Cuff Size: Large)    Pulse 90    Ht 5\' 8"  (1.727 m)    Wt 293 lb (132.9 kg)    SpO2 96%    BMI 44.55 kg/m   Patient alert and oriented and in no cardiopulmonary distress.  HEENT: No facial asymmetry, EOMI,     Neck supple .  Chest: Clear to auscultation bilaterally.  CVS: S1, S2 no murmurs, no S3.Regular  rate.  ABD: Soft non tender.   Ext: No edema  MS: Adequate ROM spine, shoulders, hips and knees.  Skin: Intact, no ulcerations or rash noted.  Psych: Good eye contact, tearful affect. Memory intact  anxious and  depressed appearing.  CNS: CN 2-12 intact, power,  normal throughout.no focal deficits noted.   Assessment & Plan  Type 2 diabetes mellitus with hyperglycemia ([place]) [name] is reminded of the importance of commitment to daily physical activity for 30 minutes or more, as able and the need to limit carbohydrate intake to 30 to 60 grams per meal to help with blood sugar control.   The need to take medication as prescribed, test blood sugar as directed, and to call between visits if there is a concern that blood sugar is uncontrolled is also discussed.   [name] is reminded of the importance of daily foot exam, annual eye examination, and good blood sugar, blood pressure and cholesterol control. Uncontrolled, start ozempic weekly  Diabetic Labs Latest Ref Rng & Units 03/13/2021 11/11/2020 08/07/2020 05/02/2020 12/25/2019  HbA1c 4.8 - 5.6 % 7.8(H) 7.7(H) - 8.4(H) 7.1(H)  Microalbumin mg/dL - - - - -  Micro/Creat Ratio 0 - 29 mg/g creat - - <6 - -  Chol 100 - 199 mg/dL - 128 - 142 132  HDL >39 mg/dL - 49 - 52 50  Calc LDL 0 - 99 mg/dL - 54 - 64 57  Triglycerides 0 - 149 mg/dL - 145 - 153(H) 145  Creatinine 0.57 - 1.00 mg/dL 0.92 0.90 -  1.03(H) 0.85   BP/Weight 03/19/2021 01/09/2021 11/18/2020 11/17/2020 08/07/2020 05/07/2020 07/14/3557  Systolic BP 741 638 453 646 803 212 248  Diastolic BP 75 75 78 84 74 77 78  Wt. (Lbs) 293 299.04 297 297 300 303 301  BMI 44.55 45.47 45.16 45.16 45.61 46.07 45.77   Foot/eye exam completion dates Latest Ref Rng & Units 01/09/2021 11/06/2020  Eye Exam No Retinopathy - No Retinopathy  Foot Form Completion - Done -        Morbid obesity  Patient re-educated about  the importance of commitment to a  minimum of 150 minutes of exercise  per week as able.  The importance of healthy food choices with portion control discussed, as well as eating regularly and within a 12 hour window most days. The need to choose "clean , green" food 50 to 75% of the time is discussed, as well as to make water the primary drink and set a goal of 64 ounces water daily.    Weight /BMI 03/19/2021 01/09/2021 11/18/2020  WEIGHT 293 lb 299 lb 0.6 oz 297 lb  HEIGHT 5\' 8"  5\' 8"  5\' 8"   BMI 44.55 kg/m2 45.47 kg/m2 45.16 kg/m2      Hypothyroidism Controlled, no change in medication   Hypertension goal BP (blood pressure) < 130/80 Controlled, no change in medication DASH diet and commitment to daily physical activity for a minimum of 30 minutes discussed and encouraged, as a part of hypertension management. The importance of attaining a healthy weight is also discussed.  BP/Weight 03/19/2021 01/09/2021 11/18/2020 11/17/2020 08/07/2020 05/07/2020 2/50/0370  Systolic BP 488 891 694 503 888 280 034  Diastolic BP 75 75 78 84 74 77 78  Wt. (Lbs) 293 299.04 297 297 300 303 301  BMI 44.55 45.47 45.16 45.16 45.61 46.07 45.77       Hyperlipidemia Hyperlipidemia:Low fat diet discussed and encouraged.   Lipid Panel  Lab Results  Component Value Date   CHOL 128 11/11/2020   HDL 49 11/11/2020   LDLCALC 54 11/11/2020   TRIG 145 11/11/2020   CHOLHDL 2.6 11/11/2020  Controlled, no change in medication Updated lab needed at/ before next visit.      Depression, major, single episode, moderate ([place]) Not suicidal or homicidal, start medication and refer to therapy, re eval in 6 to 8 weeks

## 2021-03-22 NOTE — Assessment & Plan Note (Signed)
Not suicidal or homicidal, start medication and refer to therapy, re eval in 6 to 8 weeks

## 2021-03-22 NOTE — Assessment & Plan Note (Signed)
[name] is reminded of the importance of commitment to daily physical activity for 30 minutes or more, as able and the need to limit carbohydrate intake to 30 to 60 grams per meal to help with blood sugar control.   The need to take medication as prescribed, test blood sugar as directed, and to call between visits if there is a concern that blood sugar is uncontrolled is also discussed.   [name] is reminded of the importance of daily foot exam, annual eye examination, and good blood sugar, blood pressure and cholesterol control. Uncontrolled, start ozempic weekly  Diabetic Labs Latest Ref Rng & Units 03/13/2021 11/11/2020 08/07/2020 05/02/2020 12/25/2019  HbA1c 4.8 - 5.6 % 7.8(H) 7.7(H) - 8.4(H) 7.1(H)  Microalbumin mg/dL - - - - -  Micro/Creat Ratio 0 - 29 mg/g creat - - <6 - -  Chol 100 - 199 mg/dL - 128 - 142 132  HDL >39 mg/dL - 49 - 52 50  Calc LDL 0 - 99 mg/dL - 54 - 64 57  Triglycerides 0 - 149 mg/dL - 145 - 153(H) 145  Creatinine 0.57 - 1.00 mg/dL 0.92 0.90 - 1.03(H) 0.85   BP/Weight 03/19/2021 01/09/2021 11/18/2020 11/17/2020 08/07/2020 05/07/2020 06/23/509  Systolic BP 021 117 356 701 410 301 314  Diastolic BP 75 75 78 84 74 77 78  Wt. (Lbs) 293 299.04 297 297 300 303 301  BMI 44.55 45.47 45.16 45.16 45.61 46.07 45.77   Foot/eye exam completion dates Latest Ref Rng & Units 01/09/2021 11/06/2020  Eye Exam No Retinopathy - No Retinopathy  Foot Form Completion - Done -

## 2021-03-22 NOTE — Assessment & Plan Note (Signed)
Controlled, no change in medication DASH diet and commitment to daily physical activity for a minimum of 30 minutes discussed and encouraged, as a part of hypertension management. The importance of attaining a healthy weight is also discussed.  BP/Weight 03/19/2021 01/09/2021 11/18/2020 11/17/2020 08/07/2020 05/07/2020 9/98/0699  Systolic BP 967 227 737 505 107 125 247  Diastolic BP 75 75 78 84 74 77 78  Wt. (Lbs) 293 299.04 297 297 300 303 301  BMI 44.55 45.47 45.16 45.16 45.61 46.07 45.77

## 2021-03-22 NOTE — Assessment & Plan Note (Signed)
°  Patient re-educated about  the importance of commitment to a  minimum of 150 minutes of exercise per week as able.  The importance of healthy food choices with portion control discussed, as well as eating regularly and within a 12 hour window most days. The need to choose "clean , green" food 50 to 75% of the time is discussed, as well as to make water the primary drink and set a goal of 64 ounces water daily.    Weight /BMI 03/19/2021 01/09/2021 11/18/2020  WEIGHT 293 lb 299 lb 0.6 oz 297 lb  HEIGHT 5\' 8"  5\' 8"  5\' 8"   BMI 44.55 kg/m2 45.47 kg/m2 45.16 kg/m2

## 2021-03-22 NOTE — Assessment & Plan Note (Signed)
Hyperlipidemia:Low fat diet discussed and encouraged.   Lipid Panel  Lab Results  Component Value Date   CHOL 128 11/11/2020   HDL 49 11/11/2020   LDLCALC 54 11/11/2020   TRIG 145 11/11/2020   CHOLHDL 2.6 11/11/2020  Controlled, no change in medication Updated lab needed at/ before next visit.

## 2021-03-22 NOTE — Assessment & Plan Note (Signed)
Controlled, no change in medication  

## 2021-03-24 DIAGNOSIS — E785 Hyperlipidemia, unspecified: Secondary | ICD-10-CM | POA: Diagnosis not present

## 2021-03-24 DIAGNOSIS — E1165 Type 2 diabetes mellitus with hyperglycemia: Secondary | ICD-10-CM | POA: Diagnosis not present

## 2021-03-24 DIAGNOSIS — I10 Essential (primary) hypertension: Secondary | ICD-10-CM | POA: Diagnosis not present

## 2021-03-25 ENCOUNTER — Telehealth: Payer: Self-pay | Admitting: *Deleted

## 2021-03-25 ENCOUNTER — Ambulatory Visit (INDEPENDENT_AMBULATORY_CARE_PROVIDER_SITE_OTHER): Payer: Medicare Other | Admitting: *Deleted

## 2021-03-25 DIAGNOSIS — F418 Other specified anxiety disorders: Secondary | ICD-10-CM

## 2021-03-25 DIAGNOSIS — E1165 Type 2 diabetes mellitus with hyperglycemia: Secondary | ICD-10-CM

## 2021-03-25 DIAGNOSIS — I10 Essential (primary) hypertension: Secondary | ICD-10-CM

## 2021-03-25 DIAGNOSIS — F321 Major depressive disorder, single episode, moderate: Secondary | ICD-10-CM

## 2021-03-25 DIAGNOSIS — E785 Hyperlipidemia, unspecified: Secondary | ICD-10-CM

## 2021-03-25 DIAGNOSIS — G894 Chronic pain syndrome: Secondary | ICD-10-CM

## 2021-03-25 DIAGNOSIS — E8881 Metabolic syndrome: Secondary | ICD-10-CM

## 2021-03-25 NOTE — Chronic Care Management (AMB) (Signed)
?  Care Management  ? ?Note ? ?03/25/2021 ?Name: [name] MRN: [id number] DOB: [birth year] ? ?[name] is a 66 y.o. year old female who is a primary care patient of Fayrene Helper, MD and is actively engaged with the care management team. I reached out to [name] by phone today to assist with scheduling an initial visit with the Licensed Clinical Social Worker ? ?Follow up plan: ?Telephone appointment with care management team member scheduled for:03/25/21 ? ?Laverda Sorenson  ?Care Guide, Embedded Care Coordination ?[place]  Care Management  ?Direct Dial: [phone] ? ?

## 2021-03-27 NOTE — Patient Instructions (Signed)
Visit Information  ? ?Thank you for taking time to visit with me today. Please don't hesitate to contact me if I can be of assistance to you before our next scheduled telephone appointment. ? ?Following are the goals we discussed today:  ?Patient Goals/Self-Care Activities: ?Begin personal counseling with LCSW, on a bi-weekly basis, to reduce and manage symptoms of Depression, Anxiety, and Chronic Pain, until established with [facility], or until symptoms are well-controlled. ?Accept all calls from representative with [facility], in an effort to establish ongoing mental health counseling and supportive services. ?Incorporate into daily practice - relaxation techniques, deep breathing exercises and mindfulness meditation strategies. ?Continue with compliance of taking prescription medications, try to obtain adequate rest, stay well-hydrated, and eat a healthy, well-balanced diet. ?Contact LCSW directly (# [phone]), if you have questions, need assistance, or if additional social work needs are identified between now and our next scheduled telephone outreach call. ?Follow-Up Date:  04/07/2021 at 1:45 pm ? ?Please call the care guide team at [phone] if you need to cancel or reschedule your appointment.  ? ?If you are experiencing a Mental Health or [place] or need someone to talk to, please call the Suicide and Crisis Lifeline: 988 ?call the [place] National Suicide Prevention Lifeline: 367-122-4757 or TTY: (640)818-4182 TTY (516)538-0232) to talk to a trained counselor ?call 1-800-273-TALK (toll free, 24 hour hotline) ?go to [facility] [address], [place] [phone]) ?call the [facility]: [phone] ?call 911  ? ?Following is a copy of your full care plan:  ?Care Plan : [place]  ?Updates made by Francis Gaines, LCSW since 03/27/2021 12:00 AM  ?  ? ?Problem: Begin and Stick with Counseling to Manage Symptoms  of Anxiety and Depression.   ?Priority: High  ?  ? ?Goal: Begin and Stick with Counseling to Manage Symptoms of Anxiety and Depression.   ?Start Date: 03/25/2021  ?Expected End Date: 06/25/2021  ?This Visit's Progress: On track  ?Priority: High  ?Note:   ?Current Barriers:   ?Patient with Acute Mental Health Needs related to Depression, Anxiety, and Chronic Pain, requires Support, Education, Resources, Referrals, Advocacy, and Care Coordination, in order to meet Unmet Acute Mental Health Needs. ?Patient lacks knowledge of available community counseling agencies and resources. ?Clinical Goal(s):  ?Patient will work with LCSW to reduce and manage symptoms of Depression, Anxiety, and Chronic Pain, until well-controlled, or established with a community mental health provider.   ?Patient will increase knowledge and/or ability of:  ?      Coping Skills, Healthy Habits, Self-Management Skills, Stress Reduction, Home Safety and Utilizing [employer] and Resources.   ?Interventions:  ?Assessed patient's previous treatment, needs, coping skills, current treatment, support system, and barriers to care. ?Collaboration with Primary Care Provider, Dr. Tula Nakayama regarding development and update of comprehensive plan of care as evidenced by provider attestation and co-signature. ?Inter-disciplinary care team collaboration (see longitudinal plan of care). ?Clinical Interventions: ?PHQ-2 and PHQ-9 Depression Screening Tool Performed, and Results Reviewed with Patient. ?Suicidal Ideation/Homicidal Ideation Assessed - None Present. ?Solution-Focused Therapy Performed. ?Mindfulness Meditation Strategies, Relaxation Techniques, and Deep Breathing Exercises Taught, and Encouraged Daily. ?Active Listening/Reflection Utilized. ?Verbalization of Feelings Encouraged, and Emotional Support Provided. ?Problem Solving/Task-Centered Solutions Developed. ?Brief Cognitive Behavioral Therapy Initiated. ?Mental Health Medications Reviewed,  and Compliance Emphasized.   ?Quality of Sleep Assessed, and Sleep Hygiene Techniques Promoted.      ?Discussed plans with patient for ongoing care management follow-up, and  provided patient with direct contact information for care management team. ?Discussed several options for long-term counseling based on need and insurance through [facility], and verbal consent obtained to submit referral to [facility], for ongoing mental health counseling and supportive services.  ?Referral submitted to [facility], via [facility], for long-term mental health counseling and supportive services. ?Patient Goals/Self-Care Activities: ?Begin personal counseling with LCSW, on a bi-weekly basis, to reduce and manage symptoms of Depression, Anxiety, and Chronic Pain, until established with [facility], or until symptoms are well-controlled. ?Accept all calls from representative with [facility], in an effort to establish ongoing mental health counseling and supportive services. ?Incorporate into daily practice - relaxation techniques, deep breathing exercises and mindfulness meditation strategies. ?Continue with compliance of taking prescription medications, try to obtain adequate rest, stay well-hydrated, and eat a healthy, well-balanced diet. ?Contact LCSW directly (# [phone]), if you have questions, need assistance, or if additional social work needs are identified between now and our next scheduled telephone outreach call. ?Follow-Up Date:  04/07/2021 at 1:45 pm ? ?  ? ? ?Consent to CCM Services: ?Ms. Juncaj was given information about Chronic Care Management services including:  ?CCM service includes personalized support from designated clinical staff supervised by her physician, including individualized plan of care and coordination with other care providers ?24/7 contact phone numbers for assistance for urgent and routine care needs. ?Service will only be billed when office clinical  staff spend 20 minutes or more in a month to coordinate care. ?Only one practitioner may furnish and bill the service in a calendar month. ?The patient may stop CCM services at any time (effective at the end of the month) by phone call to the office staff. ?The patient will be responsible for cost sharing (co-pay) of up to 20% of the service fee (after annual deductible is met). ? ?Patient agreed to services and verbal consent obtained.  ? ?Patient verbalizes understanding of instructions and care plan provided today and agrees to view in [place]. Active MyChart status confirmed with patient.   ? ?Telephone follow up appointment with care management team member scheduled for:  04/07/2021 at 1:45 pm. ? ?Nat Christen, BSW, MSW, LCSW  ?Licensed Clinical Social Worker  ?[facility]  ?Mailing Address-[address] [place], [place] [address] ?Physical Address-[address], [place], [place] [address] ?Toll Free Main # [phone] ?Fax # [phone] ?Cell # [phone] ?[email or web address] ? ? ? ? ? ?  ? ? ?  ?

## 2021-03-27 NOTE — Chronic Care Management (AMB) (Signed)
Chronic Care Management    Clinical Social Work Note  03/27/2021 Name: [name] MRN: [id number] DOB: 1955/12/02  [name] is a 66 y.o. year old female who is a primary care patient of Moshe Cipro, Norwood Levo, MD. The CCM team was consulted to assist the patient with chronic disease management and/or care coordination needs related to: [employer] and [place] and Resources.   Engaged with patient by telephone for initial visit in response to provider referral for social work chronic care management and care coordination services.   Consent to Services:  The patient was given information about Chronic Care Management services, agreed to services, and gave verbal consent prior to initiation of services.  Please see initial visit note for detailed documentation.   Patient agreed to services and consent obtained.   Assessment: Review of patient past medical history, allergies, medications, and health status, including review of relevant consultants reports was performed today as part of a comprehensive evaluation and provision of chronic care management and care coordination services.     SDOH (Social Determinants of Health) assessments and interventions performed:  SDOH Interventions    Flowsheet Row Most Recent Value  SDOH Interventions   Food Insecurity Interventions Intervention Not Indicated  Financial Strain Interventions Intervention Not Indicated  Housing Interventions Intervention Not Indicated  Intimate Partner Violence Interventions Intervention Not Indicated  Physical Activity Interventions Intervention Not Indicated  Stress Interventions Offered [employer] Resources, Provide Counseling, Other (Comment)  [Referral to Psychiatrist and Therapist]  Social Connections Interventions Intervention Not Indicated  Transportation Interventions Intervention Not Indicated  Depression Interventions/Treatment  Referral to Psychiatry, Medication,  Counseling        Advanced Directives Status: See Care Plan for related entries.  CCM Care Plan  Allergies  Allergen Reactions   Naproxen Sodium Hives and Swelling    MD thought it was the pink dye in the naproxen   Tsh [Thyrotropin]    Statins Other (See Comments)    Reports increased appetite and possible muscle ache; tolerating rosuvastatin 40 mg daily    Outpatient Encounter Medications as of 03/25/2021  Medication Sig Note   Alpha Lipoic Acid 200 MG CAPS Take by mouth. Once a day    aspirin 81 MG tablet Take 81 mg by mouth daily.    benazepril (LOTENSIN) 5 MG tablet TAKE TWO (2) TABLETS BY MOUTH EVERY DAY    blood glucose meter kit and supplies Dispense based on patient and insurance preference. Once daily testing dx e11.9    calcium-vitamin D (OSCAL WITH D) 500-200 MG-UNIT per tablet Take 1 tablet by mouth daily.    cyclobenzaprine (FLEXERIL) 5 MG tablet TAKE ONE TABLET BY MOUTH AT BEDTIME AS NEEDED. SHOULD LAST SIX MONTHS. 04/25/2017: Takes on avg 2 to 3 times per week   Dexlansoprazole 30 MG capsule Take 30 mg by mouth daily.    FLONASE 50 MCG/ACT nasal spray Place 2 sprays into both nostrils daily. 02/03/2021: Seasonal for allergies   furosemide (LASIX) 20 MG tablet TAKE ONE TABLET BY MOUTH ONCE DAILY FOR LEG SWELLING. 11/18/2020: Pt takes med as needed.   gabapentin (NEURONTIN) 300 MG capsule TAKE ONE CAPSULE BY MOUTH TWICE DAILY    glipiZIDE (GLIPIZIDE XL) 10 MG 24 hr tablet Take 1 tablet (10 mg total) by mouth daily with breakfast.    glucose blood (ONETOUCH VERIO) test strip USE TO TEST BLOOD SUGAR EVERY DAY    ibuprofen (ADVIL) 800 MG tablet Take 1 tablet (800 mg total) by  mouth every 8 (eight) hours as needed.    levothyroxine (SYNTHROID) 150 MCG tablet TAKE ONE TABLET BY MOUTH DAILY.    metFORMIN (GLUCOPHAGE) 1000 MG tablet TAKE ONE TABLET BY MOUTH TWICE DAILY WITH A MEAL    montelukast (SINGULAIR) 10 MG tablet TAKE ONE TABLET BY MOUTH AT BEDTIME    OneTouch Delica  Lancets 80D MISC Once daily testing dx e11.9    oxyCODONE-acetaminophen (PERCOCET) 7.5-325 MG tablet Take one tablet once daily , as needed , for uncontrolled back pain    potassium chloride SA (KLOR-CON) 20 MEQ tablet TAKE ONE TABLET BY MOUTH EVERY DAY AS NEEDED ON DAYS YOU TAKE FUROSEMIDE FOR FOR SWELLING    rosuvastatin (CRESTOR) 40 MG tablet Take 1 tablet (40 mg total) by mouth daily.    Semaglutide,0.25 or 0.5MG/DOS, (OZEMPIC, 0.25 OR 0.5 MG/DOSE,) 2 MG/1.5ML SOPN Inject 0.25 mg into the skin once a week for 28 days, THEN 0.5 mg once a week for 14 days.    [START ON 04/30/2021] Semaglutide,0.25 or 0.5MG/DOS, (OZEMPIC, 0.25 OR 0.5 MG/DOSE,) 2 MG/1.5ML SOPN Inject 0.5 mg into the skin once a week.    traZODone (DESYREL) 100 MG tablet TAKE ONE TABLET BY MOUTH AT BEDTIME    Turmeric 500 MG CAPS Take 1 capsule by mouth daily.    venlafaxine XR (EFFEXOR XR) 37.5 MG 24 hr capsule Take 1 capsule (37.5 mg total) by mouth daily with breakfast.    Facility-Administered Encounter Medications as of 03/25/2021  Medication   Influenza (>/= 3 years) inactive virus vaccine (FLVIRIN/FLUZONE) injection SUSP 0.5 mL   methylPREDNISolone acetate (DEPO-MEDROL) injection 120 mg    Patient Active Problem List   Diagnosis Date Noted   Depression, major, single episode, moderate ([place]) 03/19/2021   Right knee pain 05/07/2020   Strain of muscle, fascia and tendon of lower back, initial encounter 03/21/2020   Statin intolerance 07/24/2017   Chronic pain 11/24/2016   Snoring 07/20/2016   Carotid bruit present 11/10/2015   Right hip pain 10/25/2014   Annual physical exam 98/33/8250   Metabolic syndrome X 53/97/6734   Cervical neck pain with evidence of disc disease 10/19/2010   Type 2 diabetes mellitus with hyperglycemia ([place]) 01/22/2010   Hypothyroidism 06/08/2007   Hyperlipidemia 06/08/2007   Morbid obesity ([place]) 06/08/2007   Depression with anxiety 06/08/2007   Hypertension goal BP (blood pressure) < 130/80  06/08/2007   GERD 06/08/2007   Back pain with radiculopathy 06/08/2007    Conditions to be addressed/monitored: Anxiety and Depression.  Limited Social Support, Mental Health Concerns, Family and Relationship Dysfunction, and Social Isolation.  Care Plan : LCSW Plan of Care  Updates made by Francis Gaines, LCSW since 03/27/2021 12:00 AM     Problem: Begin and Stick with Counseling to Manage Symptoms of Anxiety and Depression.   Priority: High     Goal: Begin and Stick with Counseling to Manage Symptoms of Anxiety and Depression.   Start Date: 03/25/2021  Expected End Date: 06/25/2021  This Visit's Progress: On track  Priority: High  Note:   Current Barriers:   Patient with Acute Mental Health Needs related to Depression, Anxiety, and Chronic Pain, requires Support, Education, Resources, Referrals, Advocacy, and Care Coordination, in order to meet Unmet Acute Mental Health Needs. Patient lacks knowledge of available community counseling agencies and resources. Clinical Goal(s):  Patient will work with LCSW to reduce and manage symptoms of Depression, Anxiety, and Chronic Pain, until well-controlled, or established with a community mental health provider.  Patient will increase knowledge and/or ability of:        Coping Skills, Healthy Habits, Self-Management Skills, Stress Reduction, Home Safety and Utilizing [employer] and Resources.   Interventions:  Assessed patient's previous treatment, needs, coping skills, current treatment, support system, and barriers to care. Collaboration with Primary Care Provider, Dr. Tula Nakayama regarding development and update of comprehensive plan of care as evidenced by provider attestation and co-signature. Inter-disciplinary care team collaboration (see longitudinal plan of care). Clinical Interventions: PHQ-2 and PHQ-9 Depression Screening Tool Performed, and Results Reviewed with Patient. Suicidal Ideation/Homicidal Ideation Assessed -  None Present. Solution-Focused Therapy Performed. Mindfulness Meditation Strategies, Relaxation Techniques, and Deep Breathing Exercises Taught, and Encouraged Daily. Active Listening/Reflection Utilized. Verbalization of Feelings Encouraged, and Emotional Support Provided. Problem Solving/Task-Centered Solutions Developed. Brief Cognitive Behavioral Therapy Initiated. Mental Health Medications Reviewed, and Compliance Emphasized.   Quality of Sleep Assessed, and Sleep Hygiene Techniques Promoted.      Discussed plans with patient for ongoing care management follow-up, and provided patient with direct contact information for care management team. Discussed several options for long-term counseling based on need and insurance through [facility], and verbal consent obtained to submit referral to [facility], for ongoing mental health counseling and supportive services.  [facility], for long-term mental health counseling and supportive services. Patient Goals/Self-Care Activities: Begin personal counseling with LCSW, on a bi-weekly basis, to reduce and manage symptoms of Depression, Anxiety, and Chronic Pain, until established with [facility], or until symptoms are well-controlled. Accept all calls from representative with [facility], in an effort to establish ongoing mental health counseling and supportive services. Incorporate into daily practice - relaxation techniques, deep breathing exercises and mindfulness meditation strategies. Continue with compliance of taking prescription medications, try to obtain adequate rest, stay well-hydrated, and eat a healthy, well-balanced diet. Contact LCSW directly (# [phone]), if you have questions, need assistance, or if additional social work needs are identified between now and our next scheduled telephone outreach call. Follow-Up Date:  04/07/2021 at 1:45 pm    Nat Christen, BSW, MSW, [place]  Licensed Clinical Social Worker  [employer]  Mailing [place]. [address] [place], [place] [address] Physical Address-[address], [place], [place] [address] Toll Free Main # [phone] Fax # [phone] Cell # [phone] [email or web address]

## 2021-04-02 ENCOUNTER — Other Ambulatory Visit: Payer: Self-pay | Admitting: Family Medicine

## 2021-04-07 ENCOUNTER — Ambulatory Visit: Payer: Medicare Other

## 2021-04-07 ENCOUNTER — Ambulatory Visit: Payer: Medicare Other | Admitting: *Deleted

## 2021-04-07 DIAGNOSIS — G894 Chronic pain syndrome: Secondary | ICD-10-CM

## 2021-04-07 DIAGNOSIS — I10 Essential (primary) hypertension: Secondary | ICD-10-CM

## 2021-04-07 DIAGNOSIS — E8881 Metabolic syndrome: Secondary | ICD-10-CM

## 2021-04-07 DIAGNOSIS — F321 Major depressive disorder, single episode, moderate: Secondary | ICD-10-CM

## 2021-04-07 DIAGNOSIS — F418 Other specified anxiety disorders: Secondary | ICD-10-CM

## 2021-04-07 DIAGNOSIS — E1165 Type 2 diabetes mellitus with hyperglycemia: Secondary | ICD-10-CM

## 2021-04-07 DIAGNOSIS — E785 Hyperlipidemia, unspecified: Secondary | ICD-10-CM

## 2021-04-07 NOTE — Chronic Care Management (AMB) (Signed)
?Chronic Care Management  ? ? Clinical Social Work Note ? ?04/07/2021 ?Name: [name] MRN: [id number] DOB: 1955/10/04 ? ?[name] is a 66 y.o. year old female who is a primary care patient of Moshe Cipro, Norwood Levo, MD. The CCM team was consulted to assist the patient with chronic disease management and/or care coordination needs related to: [employer] and [place] and Resources.  ? ?Engaged with patient by telephone for follow up visit in response to provider referral for social work chronic care management and care coordination services.  ? ?Consent to Services:  ?The patient was given information about Chronic Care Management services, agreed to services, and gave verbal consent prior to initiation of services.  Please see initial visit note for detailed documentation.  ? ?Patient agreed to services and consent obtained.  ? ?Assessment: Review of patient past medical history, allergies, medications, and health status, including review of relevant consultants reports was performed today as part of a comprehensive evaluation and provision of chronic care management and care coordination services.    ? ?SDOH (Social Determinants of Health) assessments and interventions performed:   ? ?Advanced Directives Status: Not addressed in this encounter. ? ?CCM Care Plan ? ?Allergies  ?Allergen Reactions  ? Naproxen Sodium Hives and Swelling  ?  MD thought it was the pink dye in the naproxen  ? Tsh [Thyrotropin]   ? Statins Other (See Comments)  ?  Reports increased appetite and possible muscle ache; tolerating rosuvastatin 40 mg daily  ? ? ?Outpatient Encounter Medications as of 04/07/2021  ?Medication Sig Note  ? Alpha Lipoic Acid 200 MG CAPS Take by mouth. Once a day   ? aspirin 81 MG tablet Take 81 mg by mouth daily.   ? benazepril (LOTENSIN) 5 MG tablet TAKE TWO (2) TABLETS BY MOUTH EVERY DAY   ? blood glucose meter kit and supplies Dispense based on patient and insurance preference. Once  daily testing dx e11.9   ? calcium-vitamin D (OSCAL WITH D) 500-200 MG-UNIT per tablet Take 1 tablet by mouth daily.   ? cyclobenzaprine (FLEXERIL) 5 MG tablet TAKE ONE TABLET BY MOUTH AT BEDTIME AS NEEDED. SHOULD LAST SIX MONTHS. 04/25/2017: Takes on avg 2 to 3 times per week  ? Dexlansoprazole 30 MG capsule Take 30 mg by mouth daily.   ? fluticasone (FLONASE) 50 MCG/ACT nasal spray USE 2 SPRAYS IN EACH NOSTRIL ONCE DAILY.  SHAKE GENTLY BEFORE USING.   ? furosemide (LASIX) 20 MG tablet TAKE ONE TABLET BY MOUTH ONCE DAILY FOR LEG SWELLING.   ? gabapentin (NEURONTIN) 300 MG capsule TAKE ONE CAPSULE BY MOUTH TWICE DAILY   ? glipiZIDE (GLIPIZIDE XL) 10 MG 24 hr tablet Take 1 tablet (10 mg total) by mouth daily with breakfast.   ? glucose blood (ONETOUCH VERIO) test strip USE TO TEST BLOOD SUGAR EVERY DAY   ? ibuprofen (ADVIL) 800 MG tablet Take 1 tablet (800 mg total) by mouth every 8 (eight) hours as needed.   ? levothyroxine (SYNTHROID) 150 MCG tablet TAKE ONE TABLET BY MOUTH DAILY.   ? metFORMIN (GLUCOPHAGE) 1000 MG tablet TAKE ONE TABLET BY MOUTH TWICE DAILY WITH A MEAL   ? montelukast (SINGULAIR) 10 MG tablet TAKE ONE TABLET BY MOUTH AT BEDTIME   ? OneTouch Delica Lancets 84Z MISC Once daily testing dx e11.9   ? oxyCODONE-acetaminophen (PERCOCET) 7.5-325 MG tablet Take one tablet once daily , as needed , for uncontrolled back pain   ? potassium chloride SA (  KLOR-CON M) 20 MEQ tablet TAKE ONE TABLET BY MOUTH EVERY DAY AS NEEDED ON DAYS YOU TAKE FUROSEMIDE FOR FOR SWELLING   ? rosuvastatin (CRESTOR) 40 MG tablet Take 1 tablet (40 mg total) by mouth daily.   ? Semaglutide,0.25 or 0.5MG /DOS, (OZEMPIC, 0.25 OR 0.5 MG/DOSE,) 2 MG/1.5ML SOPN Inject 0.25 mg into the skin once a week for 28 days, THEN 0.5 mg once a week for 14 days.   ? [START ON 04/30/2021] Semaglutide,0.25 or 0.5MG /DOS, (OZEMPIC, 0.25 OR 0.5 MG/DOSE,) 2 MG/1.5ML SOPN Inject 0.5 mg into the skin once a week.   ? traZODone (DESYREL) 100 MG tablet TAKE ONE  TABLET BY MOUTH AT BEDTIME   ? Turmeric 500 MG CAPS Take 1 capsule by mouth daily.   ? venlafaxine XR (EFFEXOR XR) 37.5 MG 24 hr capsule Take 1 capsule (37.5 mg total) by mouth daily with breakfast.   ? ?Facility-Administered Encounter Medications as of 04/07/2021  ?Medication  ? Influenza (>/= 3 years) inactive virus vaccine (FLVIRIN/FLUZONE) injection SUSP 0.5 mL  ? methylPREDNISolone acetate (DEPO-MEDROL) injection 120 mg  ? ? ?Patient Active Problem List  ? Diagnosis Date Noted  ? Depression, major, single episode, moderate ([place]) 03/19/2021  ? Right knee pain 05/07/2020  ? Strain of muscle, fascia and tendon of lower back, initial encounter 03/21/2020  ? Statin intolerance 07/24/2017  ? Chronic pain 11/24/2016  ? Snoring 07/20/2016  ? Carotid bruit present 11/10/2015  ? Right hip pain 10/25/2014  ? Annual physical exam 08/28/2014  ? Metabolic syndrome X 63/78/5885  ? Cervical neck pain with evidence of disc disease 10/19/2010  ? Type 2 diabetes mellitus with hyperglycemia ([place]) 01/22/2010  ? Hypothyroidism 06/08/2007  ? Hyperlipidemia 06/08/2007  ? Morbid obesity ([place]) 06/08/2007  ? Depression with anxiety 06/08/2007  ? Hypertension goal BP (blood pressure) < 130/80 06/08/2007  ? GERD 06/08/2007  ? Back pain with radiculopathy 06/08/2007  ? ? ?Conditions to be addressed/monitored: Depression and Anxiety.  Limited Social Support, Mental Health Concerns, and Lacks Knowledge of [employer]. ? ?Care Plan : LCSW Plan of Care  ?Updates made by Francis Gaines, LCSW since 04/07/2021 12:00 AM  ?  ? ?Problem: Begin and Stick with Counseling to Manage Symptoms of Anxiety and Depression.   ?Priority: High  ?  ? ?Goal: Begin and Stick with Counseling to Manage Symptoms of Anxiety and Depression.   ?Start Date: 03/25/2021  ?Expected End Date: 06/25/2021  ?This Visit's Progress: On track  ?Recent Progress: On track  ?Priority: High  ?Note:   ?Current Barriers:   ?Patient with Acute Mental Health Needs related to  Depression, Anxiety, and Chronic Pain, requires Support, Education, Resources, Referrals, Advocacy, and Care Coordination, in order to meet Unmet Acute Mental Health Needs. ?Patient lacks knowledge of available community counseling agencies and resources. ?Clinical Goal(s):  ?Patient will work with LCSW to reduce and manage symptoms of Depression, Anxiety, and Chronic Pain, until well-controlled.     ?Patient will increase knowledge and/or ability of:  ?      Coping Skills, Healthy Habits, Self-Management Skills, Stress Reduction, Home Safety and Utilizing [employer] and Resources.   ?Interventions:  ?Collaboration with Primary Care Provider, Dr. Tula Nakayama regarding development and update of comprehensive plan of care as evidenced by provider attestation and co-signature. ?Inter-disciplinary care team collaboration (see longitudinal plan of care). ?Clinical Interventions: ?Solution-Focused Therapy Performed. ?Mindfulness Meditation Strategies, Relaxation Techniques, and Deep Breathing Exercises Taught, and Encouraged Daily. ?Active Listening/Reflection Utilized. ?Verbalization of Feelings Encouraged, and Emotional  Support Provided. ?Brief Cognitive Behavioral Therapy Initiated. ?Patient Goals/Self-Care Activities: ?Continue to receive personal counseling with LCSW, on a bi-weekly basis, to reduce and manage symptoms of Depression, Anxiety, and Chronic Pain, until well-controlled. ?Continue to incorporate into daily practice - relaxation techniques, deep breathing exercises, and mindfulness meditation strategies. ?Review EMMI Educational Material on "Depression in Adults" and "Coping with a Health Condition:  Signs of Depression", mailed to your home today. ?Contact LCSW directly (# [phone]), if you have questions, need assistance, or if additional social work needs are identified between now and our next scheduled telephone outreach call. ?Follow-Up Date:  04/21/2021 at 1:30 pm ? ?  ?Nat Christen LCSW ?Licensed Clinical Social Worker ?[place] ?[phone]  ?

## 2021-04-07 NOTE — Patient Instructions (Signed)
Visit Information ? ?Thank you for taking time to visit with me today. Please don't hesitate to contact me if I can be of assistance to you before our next scheduled telephone appointment. ? ?Following are the goals we discussed today:  ?Patient Goals/Self-Care Activities: ?Continue to receive personal counseling with LCSW, on a bi-weekly basis, to reduce and manage symptoms of Depression, Anxiety, and Chronic Pain, until well-controlled. ?Continue to incorporate into daily practice - relaxation techniques, deep breathing exercises, and mindfulness meditation strategies. ?Review EMMI Educational Material on "Depression in Adults" and "Coping with a Health Condition:  Signs of Depression", mailed to your home today. ?Contact LCSW directly (# [phone]), if you have questions, need assistance, or if additional social work needs are identified between now and our next scheduled telephone outreach call. ?Follow-Up Date:  04/21/2021 at 1:30 pm ? ?Please call the care guide team at [phone] if you need to cancel or reschedule your appointment.  ? ?If you are experiencing a Mental Health or [place] or need someone to talk to, please call the Suicide and Crisis Lifeline: 988 ?call the [place] National Suicide Prevention Lifeline: (478) 679-3710 or TTY: 660-320-7073 TTY (671)637-0568) to talk to a trained counselor ?call 1-800-273-TALK (toll free, 24 hour hotline) ?go to [facility] [address] [place] [phone]) ?call the [facility]: [phone] ?call 911  ? ?Patient verbalizes understanding of instructions and care plan provided today and agrees to view in [place]. Active MyChart status confirmed with patient.   ? ?Nat Christen LCSW ?Licensed Clinical Social Worker ?[place] ?[phone]  ?

## 2021-04-09 ENCOUNTER — Telehealth: Payer: Medicare Other

## 2021-04-16 ENCOUNTER — Other Ambulatory Visit: Payer: Self-pay | Admitting: Family Medicine

## 2021-04-16 ENCOUNTER — Telehealth: Payer: Self-pay | Admitting: Family Medicine

## 2021-04-16 NOTE — Progress Notes (Signed)
Stop ozempic due to s/e ?

## 2021-04-16 NOTE — Telephone Encounter (Signed)
Patient called in regard to [facility] ? ?Patient states med is making her sick ?Nausea ?Can't eat  ?Can't drink  ?Can't use bathroom  ? ?Patient wants to know if should stop taking med  ?  ?Requesting call back  ?

## 2021-04-16 NOTE — Telephone Encounter (Signed)
Patient aware.

## 2021-04-18 ENCOUNTER — Encounter (HOSPITAL_COMMUNITY): Payer: Self-pay | Admitting: Emergency Medicine

## 2021-04-18 ENCOUNTER — Emergency Department (HOSPITAL_COMMUNITY): Payer: Medicare Other

## 2021-04-18 ENCOUNTER — Emergency Department (HOSPITAL_COMMUNITY)
Admission: EM | Admit: 2021-04-18 | Discharge: 2021-04-18 | Disposition: A | Discharge status: Home / Self Care | Payer: Medicare Other | Attending: Emergency Medicine | Admitting: Emergency Medicine

## 2021-04-18 ENCOUNTER — Other Ambulatory Visit: Payer: Self-pay

## 2021-04-18 DIAGNOSIS — D72829 Elevated white blood cell count, unspecified: Secondary | ICD-10-CM | POA: Insufficient documentation

## 2021-04-18 DIAGNOSIS — Z7984 Long term (current) use of oral hypoglycemic drugs: Secondary | ICD-10-CM | POA: Insufficient documentation

## 2021-04-18 DIAGNOSIS — Z7982 Long term (current) use of aspirin: Secondary | ICD-10-CM | POA: Insufficient documentation

## 2021-04-18 DIAGNOSIS — R1013 Epigastric pain: Secondary | ICD-10-CM | POA: Insufficient documentation

## 2021-04-18 DIAGNOSIS — E119 Type 2 diabetes mellitus without complications: Secondary | ICD-10-CM | POA: Insufficient documentation

## 2021-04-18 DIAGNOSIS — I10 Essential (primary) hypertension: Secondary | ICD-10-CM | POA: Diagnosis not present

## 2021-04-18 DIAGNOSIS — Z79899 Other long term (current) drug therapy: Secondary | ICD-10-CM | POA: Diagnosis not present

## 2021-04-18 DIAGNOSIS — R109 Unspecified abdominal pain: Secondary | ICD-10-CM | POA: Diagnosis not present

## 2021-04-18 DIAGNOSIS — K760 Fatty (change of) liver, not elsewhere classified: Secondary | ICD-10-CM | POA: Diagnosis not present

## 2021-04-18 DIAGNOSIS — R079 Chest pain, unspecified: Secondary | ICD-10-CM | POA: Diagnosis not present

## 2021-04-18 DIAGNOSIS — I700 Atherosclerosis of aorta: Secondary | ICD-10-CM | POA: Diagnosis not present

## 2021-04-18 LAB — COMPREHENSIVE METABOLIC PANEL
ALT: 28 U/L (ref 0–44)
AST: 37 U/L (ref 15–41)
Albumin: 4 g/dL (ref 3.5–5.0)
Alkaline Phosphatase: 65 U/L (ref 38–126)
Anion gap: 8 (ref 5–15)
BUN: 14 mg/dL (ref 8–23)
CO2: 25 mmol/L (ref 22–32)
Calcium: 10 mg/dL (ref 8.9–10.3)
Chloride: 104 mmol/L (ref 98–111)
Creatinine, Ser: 0.95 mg/dL (ref 0.44–1.00)
GFR, Estimated: >60 mL/min (ref >60)
Glucose, Bld: 133 mg/dL — ABNORMAL HIGH (ref 70–99)
Potassium: 4.1 mmol/L (ref 3.5–5.1)
Sodium: 137 mmol/L (ref 135–145)
Total Bilirubin: 0.5 mg/dL (ref 0.3–1.2)
Total Protein: 7.6 g/dL (ref 6.5–8.1)

## 2021-04-18 LAB — CBC
HCT: 44.8 % (ref 36.0–46.0)
Hemoglobin: 14.5 g/dL (ref 12.0–15.0)
MCH: 29.8 pg (ref 26.0–34.0)
MCHC: 32.4 g/dL (ref 30.0–36.0)
MCV: 92.2 fL (ref 80.0–100.0)
Platelets: 393 10*3/uL (ref 150–400)
RBC: 4.86 MIL/uL (ref 3.87–5.11)
RDW: 13.6 % (ref 11.5–15.5)
WBC: 10.8 10*3/uL — ABNORMAL HIGH (ref 4.0–10.5)
nRBC: 0 % (ref 0.0–0.2)

## 2021-04-18 LAB — TROPONIN I (HIGH SENSITIVITY)
Troponin I (High Sensitivity): <2 ng/L (ref <18)
Troponin I (High Sensitivity): <2 ng/L (ref <18)

## 2021-04-18 LAB — DIFFERENTIAL
Abs Immature Granulocytes: 0.02 10*3/uL (ref 0.00–0.07)
Basophils Absolute: 0.1 10*3/uL (ref 0.0–0.1)
Basophils Relative: 1 %
Eosinophils Absolute: 0.2 10*3/uL (ref 0.0–0.5)
Eosinophils Relative: 2 %
Immature Granulocytes: 0 %
Lymphocytes Relative: 39 %
Lymphs Abs: 4.3 10*3/uL — ABNORMAL HIGH (ref 0.7–4.0)
Monocytes Absolute: 0.8 10*3/uL (ref 0.1–1.0)
Monocytes Relative: 8 %
Neutro Abs: 5.4 10*3/uL (ref 1.7–7.7)
Neutrophils Relative %: 50 %

## 2021-04-18 LAB — LIPASE, BLOOD: Lipase: 38 U/L (ref 11–51)

## 2021-04-18 MED ORDER — FAMOTIDINE IN NACL 20-0.9 MG/50ML-% IV SOLN
20.0000 mg | Freq: Once | INTRAVENOUS | Status: AC
  Administered 2021-04-18: 20 mg via INTRAVENOUS
  Filled 2021-04-18: qty 50

## 2021-04-18 MED ORDER — SODIUM CHLORIDE 0.9 % IV BOLUS
500.0000 mL | Freq: Once | INTRAVENOUS | Status: AC
  Administered 2021-04-18: 500 mL via INTRAVENOUS

## 2021-04-18 MED ORDER — IOHEXOL 300 MG/ML  SOLN
100.0000 mL | Freq: Once | INTRAMUSCULAR | Status: AC | PRN
  Administered 2021-04-18: 100 mL via INTRAVENOUS

## 2021-04-18 NOTE — ED Triage Notes (Signed)
Patient c/o epigastric pain/pressure that started 3-4 days ago with nausea, vomiting and "light headedness." Denies any shortness of breath. Denies any cardiac hx. Patient took '81mg'$  aspirin today.  ?

## 2021-04-18 NOTE — ED Provider Notes (Signed)
?[facility] ?Provider Note ? ? ?CSN: [id number] ?Arrival date & time: 04/18/21  1648 ? ?  ? ?History ? ?Chief Complaint  ?Patient presents with  ? Abdominal Pain  ? ? ?[name] is a 66 y.o. female. ? ? ?Abdominal Pain ? ?Patient is a 66 year old female with history of diabetes, hypertension, hyperlipidemia, morbid obesity presenting today due to epigastric pain that radiates up her chest.  Started 3 to 4 days ago, its constant.  It feels like a burning pain it is worse after she eats.  She has been taking her reflux medicine as prescribed without any changes.  States that she started on Ozempic 1 month ago, was discontinued today by her primary.  She denies any vomiting but does feel nauseated, no change in bowel habits.  No cardiac history, she denies any specific chest pain but feels like she has burning that moves of her chest. ? ?Home Medications ?Prior to Admission medications   ?Medication Sig Start Date End Date Taking? Authorizing Provider  ?Alpha Lipoic Acid 200 MG CAPS Take by mouth. Once a day   Yes [provider]  ?aspirin 81 MG tablet Take 81 mg by mouth daily.   Yes [provider]  ?benazepril (LOTENSIN) 5 MG tablet TAKE TWO (2) TABLETS BY MOUTH EVERY DAY ?Patient taking differently: Take 10 mg by mouth daily. 04/02/21  Yes Fayrene Helper, MD  ?calcium-vitamin D (OSCAL WITH D) 500-200 MG-UNIT per tablet Take 1 tablet by mouth daily.   Yes [provider]  ?cyclobenzaprine (FLEXERIL) 5 MG tablet TAKE ONE TABLET BY MOUTH AT BEDTIME AS NEEDED. SHOULD LAST SIX MONTHS. ?Patient taking differently: Take 5 mg by mouth daily as needed for muscle spasms. 02/28/15  Yes Fayrene Helper, MD  ?Dexlansoprazole 30 MG capsule Take 30 mg by mouth daily.   Yes [provider]  ?fluticasone (FLONASE) 50 MCG/ACT nasal spray USE 2 SPRAYS IN EACH NOSTRIL ONCE DAILY.  SHAKE GENTLY BEFORE USING. ?Patient taking differently: Place 2 sprays into both nostrils  daily. 04/02/21  Yes Fayrene Helper, MD  ?furosemide (LASIX) 20 MG tablet TAKE ONE TABLET BY MOUTH ONCE DAILY FOR LEG SWELLING. ?Patient taking differently: Take 20 mg by mouth daily as needed for fluid. 04/02/21  Yes Fayrene Helper, MD  ?gabapentin (NEURONTIN) 300 MG capsule TAKE ONE CAPSULE BY MOUTH TWICE DAILY 10/06/20  Yes Fayrene Helper, MD  ?glipiZIDE (GLIPIZIDE XL) 10 MG 24 hr tablet Take 1 tablet (10 mg total) by mouth daily with breakfast. 03/19/21  Yes Fayrene Helper, MD  ?ibuprofen (ADVIL) 800 MG tablet Take 1 tablet (800 mg total) by mouth every 8 (eight) hours as needed. 05/07/20  Yes Fayrene Helper, MD  ?levothyroxine (SYNTHROID) 150 MCG tablet TAKE ONE TABLET BY MOUTH DAILY. 04/02/21  Yes Fayrene Helper, MD  ?metFORMIN (GLUCOPHAGE) 1000 MG tablet TAKE ONE TABLET BY MOUTH TWICE DAILY WITH A MEAL 04/02/21  Yes Fayrene Helper, MD  ?montelukast (SINGULAIR) 10 MG tablet TAKE ONE TABLET BY MOUTH AT BEDTIME 04/02/21  Yes Fayrene Helper, MD  ?oxyCODONE-acetaminophen (PERCOCET) 7.5-325 MG tablet Take one tablet once daily , as needed , for uncontrolled back pain 03/17/18  Yes Fayrene Helper, MD  ?potassium chloride SA (KLOR-CON M) 20 MEQ tablet TAKE ONE TABLET BY MOUTH EVERY DAY AS NEEDED ON DAYS YOU TAKE FUROSEMIDE FOR FOR SWELLING ?Patient taking differently: Take 20 mEq by mouth See admin instructions. TAKE ONE TABLET BY MOUTH  EVERY DAY AS NEEDED ON DAYS YOU TAKE FUROSEMIDE FOR FOR SWELLING 04/02/21  Yes Fayrene Helper, MD  ?rosuvastatin (CRESTOR) 40 MG tablet Take 1 tablet (40 mg total) by mouth daily. 01/02/21  Yes [employer], Dewaine Conger, FNP  ?traZODone (DESYREL) 100 MG tablet TAKE ONE TABLET BY MOUTH AT BEDTIME 04/02/21  Yes Fayrene Helper, MD  ?Turmeric 500 MG CAPS Take 1 capsule by mouth daily.   Yes [provider]  ?blood glucose meter kit and supplies Dispense based on patient and insurance preference. Once daily testing dx e11.9 08/14/19   Fayrene Helper, MD  ?glucose blood [facility] VERIO) test strip USE TO TEST BLOOD SUGAR EVERY DAY 01/02/21   Renee Rival, FNP  ?OneTouch Delica Lancets 65K MISC Once daily testing dx e11.9 11/15/19   Fayrene Helper, MD  ?venlafaxine XR (EFFEXOR XR) 37.5 MG 24 hr capsule Take 1 capsule (37.5 mg total) by mouth daily with breakfast. ?Patient not taking: Reported on 04/18/2021 03/19/21   Fayrene Helper, MD  ?   ? ?Allergies    ?Ozempic (0.25 or 0.5 mg-dose) [semaglutide(0.25 or 0.72m-dos)], Naproxen sodium, Tsh [thyrotropin], and Statins   ? ?Review of Systems   ?Review of Systems  ?Gastrointestinal:  Positive for abdominal pain.  ? ?Physical Exam ?Updated Vital Signs ?BP (!) 116/59   Pulse 74   Temp 97.7 ?F (36.5 ?C) (Oral)   Resp 13   Ht '5\' 8"'  (1.727 m)   Wt 129.3 kg   SpO2 100%   BMI 43.33 kg/m?  ?Physical Exam ?Vitals and nursing note reviewed. Exam conducted with a chaperone present.  ?Constitutional:   ?   Appearance: Normal appearance.  ?HENT:  ?   Head: Normocephalic and atraumatic.  ?Eyes:  ?   General: No scleral icterus.    ?   Right eye: No discharge.     ?   Left eye: No discharge.  ?   Extraocular Movements: Extraocular movements intact.  ?   Pupils: Pupils are equal, round, and reactive to light.  ?Cardiovascular:  ?   Rate and Rhythm: Normal rate and regular rhythm.  ?   Pulses: Normal pulses.  ?   Heart sounds: Normal heart sounds. No murmur heard. ?  No friction rub. No gallop.  ?Pulmonary:  ?   Effort: Pulmonary effort is normal. No respiratory distress.  ?   Breath sounds: Normal breath sounds.  ?Abdominal:  ?   General: Abdomen is flat. Bowel sounds are normal. There is no distension.  ?   Palpations: Abdomen is soft.  ?   Tenderness: There is abdominal tenderness in the epigastric area.  ?Skin: ?   General: Skin is warm and dry.  ?   Coloration: Skin is not jaundiced.  ?Neurological:  ?   Mental Status: She is alert. Mental status is at baseline.  ?   Coordination: Coordination normal.   ? ? ?ED Results / Procedures / Treatments   ?Labs ?(all labs ordered are listed, but only abnormal results are displayed) ?Labs Reviewed  ?COMPREHENSIVE METABOLIC PANEL - Abnormal; Notable for the following components:  ?    Result Value  ? Glucose, Bld 133 (*)   ? All other components within normal limits  ?CBC - Abnormal; Notable for the following components:  ? WBC 10.8 (*)   ? All other components within normal limits  ?DIFFERENTIAL - Abnormal; Notable for the following components:  ? Lymphs Abs 4.3 (*)   ? All other  components within normal limits  ?LIPASE, BLOOD  ?TROPONIN I (HIGH SENSITIVITY)  ?TROPONIN I (HIGH SENSITIVITY)  ? ? ?EKG ?EKG Interpretation ? ?Date/Time:  Saturday April 18 2021 17:20:22 EDT ?Ventricular Rate:  90 ?PR Interval:    ?QRS Duration: 87 ?QT Interval:  352 ?QTC Calculation: 431 ?R Axis:   42 ?Text Interpretation: Normal sinus rhythm Low voltage, precordial leads No significant change since last tracing Confirmed by Isla Pence [phone]) on 04/18/2021 5:29:29 PM ? ?Radiology ?DG Chest 2 View ? ?Result Date: 04/18/2021 ?CLINICAL DATA:  Chest pain EXAM: CHEST - 2 VIEW COMPARISON:  04/27/2010 FINDINGS: The heart size and mediastinal contours are within normal limits. Both lungs are clear. The visualized skeletal structures are unremarkable. IMPRESSION: No active cardiopulmonary disease. Electronically Signed   By: Elmer Picker M.D.   On: 04/18/2021 17:58  ? ?CT ABDOMEN PELVIS W CONTRAST ? ?Result Date: 04/18/2021 ?CLINICAL DATA:  Epigastric pain and nausea x4 days. EXAM: CT ABDOMEN AND PELVIS WITH CONTRAST TECHNIQUE: Multidetector CT imaging of the abdomen and pelvis was performed using the standard protocol following bolus administration of intravenous contrast. RADIATION DOSE REDUCTION: This exam was performed according to the departmental dose-optimization program which includes automated exposure control, adjustment of the mA and/or kV according to patient size and/or use of  iterative reconstruction technique. CONTRAST:  146m OMNIPAQUE IOHEXOL 300 MG/ML  SOLN COMPARISON:  None. FINDINGS: Lower chest: No acute abnormality. Hepatobiliary: There is mild diffuse fatty infiltration of the

## 2021-04-18 NOTE — Discharge Instructions (Signed)
Your work-up today was reassuring.  There is no signs of cardiac injury, no pancreatitis.  CT of your abdomen not show any acute process that would require surgery or hospitalization.  Follow-up with your primary next week, continue taking your reflux medicine.  Take Tylenol for pain.  Return if things change or worsen. ?

## 2021-04-20 ENCOUNTER — Telehealth: Payer: Self-pay | Admitting: Cardiovascular Disease

## 2021-04-20 ENCOUNTER — Other Ambulatory Visit: Payer: Self-pay | Admitting: Family Medicine

## 2021-04-20 NOTE — Telephone Encounter (Signed)
Pt c/o of Chest Pain: STAT if CP now or developed within 24 hours ? ?1. Are you having CP right now? yes ? ?2. Are you experiencing any other symptoms (ex. SOB, nausea, vomiting, sweating)? Burns when she eats ? ?3. How long have you been experiencing CP? Since Thursday  ? ?4. Is your CP continuous or coming and going? Comes and goes today ? ?5. Have you taken Nitroglycerin? no ? ?Patient states she went to the ED for pain in her chest and was told it was a viral infection. She says she is having pain now at the top of her chest and is not sure if it is the infection or her heart. She says it is not as bad now, but tends to happen when she eats or drinks anything.  ? ? ? ??  ?

## 2021-04-20 NOTE — Telephone Encounter (Signed)
Pt called to report that she has been seen in the ED over the weekend... she has been having GI upset and burning in her chest... she had some "pain: in her chest and went top the ED.. she tested neg for cardiac issues but she had elevated WBC and was told she had a Viral infection... she has only had 2 bowel movement this oast month since starting Ozempic... she has been taken off if it this past week due to nausea and just not feeling well... she was told in the ED to call her PCP for further follow up...she called them this morning and they cannot see her for 2 weeks and asked that she also call her cardiologist since she has had chest discomfort.  ? ?I made her an appt with an AP tomorrow. She will consider going back to the ED if her symptoms worsen... she says they worsen when eating so I also asked her to call her PCP back and ask for a sooner appt.  ?

## 2021-04-21 ENCOUNTER — Encounter: Payer: Self-pay | Admitting: Physician Assistant

## 2021-04-21 ENCOUNTER — Ambulatory Visit: Payer: Medicare Other | Admitting: *Deleted

## 2021-04-21 ENCOUNTER — Other Ambulatory Visit: Payer: Self-pay

## 2021-04-21 ENCOUNTER — Ambulatory Visit: Payer: Medicare Other | Admitting: Physician Assistant

## 2021-04-21 VITALS — BP 116/74 | HR 86 | Ht 68.0 in | Wt 286.0 lb

## 2021-04-21 DIAGNOSIS — R109 Unspecified abdominal pain: Secondary | ICD-10-CM

## 2021-04-21 DIAGNOSIS — R1310 Dysphagia, unspecified: Secondary | ICD-10-CM

## 2021-04-21 DIAGNOSIS — F418 Other specified anxiety disorders: Secondary | ICD-10-CM

## 2021-04-21 DIAGNOSIS — E785 Hyperlipidemia, unspecified: Secondary | ICD-10-CM

## 2021-04-21 DIAGNOSIS — I10 Essential (primary) hypertension: Secondary | ICD-10-CM

## 2021-04-21 DIAGNOSIS — G894 Chronic pain syndrome: Secondary | ICD-10-CM

## 2021-04-21 DIAGNOSIS — E1165 Type 2 diabetes mellitus with hyperglycemia: Secondary | ICD-10-CM

## 2021-04-21 DIAGNOSIS — F321 Major depressive disorder, single episode, moderate: Secondary | ICD-10-CM

## 2021-04-21 DIAGNOSIS — E119 Type 2 diabetes mellitus without complications: Secondary | ICD-10-CM

## 2021-04-21 DIAGNOSIS — E8881 Metabolic syndrome: Secondary | ICD-10-CM

## 2021-04-21 NOTE — Patient Instructions (Signed)
Visit Information ? ?Thank you for taking time to visit with me today. Please don't hesitate to contact me if I can be of assistance to you before our next scheduled telephone appointment. ? ?Following are the goals we discussed today:  ?Patient Goals/Self-Care Activities: ?Continue to incorporate into daily practice - relaxation techniques, deep breathing exercises, and mindfulness meditation strategies. ?Continued review of EMMI Educational Material on "Depression in Adults" and "Coping with a Health Condition:  Signs of Depression". ?Contact LCSW directly (# [phone]), if you have questions, need assistance, or if additional social work needs are identified in the near future.   ?No Follow-Up Required. ? ?Please call the care guide team at [phone] if you need to cancel or reschedule your appointment.  ? ?If you are experiencing a Mental Health or [place] or need someone to talk to, please call the Suicide and Crisis Lifeline: 988 ?call the [place] National Suicide Prevention Lifeline: 956-051-7866 or TTY: 830-214-4030 TTY (743)327-4182) to talk to a trained counselor ?call 1-800-273-TALK (toll free, 24 hour hotline) ?go to [facility] [address], [place] [phone]) ?call the [facility]: [phone] ?call 911  ? ?Patient verbalizes understanding of instructions and care plan provided today and agrees to view in [place]. Active MyChart status confirmed with patient.   ? ?Nat Christen LCSW ?Licensed Clinical Social Worker ?[facility] ?[phone] ?

## 2021-04-21 NOTE — Chronic Care Management (AMB) (Signed)
?Chronic Care Management  ? ? Clinical Social Work Note ? ?04/21/2021 ?Name: [name] MRN: [id number] DOB: Oct 11, 1955 ? ?[name] is a 66 y.o. year old female who is a primary care patient of Moshe Cipro [place] Levo, MD. The CCM team was consulted to assist the patient with chronic disease management and/or care coordination needs related to: Mental Health Counseling and Resources.  ? ?Engaged with patient by telephone for follow up visit in response to provider referral for social work chronic care management and care coordination services.  ? ?Consent to Services:  ?The patient was given information about Chronic Care Management services, agreed to services, and gave verbal consent prior to initiation of services.  Please see initial visit note for detailed documentation.  ? ?Patient agreed to services and consent obtained.  ? ?Assessment: Review of patient past medical history, allergies, medications, and health status, including review of relevant consultants reports was performed today as part of a comprehensive evaluation and provision of chronic care management and care coordination services.    ? ?SDOH (Social Determinants of Health) assessments and interventions performed:   ? ?Advanced Directives Status: Not addressed in this encounter. ? ?CCM Care Plan ? ?Allergies  ?Allergen Reactions  ? Ozempic (0.25 Or 0.5 Mg-Dose) [Semaglutide(0.25 Or 0.56m-Dos)] Nausea Only  ?  dizzy  ? Naproxen Sodium Hives and Swelling  ?  MD thought it was the pink dye in the naproxen  ? Tsh [Thyrotropin]   ? Statins Other (See Comments)  ?  Reports increased appetite and possible muscle ache; tolerating rosuvastatin 40 mg daily  ? ? ?Outpatient Encounter Medications as of 04/21/2021  ?Medication Sig Note  ? Alpha Lipoic Acid 200 MG CAPS Take by mouth. Once a day   ? aspirin 81 MG tablet Take 81 mg by mouth daily.   ? benazepril (LOTENSIN) 5 MG tablet TAKE TWO (2) TABLETS BY MOUTH EVERY DAY (Patient taking differently: Take  10 mg by mouth daily.)   ? blood glucose meter kit and supplies Dispense based on patient and insurance preference. Once daily testing dx e11.9   ? calcium-vitamin D (OSCAL WITH D) 500-200 MG-UNIT per tablet Take 1 tablet by mouth daily.   ? cyclobenzaprine (FLEXERIL) 5 MG tablet TAKE ONE TABLET BY MOUTH AT BEDTIME AS NEEDED. SHOULD LAST SIX MONTHS. (Patient taking differently: Take 5 mg by mouth daily as needed for muscle spasms.) 04/25/2017: Takes on avg 2 to 3 times per week  ? Dexlansoprazole 30 MG capsule Take 30 mg by mouth daily.   ? fluticasone (FLONASE) 50 MCG/ACT nasal spray USE 2 SPRAYS IN EACH NOSTRIL ONCE DAILY.  SHAKE GENTLY BEFORE USING. (Patient taking differently: Place 2 sprays into both nostrils daily.)   ? furosemide (LASIX) 20 MG tablet TAKE ONE TABLET BY MOUTH ONCE DAILY FOR LEG SWELLING. (Patient taking differently: Take 20 mg by mouth daily as needed for fluid.)   ? gabapentin (NEURONTIN) 300 MG capsule TAKE ONE CAPSULE BY MOUTH TWICE DAILY   ? glipiZIDE (GLIPIZIDE XL) 10 MG 24 hr tablet Take 1 tablet (10 mg total) by mouth daily with breakfast.   ? glucose blood (ONETOUCH VERIO) test strip USE TO TEST BLOOD SUGAR EVERY DAY   ? ibuprofen (ADVIL) 800 MG tablet Take 1 tablet (800 mg total) by mouth every 8 (eight) hours as needed.   ? Lancets (ONETOUCH DELICA PLUS [id number] MISC USE TO TEST BLOOD SUGAR ONCE DAILY   ? levothyroxine (SYNTHROID) 150 MCG tablet TAKE ONE TABLET BY  MOUTH DAILY.   ? metFORMIN (GLUCOPHAGE) 1000 MG tablet TAKE ONE TABLET BY MOUTH TWICE DAILY WITH A MEAL   ? montelukast (SINGULAIR) 10 MG tablet TAKE ONE TABLET BY MOUTH AT BEDTIME   ? oxyCODONE-acetaminophen (PERCOCET) 7.5-325 MG tablet Take one tablet once daily , as needed , for uncontrolled back pain   ? potassium chloride SA (KLOR-CON M) 20 MEQ tablet TAKE ONE TABLET BY MOUTH EVERY DAY AS NEEDED ON DAYS YOU TAKE FUROSEMIDE FOR FOR SWELLING (Patient taking differently: Take 20 mEq by mouth See admin instructions. TAKE  ONE TABLET BY MOUTH EVERY DAY AS NEEDED ON DAYS YOU TAKE FUROSEMIDE FOR FOR SWELLING)   ? rosuvastatin (CRESTOR) 40 MG tablet Take 1 tablet (40 mg total) by mouth daily.   ? traZODone (DESYREL) 100 MG tablet TAKE ONE TABLET BY MOUTH AT BEDTIME   ? Turmeric 500 MG CAPS Take 1 capsule by mouth daily.   ? venlafaxine XR (EFFEXOR XR) 37.5 MG 24 hr capsule Take 1 capsule (37.5 mg total) by mouth daily with breakfast. (Patient not taking: Reported on 04/18/2021)   ? ?Facility-Administered Encounter Medications as of 04/21/2021  ?Medication  ? Influenza (>/= 3 years) inactive virus vaccine (FLVIRIN/FLUZONE) injection SUSP 0.5 mL  ? methylPREDNISolone acetate (DEPO-MEDROL) injection 120 mg  ? ? ?Patient Active Problem List  ? Diagnosis Date Noted  ? Depression, major, single episode, moderate ([place]) 03/19/2021  ? Right knee pain 05/07/2020  ? Strain of muscle, fascia and tendon of lower back, initial encounter 03/21/2020  ? Statin intolerance 07/24/2017  ? Chronic pain 11/24/2016  ? Snoring 07/20/2016  ? Carotid bruit present 11/10/2015  ? Right hip pain 10/25/2014  ? Annual physical exam 08/28/2014  ? Metabolic syndrome X 73/22/0254  ? Cervical neck pain with evidence of disc disease 10/19/2010  ? Type 2 diabetes mellitus with hyperglycemia ([place]) 01/22/2010  ? Hypothyroidism 06/08/2007  ? Hyperlipidemia 06/08/2007  ? Morbid obesity ([place]) 06/08/2007  ? Depression with anxiety 06/08/2007  ? Hypertension goal BP (blood pressure) < 130/80 06/08/2007  ? GERD 06/08/2007  ? Back pain with radiculopathy 06/08/2007  ? ? ?Conditions to be addressed/monitored: Anxiety and Depression.  Limited Social Support, Mental Health Concerns, and Social Isolation, and Lacks Knowledge of [employer]. ? ?Care Plan : LCSW Plan of Care  ?Updates made by Francis Gaines, LCSW since 04/21/2021 12:00 AM  ?  ? ?Problem: Begin and Stick with Counseling to Manage Symptoms of Anxiety and Depression. Resolved 04/21/2021  ?Priority: High  ?  ? ?Goal:  Begin and Stick with Counseling to Manage Symptoms of Anxiety and Depression. Completed 04/21/2021  ?Start Date: 03/25/2021  ?Expected End Date: 04/21/2021  ?This Visit's Progress: On track  ?Recent Progress: On track  ?Priority: High  ?Note:   ?Current Barriers:   ?Patient with Acute Mental Health Needs related to Depression, Anxiety, and Chronic Pain, requires Support, Education, Resources, Referrals, Advocacy, and Care Coordination, in order to meet Unmet Acute Mental Health Needs. ?Patient lacks knowledge of available community counseling agencies and resources. ?Clinical Goal(s):  ?Patient will work with LCSW to reduce and manage symptoms of Depression, Anxiety, and Chronic Pain, until well-controlled.     ?Patient will increase knowledge and/or ability of:  ?      Coping Skills, Healthy Habits, Self-Management Skills, Stress Reduction, Home Safety and Utilizing [employer] and Resources.   ?Interventions:  ?Collaboration with Primary Care Provider, Dr. Tula Nakayama regarding development and update of comprehensive plan of care as evidenced by  provider attestation and co-signature. ?Inter-disciplinary care team collaboration (see longitudinal plan of care). ?Clinical Interventions: ?Solution-Focused Therapy Performed. ?Mindfulness Meditation Strategies, Relaxation Techniques, and Deep Breathing Exercises Taught, and Encouraged Daily. ?Active Listening/Reflection Utilized. ?Verbalization of Feelings Encouraged, and Emotional Support Provided. ?Brief Cognitive Behavioral Therapy Initiated. ?Patient Goals/Self-Care Activities: ?Continue to incorporate into daily practice - relaxation techniques, deep breathing exercises, and mindfulness meditation strategies. ?Continued review of EMMI Educational Material on "Depression in Adults" and "Coping with a Health Condition:  Signs of Depression". ?Contact LCSW directly (# [phone]), if you have questions, need assistance, or if additional social work needs are  identified in the near future.   ?No Follow-Up Required. ? ?  ?Nat Christen LCSW ?Licensed Clinical Social Worker ?[facility] ?[phone]  ?

## 2021-04-21 NOTE — Progress Notes (Signed)
?Cardiology Office Note:   ? ?Date:  04/23/2021  ? ?ID:  [name], DOB 1955-08-24, MRN [id number] ? ?PCP:  Fayrene Helper, MD ?  ?[facility] Providers ?Cardiologist:  Shelva Majestic, MD  he has aspirin listed the cardiology PA is is a annual clinical pharmacist Dr. Faythe Ghee thank you ? ?Referring MD: Fayrene Helper, MD  ? ?Chief Complaint  ?Patient presents with  ? Follow-up  ? Chest Pain  ?  Discomfort.  ? Headache  ? Edema  ? ? ?History of Present Illness:   ? ?[name] is a 66 y.o. female with a hx of HTN, HLD, DM II, and GERD.  Patient was seen for chest discomfort in 2017 when she was under significant stress secondary to family issues.  She has significant family history of CAD.  Echocardiogram obtained on 12/03/2015 showed EF 50 to 55%, grade 1 DD, trivial MR.  Myoview was low risk and showed normal perfusion and function.  She underwent sleep study in July 2018 that revealed increased upper airway resistance syndrome with AHI 3.6/h.  She did not meet CPAP criteria.  Patient was last seen by Dr. Claiborne Billings October 2022 at which time she was doing well.  More recently, patient was seen in the ED on 04/18/2021 with epigastric pain that radiating up her chest.  She described as a burning sensation that is worse after she eats.  She was advised to follow-up with GI service for upper endoscopy.  Chest x-ray normal.  EKG was negative for ischemia.  CT of abdomen and pelvis showed a small hiatal hernia, hepatic steatosis, mild nonspecific mesenteric inflammation fat stranding within the mid to lower left abdomen which may be chronic in nature, aortic atherosclerosis.  Serial troponin negative.  Red blood cell count, renal function and electrolyte were normal. ? ?Patient presents today for evaluation of chest pain and abdominal pain.  She says she started Ozempic roughly a month ago.  Her last dose of Ozempic was on 04/11/2021.  Started last Thursday, she had diffuse abdominal and lower chest discomfort.   Her pain would worse when she tried to swallow food or liquid.  She was unable to eat for the next 4 days.  Started yesterday, she was finally able to eat a little bit more.  She has stopped Ozempic last week.  On physical exam, her abdomen is diffusely tender.  EKG and chest x-ray were normal during the recent ED visit.  I discussed her case with DOD Dr. Stanford Breed, her symptoms sounds more GI rather than cardiac.  We will hold off on ischemic work-up at this point and refer the patient urgently for GI evaluation for dysphagia and abdominal discomfort.  Patient can follow-up with cardiology service in 92-monthfor reassessment ? ? ?Past Medical History:  ?Diagnosis Date  ? Allergy   ? Anemia   ? Arthritis   ? Cataract   ? forming  ? Diabetes mellitus without complication ([place] [address] ? GERD (gastroesophageal reflux disease)   ? Hyperlipidemia   ? Hypertension 2011  ? Morbid obesity ([name]   ? Neuromuscular disorder ([place]   ? issues with nerves in back   ? Osteopenia   ? Thyroid disease   ? ? ?Past Surgical History:  ?Procedure Laterality Date  ? ABDOMINAL HYSTERECTOMY    ? APPENDECTOMY    ? CHOLECYSTECTOMY    ? COLONOSCOPY  2009  ? [facility] - normal per pt   ? TONSILLECTOMY    ? ? ?  Current Medications: ?Current Meds  ?Medication Sig  ? Alpha Lipoic Acid 200 MG CAPS Take by mouth. Once a day  ? aspirin 81 MG tablet Take 81 mg by mouth daily.  ? benazepril (LOTENSIN) 5 MG tablet TAKE TWO (2) TABLETS BY MOUTH EVERY DAY (Patient taking differently: Take 10 mg by mouth daily.)  ? blood glucose meter kit and supplies Dispense based on patient and insurance preference. Once daily testing dx e11.9  ? calcium-vitamin D (OSCAL WITH D) 500-200 MG-UNIT per tablet Take 1 tablet by mouth daily.  ? cyclobenzaprine (FLEXERIL) 5 MG tablet TAKE ONE TABLET BY MOUTH AT BEDTIME AS NEEDED. SHOULD LAST SIX MONTHS. (Patient taking differently: Take 5 mg by mouth daily as needed for muscle spasms.)  ? Dexlansoprazole 30 MG capsule Take 30  mg by mouth daily.  ? fluticasone (FLONASE) 50 MCG/ACT nasal spray USE 2 SPRAYS IN EACH NOSTRIL ONCE DAILY.  SHAKE GENTLY BEFORE USING. (Patient taking differently: Place 2 sprays into both nostrils daily.)  ? furosemide (LASIX) 20 MG tablet TAKE ONE TABLET BY MOUTH ONCE DAILY FOR LEG SWELLING. (Patient taking differently: Take 20 mg by mouth daily as needed for fluid.)  ? gabapentin (NEURONTIN) 300 MG capsule TAKE ONE CAPSULE BY MOUTH TWICE DAILY  ? glipiZIDE (GLIPIZIDE XL) 10 MG 24 hr tablet Take 1 tablet (10 mg total) by mouth daily with breakfast.  ? glucose blood (ONETOUCH VERIO) test strip USE TO TEST BLOOD SUGAR EVERY DAY  ? ibuprofen (ADVIL) 800 MG tablet Take 1 tablet (800 mg total) by mouth every 8 (eight) hours as needed.  ? Lancets (ONETOUCH DELICA PLUS [id number]) MISC USE TO TEST BLOOD SUGAR ONCE DAILY  ? levothyroxine (SYNTHROID) 150 MCG tablet TAKE ONE TABLET BY MOUTH DAILY.  ? metFORMIN (GLUCOPHAGE) 1000 MG tablet TAKE ONE TABLET BY MOUTH TWICE DAILY WITH A MEAL  ? montelukast (SINGULAIR) 10 MG tablet TAKE ONE TABLET BY MOUTH AT BEDTIME  ? oxyCODONE-acetaminophen (PERCOCET) 7.5-325 MG tablet Take one tablet once daily , as needed , for uncontrolled back pain  ? potassium chloride SA (KLOR-CON M) 20 MEQ tablet TAKE ONE TABLET BY MOUTH EVERY DAY AS NEEDED ON DAYS YOU TAKE FUROSEMIDE FOR FOR SWELLING (Patient taking differently: Take 20 mEq by mouth See admin instructions. TAKE ONE TABLET BY MOUTH EVERY DAY AS NEEDED ON DAYS YOU TAKE FUROSEMIDE FOR FOR SWELLING)  ? rosuvastatin (CRESTOR) 40 MG tablet Take 1 tablet (40 mg total) by mouth daily.  ? traZODone (DESYREL) 100 MG tablet TAKE ONE TABLET BY MOUTH AT BEDTIME  ? Turmeric 500 MG CAPS Take 1 capsule by mouth daily.  ? venlafaxine XR (EFFEXOR XR) 37.5 MG 24 hr capsule Take 1 capsule (37.5 mg total) by mouth daily with breakfast.  ? ?Current Facility-Administered Medications for the 04/21/21 encounter (Office Visit) with Almyra Deforest, [place]  ?Medication  ?  Influenza (>/= 3 years) inactive virus vaccine (FLVIRIN/FLUZONE) injection SUSP 0.5 mL  ? methylPREDNISolone acetate (DEPO-MEDROL) injection 120 mg  ?  ? ?Allergies:   Ozempic (0.25 or 0.5 mg-dose) [semaglutide(0.25 or 0.67m-dos)], Naproxen sodium, Tsh [thyrotropin], and Statins  ? ?Social History  ? ?Socioeconomic History  ? Marital status: Married  ?  Spouse name: RLiliah Dorian ? Number of children: 2  ? Years of education: 138 ? Highest education level: 12th grade  ?Occupational History  ? Occupation: disabled   ?Tobacco Use  ? Smoking status: Never  ?  Passive exposure: Never  ? Smokeless tobacco: Never  ?Vaping  Use  ? Vaping Use: Never used  ?Substance and Sexual Activity  ? Alcohol use: No  ? Drug use: No  ? Sexual activity: Not Currently  ?Other Topics Concern  ? Not on file  ?Social History Narrative  ? Lives with Husband alone   ? ?Social Determinants of Health  ? ?Financial Resource Strain: Low Risk   ? Difficulty of Paying Living Expenses: Not hard at all  ?Food Insecurity: No Food Insecurity  ? Worried About Charity fundraiser in the Last Year: Never true  ? Ran Out of Food in the Last Year: Never true  ?Transportation Needs: No Transportation Needs  ? Lack of Transportation (Medical): No  ? Lack of Transportation (Non-Medical): No  ?Physical Activity: Insufficiently Active  ? Days of Exercise per Week: 4 days  ? Minutes of Exercise per Session: 30 min  ?Stress: Stress Concern Present  ? Feeling of Stress : Rather much  ?Social Connections: Moderately Integrated  ? Frequency of Communication with Friends and Family: More than three times a week  ? Frequency of Social Gatherings with Friends and Family: More than three times a week  ? Attends Religious Services: More than 4 times per year  ? Active Member of Clubs or Organizations: No  ? Attends Archivist Meetings: Never  ? Marital Status: Married  ?  ? ?Family History: ?The patient's family history includes Asthma in her mother; Cancer in  her maternal grandmother, paternal grandfather, sister, and sister; Diabetes in her father; Heart disease (age of onset: 81) in her mother; Heart disease (age of onset: 92) in her father; Hypertension in

## 2021-04-21 NOTE — Patient Instructions (Signed)
Medication Instructions:  ?Your physician recommends that you continue on your current medications as directed. Please refer to the Current Medication list given to you today.  ? ?*If you need a refill on your cardiac medications before your next appointment, please call your pharmacy* ? ? ?Lab Work: ?NONE ordered at this time of appointment  ? ?If you have labs (blood work) drawn today and your tests are completely normal, you will receive your results only by: ?MyChart Message (if you have MyChart) OR ?A paper copy in the mail ?If you have any lab test that is abnormal or we need to change your treatment, we will call you to review the results. ? ? ?Testing/Procedures: ?NONE ordered at this time of appointment  ? ? ? ?Follow-Up: ?At [facility], you and your health needs are our priority.  As part of our continuing mission to provide you with exceptional heart care, we have created designated Provider Care Teams.  These Care Teams include your primary Cardiologist (physician) and Advanced Practice Providers (APPs -  Physician Assistants and Nurse Practitioners) who all work together to provide you with the care you need, when you need it. ? ?We recommend signing up for the patient portal called "MyChart".  Sign up information is provided on this After Visit Summary.  MyChart is used to connect with patients for Virtual Visits (Telemedicine).  Patients are able to view lab/test results, encounter notes, upcoming appointments, etc.  Non-urgent messages can be sent to your provider as well.   ?To learn more about what you can do with MyChart, go to [email or web address].   ? ?Your next appointment:   ?3 month(s) ? ?The format for your next appointment:   ?In Person ? ?Provider:   ?Shelva Majestic, MD   ? ? ?Other Instructions ?Referral sent to GI  ? ?

## 2021-04-23 ENCOUNTER — Encounter: Payer: Self-pay | Admitting: Physician Assistant

## 2021-04-24 ENCOUNTER — Encounter: Payer: Self-pay | Admitting: Physician Assistant

## 2021-04-24 DIAGNOSIS — F321 Major depressive disorder, single episode, moderate: Secondary | ICD-10-CM

## 2021-04-24 DIAGNOSIS — F418 Other specified anxiety disorders: Secondary | ICD-10-CM

## 2021-04-24 DIAGNOSIS — I10 Essential (primary) hypertension: Secondary | ICD-10-CM

## 2021-04-24 DIAGNOSIS — E785 Hyperlipidemia, unspecified: Secondary | ICD-10-CM

## 2021-04-24 DIAGNOSIS — E1165 Type 2 diabetes mellitus with hyperglycemia: Secondary | ICD-10-CM

## 2021-04-29 ENCOUNTER — Encounter: Payer: Self-pay | Admitting: Physician Assistant

## 2021-04-29 ENCOUNTER — Ambulatory Visit: Payer: Medicare Other | Admitting: Physician Assistant

## 2021-04-29 VITALS — BP 114/74 | HR 101 | Ht 68.0 in | Wt 286.5 lb

## 2021-04-29 DIAGNOSIS — R1310 Dysphagia, unspecified: Secondary | ICD-10-CM | POA: Diagnosis not present

## 2021-04-29 DIAGNOSIS — K219 Gastro-esophageal reflux disease without esophagitis: Secondary | ICD-10-CM

## 2021-04-29 DIAGNOSIS — K2900 Acute gastritis without bleeding: Secondary | ICD-10-CM

## 2021-04-29 DIAGNOSIS — K209 Esophagitis, unspecified without bleeding: Secondary | ICD-10-CM | POA: Diagnosis not present

## 2021-04-29 DIAGNOSIS — R1084 Generalized abdominal pain: Secondary | ICD-10-CM

## 2021-04-29 MED ORDER — DEXLANSOPRAZOLE 30 MG PO CPDR: 30.0000 mg | DELAYED_RELEASE_CAPSULE | Freq: Two times a day (BID) | ORAL | 0 refills | Status: DC

## 2021-04-29 MED ORDER — ONDANSETRON HCL 4 MG PO TABS: 4.0000 mg | ORAL_TABLET | Freq: Four times a day (QID) | ORAL | 2 refills | Status: DC | PRN

## 2021-04-29 NOTE — Progress Notes (Signed)
? ?Subjective:  ? ? Patient ID: [name], female    DOB: 06/15/1955, 66 y.o.   MRN: [id number] ? ?HPI ?[name] is a 66 year old white female, established with Dr. Fuller Plan who was referred back today by Dr. Kelly/cardiology with multiple GI complaints, including nausea and dysphagia. ?He was last seen here in 2019 for screening colonoscopy.  She was found to have grade 1 internal hemorrhoids and had a 6 mm tubular adenoma removed.  She is indicated for 5-year interval follow-up. ? ?She has not had prior EGD. ?She says her current symptoms started around February 25 shortly after she had been started on Ozempic.  She became very nauseated and says this persisted over the following 3 weeks.  She was unable to eat much, did not have any vomiting but remained extremely nauseated.  She also became very constipated and was unable to have a bowel movement.  Due to severity of the symptoms Ozempic was stopped about 3 weeks ago.  She went to the emergency room on 04/18/2021 due to complaints of nausea abdominal pain which was rather generalized, also with complaints of dysphagia and sensation of her food getting stuck.  She says she did not have a bowel movement for almost 3 weeks. ?She had CT imaging done which showed a fatty liver, status postcholecystectomy, small hiatal hernia and possible mesenteric panniculitis involving the left mid abdomen. ?Labs were done and have been reviewed today, no evidence of pancreatitis and LFTs normal. ?She was not given any medications. ?She has remained off Ozempic.  She continues to feel poorly, denies any heartburn but still does not have much of an appetite.  She is still having issues with solid food dysphagia and has to drink a lot of fluids to get the food to go down, no regurgitation. ?No regular NSAIDs, no EtOH.  She says she feels more discomfort in the right side of her abdomen and has had a knot sensation in the lower chest.  She was finally able to have a bowel movement  yesterday. ? ?Review of Systems  Pertinent positive and negative review of systems were noted in the above HPI section.  All other review of systems was otherwise negative.  ? ?Outpatient Encounter Medications as of 04/29/2021  ?Medication Sig  ? Alpha Lipoic Acid 200 MG CAPS Take by mouth. Once a day  ? aspirin 81 MG tablet Take 81 mg by mouth daily.  ? benazepril (LOTENSIN) 5 MG tablet TAKE TWO (2) TABLETS BY MOUTH EVERY DAY (Patient taking differently: Take 10 mg by mouth daily.)  ? blood glucose meter kit and supplies Dispense based on patient and insurance preference. Once daily testing dx e11.9  ? calcium-vitamin D (OSCAL WITH D) 500-200 MG-UNIT per tablet Take 1 tablet by mouth daily.  ? cyclobenzaprine (FLEXERIL) 5 MG tablet TAKE ONE TABLET BY MOUTH AT BEDTIME AS NEEDED. SHOULD LAST SIX MONTHS. (Patient taking differently: Take 5 mg by mouth daily as needed for muscle spasms.)  ? fluticasone (FLONASE) 50 MCG/ACT nasal spray USE 2 SPRAYS IN EACH NOSTRIL ONCE DAILY.  SHAKE GENTLY BEFORE USING. (Patient taking differently: Place 2 sprays into both nostrils daily.)  ? furosemide (LASIX) 20 MG tablet TAKE ONE TABLET BY MOUTH ONCE DAILY FOR LEG SWELLING. (Patient taking differently: Take 20 mg by mouth daily as needed for fluid.)  ? gabapentin (NEURONTIN) 300 MG capsule TAKE ONE CAPSULE BY MOUTH TWICE DAILY  ? glipiZIDE (GLIPIZIDE XL) 10 MG 24 hr tablet Take 1 tablet (10 mg  total) by mouth daily with breakfast.  ? glucose blood (ONETOUCH VERIO) test strip USE TO TEST BLOOD SUGAR EVERY DAY  ? ibuprofen (ADVIL) 800 MG tablet Take 1 tablet (800 mg total) by mouth every 8 (eight) hours as needed.  ? Lancets (ONETOUCH DELICA PLUS [id number]) MISC USE TO TEST BLOOD SUGAR ONCE DAILY  ? levothyroxine (SYNTHROID) 150 MCG tablet TAKE ONE TABLET BY MOUTH DAILY.  ? metFORMIN (GLUCOPHAGE) 1000 MG tablet TAKE ONE TABLET BY MOUTH TWICE DAILY WITH A MEAL  ? montelukast (SINGULAIR) 10 MG tablet TAKE ONE TABLET BY MOUTH AT BEDTIME  ?  ondansetron (ZOFRAN) 4 MG tablet Take 1 tablet (4 mg total) by mouth every 6 (six) hours as needed for nausea or vomiting.  ? oxyCODONE-acetaminophen (PERCOCET) 7.5-325 MG tablet Take one tablet once daily , as needed , for uncontrolled back pain  ? potassium chloride SA (KLOR-CON M) 20 MEQ tablet TAKE ONE TABLET BY MOUTH EVERY DAY AS NEEDED ON DAYS YOU TAKE FUROSEMIDE FOR FOR SWELLING (Patient taking differently: Take 20 mEq by mouth See admin instructions. TAKE ONE TABLET BY MOUTH EVERY DAY AS NEEDED ON DAYS YOU TAKE FUROSEMIDE FOR FOR SWELLING)  ? rosuvastatin (CRESTOR) 40 MG tablet Take 1 tablet (40 mg total) by mouth daily.  ? traZODone (DESYREL) 100 MG tablet TAKE ONE TABLET BY MOUTH AT BEDTIME  ? Turmeric 500 MG CAPS Take 1 capsule by mouth daily.  ? venlafaxine XR (EFFEXOR XR) 37.5 MG 24 hr capsule Take 1 capsule (37.5 mg total) by mouth daily with breakfast.  ? [DISCONTINUED] Dexlansoprazole 30 MG capsule Take 30 mg by mouth daily.  ? Dexlansoprazole 30 MG capsule DR Take 1 capsule (30 mg total) by mouth 2 (two) times daily before a meal.  ? ?Facility-Administered Encounter Medications as of 04/29/2021  ?Medication  ? Influenza (>/= 3 years) inactive virus vaccine (FLVIRIN/FLUZONE) injection SUSP 0.5 mL  ? methylPREDNISolone acetate (DEPO-MEDROL) injection 120 mg  ? ?Allergies  ?Allergen Reactions  ? Ozempic (0.25 Or 0.5 Mg-Dose) [Semaglutide(0.25 Or 0.8m-Dos)] Nausea Only  ?  dizzy  ? Naproxen Sodium Hives and Swelling  ?  MD thought it was the pink dye in the naproxen  ? Tsh [Thyrotropin]   ? Statins Other (See Comments)  ?  Reports increased appetite and possible muscle ache; tolerating rosuvastatin 40 mg daily  ? ?Patient Active Problem List  ? Diagnosis Date Noted  ? Depression, major, single episode, moderate ([facility] 03/19/2021  ? Right knee pain 05/07/2020  ? Strain of muscle, fascia and tendon of lower back, initial encounter 03/21/2020  ? Statin intolerance 07/24/2017  ? Chronic pain 11/24/2016  ?  Snoring 07/20/2016  ? Carotid bruit present 11/10/2015  ? Right hip pain 10/25/2014  ? Annual physical exam 08/28/2014  ? Metabolic syndrome X 090/24/0973 ? Cervical neck pain with evidence of disc disease 10/19/2010  ? Type 2 diabetes mellitus with hyperglycemia ([place] 01/22/2010  ? Hypothyroidism 06/08/2007  ? Hyperlipidemia 06/08/2007  ? Morbid obesity ([facility] 06/08/2007  ? Depression with anxiety 06/08/2007  ? Hypertension goal BP (blood pressure) < 130/80 06/08/2007  ? GERD 06/08/2007  ? Back pain with radiculopathy 06/08/2007  ? ?Social History  ? ?Socioeconomic History  ? Marital status: Married  ?  Spouse name: RTrenise Turay ? Number of children: 2  ? Years of education: 128 ? Highest education level: 12th grade  ?Occupational History  ? Occupation: disabled   ?Tobacco Use  ? Smoking status: Never  ?  Passive exposure: Never  ? Smokeless tobacco: Never  ?Vaping Use  ? Vaping Use: Never used  ?Substance and Sexual Activity  ? Alcohol use: No  ? Drug use: No  ? Sexual activity: Not Currently  ?Other Topics Concern  ? Not on file  ?Social History Narrative  ? Lives with Husband alone   ? ?Social Determinants of Health  ? ?Financial Resource Strain: Low Risk   ? Difficulty of Paying Living Expenses: Not hard at all  ?Food Insecurity: No Food Insecurity  ? Worried About Charity fundraiser in the Last Year: Never true  ? Ran Out of Food in the Last Year: Never true  ?Transportation Needs: No Transportation Needs  ? Lack of Transportation (Medical): No  ? Lack of Transportation (Non-Medical): No  ?Physical Activity: Insufficiently Active  ? Days of Exercise per Week: 4 days  ? Minutes of Exercise per Session: 30 min  ?Stress: Stress Concern Present  ? Feeling of Stress : Rather much  ?Social Connections: Moderately Integrated  ? Frequency of Communication with Friends and Family: More than three times a week  ? Frequency of Social Gatherings with Friends and Family: More than three times a week  ? Attends Religious  Services: More than 4 times per year  ? Active Member of Clubs or Organizations: No  ? Attends Archivist Meetings: Never  ? Marital Status: Married  ?Intimate Partner Violence: Not At Risk  ? Fear o

## 2021-04-29 NOTE — Patient Instructions (Addendum)
If you are age 66 or older, your body mass index should be between 23-30. Your Body mass index is 43.56 kg/m?Marland Kitchen If this is out of the aforementioned range listed, please consider follow up with your Primary Care Provider. ?________________________________________________________ ? ?The [facility] providers would like to encourage you to use [facility] to communicate with providers for non-urgent requests or questions.  Due to long hold times on the telephone, sending your provider a message by [facility] may be a faster and more efficient way to get a response.  Please allow 48 business hours for a response.  Please remember that this is for non-urgent requests.  ?_______________________________________________________ ? ?You have been scheduled for an endoscopy. Please follow written instructions given to you at your visit today. ?If you use inhalers (even only as needed), please bring them with you on the day of your procedure. ? ?Increase your Dexilant to 30 mg twice daily before meals for 1 month, then go back to once daily ? ?START Ondansetron 4 mg 1 tablet every 6 hours as needed ? ?START Miralax 1 capful in 8 ounces of water or juice once or twice daily until bowel movements as back to normal. ? ?Follow a bland diet ? ?Follow up pending  ? ?Thank you for entrusting me with your care and choosing [facility]. ? ?Nicoletta Ba, PA-C ? ?Bland Diet ?A bland diet consists of foods that are often soft and do not have a lot of fat, fiber, or extra seasonings. Foods without fat, fiber, or seasoning are easier for the body to digest. They are also less likely to irritate your mouth, throat, stomach, and other parts of your digestive system. A bland diet is sometimes called a BRAT diet. ?What is my plan? ?Your health care provider or food and nutrition specialist (dietitian) may recommend specific changes to your diet to prevent symptoms or to treat your symptoms. These changes may include: ?Eating small meals  often. ?Cooking food until it is soft enough to chew easily. ?Chewing your food well. ?Drinking fluids slowly. ?Not eating foods that are very spicy, sour, or fatty. ?Not eating citrus fruits, such as oranges and grapefruit. ?What do I need to know about this diet? ?Eat a variety of foods from the bland diet food list. ?Do not follow a bland diet longer than needed. ?Ask your health care provider whether you should take vitamins or supplements. ?What foods can I eat? ?Grains ?Hot cereals, such as cream of wheat. Rice. Bread, crackers, or tortillas made from refined white flour. ?Vegetables ?Canned or cooked vegetables. Mashed or boiled potatoes. ?Fruits ?Bananas. Applesauce. Other types of cooked or canned fruit with the skin and seeds removed, such as canned peaches or pears. ?Meats and other proteins ?Scrambled eggs. Creamy peanut butter or other nut butters. Lean, well-cooked meats, such as chicken or fish. Tofu. Soups or broths. ?Dairy ?Low-fat dairy products, such as milk, cottage cheese, or yogurt. ?Beverages ?Water. Herbal tea. Apple juice. ?Fats and oils ?Mild salad dressings. Canola or olive oil. ?Sweets and desserts ?Pudding. Custard. Fruit gelatin. Ice cream. ?The items listed above may not be a complete list of recommended foods and beverages. Contact a dietitian for more options. ?What foods are not recommended? ?Grains ?Whole grain breads and cereals. ?Vegetables ?Raw vegetables. ?Fruits ?Raw fruits, especially citrus, berries, or dried fruits. ?Dairy ?Whole fat dairy foods. ?Beverages ?Caffeinated drinks. Alcohol. ?Seasonings and condiments ?Strongly flavored seasonings or condiments. Hot sauce. Salsa. ?Other foods ?Spicy foods. Fried foods. Sour foods, such  as pickled or fermented foods. Foods with high sugar content. Foods high in fiber. ?The items listed above may not be a complete list of foods and beverages to avoid. Contact a dietitian for more information. ?Summary ?A bland diet consists of  foods that are often soft and do not have a lot of fat, fiber, or extra seasonings. ?Foods without fat, fiber, or seasoning are easier for the body to digest. ?Check with your health care provider to see how long you should follow this diet plan. It is not meant to be followed for long periods. ?This information is not intended to replace advice given to you by your health care provider. Make sure you discuss any questions you have with your health care provider. ?Document Revised: 02/09/2017 Document Reviewed: 02/09/2017 ?Elsevier Patient Education ? 2022 [place]. ? ? ? ?

## 2021-05-04 ENCOUNTER — Telehealth: Payer: Self-pay

## 2021-05-04 NOTE — Telephone Encounter (Signed)
Prior authorization started on Dexlansoprazole ?

## 2021-05-05 NOTE — Telephone Encounter (Signed)
[name] from [facility] called to advise Dexlansoprazole has been approved  ?05/04/2021-05/05/2022.  ?

## 2021-05-13 ENCOUNTER — Ambulatory Visit: Payer: Medicare Other | Admitting: Family Medicine

## 2021-05-13 ENCOUNTER — Encounter: Payer: Self-pay | Admitting: Family Medicine

## 2021-05-13 ENCOUNTER — Ambulatory Visit: Payer: Medicare Other

## 2021-05-13 VITALS — BP 108/68 | HR 87 | Ht 68.0 in | Wt 284.0 lb

## 2021-05-13 DIAGNOSIS — I10 Essential (primary) hypertension: Secondary | ICD-10-CM

## 2021-05-13 DIAGNOSIS — E1165 Type 2 diabetes mellitus with hyperglycemia: Secondary | ICD-10-CM

## 2021-05-13 DIAGNOSIS — E039 Hypothyroidism, unspecified: Secondary | ICD-10-CM

## 2021-05-13 DIAGNOSIS — F321 Major depressive disorder, single episode, moderate: Secondary | ICD-10-CM

## 2021-05-13 DIAGNOSIS — E785 Hyperlipidemia, unspecified: Secondary | ICD-10-CM

## 2021-05-13 MED ORDER — BENAZEPRIL HCL 5 MG PO TABS: 5.0000 mg | ORAL_TABLET | Freq: Every day | ORAL | 3 refills | Status: DC

## 2021-05-13 NOTE — Patient Instructions (Signed)
F/U last week in May of first week in June call if you need me sooner. ? ?Reduce benazepril from 5 mg to 1 tablet daily. ? ?Stop furosemide and potassium. ? ?Please count carbohydrates and stay with maximum of 30 g of carbohydrate per meal. ? ?Fasting lipid CMP and EGFR and HbA1c May 25 or after. ? ?All the best with upper endoscopy. ? ?Congratulations on weight loss.  Continue regular exercise and reduction in caloric intake ?\ ?Thanks for choosing [facility], we consider it a privelige to serve you. ? ?

## 2021-05-15 DIAGNOSIS — G894 Chronic pain syndrome: Secondary | ICD-10-CM | POA: Diagnosis not present

## 2021-05-15 DIAGNOSIS — M47812 Spondylosis without myelopathy or radiculopathy, cervical region: Secondary | ICD-10-CM | POA: Diagnosis not present

## 2021-05-15 DIAGNOSIS — M150 Primary generalized (osteo)arthritis: Secondary | ICD-10-CM | POA: Diagnosis not present

## 2021-05-15 DIAGNOSIS — M47816 Spondylosis without myelopathy or radiculopathy, lumbar region: Secondary | ICD-10-CM | POA: Diagnosis not present

## 2021-05-17 ENCOUNTER — Encounter: Payer: Self-pay | Admitting: Family Medicine

## 2021-05-17 NOTE — Progress Notes (Signed)
? ?[name]     MRN: [id number]      DOB: Aug 16, 1955 ? ? ?HPI ?Ms. Gilchrest is here for follow up and re-evaluation of chronic medical conditions, medication management and review of any available recent lab and radiology data.  ?Preventive health is updated, specifically  Cancer screening and Immunization.   ?Recent ED visit with abdominal pain reviewed, p reports severe nausea and loss of appetite with ozempic, has stopped and does not want the med, stated she had pancreatitis , has upsoming GI study as follow up ?Improved mental health as son is to start treatmen for his prostate cancer in the morning  ?C/o light headedness and blood pressure is low when checked , espescially with recent weight loss med reduction appropriate ? ?ROS ?Denies recent fever or chills. ?Denies sinus pressure, nasal congestion, ear pain or sore throat. ?Denies chest congestion, productive cough or wheezing. ?Denies chest pains, palpitations and leg swelling ?Marland Kitchen   ?Denies dysuria, frequency, hesitancy or incontinence. ?Denies uncontrolled  joint pain, swelling and limitation in mobility. ?Denies headaches, seizures, numbness, or tingling. ?Denies  uncontrolled depression, anxiety or insomnia. ?Denies skin break down or rash. ? ? ?PE ? ?BP 108/68   Pulse 87   Ht '5\' 8"'$  (1.727 m)   Wt 284 lb 0.6 oz (128.8 kg)   SpO2 92%   BMI 43.19 kg/m?  ? ?Patient alert and oriented and in no cardiopulmonary distress. ? ?HEENT: No facial asymmetry, EOMI,     Neck supple . ? ?Chest: Clear to auscultation bilaterally. ? ?CVS: S1, S2 no murmurs, no S3.Regular rate. ? ?ABD: Soft non tender.  ? ?Ext: No edema ? ?MS: Adequate  though reduced ROM spine, shoulders, hips and knees. ? ?Skin: Intact, no ulcerations or rash noted. ? ?Psych: Good eye contact, normal affect. Memory intact not anxious or depressed appearing. ? ?CNS: CN 2-12 intact, power,  normal throughout.no focal deficits noted. ? ? ?Assessment & Plan ? ?Hypertension goal BP (blood pressure) <  130/80 ?DASH diet and commitment to daily physical activity for a minimum of 30 minutes discussed and encouraged, as a part of hypertension management. ?The importance of attaining a healthy weight is also discussed. ?Light headed on current dose reduce by 50 % and re eval in 6 weeks ? ? ?  05/13/2021  ?  8:07 AM 04/29/2021  ?  2:16 PM 04/21/2021  ?  3:19 PM 04/18/2021  ?  8:30 PM 04/18/2021  ?  7:00 PM 04/18/2021  ?  6:30 PM 04/18/2021  ?  6:00 PM  ?BP/Weight  ?Systolic BP 465 681 275 170 115 113 124  ?Diastolic BP 68 74 74 59 65 63 70  ?Wt. (Lbs) 284.04 286.5 286      ?BMI 43.19 kg/m2 43.56 kg/m2 43.49 kg/m2      ? ? ? ? ? ?Hypothyroidism ?Controlled, no change in medication ? ? ?Morbid obesity ? ?Patient re-educated about  the importance of commitment to a  minimum of 150 minutes of exercise per week as able. ? ?The importance of healthy food choices with portion control discussed, as well as eating regularly and within a 12 hour window most days. ?The need to choose "clean , green" food 50 to 75% of the time is discussed, as well as to make water the primary drink and set a goal of 64 ounces water daily. ? ?  ? ?  05/13/2021  ?  8:07 AM 04/29/2021  ?  2:16 PM 04/21/2021  ?  3:19 PM  ?Weight /BMI  ?Weight 284 lb 0.6 oz 286 lb 8 oz 286 lb  ?Height '5\' 8"'$  (1.727 m) '5\' 8"'$  (1.727 m) '5\' 8"'$  (1.727 m)  ?BMI 43.19 kg/m2 43.56 kg/m2 43.49 kg/m2  ? ? ? ? ?Depression, major, single episode, moderate ([place]) ?Controlled, no change in medication ? ? ?Type 2 diabetes mellitus with hyperglycemia (HCC) ?Ms. Aries is reminded of the importance of commitment to daily physical activity for 30 minutes or more, as able and the need to limit carbohydrate intake to 30 to 60 grams per meal to help with blood sugar control.  ? ?The need to take medication as prescribed, test blood sugar as directed, and to call between visits if there is a concern that blood sugar is uncontrolled is also discussed.  ? ?Ms. Memon is reminded of the importance of daily  foot exam, annual eye examination, and good blood sugar, blood pressure and cholesterol control. ?Uncontrolled, updated lab for net visit ? ? ?  Latest Ref Rng & Units 04/18/2021  ?  5:07 PM 03/13/2021  ?  8:03 AM 11/11/2020  ?  9:22 AM 08/07/2020  ?  9:57 AM 05/02/2020  ?  8:16 AM  ?Diabetic Labs  ?HbA1c 4.8 - 5.6 %  7.8   7.7    8.4    ?Micro/Creat Ratio 0 - 29 mg/g creat    <6     ?Chol 100 - 199 mg/dL   128    142    ?HDL >39 mg/dL   49    52    ?Calc LDL 0 - 99 mg/dL   54    64    ?Triglycerides 0 - 149 mg/dL   145    153    ?Creatinine 0.44 - 1.00 mg/dL 0.95   0.92   0.90    1.03    ? ? ?  05/13/2021  ?  8:07 AM 04/29/2021  ?  2:16 PM 04/21/2021  ?  3:19 PM 04/18/2021  ?  8:30 PM 04/18/2021  ?  7:00 PM 04/18/2021  ?  6:30 PM 04/18/2021  ?  6:00 PM  ?BP/Weight  ?Systolic BP 409 811 914 782 115 113 124  ?Diastolic BP 68 74 74 59 65 63 70  ?Wt. (Lbs) 284.04 286.5 286      ?BMI 43.19 kg/m2 43.56 kg/m2 43.49 kg/m2      ? ? ?  Latest Ref Rng & Units 01/09/2021  ? 10:00 AM 11/06/2020  ? 12:00 AM  ?Foot/eye exam completion dates  ?Eye Exam No Retinopathy  No Retinopathy       ?Foot Form Completion  Done   ?  ? This result is from an external source.  ? ? ? ? ? ? ?Hyperlipidemia ?Hyperlipidemia:Low fat diet discussed and encouraged. ? ? ?Lipid Panel  ?Lab Results  ?Component Value Date  ? CHOL 128 11/11/2020  ? HDL 49 11/11/2020  ? [place] 54 11/11/2020  ? TRIG 145 11/11/2020  ? CHOLHDL 2.6 11/11/2020  ? ? ? ?Updated lab needed at/ before next visit. ? ? ?

## 2021-05-17 NOTE — Assessment & Plan Note (Signed)
Hyperlipidemia:Low fat diet discussed and encouraged. ? ? ?Lipid Panel  ?Lab Results  ?Component Value Date  ? CHOL 128 11/11/2020  ? HDL 49 11/11/2020  ? [place] 54 11/11/2020  ? TRIG 145 11/11/2020  ? CHOLHDL 2.6 11/11/2020  ? ? ? ?Updated lab needed at/ before next visit. ? ?

## 2021-05-17 NOTE — Assessment & Plan Note (Signed)
?  Patient re-educated about  the importance of commitment to a  minimum of 150 minutes of exercise per week as able. ? ?The importance of healthy food choices with portion control discussed, as well as eating regularly and within a 12 hour window most days. ?The need to choose "clean , green" food 50 to 75% of the time is discussed, as well as to make water the primary drink and set a goal of 64 ounces water daily. ? ?  ? ?  05/13/2021  ?  8:07 AM 04/29/2021  ?  2:16 PM 04/21/2021  ?  3:19 PM  ?Weight /BMI  ?Weight 284 lb 0.6 oz 286 lb 8 oz 286 lb  ?Height '5\' 8"'$  (1.727 m) '5\' 8"'$  (1.727 m) '5\' 8"'$  (1.727 m)  ?BMI 43.19 kg/m2 43.56 kg/m2 43.49 kg/m2  ? ? ? ?

## 2021-05-17 NOTE — Assessment & Plan Note (Signed)
Controlled, no change in medication  

## 2021-05-17 NOTE — Assessment & Plan Note (Signed)
DASH diet and commitment to daily physical activity for a minimum of 30 minutes discussed and encouraged, as a part of hypertension management. ?The importance of attaining a healthy weight is also discussed. ?Light headed on current dose reduce by 50 % and re eval in 6 weeks ? ? ?  05/13/2021  ?  8:07 AM 04/29/2021  ?  2:16 PM 04/21/2021  ?  3:19 PM 04/18/2021  ?  8:30 PM 04/18/2021  ?  7:00 PM 04/18/2021  ?  6:30 PM 04/18/2021  ?  6:00 PM  ?BP/Weight  ?Systolic BP 427 670 110 034 115 113 124  ?Diastolic BP 68 74 74 59 65 63 70  ?Wt. (Lbs) 284.04 286.5 286      ?BMI 43.19 kg/m2 43.56 kg/m2 43.49 kg/m2      ? ? ? ? ?

## 2021-05-17 NOTE — Assessment & Plan Note (Signed)
[name] is reminded of the importance of commitment to daily physical activity for 30 minutes or more, as able and the need to limit carbohydrate intake to 30 to 60 grams per meal to help with blood sugar control.  ? ?The need to take medication as prescribed, test blood sugar as directed, and to call between visits if there is a concern that blood sugar is uncontrolled is also discussed.  ? ?[name] is reminded of the importance of daily foot exam, annual eye examination, and good blood sugar, blood pressure and cholesterol control. ?Uncontrolled, updated lab for net visit ? ? ?  Latest Ref Rng & Units 04/18/2021  ?  5:07 PM 03/13/2021  ?  8:03 AM 11/11/2020  ?  9:22 AM 08/07/2020  ?  9:57 AM 05/02/2020  ?  8:16 AM  ?Diabetic Labs  ?HbA1c 4.8 - 5.6 %  7.8   7.7    8.4    ?Micro/Creat Ratio 0 - 29 mg/g creat    <6     ?Chol 100 - 199 mg/dL   128    142    ?HDL >39 mg/dL   49    52    ?Calc LDL 0 - 99 mg/dL   54    64    ?Triglycerides 0 - 149 mg/dL   145    153    ?Creatinine 0.44 - 1.00 mg/dL 0.95   0.92   0.90    1.03    ? ? ?  05/13/2021  ?  8:07 AM 04/29/2021  ?  2:16 PM 04/21/2021  ?  3:19 PM 04/18/2021  ?  8:30 PM 04/18/2021  ?  7:00 PM 04/18/2021  ?  6:30 PM 04/18/2021  ?  6:00 PM  ?BP/Weight  ?Systolic BP 468 032 122 482 115 113 124  ?Diastolic BP 68 74 74 59 65 63 70  ?Wt. (Lbs) 284.04 286.5 286      ?BMI 43.19 kg/m2 43.56 kg/m2 43.49 kg/m2      ? ? ?  Latest Ref Rng & Units 01/09/2021  ? 10:00 AM 11/06/2020  ? 12:00 AM  ?Foot/eye exam completion dates  ?Eye Exam No Retinopathy  No Retinopathy       ?Foot Form Completion  Done   ?  ? This result is from an external source.  ? ? ? ? ? ?

## 2021-05-19 ENCOUNTER — Ambulatory Visit (INDEPENDENT_AMBULATORY_CARE_PROVIDER_SITE_OTHER): Payer: Medicare Other | Admitting: *Deleted

## 2021-05-19 DIAGNOSIS — I10 Essential (primary) hypertension: Secondary | ICD-10-CM

## 2021-05-19 DIAGNOSIS — E1165 Type 2 diabetes mellitus with hyperglycemia: Secondary | ICD-10-CM

## 2021-05-19 NOTE — Chronic Care Management (AMB) (Signed)
?Chronic Care Management  ? ?CCM RN Visit Note ? ?05/19/2021 ?Name: [name] MRN: [id number] DOB: 20-Jul-1955 ? ?Subjective: ?[name] is a 66 y.o. year old female who is a primary care patient of Moshe Cipro Norwood Levo, MD. The care management team was consulted for assistance with disease management and care coordination needs.   ? ?Engaged with patient by telephone for initial visit in response to provider referral for case management and/or care coordination services.  ? ?Consent to Services:  ?The patient was given the following information about Chronic Care Management services today, agreed to services, and gave verbal consent: 1. CCM service includes personalized support from designated clinical staff supervised by the primary care provider, including individualized plan of care and coordination with other care providers 2. 24/7 contact phone numbers for assistance for urgent and routine care needs. 3. Service will only be billed when office clinical staff spend 20 minutes or more in a month to coordinate care. 4. Only one practitioner may furnish and bill the service in a calendar month. 5.The patient may stop CCM services at any time (effective at the end of the month) by phone call to the office staff. 6. The patient will be responsible for cost sharing (co-pay) of up to 20% of the service fee (after annual deductible is met). Patient agreed to services and consent obtained. ? ?Patient agreed to services and verbal consent obtained.  ? ?Assessment: Review of patient past medical history, allergies, medications, health status, including review of consultants reports, laboratory and other test data, was performed as part of comprehensive evaluation and provision of chronic care management services.  ? ?SDOH (Social Determinants of Health) assessments and interventions performed:  ?SDOH Interventions   ? ?Flowsheet Row Most Recent Value  ?SDOH Interventions   ?Food Insecurity Interventions Intervention  Not Indicated  ?Transportation Interventions Intervention Not Indicated  ? ?  ?  ? ?[place] ? ?Allergies  ?Allergen Reactions  ? Ozempic (0.25 Or 0.5 Mg-Dose) [Semaglutide(0.25 Or 0.47m-Dos)] Nausea Only  ?  dizzy  ? Naproxen Sodium Hives and Swelling  ?  MD thought it was the pink dye in the naproxen  ? Tsh [Thyrotropin]   ? Statins Other (See Comments)  ?  Reports increased appetite and possible muscle ache; tolerating rosuvastatin 40 mg daily  ? ? ?Outpatient Encounter Medications as of 05/19/2021  ?Medication Sig Note  ? aspirin 81 MG tablet Take 81 mg by mouth daily.   ? benazepril (LOTENSIN) 5 MG tablet Take 1 tablet (5 mg total) by mouth daily.   ? blood glucose meter kit and supplies Dispense based on patient and insurance preference. Once daily testing dx e11.9   ? calcium-vitamin D (OSCAL WITH D) 500-200 MG-UNIT per tablet Take 1 tablet by mouth daily.   ? cyclobenzaprine (FLEXERIL) 5 MG tablet TAKE ONE TABLET BY MOUTH AT BEDTIME AS NEEDED. SHOULD LAST SIX MONTHS. (Patient taking differently: Take 5 mg by mouth daily as needed for muscle spasms.) 04/25/2017: Takes on avg 2 to 3 times per week  ? Dexlansoprazole 30 MG capsule DR Take 1 capsule (30 mg total) by mouth 2 (two) times daily before a meal.   ? fluticasone (FLONASE) 50 MCG/ACT nasal spray USE 2 SPRAYS IN EACH NOSTRIL ONCE DAILY.  SHAKE GENTLY BEFORE USING. (Patient taking differently: Place 2 sprays into both nostrils daily.)   ? gabapentin (NEURONTIN) 300 MG capsule TAKE ONE CAPSULE BY MOUTH TWICE DAILY   ? glipiZIDE (GLIPIZIDE XL) 10 MG  24 hr tablet Take 1 tablet (10 mg total) by mouth daily with breakfast.   ? glucose blood (ONETOUCH VERIO) test strip USE TO TEST BLOOD SUGAR EVERY DAY   ? Lancets (ONETOUCH DELICA PLUS [id number]) MISC USE TO TEST BLOOD SUGAR ONCE DAILY   ? levothyroxine (SYNTHROID) 150 MCG tablet TAKE ONE TABLET BY MOUTH DAILY.   ? metFORMIN (GLUCOPHAGE) 1000 MG tablet TAKE ONE TABLET BY MOUTH TWICE DAILY WITH A MEAL   ?  montelukast (SINGULAIR) 10 MG tablet TAKE ONE TABLET BY MOUTH AT BEDTIME   ? ondansetron (ZOFRAN) 4 MG tablet Take 1 tablet (4 mg total) by mouth every 6 (six) hours as needed for nausea or vomiting.   ? oxyCODONE-acetaminophen (PERCOCET) 7.5-325 MG tablet Take one tablet once daily , as needed , for uncontrolled back pain   ? rosuvastatin (CRESTOR) 40 MG tablet Take 1 tablet (40 mg total) by mouth daily.   ? traZODone (DESYREL) 100 MG tablet TAKE ONE TABLET BY MOUTH AT BEDTIME   ? Turmeric 500 MG CAPS Take 1 capsule by mouth daily.   ? venlafaxine XR (EFFEXOR XR) 37.5 MG 24 hr capsule Take 1 capsule (37.5 mg total) by mouth daily with breakfast.   ? ?Facility-Administered Encounter Medications as of 05/19/2021  ?Medication  ? Influenza (>/= 3 years) inactive virus vaccine (FLVIRIN/FLUZONE) injection SUSP 0.5 mL  ? methylPREDNISolone acetate (DEPO-MEDROL) injection 120 mg  ? ? ?Patient Active Problem List  ? Diagnosis Date Noted  ? Depression, major, single episode, moderate ([place]) 03/19/2021  ? Right knee pain 05/07/2020  ? Strain of muscle, fascia and tendon of lower back, initial encounter 03/21/2020  ? Statin intolerance 07/24/2017  ? Chronic pain 11/24/2016  ? Snoring 07/20/2016  ? Carotid bruit present 11/10/2015  ? Right hip pain 10/25/2014  ? Metabolic syndrome X 43/15/4008  ? Cervical neck pain with evidence of disc disease 10/19/2010  ? Type 2 diabetes mellitus with hyperglycemia ([place]) 01/22/2010  ? Hypothyroidism 06/08/2007  ? Hyperlipidemia 06/08/2007  ? Morbid obesity ([place]) 06/08/2007  ? Depression with anxiety 06/08/2007  ? Hypertension goal BP (blood pressure) < 130/80 06/08/2007  ? GERD 06/08/2007  ? Back pain with radiculopathy 06/08/2007  ? ? ?Conditions to be addressed/monitored:HTN and DMII ? ?Care Plan : RN Care Manager Plan of Care  ?Updates made by Kassie Mends, RN since 05/19/2021 12:00 AM  ?  ? ?Problem: No plan of care established for management of chronic disease state  (DM2, HTN)    ?Priority: High  ?  ? ?Long-Range Goal: Development of plan of care for chronic disease management  (DM2, HTN)   ?Start Date: 05/19/2021  ?Expected End Date: 11/15/2021  ?Priority: High  ?Note:   ?Current Barriers:  ?Knowledge Deficits related to plan of care for management of HTN and DMII  ?Patient reports she lives alone, is independent with ADL, IADL's, still drives, does not have blood pressure cuff but is able to obtain one, checks CBG once daily with ranges 112-140, uses stationary bicycle for exercise and plans to go back [facility] to [facility].  Patient has chronic back pain, sees specialist Dr. Hardin Negus, LCSW has closed case. ? ?RNCM Clinical Goal(s):  ?Patient will verbalize understanding of plan for management of HTN and DMII as evidenced by patient report, review of EHR and  through collaboration with RN Care manager, provider, and care team.  ? ?Interventions: ?1:1 collaboration with primary care provider regarding development and update of comprehensive plan of care as evidenced  by provider attestation and co-signature ?Inter-disciplinary care team collaboration (see longitudinal plan of care) ?Evaluation of current treatment plan related to  self management and patient's adherence to plan as established by provider ? ? ?Diabetes Interventions:  (Status:  New goal. and Goal on track:  Yes.) Long Term Goal ?Assessed patient's understanding of A1c goal: <7% ?Provided education to patient about basic DM disease process ?Reviewed medications with patient and discussed importance of medication adherence ?Counseled on importance of regular laboratory monitoring as prescribed ?Discussed plans with patient for ongoing care management follow up and provided patient with direct contact information for care management team ?Provided patient with written educational materials related to hypo and hyperglycemia and importance of correct treatment ?Review of patient status, including review of consultants reports, relevant  laboratory and other test results, and medications completed ?Screening for signs and symptoms of depression related to chronic disease state  ?Assessed social determinant of health barriers ?Reviewed carbohydr

## 2021-05-19 NOTE — Patient Instructions (Signed)
Visit Information  ? ?Thank you for taking time to visit with me today. Please don't hesitate to contact me if I can be of assistance to you before our next scheduled telephone appointment. ? ?Following are the goals we discussed today:  ?Take medications as prescribed   ?Attend all scheduled provider appointments ?Call pharmacy for medication refills 3-7 days in advance of running out of medications ?Perform all self care activities independently  ?Perform IADL's (shopping, preparing meals, housekeeping, managing finances) independently ?Call provider office for new concerns or questions  ?check blood sugar at prescribed times: once daily ?enter blood sugar readings and medication or insulin into daily log ?take the blood sugar log to all doctor visits ?take the blood sugar meter to all doctor visits ?trim toenails straight across ?fill half of plate with vegetables ?limit fast food meals to no more than 1 per week ?read food labels for fat, fiber, carbohydrates and portion size ?check blood pressure weekly ?choose a place to take my blood pressure (home, clinic or office, retail store) ?write blood pressure results in a log or diary ?take blood pressure log to all doctor appointments ?take medications for blood pressure exactly as prescribed ?eat more whole grains, fruits and vegetables, lean meats and healthy fats ?Obtain blood pressure cuff and try to check blood pressure at least weekly and write down in a log ?Look ever education sent via My Chart- hypoglycemia and low sodium diet ? ?Our next appointment is by telephone on 08/04/21 at 1045 am ? ?Please call the care guide team at [phone] if you need to cancel or reschedule your appointment.  ? ?If you are experiencing a Mental Health or [place] or need someone to talk to, please call the Suicide and Crisis Lifeline: 988 ?call the [place] National Suicide Prevention Lifeline: (413)536-8356 or TTY: 203 432 8757 TTY 856-224-7538) to talk to a  trained counselor ?call 1-800-273-TALK (toll free, 24 hour hotline) ?go to [facility] [address], [place] [phone]) ?call 911  ? ?Following is a copy of your full care plan:  ?Care Plan : [place] of Care  ?Updates made by Kassie Mends, RN since 05/19/2021 12:00 AM  ?  ? ?Problem: No plan of care established for management of chronic disease state  (DM2, HTN)   ?Priority: High  ?  ? ?Long-Range Goal: Development of plan of care for chronic disease management  (DM2, HTN)   ?Start Date: 05/19/2021  ?Expected End Date: 11/15/2021  ?Priority: High  ?Note:   ?Current Barriers:  ?Knowledge Deficits related to plan of care for management of HTN and DMII  ?Patient reports she lives alone, is independent with ADL, IADL's, still drives, does not have blood pressure cuff but is able to obtain one, checks CBG once daily with ranges 112-140, uses stationary bicycle for exercise and plans to go back to [facility] to walk.  Patient has chronic back pain, sees specialist Dr. Hardin Negus, LCSW has closed case. ? ?RNCM Clinical Goal(s):  ?Patient will verbalize understanding of plan for management of HTN and DMII as evidenced by patient report, review of EHR and  through collaboration with RN Care manager, provider, and care team.  ? ?Interventions: ?1:1 collaboration with primary care provider regarding development and update of comprehensive plan of care as evidenced by provider attestation and co-signature ?Inter-disciplinary care team collaboration (see longitudinal plan of care) ?Evaluation of current treatment plan related to  self management and patient's adherence to plan as  established by provider ? ? ?Diabetes Interventions:  (Status:  New goal. and Goal on track:  Yes.) Long Term Goal ?Assessed patient's understanding of A1c goal: <7% ?Provided education to patient about basic DM disease process ?Reviewed medications with patient and discussed importance of medication  adherence ?Counseled on importance of regular laboratory monitoring as prescribed ?Discussed plans with patient for ongoing care management follow up and provided patient with direct contact information for care management team ?Provided patient with written educational materials related to hypo and hyperglycemia and importance of correct treatment ?Review of patient status, including review of consultants reports, relevant laboratory and other test results, and medications completed ?Screening for signs and symptoms of depression related to chronic disease state  ?Assessed social determinant of health barriers ?Reviewed carbohydrate modified diet ?Education via My Chart- hypoglycemia ?Lab Results  ?Component Value Date  ? HGBA1C 7.8 (H) 03/13/2021  ? ?Hypertension Interventions:  (Status:  New goal. and Goal on track:  Yes.) Long Term Goal ?Last practice recorded BP readings:  ?BP Readings from Last 3 Encounters:  ?05/13/21 108/68  ?04/29/21 114/74  ?04/21/21 116/74  ?Most recent eGFR/CrCl:  ?Lab Results  ?Component Value Date  ? EGFR 69 03/13/2021  ?  No components found for: CRCL ? ?Evaluation of current treatment plan related to hypertension self management and patient's adherence to plan as established by provider ?Reviewed medications with patient and discussed importance of compliance ?Counseled on the importance of exercise goals with target of 150 minutes per week ?Discussed plans with patient for ongoing care management follow up and provided patient with direct contact information for care management team ?Discussed complications of poorly controlled blood pressure such as heart disease, stroke, circulatory complications, vision complications, kidney impairment, sexual dysfunction ?Reviewed importance of checking blood pressure at least weekly and recording ?Education via My Chart- low sodium diet  ? ?Patient Goals/Self-Care Activities: ?Take medications as prescribed   ?Attend all scheduled provider  appointments ?Call pharmacy for medication refills 3-7 days in advance of running out of medications ?Perform all self care activities independently  ?Perform IADL's (shopping, preparing meals, housekeeping, managing finances) independently ?Call provider office for new concerns or questions  ?check blood sugar at prescribed times: once daily ?enter blood sugar readings and medication or insulin into daily log ?take the blood sugar log to all doctor visits ?take the blood sugar meter to all doctor visits ?trim toenails straight across ?fill half of plate with vegetables ?limit fast food meals to no more than 1 per week ?read food labels for fat, fiber, carbohydrates and portion size ?check blood pressure weekly ?choose a place to take my blood pressure (home, clinic or office, retail store) ?write blood pressure results in a log or diary ?take blood pressure log to all doctor appointments ?take medications for blood pressure exactly as prescribed ?eat more whole grains, fruits and vegetables, lean meats and healthy fats ?Obtain blood pressure cuff and try to check blood pressure at least weekly and write down in a log ?Look ever education sent via My Chart- hypoglycemia and low sodium diet ? ?  ?  ? ? ?Consent to CCM Services: ?Ms. Nading was given information about Chronic Care Management services including:  ?CCM service includes personalized support from designated clinical staff supervised by her physician, including individualized plan of care and coordination with other care providers ?24/7 contact phone numbers for assistance for urgent and routine care needs. ?Service will only be billed when office clinical staff spend 20 minutes or more  in a month to coordinate care. ?Only one practitioner may furnish and bill the service in a calendar month. ?The patient may stop CCM services at any time (effective at the end of the month) by phone call to the office staff. ?The patient will be responsible for cost  sharing (co-pay) of up to 20% of the service fee (after annual deductible is met). ? ?Patient agreed to services and verbal consent obtained.  ? ?Patient verbalizes understanding of instructions and care plan pro

## 2021-05-20 ENCOUNTER — Telehealth: Payer: Self-pay | Admitting: Physician Assistant

## 2021-05-20 NOTE — Telephone Encounter (Signed)
I have checked the schedule through the day for a cancellation without success. ?The patient says she is "only eating applesauce" because it does not nauseate her. Not taking the Zofran because she does not need it. She has a burning pain in her sides. Sometimes on the left. Sometimes on the right. She is drinking daily Miralax but does not feel like she has good bowel movements. Her bowel movements are soft.  ?Is there anything else she should do while waiting for her procedure day? ?

## 2021-05-20 NOTE — Telephone Encounter (Signed)
Inbound call from patient stating that she is still having a burning feeling in her side. Patient is seeking advice if she can come in sooner than 5/24 with Dr. Fuller Plan. Please advise ?

## 2021-05-21 ENCOUNTER — Other Ambulatory Visit: Payer: Self-pay

## 2021-05-21 MED ORDER — SUCRALFATE 1 G PO TABS: 1.0000 g | ORAL_TABLET | Freq: Three times a day (TID) | ORAL | 0 refills | Status: DC

## 2021-05-21 NOTE — Telephone Encounter (Signed)
Spoke with the patient.She agrees to this plan and is willing to have another doctor do her procedure if possible. ?Discussed Carafate, explained she should take other medications an hour before or at least 2 hours after it. ?

## 2021-05-21 NOTE — Telephone Encounter (Signed)
Patient contacted agrees to the 05/26/21 date for her EGD. Reviewed prep. Reminded to fast starting at 7:00 am. Reviewed medications. Reminded not to take glipizide or metformin the morning of the procedure. Take HTN medications before 7:00 am. Bring a care partner over 66 years of age that will stay and drive her home. ?

## 2021-05-21 NOTE — Telephone Encounter (Signed)
Yes, it is fine

## 2021-05-24 DIAGNOSIS — E1165 Type 2 diabetes mellitus with hyperglycemia: Secondary | ICD-10-CM

## 2021-05-24 DIAGNOSIS — I10 Essential (primary) hypertension: Secondary | ICD-10-CM

## 2021-05-26 ENCOUNTER — Encounter: Payer: Self-pay | Admitting: Gastroenterology

## 2021-05-26 ENCOUNTER — Ambulatory Visit (AMBULATORY_SURGERY_CENTER): Payer: Medicare Other | Admitting: Gastroenterology

## 2021-05-26 VITALS — BP 109/73 | HR 74 | Temp 97.5°F | Resp 12 | Ht 68.0 in | Wt 286.0 lb

## 2021-05-26 DIAGNOSIS — R1310 Dysphagia, unspecified: Secondary | ICD-10-CM | POA: Diagnosis not present

## 2021-05-26 DIAGNOSIS — K2900 Acute gastritis without bleeding: Secondary | ICD-10-CM

## 2021-05-26 DIAGNOSIS — K219 Gastro-esophageal reflux disease without esophagitis: Secondary | ICD-10-CM

## 2021-05-26 DIAGNOSIS — K2970 Gastritis, unspecified, without bleeding: Secondary | ICD-10-CM

## 2021-05-26 DIAGNOSIS — K449 Diaphragmatic hernia without obstruction or gangrene: Secondary | ICD-10-CM

## 2021-05-26 DIAGNOSIS — R110 Nausea: Secondary | ICD-10-CM | POA: Diagnosis not present

## 2021-05-26 MED ORDER — SODIUM CHLORIDE 0.9 % IV SOLN: 500.0000 mL | Freq: Once | INTRAVENOUS | Status: DC

## 2021-05-26 NOTE — Op Note (Signed)
[facility] ?Patient Name: [name] ?Procedure Date: 05/26/2021 10:02 AM ?MRN: [id number] ?Endoscopist: Mauri Pole , MD ?Age: 66 ?Referring MD:  ?Date of Birth: 06/05/1955 ?Gender: Female ?Account #: [id number] ?Procedure:                Upper GI endoscopy ?Indications:              Dysphagia, Dyspepsia, Anorexia, Nausea ?Medicines:                Monitored Anesthesia Care ?Procedure:                Pre-Anesthesia Assessment: ?                          - Prior to the procedure, a History and Physical  ?                          was performed, and patient medications and  ?                          allergies were reviewed. The patient's tolerance of  ?                          previous anesthesia was also reviewed. The risks  ?                          and benefits of the procedure and the sedation  ?                          options and risks were discussed with the patient.  ?                          All questions were answered, and informed consent  ?                          was obtained. Prior Anticoagulants: The patient has  ?                          taken no previous anticoagulant or antiplatelet  ?                          agents. ASA Grade Assessment: II - A patient with  ?                          mild systemic disease. After reviewing the risks  ?                          and benefits, the patient was deemed in  ?                          satisfactory condition to undergo the procedure. ?                          After obtaining informed consent, the endoscope was  ?  passed under direct vision. Throughout the  ?                          procedure, the patient's blood pressure, pulse, and  ?                          oxygen saturations were monitored continuously. The  ?                          GIF [id number] #[id number] was introduced through the  ?                          mouth, and advanced to the second part of duodenum.  ?                          The upper GI  endoscopy was accomplished without  ?                          difficulty. The patient tolerated the procedure  ?                          well. ?Scope In: ?Scope Out: ?Findings:                 The Z-line was regular and was found 36 cm from the  ?                          incisors. ?                          No endoscopic abnormality was evident in the  ?                          esophagus to explain the patient's complaint of  ?                          dysphagia. ?                          The gastroesophageal flap valve was visualized  ?                          endoscopically and classified as Hill Grade III  ?                          (minimal fold, loose to endoscope, hiatal hernia  ?                          likely). ?                          A 3 cm hiatal hernia was present. ?                          Patchy mild inflammation characterized by  ?  congestion (edema), erythema and friability was  ?                          found in the entire examined stomach. Biopsies were  ?                          taken with a cold forceps for histology. Biopsies  ?                          were taken with a cold forceps for Helicobacter  ?                          pylori testing. ?                          The cardia and gastric fundus were normal on  ?                          retroflexion. ?                          The examined duodenum was normal. ?Complications:            No immediate complications. ?Estimated Blood Loss:     Estimated blood loss was minimal. ?Impression:               - Z-line regular, 36 cm from the incisors. ?                          - No endoscopic esophageal abnormality to explain  ?                          patient's dysphagia. ?                          - Gastroesophageal flap valve classified as Hill  ?                          Grade III (minimal fold, loose to endoscope, hiatal  ?                          hernia likely). ?                          - 3 cm hiatal  hernia. ?                          - Gastritis. Biopsied. ?                          - Normal examined duodenum. ?Recommendation:           - Patient has a contact number available for  ?                          emergencies. The signs and symptoms of potential  ?  delayed complications were discussed with the  ?                          patient. Return to normal activities tomorrow.  ?                          Written discharge instructions were provided to the  ?                          patient. ?                          - Resume previous diet. ?                          - Continue present medications. ?                          - Await pathology results. ?                          - Return to GI office in 2 months, next available  ?                          appointment with Dr.Stark or APP ?                          - Follow an antireflux regimen. ?Mauri Pole, MD ?05/26/2021 10:20:24 AM ?This report has been signed electronically. ?

## 2021-05-26 NOTE — Progress Notes (Signed)
VS  DT ? ?Pt's states no medical or surgical changes since previsit or office visit. ? ?

## 2021-05-26 NOTE — Progress Notes (Signed)
Called to room to assist during endoscopic procedure.  Patient ID and intended procedure confirmed with present staff. Received instructions for my participation in the procedure from the performing physician.  

## 2021-05-26 NOTE — Patient Instructions (Signed)
YOU HAD AN ENDOSCOPIC PROCEDURE TODAY AT THE [facility]:   Refer to the procedure report that was given to you for any specific questions about what was found during the examination.  If the procedure report does not answer your questions, please call your gastroenterologist to clarify.  If you requested that your care partner not be given the details of your procedure findings, then the procedure report has been included in a sealed envelope for you to review at your convenience later.  YOU SHOULD EXPECT: Some feelings of bloating in the abdomen. Passage of more gas than usual.  Walking can help get rid of the air that was put into your GI tract during the procedure and reduce the bloating. If you had a lower endoscopy (such as a colonoscopy or flexible sigmoidoscopy) you may notice spotting of blood in your stool or on the toilet paper. If you underwent a bowel prep for your procedure, you may not have a normal bowel movement for a few days.  Please Note:  You might notice some irritation and congestion in your nose or some drainage.  This is from the oxygen used during your procedure.  There is no need for concern and it should clear up in a day or so.  SYMPTOMS TO REPORT IMMEDIATELY:    Following upper endoscopy (EGD)  Vomiting of blood or coffee ground material  New chest pain or pain under the shoulder blades  Painful or persistently difficult swallowing  New shortness of breath  Fever of 100F or higher  Black, tarry-looking stools  For urgent or emergent issues, a gastroenterologist can be reached at any hour by calling [phone]. Do not use MyChart messaging for urgent concerns.    DIET:  We do recommend a small meal at first, but then you may proceed to your regular diet.  Drink plenty of fluids but you should avoid alcoholic beverages for 24 hours.  ACTIVITY:  You should plan to take it easy for the rest of today and you should NOT DRIVE or use heavy machinery  until tomorrow (because of the sedation medicines used during the test).    FOLLOW UP: Our staff will call the number listed on your records 48-72 hours following your procedure to check on you and address any questions or concerns that you may have regarding the information given to you following your procedure. If we do not reach you, we will leave a message.  We will attempt to reach you two times.  During this call, we will ask if you have developed any symptoms of COVID 19. If you develop any symptoms (ie: fever, flu-like symptoms, shortness of breath, cough etc.) before then, please call [phone].  If you test positive for Covid 19 in the 2 weeks post procedure, please call and report this information to us.    If any biopsies were taken you will be contacted by phone or by letter within the next 1-3 weeks.  Please call us at [phone] if you have not heard about the biopsies in 3 weeks.    SIGNATURES/CONFIDENTIALITY: You and/or your care partner have signed paperwork which will be entered into your electronic medical record.  These signatures attest to the fact that that the information above on your After Visit Summary has been reviewed and is understood.  Full responsibility of the confidentiality of this discharge information lies with you and/or your care-partner. 

## 2021-05-26 NOTE — Progress Notes (Signed)
Report to pacu rn; vss. ?

## 2021-05-26 NOTE — Progress Notes (Signed)
[facility] History and Physical ? ? ?Primary Care Physician:  Fayrene Helper, MD ? ? ?Reason for Procedure:  Nausea, dysphagia ? ?Plan:    EGD  with possible interventions as needed ? ? ? ? ?HPI: [name] is a very pleasant 66 y.o. female here for EGD for further evaluation of nausea and dysphagia. ? ?Please refer to office visit note 04/29/21 by Nicoletta Ba for further details ? ?The risks and benefits as well as alternatives of endoscopic procedure(s) have been discussed and reviewed. All questions answered. The patient agrees to proceed. ? ? ? ?Past Medical History:  ?Diagnosis Date  ? Allergy   ? Anemia   ? Arthritis   ? Cataract   ? forming  ? COVID-19   ? Diabetes mellitus without complication ([place]) 3557  ? GERD (gastroesophageal reflux disease)   ? History of colon polyps   ? Hyperlipidemia   ? Hypertension 2011  ? Morbid obesity ([place])   ? Neuromuscular disorder ([place])   ? issues with nerves in back   ? Osteopenia   ? Thyroid disease   ? ? ?Past Surgical History:  ?Procedure Laterality Date  ? ABDOMINAL HYSTERECTOMY    ? APPENDECTOMY    ? CHOLECYSTECTOMY    ? COLONOSCOPY  2009  ? [facility] - normal per pt   ? TONSILLECTOMY    ? ? ?Prior to Admission medications   ?Medication Sig Start Date End Date Taking? Authorizing Provider  ?aspirin 81 MG tablet Take 81 mg by mouth daily.    [provider]  ?benazepril (LOTENSIN) 5 MG tablet Take 1 tablet (5 mg total) by mouth daily. 05/13/21   Fayrene Helper, MD  ?blood glucose meter kit and supplies Dispense based on patient and insurance preference. Once daily testing dx e11.9 08/14/19   Fayrene Helper, MD  ?calcium-vitamin D (OSCAL WITH D) 500-200 MG-UNIT per tablet Take 1 tablet by mouth daily.    [provider]  ?cyclobenzaprine (FLEXERIL) 5 MG tablet TAKE ONE TABLET BY MOUTH AT BEDTIME AS NEEDED. SHOULD LAST SIX MONTHS. ?Patient taking differently: Take 5 mg by mouth daily as needed for muscle spasms. 02/28/15    Fayrene Helper, MD  ?Dexlansoprazole 30 MG capsule DR Take 1 capsule (30 mg total) by mouth 2 (two) times daily before a meal. 04/29/21   Esterwood, Amy S, PA-C  ?fluticasone (FLONASE) 50 MCG/ACT nasal spray USE 2 SPRAYS IN EACH NOSTRIL ONCE DAILY.  SHAKE GENTLY BEFORE USING. ?Patient taking differently: Place 2 sprays into both nostrils daily. 04/02/21   Fayrene Helper, MD  ?gabapentin (NEURONTIN) 300 MG capsule TAKE ONE CAPSULE BY MOUTH TWICE DAILY 10/06/20   Fayrene Helper, MD  ?glipiZIDE (GLIPIZIDE XL) 10 MG 24 hr tablet Take 1 tablet (10 mg total) by mouth daily with breakfast. 03/19/21   Fayrene Helper, MD  ?glucose blood (ONETOUCH VERIO) test strip USE TO TEST BLOOD SUGAR EVERY DAY 01/02/21   Renee Rival, FNP  ?Lancets (ONETOUCH DELICA PLUS [id number]) MISC USE TO TEST BLOOD SUGAR ONCE DAILY 04/20/21   Fayrene Helper, MD  ?levothyroxine (SYNTHROID) 150 MCG tablet TAKE ONE TABLET BY MOUTH DAILY. 04/02/21   Fayrene Helper, MD  ?metFORMIN (GLUCOPHAGE) 1000 MG tablet TAKE ONE TABLET BY MOUTH TWICE DAILY WITH A MEAL 04/02/21   Fayrene Helper, MD  ?montelukast (SINGULAIR) 10 MG tablet TAKE ONE TABLET BY MOUTH AT BEDTIME 04/02/21   Fayrene Helper, MD  ?ondansetron [facility])  4 MG tablet Take 1 tablet (4 mg total) by mouth every 6 (six) hours as needed for nausea or vomiting. 04/29/21   Esterwood, Amy S, PA-C  ?oxyCODONE-acetaminophen (PERCOCET) 7.5-325 MG tablet Take one tablet once daily , as needed , for uncontrolled back pain 03/17/18   Fayrene Helper, MD  ?rosuvastatin (CRESTOR) 40 MG tablet Take 1 tablet (40 mg total) by mouth daily. 01/02/21   Renee Rival, FNP  ?sucralfate (CARAFATE) 1 g tablet Take 1 tablet (1 g total) by mouth 3 (three) times daily. 05/21/21   Esterwood, Amy S, PA-C  ?traZODone (DESYREL) 100 MG tablet TAKE ONE TABLET BY MOUTH AT BEDTIME 04/02/21   Fayrene Helper, MD  ?Turmeric 500 MG CAPS Take 1 capsule by mouth daily.    [provider]   ?venlafaxine XR (EFFEXOR XR) 37.5 MG 24 hr capsule Take 1 capsule (37.5 mg total) by mouth daily with breakfast. 03/19/21   Fayrene Helper, MD  ? ? ?Current Outpatient Medications  ?Medication Sig Dispense Refill  ? aspirin 81 MG tablet Take 81 mg by mouth daily.    ? benazepril (LOTENSIN) 5 MG tablet Take 1 tablet (5 mg total) by mouth daily. 30 tablet 3  ? blood glucose meter kit and supplies Dispense based on patient and insurance preference. Once daily testing dx e11.9 1 each 0  ? calcium-vitamin D (OSCAL WITH D) 500-200 MG-UNIT per tablet Take 1 tablet by mouth daily.    ? cyclobenzaprine (FLEXERIL) 5 MG tablet TAKE ONE TABLET BY MOUTH AT BEDTIME AS NEEDED. SHOULD LAST SIX MONTHS. (Patient taking differently: Take 5 mg by mouth daily as needed for muscle spasms.) 30 tablet 0  ? Dexlansoprazole 30 MG capsule DR Take 1 capsule (30 mg total) by mouth 2 (two) times daily before a meal. 60 capsule 0  ? fluticasone (FLONASE) 50 MCG/ACT nasal spray USE 2 SPRAYS IN EACH NOSTRIL ONCE DAILY.  SHAKE GENTLY BEFORE USING. (Patient taking differently: Place 2 sprays into both nostrils daily.) 16 g 3  ? gabapentin (NEURONTIN) 300 MG capsule TAKE ONE CAPSULE BY MOUTH TWICE DAILY 180 capsule 3  ? glipiZIDE (GLIPIZIDE XL) 10 MG 24 hr tablet Take 1 tablet (10 mg total) by mouth daily with breakfast. 90 tablet 1  ? glucose blood (ONETOUCH VERIO) test strip USE TO TEST BLOOD SUGAR EVERY DAY 100 strip 5  ? Lancets (ONETOUCH DELICA PLUS [id number]) MISC USE TO TEST BLOOD SUGAR ONCE DAILY 100 each 5  ? levothyroxine (SYNTHROID) 150 MCG tablet TAKE ONE TABLET BY MOUTH DAILY. 90 tablet 1  ? metFORMIN (GLUCOPHAGE) 1000 MG tablet TAKE ONE TABLET BY MOUTH TWICE DAILY WITH A MEAL 180 tablet 1  ? montelukast (SINGULAIR) 10 MG tablet TAKE ONE TABLET BY MOUTH AT BEDTIME 90 tablet 1  ? ondansetron (ZOFRAN) 4 MG tablet Take 1 tablet (4 mg total) by mouth every 6 (six) hours as needed for nausea or vomiting. 40 tablet 2  ?  oxyCODONE-acetaminophen (PERCOCET) 7.5-325 MG tablet Take one tablet once daily , as needed , for uncontrolled back pain 10 tablet 0  ? rosuvastatin (CRESTOR) 40 MG tablet Take 1 tablet (40 mg total) by mouth daily. 90 tablet 3  ? sucralfate (CARAFATE) 1 g tablet Take 1 tablet (1 g total) by mouth 3 (three) times daily. 90 tablet 0  ? traZODone (DESYREL) 100 MG tablet TAKE ONE TABLET BY MOUTH AT BEDTIME 90 tablet 1  ? Turmeric 500 MG CAPS Take 1 capsule by mouth  daily.    ? venlafaxine XR (EFFEXOR XR) 37.5 MG 24 hr capsule Take 1 capsule (37.5 mg total) by mouth daily with breakfast. 30 capsule 3  ? ?Current Facility-Administered Medications  ?Medication Dose Route Frequency Provider Last Rate Last Admin  ? Influenza (>/= 3 years) inactive virus vaccine (FLVIRIN/FLUZONE) injection SUSP 0.5 mL  0.5 mL Intramuscular Once Fayrene Helper, MD      ? methylPREDNISolone acetate (DEPO-MEDROL) injection 120 mg  120 mg Intramuscular Once Fayrene Helper, MD      ? ? ?Allergies as of 05/26/2021 - Review Complete 05/26/2021  ?Allergen Reaction Noted  ? Ozempic (0.25 or 0.5 mg-dose) [semaglutide(0.25 or 0.25m-dos)] Nausea Only 04/18/2021  ? Naproxen sodium Hives and Swelling 12/05/2007  ? Tsh [thyrotropin]  12/08/2017  ? Statins Other (See Comments) 07/24/2017  ? ? ?Family History  ?Problem Relation Age of Onset  ? Asthma Mother   ? Hypertension Mother   ? Heart disease Mother 372 ?     cabg  ? Pancreatic cancer Mother   ? Diabetes Father   ? Hypertension Father   ? Heart disease Father 472 ?     cabg  ? Cancer Sister   ?     breast, uterine  ? Cancer Sister   ? Cancer Maternal Grandmother   ? Prostate cancer Paternal Grandmother   ? Cancer Paternal Grandfather   ? Colon polyps Neg Hx   ? Colon cancer Neg Hx   ? Esophageal cancer Neg Hx   ? Rectal cancer Neg Hx   ? Stomach cancer Neg Hx   ? ? ?Social History  ? ?Socioeconomic History  ? Marital status: Married  ?  Spouse name: RShawnika Pepin ? Number of children: 2  ?  Years of education: 177 ? Highest education level: 12th grade  ?Occupational History  ? Occupation: disabled   ?Tobacco Use  ? Smoking status: Never  ?  Passive exposure: Never  ? Smokeless tobacco: Never  ?Vaping Use  ? V

## 2021-05-28 ENCOUNTER — Telehealth: Payer: Self-pay

## 2021-05-28 NOTE — Telephone Encounter (Signed)
?  Follow up Call- ? ? ?  05/26/2021  ?  9:52 AM  ?Call back number  ?Post procedure Call Back phone  # [phone]  ?Permission to leave phone message Yes  ?  ? ?Patient questions: ? ?Do you have a fever, pain , or abdominal swelling? No. ?Pain Score  0 * ? ?Have you tolerated food without any problems? Yes.   ? ?Have you been able to return to your normal activities? Yes.   ? ?Do you have any questions about your discharge instructions: ?Diet   No. ?Medications  No. ?Follow up visit  No. ? ?Do you have questions or concerns about your Care? Yes.   ? ?Patient c/o continued symptoms as she had prior to procedure, no worse. Advised patient that Dr. Silverio Decamp will not have additional recommendations/information until the pathology report come back. Patient verbalizes understanding. Upon discussion, patient states she did not receive any handouts on antireflux precautions. I went over education/explanation on hiatal hernia and antireflux precautions with patient and told her I will obtain appropriate handouts and leave them at the front desk for her to pick up later today (she prefers this over mailing the handouts). ? ?Actions: ?* If pain score is 4 or above: ?No action needed, pain <4. ? ? ?

## 2021-05-29 NOTE — Telephone Encounter (Signed)
Ok, will contact patient with results. Thanks ?

## 2021-06-05 ENCOUNTER — Telehealth: Payer: Self-pay | Admitting: Gastroenterology

## 2021-06-05 ENCOUNTER — Encounter: Payer: Self-pay | Admitting: Gastroenterology

## 2021-06-05 NOTE — Telephone Encounter (Signed)
Called and spoke with patient regarding her results. Pt has been advised to keep her follow up appt as scheduled with Dr. Fuller Plan. Pt verbalized understanding and had no concerns at the end of the call. ?

## 2021-06-05 NOTE — Telephone Encounter (Signed)
Patient calling for biopsy results from recent procedure. ?

## 2021-06-05 NOTE — Telephone Encounter (Signed)
Biopsies did not show any abnormality.  Please schedule office follow-up visit with Amy Esterwood at next available appointment.  Thanks ?

## 2021-06-17 ENCOUNTER — Encounter: Payer: Medicare Other | Admitting: Gastroenterology

## 2021-06-18 DIAGNOSIS — E559 Vitamin D deficiency, unspecified: Secondary | ICD-10-CM | POA: Diagnosis not present

## 2021-06-18 DIAGNOSIS — E1165 Type 2 diabetes mellitus with hyperglycemia: Secondary | ICD-10-CM | POA: Diagnosis not present

## 2021-06-18 DIAGNOSIS — E039 Hypothyroidism, unspecified: Secondary | ICD-10-CM | POA: Diagnosis not present

## 2021-06-18 DIAGNOSIS — E785 Hyperlipidemia, unspecified: Secondary | ICD-10-CM | POA: Diagnosis not present

## 2021-06-18 DIAGNOSIS — I10 Essential (primary) hypertension: Secondary | ICD-10-CM | POA: Diagnosis not present

## 2021-06-19 LAB — CMP14+EGFR
ALT: 17 IU/L (ref 0–32)
AST: 22 IU/L (ref 0–40)
Albumin/Globulin Ratio: 1.7 (ref 1.2–2.2)
Albumin: 4 g/dL (ref 3.8–4.8)
Alkaline Phosphatase: 67 IU/L (ref 44–121)
BUN/Creatinine Ratio: 12 (ref 12–28)
BUN: 11 mg/dL (ref 8–27)
Bilirubin Total: 0.4 mg/dL (ref 0.0–1.2)
CO2: 24 mmol/L (ref 20–29)
Calcium: 9.5 mg/dL (ref 8.7–10.3)
Chloride: 104 mmol/L (ref 96–106)
Creatinine, Ser: 0.9 mg/dL (ref 0.57–1.00)
Globulin, Total: 2.4 g/dL (ref 1.5–4.5)
Glucose: 119 mg/dL — ABNORMAL HIGH (ref 70–99)
Potassium: 4.8 mmol/L (ref 3.5–5.2)
Sodium: 141 mmol/L (ref 134–144)
Total Protein: 6.4 g/dL (ref 6.0–8.5)
eGFR: 71 mL/min/{1.73_m2} (ref >59)

## 2021-06-19 LAB — LIPID PANEL
Chol/HDL Ratio: 2.6 ratio (ref 0.0–4.4)
Cholesterol, Total: 118 mg/dL (ref 100–199)
HDL: 45 mg/dL (ref >39)
LDL Chol Calc (NIH): 52 mg/dL (ref 0–99)
Triglycerides: 118 mg/dL (ref 0–149)
VLDL Cholesterol Cal: 21 mg/dL (ref 5–40)

## 2021-06-19 LAB — HEMOGLOBIN A1C
Est. average glucose Bld gHb Est-mCnc: 148 mg/dL
Hgb A1c MFr Bld: 6.8 % — ABNORMAL HIGH (ref 4.8–5.6)

## 2021-06-23 ENCOUNTER — Ambulatory Visit: Payer: Medicare Other | Admitting: Family Medicine

## 2021-06-23 ENCOUNTER — Ambulatory Visit (INDEPENDENT_AMBULATORY_CARE_PROVIDER_SITE_OTHER): Payer: Medicare Other | Admitting: Family Medicine

## 2021-06-23 ENCOUNTER — Encounter: Payer: Self-pay | Admitting: Family Medicine

## 2021-06-23 VITALS — BP 118/78 | HR 67 | Resp 16 | Ht 68.0 in | Wt 282.8 lb

## 2021-06-23 DIAGNOSIS — F418 Other specified anxiety disorders: Secondary | ICD-10-CM | POA: Diagnosis not present

## 2021-06-23 DIAGNOSIS — E039 Hypothyroidism, unspecified: Secondary | ICD-10-CM | POA: Diagnosis not present

## 2021-06-23 DIAGNOSIS — E1165 Type 2 diabetes mellitus with hyperglycemia: Secondary | ICD-10-CM | POA: Diagnosis not present

## 2021-06-23 DIAGNOSIS — I10 Essential (primary) hypertension: Secondary | ICD-10-CM

## 2021-06-23 DIAGNOSIS — G894 Chronic pain syndrome: Secondary | ICD-10-CM

## 2021-06-23 DIAGNOSIS — E559 Vitamin D deficiency, unspecified: Secondary | ICD-10-CM

## 2021-06-23 NOTE — Patient Instructions (Addendum)
F/U in mid October, for flu vaccine, call if you need me sooner  HBA1C, chem 7 and EGFr, and microalb 5 days before October visit  Please add TSH and vit d to recent labs  CONGRATS , [name] t blood sugar and cholesterol, normal liver and kidney function  Thanks for choosing [facility], we consider it a privelige to serve you.

## 2021-06-24 ENCOUNTER — Encounter: Payer: Self-pay | Admitting: Family Medicine

## 2021-06-24 NOTE — Assessment & Plan Note (Signed)
Improving  Patient re-educated about  the importance of commitment to a  minimum of 150 minutes of exercise per week as able.  The importance of healthy food choices with portion control discussed, as well as eating regularly and within a 12 hour window most days. The need to choose "clean , green" food 50 to 75% of the time is discussed, as well as to make water the primary drink and set a goal of 64 ounces water daily.       06/23/2021    1:23 PM 05/26/2021    9:47 AM 05/13/2021    8:07 AM  Weight /BMI  Weight 282 lb 12.8 oz 286 lb 284 lb 0.6 oz  Height '5\' 8"'$  (1.727 m) '5\' 8"'$  (1.727 m) '5\' 8"'$  (1.727 m)  BMI 43 kg/m2 43.49 kg/m2 43.19 kg/m2

## 2021-06-24 NOTE — Assessment & Plan Note (Signed)
Controlled, no change in medication DASH diet and commitment to daily physical activity for a minimum of 30 minutes discussed and encouraged, as a part of hypertension management. The importance of attaining a healthy weight is also discussed.     06/23/2021    1:23 PM 05/26/2021   10:36 AM 05/26/2021   10:26 AM 05/26/2021   10:16 AM 05/26/2021   10:10 AM 05/26/2021   10:05 AM 05/26/2021    9:47 AM  BP/Weight  Systolic BP 583 094 076 91 808 811 93  Diastolic BP 78 73 64 58 83 74 45  Wt. (Lbs) 282.8      286  BMI 43 kg/m2      43.49 kg/m2

## 2021-06-24 NOTE — Assessment & Plan Note (Signed)
Updated lab needed at/ before next visit.   

## 2021-06-24 NOTE — Assessment & Plan Note (Signed)
Controlled and treated by pain clinic

## 2021-06-24 NOTE — Progress Notes (Signed)
[name]     MRN: [id number]      DOB: 1955/03/10   HPI [name] is here for follow up and re-evaluation of chronic medical conditions, medication management and review of any available recent lab and radiology data.  Preventive health is updated, specifically  Cancer screening and Immunization.   Questions or concerns regarding consultations or procedures which the PT has had in the interim are  addressed. The PT denies any adverse reactions to current medications since the last visit.  Denies polyuria, polydipsia, blurred vision , or hypoglycemic episodes. Reports reduced appetite, and has chronic abdominal pain being managed by GI There are no new concerns.  ROS Denies recent fever or chills. Denies sinus pressure, nasal congestion, ear pain or sore throat. Denies chest congestion, productive cough or wheezing. Denies chest pains, palpitations and leg swelling Denies abdominal pain, nausea, vomiting,diarrhea or constipation.   Denies dysuria, frequency, hesitancy or incontinence. Denies uncontrolled joint pain, swelling and limitation in mobility. Denies headaches, seizures, numbness, or tingling. Denies uncontrolled depression, anxiety or insomnia.Ongoing stress due to health issues in son and other family members Denies skin break down or rash.   PE  BP 118/78   Pulse 67   Resp 16   Ht '5\' 8"'$  (1.727 m)   Wt 282 lb 12.8 oz (128.3 kg)   SpO2 95%   BMI 43.00 kg/m   Patient alert and oriented and in no cardiopulmonary distress.  HEENT: No facial asymmetry, EOMI,     Neck supple .  Chest: Clear to auscultation bilaterally.  CVS: S1, S2 no murmurs, no S3.Regular rate.  ABD: Soft non tender.   Ext: No edema  MS: decreased ROM spine, shoulders, hips and knees.  Skin: Intact, no ulcerations or rash noted.  Psych: Good eye contact, normal affect. Memory intact not anxious or depressed appearing.  CNS: CN 2-12 intact, power,  normal throughout.no focal deficits  noted.   Assessment & Plan  Hypertension goal BP (blood pressure) < 130/80 Controlled, no change in medication DASH diet and commitment to daily physical activity for a minimum of 30 minutes discussed and encouraged, as a part of hypertension management. The importance of attaining a healthy weight is also discussed.     06/23/2021    1:23 PM 05/26/2021   10:36 AM 05/26/2021   10:26 AM 05/26/2021   10:16 AM 05/26/2021   10:10 AM 05/26/2021   10:05 AM 05/26/2021    9:47 AM  BP/Weight  Systolic BP 948 546 270 91 350 093 93  Diastolic BP 78 73 64 58 83 74 45  Wt. (Lbs) 282.8      286  BMI 43 kg/m2      43.49 kg/m2       Hypothyroidism Updated lab needed at/ before next visit.   Morbid obesity Improving  Patient re-educated about  the importance of commitment to a  minimum of 150 minutes of exercise per week as able.  The importance of healthy food choices with portion control discussed, as well as eating regularly and within a 12 hour window most days. The need to choose "clean , green" food 50 to 75% of the time is discussed, as well as to make water the primary drink and set a goal of 64 ounces water daily.       06/23/2021    1:23 PM 05/26/2021    9:47 AM 05/13/2021    8:07 AM  Weight /BMI  Weight 282 lb 12.8 oz 286  lb 284 lb 0.6 oz  Height '5\' 8"'$  (1.727 m) '5\' 8"'$  (1.727 m) '5\' 8"'$  (1.727 m)  BMI 43 kg/m2 43.49 kg/m2 43.19 kg/m2      Type 2 diabetes mellitus with hyperglycemia (HCC) Controlled, no change in medication [name] is reminded of the importance of commitment to daily physical activity for 30 minutes or more, as able and the need to limit carbohydrate intake to 30 to 60 grams per meal to help with blood sugar control.   The need to take medication as prescribed, test blood sugar as directed, and to call between visits if there is a concern that blood sugar is uncontrolled is also discussed.   [name] is reminded of the importance of daily foot exam, annual eye  examination, and good blood sugar, blood pressure and cholesterol control.     Latest Ref Rng & Units 06/18/2021    8:20 AM 04/18/2021    5:07 PM 03/13/2021    8:03 AM 11/11/2020    9:22 AM 08/07/2020    9:57 AM  Diabetic Labs  HbA1c 4.8 - 5.6 % 6.8    7.8   7.7     Micro/Creat Ratio 0 - 29 mg/g creat     <6    Chol 100 - 199 mg/dL 118     128     HDL >39 mg/dL 45     49     Calc LDL 0 - 99 mg/dL 52     54     Triglycerides 0 - 149 mg/dL 118     145     Creatinine 0.57 - 1.00 mg/dL 0.90   0.95   0.92   0.90         06/23/2021    1:23 PM 05/26/2021   10:36 AM 05/26/2021   10:26 AM 05/26/2021   10:16 AM 05/26/2021   10:10 AM 05/26/2021   10:05 AM 05/26/2021    9:47 AM  BP/Weight  Systolic BP 751 025 852 91 778 242 93  Diastolic BP 78 73 64 58 83 74 45  Wt. (Lbs) 282.8      286  BMI 43 kg/m2      43.49 kg/m2      Latest Ref Rng & Units 01/09/2021   10:00 AM 11/06/2020   12:00 AM  Foot/eye exam completion dates  Eye Exam No Retinopathy  No Retinopathy       Foot Form Completion  Done      This result is from an external source.        Depression with anxiety Improved, continue current medication  Chronic pain Controlled and treated by pain clinic

## 2021-06-24 NOTE — Assessment & Plan Note (Signed)
Improved, continue current medication

## 2021-06-24 NOTE — Assessment & Plan Note (Signed)
Controlled, no change in medication Ms. Ruppert is reminded of the importance of commitment to daily physical activity for 30 minutes or more, as able and the need to limit carbohydrate intake to 30 to 60 grams per meal to help with blood sugar control.   The need to take medication as prescribed, test blood sugar as directed, and to call between visits if there is a concern that blood sugar is uncontrolled is also discussed.   Ms. Gains is reminded of the importance of daily foot exam, annual eye examination, and good blood sugar, blood pressure and cholesterol control.     Latest Ref Rng & Units 06/18/2021    8:20 AM 04/18/2021    5:07 PM 03/13/2021    8:03 AM 11/11/2020    9:22 AM 08/07/2020    9:57 AM  Diabetic Labs  HbA1c 4.8 - 5.6 % 6.8    7.8   7.7     Micro/Creat Ratio 0 - 29 mg/g creat     <6    Chol 100 - 199 mg/dL 118     128     HDL >39 mg/dL 45     49     Calc LDL 0 - 99 mg/dL 52     54     Triglycerides 0 - 149 mg/dL 118     145     Creatinine 0.57 - 1.00 mg/dL 0.90   0.95   0.92   0.90         06/23/2021    1:23 PM 05/26/2021   10:36 AM 05/26/2021   10:26 AM 05/26/2021   10:16 AM 05/26/2021   10:10 AM 05/26/2021   10:05 AM 05/26/2021    9:47 AM  BP/Weight  Systolic BP 315 400 867 91 619 509 93  Diastolic BP 78 73 64 58 83 74 45  Wt. (Lbs) 282.8      286  BMI 43 kg/m2      43.49 kg/m2      Latest Ref Rng & Units 01/09/2021   10:00 AM 11/06/2020   12:00 AM  Foot/eye exam completion dates  Eye Exam No Retinopathy  No Retinopathy       Foot Form Completion  Done      This result is from an external source.

## 2021-06-25 LAB — SPECIMEN STATUS REPORT

## 2021-06-25 LAB — VITAMIN D 25 HYDROXY (VIT D DEFICIENCY, FRACTURES): Vit D, 25-Hydroxy: 42.8 ng/mL (ref 30.0–100.0)

## 2021-06-25 LAB — TSH: TSH: 0.494 u[IU]/mL (ref 0.450–4.500)

## 2021-07-06 ENCOUNTER — Other Ambulatory Visit: Payer: Self-pay | Admitting: Physician Assistant

## 2021-07-17 ENCOUNTER — Ambulatory Visit
Admission: RE | Admit: 2021-07-17 | Discharge: 2021-07-17 | Disposition: A | Discharge status: Home / Self Care | Payer: Medicare Other | Source: Ambulatory Visit | Attending: Family Medicine | Admitting: Family Medicine

## 2021-07-17 DIAGNOSIS — Z1231 Encounter for screening mammogram for malignant neoplasm of breast: Secondary | ICD-10-CM | POA: Diagnosis not present

## 2021-07-23 ENCOUNTER — Other Ambulatory Visit: Payer: Self-pay

## 2021-07-23 DIAGNOSIS — I10 Essential (primary) hypertension: Secondary | ICD-10-CM

## 2021-07-23 DIAGNOSIS — E1165 Type 2 diabetes mellitus with hyperglycemia: Secondary | ICD-10-CM

## 2021-08-04 ENCOUNTER — Telehealth: Payer: Self-pay | Admitting: *Deleted

## 2021-08-04 ENCOUNTER — Telehealth: Payer: Medicare Other

## 2021-08-04 ENCOUNTER — Other Ambulatory Visit: Payer: Self-pay | Admitting: Family Medicine

## 2021-08-04 NOTE — Telephone Encounter (Signed)
  Care Management   Follow Up Note   08/04/2021 Name: ALIX LAHMANN MRN: [id number] DOB: 01/22/1956   Referred by: Fayrene Helper, MD Reason for referral : Chronic Care Management (DM2, HTN)   An unsuccessful telephone outreach was attempted today. The patient was referred to the case management team for assistance with care management and care coordination.   Follow Up Plan: Telephone follow up appointment with care management team member scheduled for: upon care guide rescheduling.  Jacqlyn Larsen [facility], BSN RN Case Manager [facility] [phone]

## 2021-08-11 ENCOUNTER — Encounter: Payer: Self-pay | Admitting: Gastroenterology

## 2021-08-11 ENCOUNTER — Ambulatory Visit: Payer: Medicare Other | Admitting: Gastroenterology

## 2021-08-11 VITALS — BP 116/72 | HR 76 | Ht 68.0 in | Wt 281.4 lb

## 2021-08-11 DIAGNOSIS — K219 Gastro-esophageal reflux disease without esophagitis: Secondary | ICD-10-CM | POA: Diagnosis not present

## 2021-08-11 DIAGNOSIS — R1310 Dysphagia, unspecified: Secondary | ICD-10-CM

## 2021-08-11 DIAGNOSIS — K760 Fatty (change of) liver, not elsewhere classified: Secondary | ICD-10-CM

## 2021-08-11 DIAGNOSIS — K5900 Constipation, unspecified: Secondary | ICD-10-CM | POA: Diagnosis not present

## 2021-08-11 NOTE — Progress Notes (Signed)
Assessment     Dysphagia, bread and large pills GERD Constipation Right sided abdominal pain / right flank pain - suspected musculoskeletal pain Hepatic steatosis   Recommendations    Schedule barium esophagram Continue Dexilant 30 mg p.o. twice daily and follow antireflux measures Begin MiraLAX daily, titrate dose for complete bowel movement daily Tylenol or Advil for right abdominal, right flank pain Long-term carb modified, fat modified weight loss diet supervised by her PCP   HPI    This is a 66 year old female with GERD, dysphagia, right-sided abdominal pain, right flank pain. Ozempic was stopped in March due to side effects.  She relates intermittent problems with right flank right / right abdominal pain that she describes as a "knot".  Symptoms do not relate to bowel movements.  Pain is bothersome some days and other days she is pain-free.  She has bowel movements about every 2 to 3 days.  She notes difficulties swallowing large pills and breads although her dysphagia symptoms have overall improved.  EGD May 2023 - Z-line regular, 36 cm from the incisors. - No endoscopic esophageal abnormality to explain patient's dysphagia. - Gastroesophageal flap valve classified as Hill Grade III (minimal fold, loose to endoscope, hiatal hernia likely). - 3 cm hiatal hernia. - Gastritis. Biopsied. (unremarkable) - Normal examined duodenum.  CT AP April 18, 2021 1. Small hiatal hernia. 2. Hepatic steatosis. 3. Mild, nonspecific mesenteric inflammatory fat stranding within the mid to lower left abdomen which may be chronic in nature. Sequelae associated with mild mesenteric panniculitis cannot be excluded. 4. Aortic atherosclerosis.   Labs / Imaging       Latest Ref Rng & Units 06/18/2021    8:20 AM 04/18/2021    5:07 PM 11/11/2020    9:22 AM  Hepatic Function  Total Protein 6.0 - 8.5 g/dL 6.4  7.6  6.6   Albumin 3.8 - 4.8 g/dL 4.0  4.0  4.1   AST 0 - 40 IU/L 22  37  24    ALT 0 - 32 IU/L '17  28  17   ' Alk Phosphatase 44 - 121 IU/L 67  65  82   Total Bilirubin 0.0 - 1.2 mg/dL 0.4  0.5  0.5        Latest Ref Rng & Units 04/18/2021    5:07 PM 11/11/2020    9:22 AM 12/07/2018    9:05 AM  CBC  WBC 4.0 - 10.5 K/uL 10.8  9.0  9.5   Hemoglobin 12.0 - 15.0 g/dL 14.5  13.5  13.7   Hematocrit 36.0 - 46.0 % 44.8  40.4  41.3   Platelets 150 - 400 K/uL 393  336  403     Current Medications, Allergies, Past Medical History, Past Surgical History, Family History and Social History were reviewed in [employer] record.   Physical Exam: General: Well developed, well nourished, no acute distress Head: Normocephalic and atraumatic Eyes: Sclerae anicteric, EOMI Ears: Normal auditory acuity Mouth: Not examined Lungs: Clear throughout to auscultation Heart: Regular rate and rhythm; no murmurs, rubs or bruits Abdomen: Soft, right abdominal / right flank tenderness to light touch and non distended. No masses, hepatosplenomegaly or hernias noted. Normal Bowel sounds Rectal: Not done Musculoskeletal: Symmetrical with no gross deformities  Pulses:  Normal pulses noted Extremities: No clubbing, cyanosis, edema or deformities noted Neurological: Alert oriented x 4, grossly nonfocal Psychological:  Alert and cooperative. Normal mood and affect   Mavis Fichera T. Fuller Plan, MD 08/11/2021,  8:29 AM

## 2021-08-11 NOTE — Patient Instructions (Signed)
You can take over the counter Miralax daily for constipation.  You can take Tylenol or Advil for your right sided pain.  You have been scheduled for a Barium Esophogram at [facility] (1st floor of the hospital) on 08/20/21 at 11:00am. Please arrive 15 minutes prior to your appointment for registration. Make certain not to have anything to eat or drink 3 hours prior to your test. If you need to reschedule for any reason, please contact radiology at [phone] to do so. ___________________________________________________________ A barium swallow is an examination that concentrates on views of the esophagus. This tends to be a double contrast exam (barium and two liquids which, when combined, create a gas to distend the wall of the oesophagus) or single contrast (non-ionic iodine based). The study is usually tailored to your symptoms so a good history is essential. Attention is paid during the study to the form, structure and configuration of the esophagus, looking for functional disorders (such as aspiration, dysphagia, achalasia, motility and reflux) EXAMINATION You may be asked to change into a gown, depending on the type of swallow being performed. A radiologist and radiographer will perform the procedure. The radiologist will advise you of the type of contrast selected for your procedure and direct you during the exam. You will be asked to stand, sit or lie in several different positions and to hold a small amount of fluid in your mouth before being asked to swallow while the imaging is performed .In some instances you may be asked to swallow barium coated marshmallows to assess the motility of a solid food bolus. The exam can be recorded as a digital or video fluoroscopy procedure. POST PROCEDURE It will take 1-2 days for the barium to pass through your system. To facilitate this, it is important, unless otherwise directed, to increase your fluids for the next 24-48hrs and to resume your  normal diet.  This test typically takes about 30 minutes to perform. ___________________________________________________________  The [facility] providers would like to encourage you to use [facility] to communicate with providers for non-urgent requests or questions.  Due to long hold times on the telephone, sending your provider a message by [facility] may be a faster and more efficient way to get a response.  Please allow 48 business hours for a response.  Please remember that this is for non-urgent requests.   Due to recent changes in healthcare laws, you may see the results of your imaging and laboratory studies on MyChart before your provider has had a chance to review them.  We understand that in some cases there may be results that are confusing or concerning to you. Not all laboratory results come back in the same time frame and the provider may be waiting for multiple results in order to interpret others.  Please give [place] 48 hours in order for your provider to thoroughly review all the results before contacting the office for clarification of your results.   Thank you for choosing me and [facility].  Pricilla Riffle. Dagoberto Ligas., MD., Marval Regal

## 2021-08-13 ENCOUNTER — Other Ambulatory Visit: Payer: Self-pay | Admitting: Physician Assistant

## 2021-08-13 DIAGNOSIS — M47812 Spondylosis without myelopathy or radiculopathy, cervical region: Secondary | ICD-10-CM | POA: Diagnosis not present

## 2021-08-13 DIAGNOSIS — M150 Primary generalized (osteo)arthritis: Secondary | ICD-10-CM | POA: Diagnosis not present

## 2021-08-13 DIAGNOSIS — G894 Chronic pain syndrome: Secondary | ICD-10-CM | POA: Diagnosis not present

## 2021-08-13 DIAGNOSIS — M47816 Spondylosis without myelopathy or radiculopathy, lumbar region: Secondary | ICD-10-CM | POA: Diagnosis not present

## 2021-08-17 ENCOUNTER — Telehealth: Payer: Self-pay | Admitting: *Deleted

## 2021-08-17 NOTE — Chronic Care Management (AMB) (Signed)
  Care Coordination Note  08/17/2021 Name: [name] MRN: [id number] DOB: 02-04-1955  [name] is a 66 y.o. year old female who is a primary care patient of Fayrene Helper, MD and is actively engaged with the care management team. I reached out to [name] by phone today to assist with re-scheduling a follow up visit with the RN Case Manager  Follow up plan: Unsuccessful telephone outreach attempt made. A HIPAA compliant phone message was left for the patient providing contact information and requesting a return call.  The care management team will reach out to the patient again over the next 7 days.  If patient returns call to provider office, please advise to call [place] at [phone].  [place]  Direct Dial: [phone]

## 2021-08-20 ENCOUNTER — Ambulatory Visit (HOSPITAL_COMMUNITY)
Admission: RE | Admit: 2021-08-20 | Discharge: 2021-08-20 | Disposition: A | Discharge status: Home / Self Care | Payer: Medicare Other | Source: Ambulatory Visit | Attending: Gastroenterology | Admitting: Gastroenterology

## 2021-08-20 DIAGNOSIS — K224 Dyskinesia of esophagus: Secondary | ICD-10-CM | POA: Diagnosis not present

## 2021-08-20 DIAGNOSIS — K219 Gastro-esophageal reflux disease without esophagitis: Secondary | ICD-10-CM | POA: Diagnosis not present

## 2021-08-20 DIAGNOSIS — R1310 Dysphagia, unspecified: Secondary | ICD-10-CM | POA: Diagnosis not present

## 2021-08-20 DIAGNOSIS — K449 Diaphragmatic hernia without obstruction or gangrene: Secondary | ICD-10-CM | POA: Diagnosis not present

## 2021-08-21 ENCOUNTER — Other Ambulatory Visit: Payer: Self-pay

## 2021-08-21 ENCOUNTER — Encounter: Payer: Self-pay | Admitting: Gastroenterology

## 2021-08-21 ENCOUNTER — Telehealth: Payer: Self-pay | Admitting: Family Medicine

## 2021-08-21 MED ORDER — DEXLANSOPRAZOLE 30 MG PO CPDR: DELAYED_RELEASE_CAPSULE | ORAL | 3 refills | Status: DC

## 2021-08-21 NOTE — Telephone Encounter (Signed)
Refills sent

## 2021-08-21 NOTE — Telephone Encounter (Signed)
Patient requesting refill on   Dexlansoprazole 30 MG capsule DR

## 2021-08-27 NOTE — Chronic Care Management (AMB) (Signed)
  Chronic Care Management Note  08/27/2021 Name: [name] MRN: [id number] DOB: 1955/07/31  [name] is a 66 y.o. year old female who is a primary care patient of Fayrene Helper, MD and is actively engaged with the care management team. I reached out to [name] by phone today to assist with re-scheduling a follow up visit with the RN Case Manager  Follow up plan: Unsuccessful telephone outreach attempt made. A HIPAA compliant phone message was left for the patient providing contact information and requesting a return call.  Unable to make contact on outreach attempts x 2. PCP Dr. Moshe Cipro notified via routed documentation in medical record.  We have been unable to make contact with the patient for follow up. The care management team is available to follow up with the patient after provider conversation with the patient regarding recommendation for care management engagement and subsequent re-referral to the care management team.   [place]  Direct Dial: [phone]

## 2021-08-28 ENCOUNTER — Ambulatory Visit: Payer: Self-pay | Admitting: *Deleted

## 2021-08-28 DIAGNOSIS — E1165 Type 2 diabetes mellitus with hyperglycemia: Secondary | ICD-10-CM

## 2021-08-28 DIAGNOSIS — I10 Essential (primary) hypertension: Secondary | ICD-10-CM

## 2021-08-28 NOTE — Chronic Care Management (AMB) (Signed)
   08/28/2021  GRACEANN BOILEAU [birth year] [id number]   Per care guide note on 08/27/21- 3 unsuccessful outreaches made, unable to maintain contact, care plan updated and resolved, case closed.  Jacqlyn Larsen [facility], BSN RN Case Manager [facility] [phone]

## 2021-08-31 ENCOUNTER — Telehealth: Payer: Self-pay | Admitting: Family Medicine

## 2021-08-31 NOTE — Telephone Encounter (Signed)
Patient called in regard to [facility] order.  Patient states that it was sent into wrong pharm.  Patient needs order to be sent I to [employer]

## 2021-09-02 ENCOUNTER — Other Ambulatory Visit: Payer: Self-pay

## 2021-09-02 MED ORDER — DEXLANSOPRAZOLE 30 MG PO CPDR: DELAYED_RELEASE_CAPSULE | ORAL | 3 refills | Status: DC

## 2021-09-07 ENCOUNTER — Encounter: Payer: Self-pay | Admitting: Physician Assistant

## 2021-09-07 ENCOUNTER — Ambulatory Visit: Payer: Medicare Other | Admitting: Physician Assistant

## 2021-09-07 VITALS — BP 128/72 | HR 92 | Ht 68.0 in | Wt 278.0 lb

## 2021-09-07 DIAGNOSIS — E785 Hyperlipidemia, unspecified: Secondary | ICD-10-CM | POA: Diagnosis not present

## 2021-09-07 DIAGNOSIS — R0789 Other chest pain: Secondary | ICD-10-CM

## 2021-09-07 DIAGNOSIS — E119 Type 2 diabetes mellitus without complications: Secondary | ICD-10-CM | POA: Diagnosis not present

## 2021-09-07 DIAGNOSIS — I10 Essential (primary) hypertension: Secondary | ICD-10-CM | POA: Diagnosis not present

## 2021-09-07 NOTE — Progress Notes (Unsigned)
Cardiology Office Note:    Date:  09/09/2021   ID:  [name], DOB 1955-03-05, MRN [id number]  PCP:  Fayrene Helper, MD   [facility] Prov[facility] Cardiologist:  Shelva Majestic, MD     Referring MD: Fayrene Helper, MD   Chief Complaint  Patient presents with   Follow-up    Seen for Dr. Claiborne Billings    History of Present Illness:    [name] is a 66 y.o. female with a hx of HTN, HLD, DM II, and GERD.  Patient was seen for chest discomfort in 2017 when she was under significant stress secondary to family issues.  She has significant family history of CAD.  Echocardiogram obtained on 12/03/2015 showed EF 50 to 55%, grade 1 DD, trivial MR.  Myoview was low risk and showed normal perfusion and function.  She underwent sleep study in July 2018 that revealed increased upper airway resistance syndrome with AHI 3.6/h.  She did not meet CPAP criteria.  Patient was last seen by Dr. Claiborne Billings October 2022 at which time she was doing well.  More recently, patient was seen in the ED on 04/18/2021 with epigastric pain that radiating up her chest.  She described as a burning sensation that is worse after she eats.  She was advised to follow-up with GI service for upper endoscopy.  Chest x-ray normal.  EKG was negative for ischemia.  CT of abdomen and pelvis showed a small hiatal hernia, hepatic steatosis, mild nonspecific mesenteric inflammation fat stranding within the mid to lower left abdomen which may be chronic in nature, aortic atherosclerosis.  Serial troponin negative.  Red blood cell count, renal function and electrolyte were normal.  I last saw the patient in March 2023 at which time she was having intermittent abdominal pain and chest pain that was worse when she tried to swallow food.  The symptoms sounds more GI in nature rather than cardiac, we decided to hold off on ischemic work-up.  Patient presents today for follow-up.  [facility] been working on her  stationary bicycle.  She can exercise for up to 20 minutes without any chest pain.  She still gets chest discomfort and abdominal discomfort several times a month, however symptom usually occurs with stress or depend on the type of food or medication.  I still suspect her atypical chest discomfort and abdominal pain is more related to GI issue.  At this time, I do not recommend any further work-up.   Past Medical History:  Diagnosis Date   Allergy    Anemia    Arthritis    Cataract    forming   COVID-19    Diabetes mellitus without complication ([place]) 2706   GERD (gastroesophageal reflux disease)    History of colon polyps    Hyperlipidemia    Hypertension 2011   Morbid obesity ([place])    Neuromuscular disorder ([place])    issues with nerves in back    Osteopenia    Thyroid disease     Past Surgical History:  Procedure Laterality Date   ABDOMINAL HYSTERECTOMY     APPENDECTOMY     CHOLECYSTECTOMY     COLONOSCOPY  2009   [facility] - normal per pt    TONSILLECTOMY      Current Medications: Current Meds  Medication Sig   aspirin 81 MG tablet Take 81 mg by mouth daily.   benazepril (LOTENSIN) 5 MG tablet Take 1  tablet (5 mg total) by mouth daily.   calcium-vitamin D (OSCAL WITH D) 500-200 MG-UNIT per tablet Take 1 tablet by mouth daily.   cyclobenzaprine (FLEXERIL) 10 MG tablet Take 10 mg by mouth 3 (three) times daily as needed.   Dexlansoprazole 30 MG capsule DR TAKE 1 CAPSULE(30 MG) BY MOUTH TWICE DAILY BEFORE A MEAL   fluticasone (FLONASE) 50 MCG/ACT nasal spray USE 2 SPRAYS IN EACH NOSTRIL ONCE DAILY.  SHAKE GENTLY BEFORE USING. (Patient taking differently: Place 2 sprays into both nostrils daily.)   gabapentin (NEURONTIN) 300 MG capsule TAKE ONE CAPSULE BY MOUTH TWICE DAILY   glipiZIDE (GLIPIZIDE XL) 10 MG 24 hr tablet Take 1 tablet (10 mg total) by mouth daily with breakfast.   glucose blood (ONETOUCH VERIO) test strip USE TO TEST BLOOD SUGAR EVERY DAY   Lancets (ONETOUCH  DELICA PLUS [id number]) MISC USE TO TEST BLOOD SUGAR ONCE DAILY   levothyroxine (SYNTHROID) 150 MCG tablet TAKE ONE TABLET BY MOUTH DAILY.   metFORMIN (GLUCOPHAGE) 1000 MG tablet TAKE ONE TABLET BY MOUTH TWICE DAILY WITH A MEAL   montelukast (SINGULAIR) 10 MG tablet TAKE ONE TABLET BY MOUTH AT BEDTIME   ondansetron (ZOFRAN) 4 MG tablet Take 1 tablet (4 mg total) by mouth every 6 (six) hours as needed for nausea or vomiting.   oxyCODONE-acetaminophen (PERCOCET) 7.5-325 MG tablet Take one tablet once daily , as needed , for uncontrolled back pain   rosuvastatin (CRESTOR) 40 MG tablet Take 1 tablet (40 mg total) by mouth daily.   traZODone (DESYREL) 100 MG tablet TAKE ONE TABLET BY MOUTH AT BEDTIME   Turmeric 500 MG CAPS Take 1 capsule by mouth daily.   venlafaxine XR (EFFEXOR XR) 37.5 MG 24 hr capsule Take 1 capsule (37.5 mg total) by mouth daily with breakfast.   Current Facility-Administered Medications for the 09/07/21 encounter (Office Visit) with Almyra Deforest, PA  Medication   Influenza (>/= 3 years) inactive virus vaccine (FLVIRIN/FLUZONE) injection SUSP 0.5 mL     Allergies:   Ozempic (0.25 or 0.5 mg-dose) [semaglutide(0.25 or 0.'5mg'$ -dos)], Naproxen sodium, Tsh [thyrotropin], and Statins   Social History   Socioeconomic History   Marital status: Married    Spouse name: Sinda Leedom   Number of children: 2   Years of education: 12   Highest education level: 12th grade  Occupational History   Occupation: disabled   Tobacco Use   Smoking status: Never    Passive exposure: Never   Smokeless tobacco: Never  Vaping Use   Vaping Use: Never used  Substance and Sexual Activity   Alcohol use: No   Drug use: No   Sexual activity: Not Currently  Other Topics Concern   Not on file  Social History Narrative   Lives with Husband alone    Social Determinants of Health   Financial Resource Strain: Low Risk  (03/27/2021)   Overall Financial Resource Strain (CARDIA)    Difficulty of Paying  Living Expenses: Not hard at all  Food Insecurity: No Food Insecurity (05/19/2021)   Hunger Vital Sign    Worried About Running Out of Food in the Last Year: Never true    [place] in the Last Year: Never true  Transportation Needs: No Transportation Needs (05/19/2021)   PRAPARE - Hydrologist (Medical): No    Lack of Transportation (Non-Medical): No  Physical Activity: Insufficiently Active (03/27/2021)   Exercise Vital Sign    Days of Exercise per Week:  4 days    Minutes of Exercise per Session: 30 min  Stress: Stress Concern Present (03/27/2021)   [place]    Feeling of Stress : Rather much  Social Connections: Moderately Integrated (03/27/2021)   Social Connection and Isolation Panel [NHANES]    Frequency of Communication with Friends and Family: More than three times a week    Frequency of Social Gatherings with Friends and Family: More than three times a week    Attends Religious Services: More than 4 times per year    Active Member of [employer] or Organizations: No    Attends Archivist Meetings: Never    Marital Status: Married  Recent Concern: Social Connections - Moderately Isolated (02/09/2021)   Social Connection and Isolation Panel [NHANES]    Frequency of Communication with Friends and Family: More than three times a week    Frequency of Social Gatherings with Friends and Family: More than three times a week    Attends Religious Services: More than 4 times per year    Active Member of [employer] or Organizations: No    Attends Music therapist: Never    Marital Status: Separated     Family History: The patient's family history includes Asthma in her mother; Cancer in her maternal grandmother, paternal grandfather, sister, and sister; Diabetes in her father; Heart disease (age of onset: 41) in her mother; Heart disease (age of onset: 62) in her father;  Hypertension in her father and mother; Pancreatic cancer in her mother; Prostate cancer in her paternal grandmother. There is no history of Colon polyps, Colon cancer, Esophageal cancer, Rectal cancer, or Stomach cancer.  ROS:   Please see the history of present illness.     All other systems reviewed and are negative.  EKGs/Labs/Other Studies Reviewed:    The following studies were reviewed today:  Echo 12/03/2015 LV EF: 50% -   55%   -------------------------------------------------------------------  Indications:      Chest pain (R07.2).   -------------------------------------------------------------------  History:   Risk factors:  Vertigo. Hypertension. Diabetes mellitus.  Morbidly obese. Dyslipidemia.   -------------------------------------------------------------------  Study Conclusions   - Left ventricle: The cavity size was normal. Wall thickness was    normal. Systolic function was low normal to mildly reduced. The    estimated ejection fraction was in the range of 50% to 55%. Wall    motion was normal; there were no regional wall motion    abnormalities. Doppler parameters are consistent with abnormal    left ventricular relaxation (grade 1 diastolic dysfunction).  - Aortic valve: There was no stenosis.  - Mitral valve: Mildly calcified annulus. There was trivial    regurgitation.  - Right ventricle: The cavity size was normal. Systolic function    was normal.  - Tricuspid valve: Peak RV-RA gradient (S): 16 mm Hg.  - Pulmonary arteries: PA peak pressure: 19 mm Hg (S).  - Inferior vena cava: The vessel was normal in size. The    respirophasic diameter changes were in the normal range (>= 50%),    consistent with normal central venous pressure.   Impressions:   - Normal LV size with EF 50-55%, low normal to mildly reduced    systolic function. Normal RV size and systolic function. No    significant valvular abnormalities.   EKG:  EKG is ordered today.  The ekg  ordered today demonstrates normal sinus rhythm, no significant ST-T wave changes  Recent Labs:  04/18/2021: Hemoglobin 14.5; Platelets 393 06/18/2021: ALT 17; BUN 11; Creatinine, Ser 0.90; Potassium 4.8; Sodium 141; TSH 0.494  Recent Lipid Panel    Component Value Date/Time   CHOL 118 06/18/2021 0820   TRIG 118 06/18/2021 0820   HDL 45 06/18/2021 0820   CHOLHDL 2.6 06/18/2021 0820   CHOLHDL 2.6 08/08/2019 1100   VLDL 22 03/19/2016 0933   LDLCALC 52 06/18/2021 0820   LDLCALC 66 08/08/2019 1100     Risk Assessment/Calculations:           Physical Exam:    VS:  BP 128/72   Pulse 92   Ht '5\' 8"'$  (1.727 m)   Wt 278 lb (126.1 kg)   SpO2 95%   BMI 42.27 kg/m        Wt Readings from Last 3 Encounters:  09/07/21 278 lb (126.1 kg)  08/11/21 281 lb 6 oz (127.6 kg)  06/23/21 282 lb 12.8 oz (128.3 kg)     GEN:  Well nourished, well developed in no acute distress HEENT: Normal NECK: No JVD; No carotid bruits LYMPHATICS: No lymphadenopathy CARDIAC: RRR, no murmurs, rubs, gallops RESPIRATORY:  Clear to auscultation without rales, wheezing or rhonchi  ABDOMEN: Soft, non-tender, non-distended MUSCULOSKELETAL:  No edema; No deformity  SKIN: Warm and dry NEUROLOGIC:  Alert and oriented x 3 PSYCHIATRIC:  Normal affect   ASSESSMENT:    1. Atypical chest pain   2. Essential hypertension   3. Hyperlipidemia LDL goal <70   4. Controlled type 2 diabetes mellitus without complication, without long-term current use of insulin ([place])    PLAN:    In order of problems listed above:  Atypical chest pain: Symptoms sounds more GI in nature and occurs after food.  At this time I do not recommend any further cardiac work-up.  Patient can exercise up to 20 minutes without exertional symptoms  Hypertension: Blood pressure well controlled  Hyperlipidemia: On Crestor  DM2: Managed by primary care provider.            Medication Adjustments/Labs and Tests Ordered: Current medicines  are reviewed at length with the patient today.  Concerns regarding medicines are outlined above.  Orders Placed This Encounter  Procedures   EKG 12-Lead   No orders of the defined types were placed in this encounter.   Patient Instructions  Medication Instructions:  Your physician recommends that you continue on your current medications as directed. Please refer to the Current Medication list given to you today.  *If you need a refill on your cardiac medications before your next appointment, please call your pharmacy*  Lab Work: NONE ordered at this time of appointment   If you have labs (blood work) drawn today and your tests are completely normal, you will receive your results only by: [place] (if you have MyChart) OR A paper copy in the mail If you have any lab test that is abnormal or we need to change your treatment, we will call you to review the results.  Testing/Procedures: NONE ordered at this time of appointment   Follow-Up: At [facility], you and your health needs are our priority.  As part of our continuing mission to provide you with exceptional heart care, we have created designated Provider Care Teams.  These Care Teams include your primary Cardiologist (physician) and Advanced Practice Providers (APPs -  Physician Assistants and Nurse Practitioners) who all work together to provide you with the care you need, when you need it.  Your next appointment:  6 month(s)  The format for your next appointment:   In Person  Provider:   Shelva Majestic, MD    Other Instructions   Important Information About Sugar         Hilbert Corrigan, [place]  09/09/2021 11:50 PM    [place]

## 2021-09-07 NOTE — Patient Instructions (Signed)
Medication Instructions:  Your physician recommends that you continue on your current medications as directed. Please refer to the Current Medication list given to you today.  *If you need a refill on your cardiac medications before your next appointment, please call your pharmacy*  Lab Work: NONE ordered at this time of appointment   If you have labs (blood work) drawn today and your tests are completely normal, you will receive your results only by: [place] (if you have MyChart) OR A paper copy in the mail If you have any lab test that is abnormal or we need to change your treatment, we will call you to review the results.  Testing/Procedures: NONE ordered at this time of appointment   Follow-Up: At [facility], you and your health needs are our priority.  As part of our continuing mission to provide you with exceptional heart care, we have created designated Provider Care Teams.  These Care Teams include your primary Cardiologist (physician) and Advanced Practice Providers (APPs -  Physician Assistants and Nurse Practitioners) who all work together to provide you with the care you need, when you need it.  Your next appointment:   6 month(s)  The format for your next appointment:   In Person  Provider:   Shelva Majestic, MD    Other Instructions   Important Information About Sugar

## 2021-09-09 ENCOUNTER — Encounter: Payer: Self-pay | Admitting: Physician Assistant

## 2021-09-21 ENCOUNTER — Telehealth: Payer: Self-pay | Admitting: Family Medicine

## 2021-09-21 NOTE — Telephone Encounter (Signed)
Pt called stating she is wanting to know if the medication that was supposed to be sent to the office for her has arrived?

## 2021-09-21 NOTE — Telephone Encounter (Signed)
Pt waiting on dexilant to be sent to office

## 2021-09-29 ENCOUNTER — Telehealth: Payer: Self-pay

## 2021-09-29 NOTE — Telephone Encounter (Signed)
Patient called asking when her medicine be delivered  our office? Does provider have any samples to pick up, stomach is killing her.  This is usually shipped to our office : Dexilant 30 mg

## 2021-09-29 NOTE — Telephone Encounter (Signed)
I called [name] and her application expired in June and they will fax over another copy and we complete our section and she will need to complete her part and provider her income information.

## 2021-10-05 ENCOUNTER — Other Ambulatory Visit: Payer: Self-pay | Admitting: Family Medicine

## 2021-10-05 DIAGNOSIS — E1165 Type 2 diabetes mellitus with hyperglycemia: Secondary | ICD-10-CM

## 2021-10-09 ENCOUNTER — Other Ambulatory Visit: Payer: Self-pay

## 2021-10-09 ENCOUNTER — Telehealth: Payer: Self-pay | Admitting: Family Medicine

## 2021-10-09 MED ORDER — ONETOUCH DELICA PLUS LANCET33G MISC: 5 refills | Status: DC

## 2021-10-09 NOTE — Telephone Encounter (Signed)
Rx sent 

## 2021-10-09 NOTE — Telephone Encounter (Signed)
[name], [facility], [phone]  Called wanting to know if a RX can be sent in?     ONETOUCH DELICA PLUS LANCING DEVICE

## 2021-10-15 ENCOUNTER — Telehealth: Payer: Self-pay | Admitting: Family Medicine

## 2021-10-15 NOTE — Telephone Encounter (Signed)
Patient aware medication has not been delivered yet

## 2021-10-15 NOTE — Telephone Encounter (Signed)
Pt called wanting to know if her medication has came in?

## 2021-10-21 DIAGNOSIS — I10 Essential (primary) hypertension: Secondary | ICD-10-CM | POA: Diagnosis not present

## 2021-10-21 DIAGNOSIS — E1165 Type 2 diabetes mellitus with hyperglycemia: Secondary | ICD-10-CM | POA: Diagnosis not present

## 2021-10-23 LAB — MICROALBUMIN / CREATININE URINE RATIO
Creatinine, Urine: 97 mg/dL
Microalb/Creat Ratio: <3 mg/g creat (ref 0–29)
Microalbumin, Urine: <3 ug/mL

## 2021-10-23 LAB — BMP8+EGFR
BUN/Creatinine Ratio: 18 (ref 12–28)
BUN: 17 mg/dL (ref 8–27)
CO2: 23 mmol/L (ref 20–29)
Calcium: 10.1 mg/dL (ref 8.7–10.3)
Chloride: 101 mmol/L (ref 96–106)
Creatinine, Ser: 0.92 mg/dL (ref 0.57–1.00)
Glucose: 152 mg/dL — ABNORMAL HIGH (ref 70–99)
Potassium: 5 mmol/L (ref 3.5–5.2)
Sodium: 140 mmol/L (ref 134–144)
eGFR: 69 mL/min/{1.73_m2} (ref >59)

## 2021-10-23 LAB — HEMOGLOBIN A1C
Est. average glucose Bld gHb Est-mCnc: 160 mg/dL
Hgb A1c MFr Bld: 7.2 % — ABNORMAL HIGH (ref 4.8–5.6)

## 2021-10-27 ENCOUNTER — Ambulatory Visit (INDEPENDENT_AMBULATORY_CARE_PROVIDER_SITE_OTHER): Payer: Medicare Other | Admitting: Family Medicine

## 2021-10-27 ENCOUNTER — Encounter: Payer: Self-pay | Admitting: Family Medicine

## 2021-10-27 VITALS — BP 110/74 | HR 87 | Ht 68.0 in | Wt 278.0 lb

## 2021-10-27 DIAGNOSIS — Z23 Encounter for immunization: Secondary | ICD-10-CM | POA: Diagnosis not present

## 2021-10-27 DIAGNOSIS — M5410 Radiculopathy, site unspecified: Secondary | ICD-10-CM

## 2021-10-27 DIAGNOSIS — G894 Chronic pain syndrome: Secondary | ICD-10-CM

## 2021-10-27 DIAGNOSIS — I10 Essential (primary) hypertension: Secondary | ICD-10-CM

## 2021-10-27 DIAGNOSIS — E1169 Type 2 diabetes mellitus with other specified complication: Secondary | ICD-10-CM | POA: Diagnosis not present

## 2021-10-27 DIAGNOSIS — S39012A Strain of muscle, fascia and tendon of lower back, initial encounter: Secondary | ICD-10-CM

## 2021-10-27 DIAGNOSIS — L819 Disorder of pigmentation, unspecified: Secondary | ICD-10-CM | POA: Diagnosis not present

## 2021-10-27 DIAGNOSIS — E039 Hypothyroidism, unspecified: Secondary | ICD-10-CM

## 2021-10-27 MED ORDER — METHYLPREDNISOLONE ACETATE 80 MG/ML IJ SUSP
40.0000 mg | Freq: Once | INTRAMUSCULAR | Status: AC
  Administered 2021-10-27: 40 mg via INTRAMUSCULAR

## 2021-10-27 MED ORDER — PREDNISONE 10 MG PO TABS: 10.0000 mg | ORAL_TABLET | Freq: Two times a day (BID) | ORAL | 0 refills | Status: DC

## 2021-10-27 NOTE — Patient Instructions (Addendum)
Annual exam in office 12/17 or shortly after , cll if you need me sooner  Depo medrol 40 mg IM in office today for back pain followed by a 5 day course of prednisone  Flu vaccine today  Labs will be ordered at your December visit to be drawn in January  It is important that you exercise regularly at least 30 minutes 5 times a week. If you develop chest pain, have severe difficulty breathing, or feel very tired, stop exercising immediately and seek medical attention   Please reduce sweets/ cookies, blood sugar has increased slightly  Check feet daily, you have reduced sensation   You are referred to  Dermatology re mole, which is enlarging  Thanks for choosing [facility], we consider it a privelige to serve you.

## 2021-10-30 DIAGNOSIS — E1169 Type 2 diabetes mellitus with other specified complication: Secondary | ICD-10-CM | POA: Insufficient documentation

## 2021-10-30 DIAGNOSIS — L819 Disorder of pigmentation, unspecified: Secondary | ICD-10-CM | POA: Insufficient documentation

## 2021-10-30 NOTE — Progress Notes (Signed)
[name]     MRN: [id number]      DOB: [birth year]   HPI [name] is here for follow up and re-evaluation of chronic medical conditions, medication management and review of any available recent lab and radiology data.  Preventive health is updated, specifically  Cancer screening and Immunization.   Questions or concerns regarding consultations or procedures which the PT has had in the interim are  addressed. The PT denies any adverse reactions to current medications since the last visit.  C/o mole on mons pubis  which is increasing in size over the past several months  ROS Denies recent fever or chills. Denies sinus pressure, nasal congestion, ear pain or sore throat. Denies chest congestion, productive cough or wheezing. Denies chest pains, palpitations and leg swelling Denies abdominal pain, nausea, vomiting,diarrhea or constipation.   Denies dysuria, frequency, hesitancy or incontinence. Denies joint pain, swelling and limitation in mobility. Denies headaches, seizures, numbness, or tingling. Denies depression, anxiety or insomnia. Denies skin break down or rash.   PE  BP 110/74 (BP Location: Right Arm, Patient Position: Sitting, Cuff Size: Large)   Pulse 87   Ht '5\' 8"'$  (1.727 m)   Wt 278 lb 0.6 oz (126.1 kg)   SpO2 95%   BMI 42.28 kg/m   Patient alert and oriented and in no cardiopulmonary distress.  HEENT: No facial asymmetry, EOMI,     Neck supple .  Chest: Clear to auscultation bilaterally.  CVS: S1, S2 no murmurs, no S3.Regular rate.  ABD: Soft non tender.   Ext: No edema  MS: Adequate ROM spine, shoulders, hips and knees.  Skin: Intact, no ulcerations or rash noted.  Psych: Good eye contact, normal affect. Memory intact not anxious or depressed appearing.  CNS: CN 2-12 intact, power,  normal throughout.no focal deficits noted.   Assessment & Plan  Back pain with radiculopathy Increased and uncontrolled pain, depo medrol 40 mgl in office  followed by 5 day course of oral prednisone  Type 2 diabetes mellitus with hyperglycemia ([place]) [name] is reminded of the importance of commitment to daily physical activity for 30 minutes or more, as able and the need to limit carbohydrate intake to 30 to 60 grams per meal to help with blood sugar control.   The need to take medication as prescribed, test blood sugar as directed, and to call between visits if there is a concern that blood sugar is uncontrolled is also discussed.   [name] is reminded of the importance of daily foot exam, annual eye examination, and good blood sugar, blood pressure and cholesterol control.     Latest Ref Rng & Units 10/21/2021    8:14 AM 06/18/2021    8:20 AM 04/18/2021    5:07 PM 03/13/2021    8:03 AM 11/11/2020    9:22 AM  Diabetic Labs  HbA1c 4.8 - 5.6 % 7.2  6.8   7.8  7.7   Micro/Creat Ratio 0 - 29 mg/g creat <3       Chol 100 - 199 mg/dL  118    128   HDL >39 mg/dL  45    49   Calc LDL 0 - 99 mg/dL  52    54   Triglycerides 0 - 149 mg/dL  118    145   Creatinine 0.57 - 1.00 mg/dL 0.92  0.90  0.95  0.92  0.90       10/27/2021    8:32 AM 09/07/2021  2:04 PM 08/11/2021    8:24 AM 06/23/2021    1:23 PM 05/26/2021   10:36 AM 05/26/2021   10:26 AM 05/26/2021   10:16 AM  BP/Weight  Systolic BP 734 193 790 240 973 532 91  Diastolic BP 74 72 72 78 73 64 58  Wt. (Lbs) 278.04 278 281.38 282.8     BMI 42.28 kg/m2 42.27 kg/m2 42.78 kg/m2 43 kg/m2         10/27/2021    8:20 AM 01/09/2021   10:00 AM  Foot/eye exam completion dates  Foot Form Completion Done Done        Type 2 diabetes mellitus with other specified complication ([place]) Deteriorated not at goal [name] is reminded of the importance of commitment to daily physical activity for 30 minutes or more, as able and the need to limit carbohydrate intake to 30 to 60 grams per meal to help with blood sugar control.   The need to take medication as prescribed, test blood sugar as directed,  and to call between visits if there is a concern that blood sugar is uncontrolled is also discussed.   [name] is reminded of the importance of daily foot exam, annual eye examination, and good blood sugar, blood pressure and cholesterol control.     Latest Ref Rng & Units 10/21/2021    8:14 AM 06/18/2021    8:20 AM 04/18/2021    5:07 PM 03/13/2021    8:03 AM 11/11/2020    9:22 AM  Diabetic Labs  HbA1c 4.8 - 5.6 % 7.2  6.8   7.8  7.7   Micro/Creat Ratio 0 - 29 mg/g creat <3       Chol 100 - 199 mg/dL  118    128   HDL >39 mg/dL  45    49   Calc LDL 0 - 99 mg/dL  52    54   Triglycerides 0 - 149 mg/dL  118    145   Creatinine 0.57 - 1.00 mg/dL 0.92  0.90  0.95  0.92  0.90       10/27/2021    8:32 AM 09/07/2021    2:04 PM 08/11/2021    8:24 AM 06/23/2021    1:23 PM 05/26/2021   10:36 AM 05/26/2021   10:26 AM 05/26/2021   10:16 AM  BP/Weight  Systolic BP 992 426 834 196 222 979 91  Diastolic BP 74 72 72 78 73 64 58  Wt. (Lbs) 278.04 278 281.38 282.8     BMI 42.28 kg/m2 42.27 kg/m2 42.78 kg/m2 43 kg/m2         10/27/2021    8:20 AM 01/09/2021   10:00 AM  Foot/eye exam completion dates  Foot Form Completion Done Done        Morbid obesity  Patient re-educated about  the importance of commitment to a  minimum of 150 minutes of exercise per week as able.  The importance of healthy food choices with portion control discussed, as well as eating regularly and within a 12 hour window most days. The need to choose "clean , green" food 50 to 75% of the time is discussed, as well as to make water the primary drink and set a goal of 64 ounces water daily.       10/27/2021    8:32 AM 09/07/2021    2:04 PM 08/11/2021    8:24 AM  Weight /BMI  Weight 278 lb 0.6 oz 278 lb 281 lb 6  oz  Height '5\' 8"'$  (1.727 m) '5\' 8"'$  (1.727 m) '5\' 8"'$  (1.727 m)  BMI 42.28 kg/m2 42.27 kg/m2 42.78 kg/m2      Hypertension goal BP (blood pressure) < 130/80 DASH diet and commitment to daily physical activity  for a minimum of 30 minutes discussed and encouraged, as a part of hypertension management. The importance of attaining a healthy weight is also discussed.     10/27/2021    8:32 AM 09/07/2021    2:04 PM 08/11/2021    8:24 AM 06/23/2021    1:23 PM 05/26/2021   10:36 AM 05/26/2021   10:26 AM 05/26/2021   10:16 AM  BP/Weight  Systolic BP 706 237 628 315 176 160 91  Diastolic BP 74 72 72 78 73 64 58  Wt. (Lbs) 278.04 278 281.38 282.8     BMI 42.28 kg/m2 42.27 kg/m2 42.78 kg/m2 43 kg/m2      Controlled, no change in medication     Chronic pain Generally well controlled, managed by pain clinic  Hypothyroidism Controlled, no change in medication   Atypical pigmented lesion Pigmented lesion on =mons pubis  enlarging in size over past several  Months, refer Derm

## 2021-10-30 NOTE — Assessment & Plan Note (Signed)
Controlled, no change in medication  

## 2021-10-30 NOTE — Assessment & Plan Note (Addendum)
Pigmented lesion on =mons pubis  enlarging in size over past several  Months, refer Derm

## 2021-10-30 NOTE — Assessment & Plan Note (Signed)
Increased and uncontrolled pain, depo medrol 40 mgl in office followed by 5 day course of oral prednisone

## 2021-10-30 NOTE — Assessment & Plan Note (Signed)
DASH diet and commitment to daily physical activity for a minimum of 30 minutes discussed and encouraged, as a part of hypertension management. The importance of attaining a healthy weight is also discussed.     10/27/2021    8:32 AM 09/07/2021    2:04 PM 08/11/2021    8:24 AM 06/23/2021    1:23 PM 05/26/2021   10:36 AM 05/26/2021   10:26 AM 05/26/2021   10:16 AM  BP/Weight  Systolic BP 848 350 757 322 567 209 91  Diastolic BP 74 72 72 78 73 64 58  Wt. (Lbs) 278.04 278 281.38 282.8     BMI 42.28 kg/m2 42.27 kg/m2 42.78 kg/m2 43 kg/m2      Controlled, no change in medication

## 2021-10-30 NOTE — Assessment & Plan Note (Signed)
Deteriorated not at goal [name] is reminded of the importance of commitment to daily physical activity for 30 minutes or more, as able and the need to limit carbohydrate intake to 30 to 60 grams per meal to help with blood sugar control.   The need to take medication as prescribed, test blood sugar as directed, and to call between visits if there is a concern that blood sugar is uncontrolled is also discussed.   [name] is reminded of the importance of daily foot exam, annual eye examination, and good blood sugar, blood pressure and cholesterol control.     Latest Ref Rng & Units 10/21/2021    8:14 AM 06/18/2021    8:20 AM 04/18/2021    5:07 PM 03/13/2021    8:03 AM 11/11/2020    9:22 AM  Diabetic Labs  HbA1c 4.8 - 5.6 % 7.2  6.8   7.8  7.7   Micro/Creat Ratio 0 - 29 mg/g creat <3       Chol 100 - 199 mg/dL  118    128   HDL >39 mg/dL  45    49   Calc LDL 0 - 99 mg/dL  52    54   Triglycerides 0 - 149 mg/dL  118    145   Creatinine 0.57 - 1.00 mg/dL 0.92  0.90  0.95  0.92  0.90       10/27/2021    8:32 AM 09/07/2021    2:04 PM 08/11/2021    8:24 AM 06/23/2021    1:23 PM 05/26/2021   10:36 AM 05/26/2021   10:26 AM 05/26/2021   10:16 AM  BP/Weight  Systolic BP 791 505 697 948 016 553 91  Diastolic BP 74 72 72 78 73 64 58  Wt. (Lbs) 278.04 278 281.38 282.8     BMI 42.28 kg/m2 42.27 kg/m2 42.78 kg/m2 43 kg/m2         10/27/2021    8:20 AM 01/09/2021   10:00 AM  Foot/eye exam completion dates  Foot Form Completion Done Done

## 2021-10-30 NOTE — Assessment & Plan Note (Signed)
Generally well controlled, managed by pain clinic

## 2021-10-30 NOTE — Assessment & Plan Note (Signed)
  Patient re-educated about  the importance of commitment to a  minimum of 150 minutes of exercise per week as able.  The importance of healthy food choices with portion control discussed, as well as eating regularly and within a 12 hour window most days. The need to choose "clean , green" food 50 to 75% of the time is discussed, as well as to make water the primary drink and set a goal of 64 ounces water daily.       10/27/2021    8:32 AM 09/07/2021    2:04 PM 08/11/2021    8:24 AM  Weight /BMI  Weight 278 lb 0.6 oz 278 lb 281 lb 6 oz  Height '5\' 8"'$  (1.727 m) '5\' 8"'$  (1.727 m) '5\' 8"'$  (1.727 m)  BMI 42.28 kg/m2 42.27 kg/m2 42.78 kg/m2

## 2021-10-30 NOTE — Assessment & Plan Note (Signed)
[name] is reminded of the importance of commitment to daily physical activity for 30 minutes or more, as able and the need to limit carbohydrate intake to 30 to 60 grams per meal to help with blood sugar control.   The need to take medication as prescribed, test blood sugar as directed, and to call between visits if there is a concern that blood sugar is uncontrolled is also discussed.   [name] is reminded of the importance of daily foot exam, annual eye examination, and good blood sugar, blood pressure and cholesterol control.     Latest Ref Rng & Units 10/21/2021    8:14 AM 06/18/2021    8:20 AM 04/18/2021    5:07 PM 03/13/2021    8:03 AM 11/11/2020    9:22 AM  Diabetic Labs  HbA1c 4.8 - 5.6 % 7.2  6.8   7.8  7.7   Micro/Creat Ratio 0 - 29 mg/g creat <3       Chol 100 - 199 mg/dL  118    128   HDL >39 mg/dL  45    49   Calc LDL 0 - 99 mg/dL  52    54   Triglycerides 0 - 149 mg/dL  118    145   Creatinine 0.57 - 1.00 mg/dL 0.92  0.90  0.95  0.92  0.90       10/27/2021    8:32 AM 09/07/2021    2:04 PM 08/11/2021    8:24 AM 06/23/2021    1:23 PM 05/26/2021   10:36 AM 05/26/2021   10:26 AM 05/26/2021   10:16 AM  BP/Weight  Systolic BP 400 867 619 509 326 712 91  Diastolic BP 74 72 72 78 73 64 58  Wt. (Lbs) 278.04 278 281.38 282.8     BMI 42.28 kg/m2 42.27 kg/m2 42.78 kg/m2 43 kg/m2         10/27/2021    8:20 AM 01/09/2021   10:00 AM  Foot/eye exam completion dates  Foot Form Completion Done Done

## 2021-11-03 ENCOUNTER — Telehealth: Payer: Self-pay | Admitting: Family Medicine

## 2021-11-03 ENCOUNTER — Other Ambulatory Visit: Payer: Self-pay

## 2021-11-03 DIAGNOSIS — L819 Disorder of pigmentation, unspecified: Secondary | ICD-10-CM

## 2021-11-03 NOTE — Telephone Encounter (Signed)
Pt called stating her referral was sent to [facility]. She is wanting it resent to [facility]. Can you please send to [facility]?

## 2021-11-03 NOTE — Telephone Encounter (Signed)
Referral entered to [facility] per patient request

## 2021-11-10 DIAGNOSIS — E1136 Type 2 diabetes mellitus with diabetic cataract: Secondary | ICD-10-CM | POA: Diagnosis not present

## 2021-11-10 DIAGNOSIS — H524 Presbyopia: Secondary | ICD-10-CM | POA: Diagnosis not present

## 2021-11-10 DIAGNOSIS — H25813 Combined forms of age-related cataract, bilateral: Secondary | ICD-10-CM | POA: Diagnosis not present

## 2021-11-10 DIAGNOSIS — Z7984 Long term (current) use of oral hypoglycemic drugs: Secondary | ICD-10-CM | POA: Diagnosis not present

## 2021-11-25 ENCOUNTER — Ambulatory Visit (HOSPITAL_COMMUNITY)
Admission: RE | Admit: 2021-11-25 | Discharge: 2021-11-25 | Disposition: A | Discharge status: Home / Self Care | Payer: Medicare Other | Source: Ambulatory Visit | Attending: Family Medicine | Admitting: Family Medicine

## 2021-11-25 ENCOUNTER — Encounter: Payer: Self-pay | Admitting: Gastroenterology

## 2021-11-25 ENCOUNTER — Ambulatory Visit: Payer: Medicare Other | Admitting: Gastroenterology

## 2021-11-25 ENCOUNTER — Ambulatory Visit (INDEPENDENT_AMBULATORY_CARE_PROVIDER_SITE_OTHER): Payer: Medicare Other | Admitting: Family Medicine

## 2021-11-25 VITALS — BP 120/82

## 2021-11-25 VITALS — BP 126/80 | HR 92 | Ht 68.0 in | Wt 278.2 lb

## 2021-11-25 DIAGNOSIS — R079 Chest pain, unspecified: Secondary | ICD-10-CM | POA: Diagnosis not present

## 2021-11-25 DIAGNOSIS — R1310 Dysphagia, unspecified: Secondary | ICD-10-CM | POA: Diagnosis not present

## 2021-11-25 DIAGNOSIS — K219 Gastro-esophageal reflux disease without esophagitis: Secondary | ICD-10-CM

## 2021-11-25 DIAGNOSIS — R051 Acute cough: Secondary | ICD-10-CM | POA: Diagnosis not present

## 2021-11-25 DIAGNOSIS — K760 Fatty (change of) liver, not elsewhere classified: Secondary | ICD-10-CM

## 2021-11-25 DIAGNOSIS — R059 Cough, unspecified: Secondary | ICD-10-CM | POA: Diagnosis not present

## 2021-11-25 DIAGNOSIS — R509 Fever, unspecified: Secondary | ICD-10-CM | POA: Diagnosis not present

## 2021-11-25 MED ORDER — PROMETHAZINE-DM 6.25-15 MG/5ML PO SYRP: ORAL_SOLUTION | ORAL | 0 refills | Status: DC

## 2021-11-25 MED ORDER — AZITHROMYCIN 250 MG PO TABS: ORAL_TABLET | ORAL | 0 refills | Status: AC

## 2021-11-25 MED ORDER — BENZONATATE 100 MG PO CAPS: 100.0000 mg | ORAL_CAPSULE | Freq: Two times a day (BID) | ORAL | 0 refills | Status: DC | PRN

## 2021-11-25 NOTE — Progress Notes (Unsigned)
Virtual Visit via Telephone Note  I connected with [name] on 11/25/21 at  4:40 PM EDT by telephone and verified that I am speaking with the correct person using two identifiers.  Location: Patient: home Provider: office   I discussed the limitations, risks, security and privacy concerns of performing an evaluation and management service by telephone and the availability of in person appointments. I also discussed with the patient that there may be a patient responsible charge related to this service. The patient expressed understanding and agreed to proceed.   History of Present Illness:   3 day h/o cough, chest congestion and yellow sputum, temp up to 101. Negative covid test Observations/Objective: Good communication with no confusion and intact memory. Alert and oriented x 3 Cough excessively with mild signs of respiratory distress during speech   Assessment and Plan:  Acute cough, 3 meds prescribed as documented , she is to call back or come in for visit if worsens or fails to improve  Follow Up Instructions:    I discussed the assessment and treatment plan with the patient. The patient was provided an opportunity to ask questions and all were answered. The patient agreed with the plan and demonstrated an understanding of the instructions.   The patient was advised to call back or seek an in-person evaluation if the symptoms worsen or if the condition fails to improve as anticipated.  I provided 12 minutes of non-face-to-face time during this encounter.   Tula Nakayama, MD '

## 2021-11-25 NOTE — Patient Instructions (Signed)
We changed your colonoscopy recall to 11/2024.  The [facility] providers would like to encourage you to use [facility] to communicate with providers for non-urgent requests or questions.  Due to long hold times on the telephone, sending your provider a message by [facility] may be a faster and more efficient way to get a response.  Please allow 48 business hours for a response.  Please remember that this is for non-urgent requests.   Thank you for choosing me and [facility].  Pricilla Riffle. Dagoberto Ligas., MD., Marval Regal

## 2021-11-25 NOTE — Patient Instructions (Signed)
F/U as before , call if you need me sooner  You are treated for ACUTE BRONCHITIS , 3 MEDS ARE AT YOUR PHARMACY, START TODAY PLEASE  CXR AT [facility]

## 2021-11-25 NOTE — Progress Notes (Signed)
    Assessment     GERD, hiatal hernia, mild esophageal dysmotility, mild dysphagia Constipation Hepatic steatosis Personal history of adenomatous colon polyps Right flank / right abdominal pain, musculoskeletal, much improved   Recommendations    Continue antireflux measures and change Dexilant to 60 mg every morning. Increase MiraLAX to 1-2 scoops daily titrated for adequate bowel movement Long-term carb modified, fat modified weight loss diet supervised by her PCP Change colonoscopy recall to a 7-year interval due in November 2026 REV in 1 year.   HPI    This is a 66 year old female with GERD, dysphagia, constipation.  Her reflux symptoms significantly improved on Dexilant.  She still has rare episodes of dysphagia with pills and some solids however this has improved.  She relates 1 episode of right flank pain since her last visit.  Constipation is under adequate control.   Labs / Imaging   Barium esophagram 08/20/2021:   1.  Small hiatal hernia. 2.  Mild dysmotility 3.  Mild reflux to the level of the distal esophagus.      Latest Ref Rng & Units 06/18/2021    8:20 AM 04/18/2021    5:07 PM 11/11/2020    9:22 AM  Hepatic Function  Total Protein 6.0 - 8.5 g/dL 6.4  7.6  6.6   Albumin 3.8 - 4.8 g/dL 4.0  4.0  4.1   AST 0 - 40 IU/L 22  37  24   ALT 0 - 32 IU/L _0 Alk Phosphatase 44 - 121 IU/L 67  65  82   Total Bilirubin 0.0 - 1.2 mg/dL 0.4  0.5  0.5        Latest Ref Rng & Units 04/18/2021    5:07 PM 11/11/2020    9:22 AM 12/07/2018    9:05 AM  CBC  WBC 4.0 - 10.5 K/uL 10.8  9.0  9.5   Hemoglobin 12.0 - 15.0 g/dL 14.5  13.5  13.7   Hematocrit 36.0 - 46.0 % 44.8  40.4  41.3   Platelets 150 - 400 K/uL 393  336  403      Current Medications, Allergies, Past Medical History, Past Surgical History, Family History and Social History were reviewed in [employer] record.   Physical Exam: General: Well developed, well nourished, no  acute distress Head: Normocephalic and atraumatic Eyes: Sclerae anicteric, EOMI Ears: Normal auditory acuity Mouth: Not examined Lungs: Clear throughout to auscultation Heart: Regular rate and rhythm; no murmurs, rubs or bruits Abdomen: Soft, non tender and non distended. No masses, hepatosplenomegaly or hernias noted. Normal Bowel sounds Rectal: Not done Musculoskeletal: Symmetrical with no gross deformities  Pulses:  Normal pulses noted Extremities: No clubbing, cyanosis, edema or deformities noted Neurological: Alert oriented x 4, grossly nonfocal Psychological:  Alert and cooperative. Normal mood and affect   Ismael Treptow T. Fuller Plan, MD 11/25/2021, 8:16 AM

## 2021-11-26 ENCOUNTER — Encounter: Payer: Self-pay | Admitting: Family Medicine

## 2021-11-26 DIAGNOSIS — R051 Acute cough: Secondary | ICD-10-CM | POA: Insufficient documentation

## 2021-11-26 NOTE — Assessment & Plan Note (Signed)
Z pack, tessalon perle nd phenergan dM prescribed , also needs CXR, call if worsens or fails to improve

## 2021-11-26 NOTE — Assessment & Plan Note (Signed)
Z pack, tessalon perle and phenergan dM prescribed and needs CXR

## 2021-12-12 ENCOUNTER — Other Ambulatory Visit: Payer: Self-pay | Admitting: Family Medicine

## 2021-12-21 ENCOUNTER — Telehealth: Payer: Self-pay | Admitting: Family Medicine

## 2021-12-21 NOTE — Telephone Encounter (Signed)
The patient application was sent in Sept for dexillant. This needs to be refaxed

## 2021-12-21 NOTE — Telephone Encounter (Signed)
Patient called in regard to [employer] they need new application for med assistance.

## 2022-01-05 ENCOUNTER — Other Ambulatory Visit: Payer: Self-pay | Admitting: Family Medicine

## 2022-01-05 DIAGNOSIS — D692 Other nonthrombocytopenic purpura: Secondary | ICD-10-CM | POA: Diagnosis not present

## 2022-01-05 DIAGNOSIS — L814 Other melanin hyperpigmentation: Secondary | ICD-10-CM | POA: Diagnosis not present

## 2022-01-05 DIAGNOSIS — D225 Melanocytic nevi of trunk: Secondary | ICD-10-CM | POA: Diagnosis not present

## 2022-01-05 DIAGNOSIS — L820 Inflamed seborrheic keratosis: Secondary | ICD-10-CM | POA: Diagnosis not present

## 2022-01-06 ENCOUNTER — Other Ambulatory Visit: Payer: Self-pay | Admitting: Family Medicine

## 2022-01-06 ENCOUNTER — Other Ambulatory Visit: Payer: Self-pay

## 2022-01-06 MED ORDER — ROSUVASTATIN CALCIUM 40 MG PO TABS: 40.0000 mg | ORAL_TABLET | Freq: Every day | ORAL | 3 refills | Status: DC

## 2022-01-07 ENCOUNTER — Other Ambulatory Visit: Payer: Self-pay

## 2022-01-07 ENCOUNTER — Ambulatory Visit: Payer: Medicare Other | Admitting: Family Medicine

## 2022-01-07 DIAGNOSIS — N3090 Cystitis, unspecified without hematuria: Secondary | ICD-10-CM | POA: Diagnosis not present

## 2022-01-07 DIAGNOSIS — N3091 Cystitis, unspecified with hematuria: Secondary | ICD-10-CM | POA: Diagnosis not present

## 2022-01-07 LAB — POCT URINALYSIS DIP (CLINITEK)
Bilirubin, UA: NEGATIVE
Glucose, UA: NEGATIVE mg/dL
Ketones, POC UA: NEGATIVE mg/dL
Leukocytes, UA: NEGATIVE
Nitrite, UA: NEGATIVE
POC PROTEIN,UA: NEGATIVE
Spec Grav, UA: 1.01 (ref 1.010–1.025)
Urobilinogen, UA: 0.2 E.U./dL
pH, UA: 5.5 (ref 5.0–8.0)

## 2022-01-07 NOTE — Progress Notes (Signed)
Virtual Visit via Telephone Note  I connected with BARBARANN KELLY on 01/07/22 at  3:00 PM EST by telephone and verified that I am speaking with the correct person using two identifiers.  Location: Patient: home Provider: office   I discussed the limitations, risks, security and privacy concerns of performing an evaluation and management service by telephone and the availability of in person appointments. I also discussed with the patient that there may be a patient responsible charge related to this service. The patient expressed understanding and agreed to proceed.   History of Present Illness:, poor urinary  stream x 2 days, denies fevr, chills, flank pain, concerned that she has a UTI  Observations/Objective:  CCUA : tace lysed blood Assessment and Plan: Cystitis with hematuria Symptomatic with abn CCUA, treat presumptively and f/u c/S   Follow Up Instructions:    I discussed the assessment and treatment plan with the patient. The patient was provided an opportunity to ask questions and all were answered. The patient agreed with the plan and demonstrated an understanding of the instructions.   The patient was advised to call back or seek an in-person evaluation if the symptoms worsen or if the condition fails to improve as anticipated.  I provided 12 minutes of non-face-to-face time during this encounter.   Tula Nakayama, MD

## 2022-01-07 NOTE — Patient Instructions (Signed)
F/u as before , call if ypou need me sooner  Three day antibi, keflex, is prescribed , and we will f/u on culture and contact you next week  If stream continues to be a problem and if no infection to explain the blood , you will be referred to Urology  Thanks for choosing [facility], we consider it a privelige to serve you.

## 2022-01-08 ENCOUNTER — Telehealth: Payer: Self-pay | Admitting: Family Medicine

## 2022-01-08 MED ORDER — CEPHALEXIN 500 MG PO CAPS: 500.0000 mg | ORAL_CAPSULE | Freq: Two times a day (BID) | ORAL | 0 refills | Status: DC

## 2022-01-08 NOTE — Telephone Encounter (Signed)
Patient has not received meds from visit on 12/14

## 2022-01-08 NOTE — Telephone Encounter (Signed)
Medication is sent and she is aware

## 2022-01-10 ENCOUNTER — Encounter: Payer: Self-pay | Admitting: Family Medicine

## 2022-01-10 DIAGNOSIS — N3091 Cystitis, unspecified with hematuria: Secondary | ICD-10-CM | POA: Insufficient documentation

## 2022-01-10 NOTE — Assessment & Plan Note (Signed)
Symptomatic with abn CCUA, treat presumptively and f/u c/S

## 2022-01-11 LAB — URINE CULTURE: Organism ID, Bacteria: NO GROWTH

## 2022-01-12 ENCOUNTER — Telehealth: Payer: Self-pay | Admitting: Family Medicine

## 2022-01-12 NOTE — Telephone Encounter (Signed)
I  spoke with pt states she continues to have constant pain just above area where appendix was removed and radiates to her back, rated at  2 now, was a 4, can barely stand straight, pain up to 4 or 5 Had nausea yesterday, had nausea when symptoms initially started on 12/01, has never vomited, was awakened from her sleep on 12/16 due to pain, has happened 1 to 2 times per week since onset I recommended cT scan based on report, states she will wait until 12/29 to see if goes away. I advised ED if she developed  severe abdominal pain persisting in th interim States she understands

## 2022-01-12 NOTE — Telephone Encounter (Signed)
-----   Message from Quentin Angst, [place] sent at 01/11/2022 10:39 AM EST ----- Patient aware and states she is still hurting and now has nausea.

## 2022-01-22 ENCOUNTER — Encounter: Payer: Self-pay | Admitting: Family Medicine

## 2022-01-22 ENCOUNTER — Ambulatory Visit (INDEPENDENT_AMBULATORY_CARE_PROVIDER_SITE_OTHER): Payer: Medicare Other | Admitting: Family Medicine

## 2022-01-22 VITALS — BP 112/70 | HR 93 | Ht 68.0 in | Wt 283.1 lb

## 2022-01-22 DIAGNOSIS — E785 Hyperlipidemia, unspecified: Secondary | ICD-10-CM | POA: Diagnosis not present

## 2022-01-22 DIAGNOSIS — I10 Essential (primary) hypertension: Secondary | ICD-10-CM

## 2022-01-22 DIAGNOSIS — E1169 Type 2 diabetes mellitus with other specified complication: Secondary | ICD-10-CM | POA: Diagnosis not present

## 2022-01-22 DIAGNOSIS — Z0001 Encounter for general adult medical examination with abnormal findings: Secondary | ICD-10-CM | POA: Diagnosis not present

## 2022-01-22 DIAGNOSIS — E039 Hypothyroidism, unspecified: Secondary | ICD-10-CM

## 2022-01-22 DIAGNOSIS — E559 Vitamin D deficiency, unspecified: Secondary | ICD-10-CM

## 2022-01-22 NOTE — Progress Notes (Signed)
    [name]     MRN: [id number]      DOB: 1955/03/09  HPI: Here for annual exam   she is still skeptical about covid and RSV vaccines     PE: BP 112/70 (BP Location: Right Arm, Patient Position: Sitting, Cuff Size: Large)   Pulse 93   Ht '5\' 8"'$  (1.727 m)   Wt 283 lb 1.3 oz (128.4 kg)   SpO2 95%   BMI 43.04 kg/m   Pleasant  female, alert and oriented x 3, in no cardio-pulmonary distress. Afebrile. HEENT No facial trauma or asymetry. Sinuses non tender.  Extra occullar muscles intact.. External ears normal, . Neck: decreased ROM and tender to palpation,  no adenopathy,JVD or thyromegaly.No bruits.  Chest: Clear to ascultation bilaterally.No crackles or wheezes. Non tender to palpation  Cardiovascular system; Heart sounds normal,  S1 and  S2 ,no S3.  No murmur, or thrill.  Peripheral pulses normal.  Abdomen: Soft, mild RLQ tenderness, no guarding or rebounds. No bruits. Bowel sounds normal. .      Musculoskeletal exam: Decreased  ROM of spine, normal in  , shoulders and knees. No deformity ,swelling or crepitus noted. No muscle wasting or atrophy.   Neurologic: Cranial nerves 2 to 12 intact. Power,  ,sensation  normal throughout. No disturbance in gait. No tremor.  Skin: Intact, no ulceration, erythema , scaling or rash noted. Pigmentation normal throughout  Psych; Normal mood and affect. Judgement and concentration normal, stressed due to illness in her son   Assessment & Plan:  Encounter for Medicare annual examination with abnormal findings Annual exam as documented. Counseling done  re healthy lifestyle involving commitment to 150 minutes exercise per week, heart healthy diet, and attaining healthy weight.The importance of adequate sleep also discussed. Regular seat belt use and home safety, is also discussed. Changes in health habits are decided on by the patient with goals and time frames  set for achieving them. Immunization and cancer  screening needs are specifically addressed at this visit.

## 2022-01-22 NOTE — Patient Instructions (Addendum)
F/u in 3.5 months, call if you need me sooner   Labs today, CBC, lipid, cmp and eGFR, HBA1C, TSH. Vit D today  Please reconsider vaccines recommended Covid and RSV  Please reduce intake of fried and fatty foods , sugar and starches , drink water only  Wishing you relief from pain   Thanks for choosing  [facility], we consider it a privelige to serve you.

## 2022-01-22 NOTE — Assessment & Plan Note (Signed)

## 2022-01-23 LAB — CMP14+EGFR
ALT: 18 IU/L (ref 0–32)
AST: 21 IU/L (ref 0–40)
Albumin/Globulin Ratio: 1.9 (ref 1.2–2.2)
Albumin: 4.3 g/dL (ref 3.9–4.9)
Alkaline Phosphatase: 85 IU/L (ref 44–121)
BUN/Creatinine Ratio: 15 (ref 12–28)
BUN: 13 mg/dL (ref 8–27)
Bilirubin Total: 0.4 mg/dL (ref 0.0–1.2)
CO2: 24 mmol/L (ref 20–29)
Calcium: 9.9 mg/dL (ref 8.7–10.3)
Chloride: 101 mmol/L (ref 96–106)
Creatinine, Ser: 0.84 mg/dL (ref 0.57–1.00)
Globulin, Total: 2.3 g/dL (ref 1.5–4.5)
Glucose: 145 mg/dL — ABNORMAL HIGH (ref 70–99)
Potassium: 5 mmol/L (ref 3.5–5.2)
Sodium: 140 mmol/L (ref 134–144)
Total Protein: 6.6 g/dL (ref 6.0–8.5)
eGFR: 77 mL/min/{1.73_m2} (ref >59)

## 2022-01-23 LAB — CBC
Hematocrit: 41.6 % (ref 34.0–46.6)
Hemoglobin: 13.7 g/dL (ref 11.1–15.9)
MCH: 29.9 pg (ref 26.6–33.0)
MCHC: 32.9 g/dL (ref 31.5–35.7)
MCV: 91 fL (ref 79–97)
Platelets: 334 10*3/uL (ref 150–450)
RBC: 4.58 x10E6/uL (ref 3.77–5.28)
RDW: 13.2 % (ref 11.7–15.4)
WBC: 9.5 10*3/uL (ref 3.4–10.8)

## 2022-01-23 LAB — HEMOGLOBIN A1C
Est. average glucose Bld gHb Est-mCnc: 166 mg/dL
Hgb A1c MFr Bld: 7.4 % — ABNORMAL HIGH (ref 4.8–5.6)

## 2022-01-23 LAB — TSH: TSH: 0.296 u[IU]/mL — ABNORMAL LOW (ref 0.450–4.500)

## 2022-01-23 LAB — LIPID PANEL
Chol/HDL Ratio: 2.7 ratio (ref 0.0–4.4)
Cholesterol, Total: 143 mg/dL (ref 100–199)
HDL: 53 mg/dL (ref >39)
LDL Chol Calc (NIH): 66 mg/dL (ref 0–99)
Triglycerides: 137 mg/dL (ref 0–149)
VLDL Cholesterol Cal: 24 mg/dL (ref 5–40)

## 2022-01-23 LAB — VITAMIN D 25 HYDROXY (VIT D DEFICIENCY, FRACTURES): Vit D, 25-Hydroxy: 51.6 ng/mL (ref 30.0–100.0)

## 2022-01-26 MED ORDER — EMPAGLIFLOZIN 10 MG PO TABS: 10.0000 mg | ORAL_TABLET | Freq: Every day | ORAL | 2 refills | Status: DC

## 2022-01-26 NOTE — Addendum Note (Signed)
Addended by: Fayrene Helper on: 01/26/2022 12:42 PM   Modules accepted: Orders

## 2022-01-28 ENCOUNTER — Other Ambulatory Visit: Payer: Self-pay | Admitting: Anesthesiology

## 2022-01-28 DIAGNOSIS — M545 Low back pain, unspecified: Secondary | ICD-10-CM

## 2022-02-10 ENCOUNTER — Telehealth: Payer: Self-pay | Admitting: Family Medicine

## 2022-02-10 ENCOUNTER — Encounter: Payer: Self-pay | Admitting: Internal Medicine

## 2022-02-10 ENCOUNTER — Ambulatory Visit (INDEPENDENT_AMBULATORY_CARE_PROVIDER_SITE_OTHER): Payer: Medicare Other | Admitting: Internal Medicine

## 2022-02-10 VITALS — BP 125/79 | HR 104 | Resp 16 | Ht 68.0 in | Wt 282.1 lb

## 2022-02-10 DIAGNOSIS — Z0000 Encounter for general adult medical examination without abnormal findings: Secondary | ICD-10-CM | POA: Diagnosis not present

## 2022-02-10 NOTE — Progress Notes (Signed)
Subjective:   [name] is a 67 y.o. female who presents for Medicare Annual (Subsequent) preventive examination.  Review of Systems   Review of Systems  All other systems reviewed and are negative.   Objective:    Today's Vitals   02/10/22 1442  BP: 125/79  Pulse: (!) 104  Resp: 16  SpO2: 100%  Weight: 282 lb 1.9 oz (128 kg)  Height: '5\' 8"'$  (1.727 m)   Body mass index is 42.9 kg/m.     02/10/2022    2:40 PM 05/19/2021   12:50 PM 04/18/2021    5:00 PM 03/27/2021    1:02 PM 02/09/2021   11:43 AM 12/24/2019    8:13 AM 08/16/2019   10:50 AM  Advanced Directives  Does Patient Have a Medical Advance Directive? No Yes No No No No No  Type of Advance Directive  [place] in Chart?  No - copy requested       Would patient like information on creating a medical advance directive? Yes (ED - Information included in AVS) No - Patient declined  No - Patient declined Yes (ED - Information included in AVS) No - Patient declined Yes (MAU/Ambulatory/Procedural Areas - Information given)    Current Medications (verified) Outpatient Encounter Medications as of 02/10/2022  Medication Sig   aspirin 81 MG tablet Take 81 mg by mouth daily.   benazepril (LOTENSIN) 5 MG tablet TAKE ONE TABLET BY MOUTH DAILY   calcium-vitamin D (OSCAL WITH D) 500-200 MG-UNIT per tablet Take 1 tablet by mouth daily.   cyclobenzaprine (FLEXERIL) 10 MG tablet Take 10 mg by mouth 3 (three) times daily as needed.   Dexlansoprazole 30 MG capsule DR TAKE 1 CAPSULE(30 MG) BY MOUTH TWICE DAILY BEFORE A MEAL   empagliflozin (JARDIANCE) 10 MG TABS tablet Take 1 tablet (10 mg total) by mouth daily before breakfast.   fluticasone (FLONASE) 50 MCG/ACT nasal spray USE 2 SPRAYS IN EACH NOSTRIL ONCE DAILY. SHAKE GENTLY BEFORE USING.   furosemide (LASIX) 20 MG tablet TAKE ONE TABLET BY MOUTH ONCE DAILY FOR LEG SWELLING.   gabapentin (NEURONTIN) 300 MG capsule TAKE ONE  CAPSULE BY MOUTH TWICE DAILY   glipiZIDE (GLUCOTROL XL) 10 MG 24 hr tablet TAKE TWO (2) TABLETS BY MOUTH ONCE DAILY EVERY MORNING WITH BREAKFAST   glucose blood (ONETOUCH VERIO) test strip USE TO TEST BLOOD SUGAR EVERY DAY   Lancets (ONETOUCH DELICA PLUS [id number]) MISC USE TO TEST BLOOD SUGAR ONCE DAILY   levothyroxine (SYNTHROID) 150 MCG tablet TAKE ONE TABLET BY MOUTH DAILY.   metFORMIN (GLUCOPHAGE) 1000 MG tablet TAKE ONE TABLET BY MOUTH TWICE DAILY WITH A MEAL   montelukast (SINGULAIR) 10 MG tablet TAKE ONE TABLET BY MOUTH AT BEDTIME   ondansetron (ZOFRAN) 4 MG tablet Take 1 tablet (4 mg total) by mouth every 6 (six) hours as needed for nausea or vomiting.   oxyCODONE-acetaminophen (PERCOCET) 10-325 MG tablet Take 1 tablet by mouth 4 (four) times daily as needed for pain.   potassium chloride SA (KLOR-CON M) 20 MEQ tablet TAKE ONE TABLET BY MOUTH EVERY DAY AS NEEDED ON DAYS YOU TAKE FUROSEMIDE FOR SWELLING   rosuvastatin (CRESTOR) 40 MG tablet Take 1 tablet (40 mg total) by mouth daily.   traZODone (DESYREL) 100 MG tablet TAKE ONE TABLET BY MOUTH AT BEDTIME   Turmeric 500 MG CAPS Take 1 capsule by mouth daily.   venlafaxine XR (  EFFEXOR XR) 37.5 MG 24 hr capsule Take 1 capsule (37.5 mg total) by mouth daily with breakfast.   No facility-administered encounter medications on file as of 02/10/2022.    Allergies (verified) Ozempic (0.25 or 0.5 mg-dose) [semaglutide(0.25 or 0.'5mg'$ -dos)], Naproxen sodium, Tsh [thyrotropin], and Statins   History: Past Medical History:  Diagnosis Date   Allergy    Anemia    Arthritis    Cataract    forming   COVID-19    Diabetes mellitus without complication ([place]) 8250   GERD (gastroesophageal reflux disease)    History of colon polyps    Hyperlipidemia    Hypertension 2011   Morbid obesity ([employer])    Neuromuscular disorder ([place])    issues with nerves in back    Osteopenia    Thyroid disease    Past Surgical History:  Procedure Laterality Date    ABDOMINAL HYSTERECTOMY     APPENDECTOMY     CHOLECYSTECTOMY     COLONOSCOPY  2009   [facility] - normal per pt    TONSILLECTOMY     Family History  Problem Relation Age of Onset   Asthma Mother    Hypertension Mother    Heart disease Mother 41       cabg   Pancreatic cancer Mother    Diabetes Father    Hypertension Father    Heart disease Father 43       cabg   Cancer Sister        breast, uterine   Cancer Sister    Cancer Maternal Grandmother    Prostate cancer Paternal Grandmother    Cancer Paternal Grandfather    Colon polyps Neg Hx    Colon cancer Neg Hx    Esophageal cancer Neg Hx    Rectal cancer Neg Hx    Stomach cancer Neg Hx    Social History   Socioeconomic History   Marital status: Married    Spouse name: Emmanuel Ercole   Number of children: 2   Years of education: 12   Highest education level: 12th grade  Occupational History   Occupation: disabled   Tobacco Use   Smoking status: Never    Passive exposure: Never   Smokeless tobacco: Never  Vaping Use   Vaping Use: Never used  Substance and Sexual Activity   Alcohol use: No   Drug use: No   Sexual activity: Not Currently  Other Topics Concern   Not on file  Social History Narrative   Lives with Husband alone    Social Determinants of Health   Financial Resource Strain: Low Risk  (03/27/2021)   Overall Financial Resource Strain (CARDIA)    Difficulty of Paying Living Expenses: Not hard at all  Food Insecurity: No Food Insecurity (05/19/2021)   Hunger Vital Sign    Worried About Running Out of Food in the Last Year: Never true    Ran Out of Food in the Last Year: Never true  Transportation Needs: No Transportation Needs (05/19/2021)   PRAPARE - Hydrologist (Medical): No    Lack of Transportation (Non-Medical): No  Physical Activity: Insufficiently Active (03/27/2021)   Exercise Vital Sign    Days of Exercise per Week: 4 days    Minutes of Exercise per Session: 30  min  Stress: Stress Concern Present (03/27/2021)   [place]    Feeling of Stress : Rather much  Social Connections: Moderately Integrated (03/27/2021)  Social Licensed conveyancer [NHANES]    Frequency of Communication with Friends and Family: More than three times a week    Frequency of Social Gatherings with Friends and Family: More than three times a week    Attends Religious Services: More than 4 times per year    Active Member of [employer] or Organizations: No    Attends Archivist Meetings: Never    Marital Status: Married  Recent Concern: Social Connections - Moderately Isolated (02/09/2021)   Social Connection and Isolation Panel [NHANES]    Frequency of Communication with Friends and Family: More than three times a week    Frequency of Social Gatherings with Friends and Family: More than three times a week    Attends Religious Services: More than 4 times per year    Active Member of [employer] or Organizations: No    Attends Archivist Meetings: Never    Marital Status: Separated    Tobacco Counseling Counseling given: Not Answered   Clinical Intake:  Pre-visit preparation completed: Yes  Pain : No/denies pain (Has chronic pain, controlled most days on medications. She follows with pain management.)     Nutritional Status: BMI > 30  Obese Diabetes: Yes CBG done?: No Did pt. bring in CBG monitor from home?: No  How often do you need to have someone help you when you read instructions, pamphlets, or other written materials from your doctor or pharmacy?: 1 - Never What is the last grade level you completed in school?: Few years of college   Activities of Daily Living    02/10/2022    2:43 PM 03/27/2021    1:01 PM  In your present state of health, do you have any difficulty performing the following activities:  Hearing? 0 0  Vision? 0 0  Difficulty concentrating or making  decisions? 0 0  Walking or climbing stairs? 0 0  Dressing or bathing? 0 0  Doing errands, shopping? 0 0  Preparing Food and eating ?  N  Using the Toilet?  N  In the past six months, have you accidently leaked urine?  N  Do you have problems with loss of bowel control?  N  Managing your Medications?  N  Managing your Finances?  N  Housekeeping or managing your Housekeeping?  N    Patient Care Team: Fayrene Helper, MD as PCP - General Claiborne Billings Joyice Faster, MD as PCP - Cardiology (Cardiology) Beryle Lathe, [facility] (Inactive) (Pharmacist)  Indicate any recent Medical Services you may have received from other than [facility] providers in the past year (date may be approximate).     Assessment:   This is a routine wellness examination for Jamisha.  Hearing/Vision screen No results found.  Dietary issues and exercise activities discussed:     Goals Addressed   None    Depression Screen    02/10/2022    2:42 PM 01/22/2022    8:05 AM 10/27/2021    8:33 AM 06/23/2021    1:56 PM 05/19/2021    1:09 PM 05/19/2021   12:47 PM 05/13/2021    8:08 AM  PHQ 2/9 Scores  PHQ - 2 Score '2 2 1 1 2 2 '$ 0  PHQ- 9 Score  9         Fall Risk    02/10/2022    2:42 PM 01/22/2022    8:04 AM 10/27/2021    8:33 AM 06/23/2021    1:26 PM 05/19/2021   12:49 PM  Fall Risk   Falls in the past year? 0 0 0 1 1  Number falls in past yr: 0 0 0 0 0  Injury with Fall? 0 0 0 0 0  Risk for fall due to :  No Fall Risks No Fall Risks    Follow up  Falls evaluation completed Falls evaluation completed      FALL RISK PREVENTION PERTAINING TO THE HOME:  Any stairs in or around the home? Yes  If so, are there any without handrails? Yes  Home free of loose throw rugs in walkways, pet beds, electrical cords, etc? Yes  Adequate lighting in your home to reduce risk of falls? Yes   ASSISTIVE DEVICES UTILIZED TO PREVENT FALLS:  Life alert? No  Use of a cane, walker or w/c? Yes walker when back is very bad Grab  bars in the bathroom? Yes  Shower chair or bench in shower? No  Elevated toilet seat or a handicapped toilet? Yes    Cognitive Function:        02/10/2022    2:45 PM 02/09/2021   11:37 AM 12/24/2019    8:15 AM 12/18/2018    9:09 AM 01/05/2018    9:07 AM  6CIT Screen  What Year? 0 points 0 points 0 points 0 points 0 points  What month? 0 points 0 points 0 points 0 points 0 points  What time? 0 points 0 points 0 points 0 points 0 points  Count back from 20 0 points 0 points 0 points 0 points 0 points  Months in reverse 0 points 0 points 0 points 0 points 0 points  Repeat phrase 0 points 0 points 0 points 0 points 6 points  Total Score 0 points 0 points 0 points 0 points 6 points    Immunizations Immunization History  Administered Date(s) Administered   Fluad Quad(high Dose 65+) 11/18/2020, 10/27/2021   Influenza Whole 12/03/2008, 10/19/2010   Influenza,inj,Quad PF,6+ Mos 12/27/2012, 11/01/2013, 10/25/2014, 11/10/2015, 11/24/2016, 12/08/2017, 12/13/2018, 09/25/2019   PNEUMOCOCCAL CONJUGATE-20 11/18/2020   Pneumococcal Conjugate-13 04/11/2014   Pneumococcal Polysaccharide-23 03/05/2013   Td 12/03/2008   Tdap 12/13/2018   Zoster Recombinat (Shingrix) 07/21/2017, 01/05/2018    TDAP status: Up to date  Flu Vaccine status: Up to date  Pneumococcal vaccine status: Up to date  Covid-19 vaccine status: Completed vaccines  Qualifies for Shingles Vaccine? Yes   Zostavax completed No   Shingrix Completed?: Yes  Screening Tests Health Maintenance  Topic Date Due   Medicare Annual Wellness (AWV)  02/09/2022   HEMOGLOBIN A1C  07/24/2022   Diabetic kidney evaluation - Urine ACR  10/22/2022   FOOT EXAM  10/31/2022   OPHTHALMOLOGY EXAM  11/11/2022   COLONOSCOPY (Pts 45-44yr Insurance coverage will need to be confirmed)  12/20/2022   Diabetic kidney evaluation - eGFR measurement  01/23/2023   MAMMOGRAM  07/18/2023   DTaP/Tdap/Td (3 - Td or Tdap) 12/12/2028   Pneumonia Vaccine  67 Years old  Completed   INFLUENZA VACCINE  Completed   DEXA SCAN  Completed   Hepatitis C Screening  Completed   Zoster Vaccines- Shingrix  Completed   HPV VACCINES  Aged Out   COVID-19 Vaccine  Discontinued    Health Maintenance  Health Maintenance Due  Topic Date Due   Medicare Annual Wellness (AWV)  02/09/2022    Colorectal cancer screening: Type of screening: Colonoscopy. Completed 12/19/2017. Repeat every 5 years  Mammogram status: Completed 07/17/2021. Repeat every year  Bone Density status: Completed 12/11/2020.  Results reflect: Bone density results: OSTEOPENIA. Repeat every 2 years.  Lung Cancer Screening: (Low Dose CT Chest recommended if Age 74-80 years, 30 pack-year currently smoking OR have quit w/in 15years.) does not qualify.   Additional Screening:  Hepatitis C Screening: does not qualify; Completed 01/24/2015  Vision Screening: Recommended annual ophthalmology exams for early detection of glaucoma and other disorders of the eye. Is the patient up to date with their annual eye exam?  Yes  Who is the provider or what is the name of the office in which the patient attends annual eye exams? [facility] If pt is not established with a provider, would they like to be referred to a provider to establish care? No .   Dental Screening: Recommended annual dental exams for proper oral hygiene  Community Resource Referral / Chronic Care Management: CRR required this visit?  No   CCM required this visit?  No      Plan:     I have personally reviewed and noted the following in the patient's chart:   Medical and social history Use of alcohol, tobacco or illicit drugs  Current medications and supplements including opioid prescriptions. Patient is currently taking opioid prescriptions. Information provided to patient regarding non-opioid alternatives. Patient advised to discuss non-opioid treatment plan with their provider. Functional ability and  status Nutritional status Physical activity Advanced directives List of other physicians Hospitalizations, surgeries, and ER visits in previous 12 months Vitals Screenings to include cognitive, depression, and falls Referrals and appointments  In addition, I have reviewed and discussed with patient certain preventive protocols, quality metrics, and best practice recommendations. A written personalized care plan for preventive services as well as general preventive health recommendations were provided to patient.     Lorene Dy, MD   02/10/2022

## 2022-02-10 NOTE — Patient Instructions (Signed)
  [name] , Thank you for taking time to come for your Medicare Wellness Visit. I appreciate your ongoing commitment to your health goals. Please review the following plan we discussed and let me know if I can assist you in the future.   These are the goals we discussed: Patient is following with pain specialist. She would like to increase activity level as they work on managing her pain. She is scheduled for MRI of spine.     This is a list of the screening recommended for you and due dates:  Health Maintenance  Topic Date Due   Medicare Annual Wellness Visit  02/09/2022   Hemoglobin A1C  07/24/2022   Yearly kidney health urinalysis for diabetes  10/22/2022   Complete foot exam   10/31/2022   Eye exam for diabetics  11/11/2022   Colon Cancer Screening  12/20/2022   Yearly kidney function blood test for diabetes  01/23/2023   Mammogram  07/18/2023   DTaP/Tdap/Td vaccine (3 - Td or Tdap) 12/12/2028   Pneumonia Vaccine  Completed   Flu Shot  Completed   DEXA scan (bone density measurement)  Completed   Hepatitis C Screening: USPSTF Recommendation to screen - Ages 92-79 yo.  Completed   Zoster (Shingles) Vaccine  Completed   HPV Vaccine  Aged Out   COVID-19 Vaccine  Discontinued

## 2022-02-10 NOTE — Telephone Encounter (Signed)
Pt was in office for her wellness visit and wanted to see if she could have a refill of Diflucan because she has a yeast infection from the jardiance

## 2022-02-13 ENCOUNTER — Ambulatory Visit
Admission: RE | Admit: 2022-02-13 | Discharge: 2022-02-13 | Disposition: A | Discharge status: Home / Self Care | Payer: Medicare Other | Source: Ambulatory Visit | Attending: Anesthesiology | Admitting: Anesthesiology

## 2022-02-13 DIAGNOSIS — M545 Low back pain, unspecified: Secondary | ICD-10-CM

## 2022-02-13 DIAGNOSIS — M48061 Spinal stenosis, lumbar region without neurogenic claudication: Secondary | ICD-10-CM | POA: Diagnosis not present

## 2022-02-13 DIAGNOSIS — M47816 Spondylosis without myelopathy or radiculopathy, lumbar region: Secondary | ICD-10-CM | POA: Diagnosis not present

## 2022-02-13 DIAGNOSIS — M4316 Spondylolisthesis, lumbar region: Secondary | ICD-10-CM | POA: Diagnosis not present

## 2022-02-16 DIAGNOSIS — G894 Chronic pain syndrome: Secondary | ICD-10-CM | POA: Diagnosis not present

## 2022-02-16 DIAGNOSIS — E1142 Type 2 diabetes mellitus with diabetic polyneuropathy: Secondary | ICD-10-CM | POA: Diagnosis not present

## 2022-02-16 DIAGNOSIS — M47812 Spondylosis without myelopathy or radiculopathy, cervical region: Secondary | ICD-10-CM | POA: Diagnosis not present

## 2022-02-16 DIAGNOSIS — M150 Primary generalized (osteo)arthritis: Secondary | ICD-10-CM | POA: Diagnosis not present

## 2022-02-16 DIAGNOSIS — M5416 Radiculopathy, lumbar region: Secondary | ICD-10-CM | POA: Diagnosis not present

## 2022-02-16 DIAGNOSIS — M47816 Spondylosis without myelopathy or radiculopathy, lumbar region: Secondary | ICD-10-CM | POA: Diagnosis not present

## 2022-02-16 DIAGNOSIS — G5711 Meralgia paresthetica, right lower limb: Secondary | ICD-10-CM | POA: Diagnosis not present

## 2022-02-16 DIAGNOSIS — M62838 Other muscle spasm: Secondary | ICD-10-CM | POA: Diagnosis not present

## 2022-02-24 DIAGNOSIS — K080 Exfoliation of teeth due to systemic causes: Secondary | ICD-10-CM | POA: Diagnosis not present

## 2022-04-02 DIAGNOSIS — K080 Exfoliation of teeth due to systemic causes: Secondary | ICD-10-CM | POA: Diagnosis not present

## 2022-04-05 ENCOUNTER — Other Ambulatory Visit: Payer: Self-pay | Admitting: Family Medicine

## 2022-04-07 ENCOUNTER — Other Ambulatory Visit: Payer: Self-pay

## 2022-04-07 MED ORDER — ONETOUCH VERIO VI STRP: ORAL_STRIP | 5 refills | Status: DC

## 2022-04-13 DIAGNOSIS — G894 Chronic pain syndrome: Secondary | ICD-10-CM | POA: Diagnosis not present

## 2022-04-13 DIAGNOSIS — G5711 Meralgia paresthetica, right lower limb: Secondary | ICD-10-CM | POA: Diagnosis not present

## 2022-04-13 DIAGNOSIS — M5416 Radiculopathy, lumbar region: Secondary | ICD-10-CM | POA: Diagnosis not present

## 2022-04-13 DIAGNOSIS — M47812 Spondylosis without myelopathy or radiculopathy, cervical region: Secondary | ICD-10-CM | POA: Diagnosis not present

## 2022-04-13 DIAGNOSIS — M150 Primary generalized (osteo)arthritis: Secondary | ICD-10-CM | POA: Diagnosis not present

## 2022-04-13 DIAGNOSIS — M62838 Other muscle spasm: Secondary | ICD-10-CM | POA: Diagnosis not present

## 2022-04-13 DIAGNOSIS — E1142 Type 2 diabetes mellitus with diabetic polyneuropathy: Secondary | ICD-10-CM | POA: Diagnosis not present

## 2022-04-13 DIAGNOSIS — M47816 Spondylosis without myelopathy or radiculopathy, lumbar region: Secondary | ICD-10-CM | POA: Diagnosis not present

## 2022-04-16 ENCOUNTER — Ambulatory Visit: Payer: Medicare Other | Admitting: Cardiovascular Disease

## 2022-05-12 ENCOUNTER — Encounter: Payer: Self-pay | Admitting: Family Medicine

## 2022-05-12 ENCOUNTER — Ambulatory Visit (INDEPENDENT_AMBULATORY_CARE_PROVIDER_SITE_OTHER): Payer: Medicare Other | Admitting: Family Medicine

## 2022-05-12 VITALS — BP 123/77 | HR 91 | Resp 16 | Ht 68.0 in | Wt 283.0 lb

## 2022-05-12 DIAGNOSIS — E039 Hypothyroidism, unspecified: Secondary | ICD-10-CM

## 2022-05-12 DIAGNOSIS — F418 Other specified anxiety disorders: Secondary | ICD-10-CM

## 2022-05-12 DIAGNOSIS — E785 Hyperlipidemia, unspecified: Secondary | ICD-10-CM

## 2022-05-12 DIAGNOSIS — R8290 Unspecified abnormal findings in urine: Secondary | ICD-10-CM | POA: Insufficient documentation

## 2022-05-12 DIAGNOSIS — E1159 Type 2 diabetes mellitus with other circulatory complications: Secondary | ICD-10-CM

## 2022-05-12 DIAGNOSIS — I10 Essential (primary) hypertension: Secondary | ICD-10-CM

## 2022-05-12 DIAGNOSIS — E1169 Type 2 diabetes mellitus with other specified complication: Secondary | ICD-10-CM

## 2022-05-12 DIAGNOSIS — Z1231 Encounter for screening mammogram for malignant neoplasm of breast: Secondary | ICD-10-CM

## 2022-05-12 DIAGNOSIS — G894 Chronic pain syndrome: Secondary | ICD-10-CM

## 2022-05-12 NOTE — Assessment & Plan Note (Signed)
Controlled , managed by pain clinic

## 2022-05-12 NOTE — Assessment & Plan Note (Signed)
Controlled, no change in medication DASH diet and commitment to daily physical activity for a minimum of 30 minutes discussed and encouraged, as a part of hypertension management. The importance of attaining a healthy weight is also discussed.     05/12/2022    8:09 AM 02/10/2022    2:42 PM 01/22/2022    8:02 AM 11/25/2021   11:26 AM 11/25/2021    8:16 AM 10/27/2021    8:32 AM 09/07/2021    2:04 PM  BP/Weight  Systolic BP 123 125 112 120 126 110 128  Diastolic BP 77 79 70 82 80 74 72  Wt. (Lbs) 283 282.12 283.08  278.2 278.04 278  BMI 43.03 kg/m2 42.9 kg/m2 43.04 kg/m2  42.3 kg/m2 42.28 kg/m2 42.27 kg/m2

## 2022-05-12 NOTE — Assessment & Plan Note (Signed)
  Patient re-educated about  the importance of commitment to a  minimum of 150 minutes of exercise per week as able.  The importance of healthy food choices with portion control discussed, as well as eating regularly and within a 12 hour window most days. The need to choose "clean , green" food 50 to 75% of the time is discussed, as well as to make water the primary drink and set a goal of 64 ounces water daily.       05/12/2022    8:09 AM 02/10/2022    2:42 PM 01/22/2022    8:02 AM  Weight /BMI  Weight 283 lb 282 lb 1.9 oz 283 lb 1.3 oz  Height  (1.727 m)  (1.727 m)  (1.727 m)  BMI 43.03 kg/m2 42.9 kg/m2 43.04 kg/m2    Unchanged

## 2022-05-12 NOTE — Assessment & Plan Note (Signed)
Increased but no interest in therapy and medication not indicated currently, situational due to stress of current job

## 2022-05-12 NOTE — Assessment & Plan Note (Signed)
Hyperlipidemia:Low fat diet discussed and encouraged.   Lipid Panel  Lab Results  Component Value Date   CHOL 143 01/22/2022   HDL 53 01/22/2022   LDLCALC 66 01/22/2022   TRIG 137 01/22/2022   CHOLHDL 2.7 01/22/2022     Controlled, no change in medication

## 2022-05-12 NOTE — Patient Instructions (Addendum)
  Follow-up in 4 months, call if you need me sooner.  Labs today  HbA1c Chem-7 and EGFR TSH.CCUA and reflex c/s if abn  Lab order to be mailed for the future to be obtained 3 to 5 days before next visit fasting lipid panel CMP and EGFR HbA1c.    Lowr abdominal pain likely related to constipation, discuss with GI at upcoming visit  Colonoscopy due 12/2022 per GI  Please schedule mammogram at checkout.  It is important that you exercise regularly at least 30 minutes 5 times a week. If you develop chest pain, have severe difficulty breathing, or feel very tired, stop exercising immediately and seek medical attention  Think about what you will eat, plan ahead. Choose " clean, green, fresh or frozen" over canned, processed or packaged foods which are more sugary, salty and fatty. 70 to 75% of food eaten should be vegetables and fruit. Three meals at set times with snacks allowed between meals, but they must be fruit or vegetables. Aim to eat over a 12 hour period , example 7 am to 7 pm, and STOP after  your last meal of the day. Drink water,generally about 64 ounces per day, no other drink is as healthy. Fruit juice is best enjoyed in a healthy way, by EATING the fruit. Thanks for choosing [facility], we consider it a privelige to serve you.

## 2022-05-12 NOTE — Progress Notes (Signed)
[name]     MRN: [id number]      DOB: [birth year]   HPI [name] is here for follow up and re-evaluation of chronic medical conditions, medication management and review of any available recent lab and radiology data.  Preventive health is updated, specifically  Cancer screening and Immunization.   Questions or concerns regarding consultations or procedures which the PT has had in the interim are  addressed. The PT denies any adverse reactions to current medications since the last visit.  Intermittent lower abdominal discomfort and malodorous urine x 1 week, has chronic constipation.   ROS Denies recent fever or chills. Denies sinus pressure, nasal congestion, ear pain or sore throat. Denies chest congestion, productive cough or wheezing. Denies chest pains, palpitations and leg swelling Denies abdominal pain, nausea, vomiting,diarrhea or constipation.   Denies dysuria, frequency, hesitancy or incontinence. Chronic back pain  and limitation in mobility. Denies headaches, seizures, numbness, or tingling. 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  HEENT: No facial asymmetry, EOMI,     Neck supple .  Chest: Clear to auscultation bilaterally.  CVS: S1, S2 no murmurs, no S3.Regular rate.     Ext: No edema  MS: decreased  ROM spine, shoulders, hips and knees.  Skin: Intact, no ulcerations or rash noted.  Psych: Good eye contact, normal affect. Memory intact not anxious or depressed appearing.  CNS: CN 2-12 intact, power,  normal throughout.no focal deficits noted.   Assessment & Plan  Hypertension goal BP (blood pressure) < 130/80 Controlled, no change in medication DASH diet and commitment to daily physical activity for  a minimum of 30 minutes discussed and encouraged, as a part of hypertension management. The importance of attaining a healthy weight is also discussed.     05/12/2022    8:09 AM 02/10/2022    2:42 PM 01/22/2022    8:02 AM 11/25/2021   11:26 AM 11/25/2021    8:16 AM 10/27/2021    8:32 AM 09/07/2021    2:04 PM  BP/Weight  Systolic BP 123 125 112 120 126 110 128  Diastolic BP 77 79 70 82 80 74 72  Wt. (Lbs) 283 282.12 283.08  278.2 278.04 278  BMI 43.03 kg/m2 42.9 kg/m2 43.04 kg/m2  42.3 kg/m2 42.28 kg/m2 42.27 kg/m2       Type 2 diabetes mellitus with other specified complication (HCC) Ms. [name] is reminded of the importance of commitment to daily physical activity for 30 minutes or more, as able and the need to limit carbohydrate intake to 30 to 60 grams per meal to help with blood sugar control.   The need to take medication as prescribed, test blood sugar as directed, and to call between visits if there is a concern that blood sugar is uncontrolled is also discussed.   Ms. [name] is reminded of the importance of daily foot exam, annual eye examination, and good blood sugar, blood pressure and cholesterol control. Updated lab needed at/ before next visit.      Latest Ref Rng & Units 01/22/2022    8:51 AM 10/21/2021    8:14 AM 06/18/2021    8:20  AM 04/18/2021    5:07 PM 03/13/2021    8:03 AM  Diabetic Labs  HbA1c 4.8 - 5.6 % 7.4  7.2  6.8   7.8   Micro/Creat Ratio 0 - 29 mg/g creat  <3      Chol 100 - 199 mg/dL 161   096     HDL >04 mg/dL 53   45     Calc LDL 0 - 99 mg/dL 66   52     Triglycerides 0 - 149 mg/dL 540   981     Creatinine 0.57 - 1.00 mg/dL 1.91  4.78  2.95  6.21  0.92       05/12/2022    8:09 AM 02/10/2022    2:42 PM 01/22/2022    8:02 AM 11/25/2021   11:26 AM 11/25/2021    8:16 AM 10/27/2021    8:32 AM 09/07/2021    2:04 PM  BP/Weight  Systolic BP 123 125 112 120 126 110 128  Diastolic BP 77 79 70 82 80 74 72  Wt. (Lbs) 283 282.12 283.08  278.2 278.04 278   BMI 43.03 kg/m2 42.9 kg/m2 43.04 kg/m2  42.3 kg/m2 42.28 kg/m2 42.27 kg/m2      10/27/2021    8:20 AM 01/09/2021   10:00 AM  Foot/eye exam completion dates  Foot Form Completion Done Done        Hyperlipidemia Hyperlipidemia:Low fat diet discussed and encouraged.   Lipid Panel  Lab Results  Component Value Date   CHOL 143 01/22/2022   HDL 53 01/22/2022   LDLCALC 66 01/22/2022   TRIG 137 01/22/2022   CHOLHDL 2.7 01/22/2022     Controlled, no change in medication   Morbid obesity  Patient re-educated about  the importance of commitment to a  minimum of 150 minutes of exercise per week as able.  The importance of healthy food choices with portion control discussed, as well as eating regularly and within a 12 hour window most days. The need to choose "clean , green" food 50 to 75% of the time is discussed, as well as to make water the primary drink and set a goal of 64 ounces water daily.       05/12/2022    8:09 AM 02/10/2022    2:42 PM 01/22/2022    8:02 AM  Weight /BMI  Weight 283 lb 282 lb 1.9 oz 283 lb 1.3 oz  Height  (1.727 m)  (1.727 m)  (1.727 m)  BMI 43.03 kg/m2 42.9 kg/m2 43.04 kg/m2    Unchanged  Depression with anxiety Increased but no interest in therapy and medication not indicated currently, situational due to stress of current job  Chronic pain Controlled , managed by pain clinic  Malodorous urine 1 week history with lower abdominal discomfort, check CCUA and C/S

## 2022-05-12 NOTE — Assessment & Plan Note (Signed)
1 week history with lower abdominal discomfort, check CCUA and C/S

## 2022-05-12 NOTE — Assessment & Plan Note (Signed)
[name] is reminded of the importance of commitment to daily physical activity for 30 minutes or more, as able and the need to limit carbohydrate intake to 30 to 60 grams per meal to help with blood sugar control.   The need to take medication as prescribed, test blood sugar as directed, and to call between visits if there is a concern that blood sugar is uncontrolled is also discussed.   [name] is reminded of the importance of daily foot exam, annual eye examination, and good blood sugar, blood pressure and cholesterol control. Updated lab needed at/ before next visit.      Latest Ref Rng & Units 01/22/2022    8:51 AM 10/21/2021    8:14 AM 06/18/2021    8:20 AM 04/18/2021    5:07 PM 03/13/2021    8:03 AM  Diabetic Labs  HbA1c 4.8 - 5.6 % 7.4  7.2  6.8   7.8   Micro/Creat Ratio 0 - 29 mg/g creat  <3      Chol 100 - 199 mg/dL 536   644     HDL >03 mg/dL 53   45     Calc LDL 0 - 99 mg/dL 66   52     Triglycerides 0 - 149 mg/dL 474   259     Creatinine 0.57 - 1.00 mg/dL 5.63  8.75  6.43  3.29  0.92       05/12/2022    8:09 AM 02/10/2022    2:42 PM 01/22/2022    8:02 AM 11/25/2021   11:26 AM 11/25/2021    8:16 AM 10/27/2021    8:32 AM 09/07/2021    2:04 PM  BP/Weight  Systolic BP 123 125 112 120 126 110 128  Diastolic BP 77 79 70 82 80 74 72  Wt. (Lbs) 283 282.12 283.08  278.2 278.04 278  BMI 43.03 kg/m2 42.9 kg/m2 43.04 kg/m2  42.3 kg/m2 42.28 kg/m2 42.27 kg/m2      10/27/2021    8:20 AM 01/09/2021   10:00 AM  Foot/eye exam completion dates  Foot Form Completion Done Done

## 2022-05-13 ENCOUNTER — Telehealth: Payer: Self-pay | Admitting: Family Medicine

## 2022-05-13 LAB — UA/M W/RFLX CULTURE, ROUTINE
Bilirubin, UA: NEGATIVE
Glucose, UA: NEGATIVE
Ketones, UA: NEGATIVE
Leukocytes,UA: NEGATIVE
Nitrite, UA: NEGATIVE
Protein,UA: NEGATIVE
RBC, UA: NEGATIVE
Specific Gravity, UA: 1.015 (ref 1.005–1.030)
Urobilinogen, Ur: 0.2 mg/dL (ref 0.2–1.0)
pH, UA: 6 (ref 5.0–7.5)

## 2022-05-13 LAB — BMP8+EGFR
BUN/Creatinine Ratio: 15 (ref 12–28)
BUN: 13 mg/dL (ref 8–27)
CO2: 25 mmol/L (ref 20–29)
Calcium: 10.2 mg/dL (ref 8.7–10.3)
Chloride: 99 mmol/L (ref 96–106)
Creatinine, Ser: 0.89 mg/dL (ref 0.57–1.00)
Glucose: 158 mg/dL — ABNORMAL HIGH (ref 70–99)
Potassium: 4.7 mmol/L (ref 3.5–5.2)
Sodium: 138 mmol/L (ref 134–144)
eGFR: 71 mL/min/{1.73_m2} (ref >59)

## 2022-05-13 LAB — MICROSCOPIC EXAMINATION
Casts: NONE SEEN /lpf
Epithelial Cells (non renal): >10 /hpf — AB (ref 0–10)

## 2022-05-13 LAB — HEMOGLOBIN A1C
Est. average glucose Bld gHb Est-mCnc: 186 mg/dL
Hgb A1c MFr Bld: 8.1 % — ABNORMAL HIGH (ref 4.8–5.6)

## 2022-05-13 LAB — TSH: TSH: 4.49 u[IU]/mL (ref 0.450–4.500)

## 2022-05-13 NOTE — Telephone Encounter (Signed)
Spoke to patient

## 2022-05-13 NOTE — Telephone Encounter (Signed)
Patient returning nurse call about lab results. ° °

## 2022-05-31 ENCOUNTER — Ambulatory Visit: Payer: Medicare Other | Admitting: Cardiovascular Disease

## 2022-06-08 DIAGNOSIS — M5416 Radiculopathy, lumbar region: Secondary | ICD-10-CM | POA: Diagnosis not present

## 2022-06-08 DIAGNOSIS — G894 Chronic pain syndrome: Secondary | ICD-10-CM | POA: Diagnosis not present

## 2022-06-08 DIAGNOSIS — M47812 Spondylosis without myelopathy or radiculopathy, cervical region: Secondary | ICD-10-CM | POA: Diagnosis not present

## 2022-06-08 DIAGNOSIS — M47816 Spondylosis without myelopathy or radiculopathy, lumbar region: Secondary | ICD-10-CM | POA: Diagnosis not present

## 2022-06-08 DIAGNOSIS — G5711 Meralgia paresthetica, right lower limb: Secondary | ICD-10-CM | POA: Diagnosis not present

## 2022-06-08 DIAGNOSIS — E1142 Type 2 diabetes mellitus with diabetic polyneuropathy: Secondary | ICD-10-CM | POA: Diagnosis not present

## 2022-06-08 DIAGNOSIS — Z79891 Long term (current) use of opiate analgesic: Secondary | ICD-10-CM | POA: Diagnosis not present

## 2022-06-08 DIAGNOSIS — M62838 Other muscle spasm: Secondary | ICD-10-CM | POA: Diagnosis not present

## 2022-06-08 DIAGNOSIS — M150 Primary generalized (osteo)arthritis: Secondary | ICD-10-CM | POA: Diagnosis not present

## 2022-06-14 ENCOUNTER — Other Ambulatory Visit: Payer: Self-pay

## 2022-06-14 DIAGNOSIS — E1159 Type 2 diabetes mellitus with other circulatory complications: Secondary | ICD-10-CM

## 2022-06-14 DIAGNOSIS — I10 Essential (primary) hypertension: Secondary | ICD-10-CM

## 2022-06-14 DIAGNOSIS — E785 Hyperlipidemia, unspecified: Secondary | ICD-10-CM

## 2022-07-07 ENCOUNTER — Telehealth: Payer: Self-pay | Admitting: Family Medicine

## 2022-07-07 ENCOUNTER — Other Ambulatory Visit: Payer: Self-pay

## 2022-07-07 DIAGNOSIS — E1165 Type 2 diabetes mellitus with hyperglycemia: Secondary | ICD-10-CM

## 2022-07-07 MED ORDER — ROSUVASTATIN CALCIUM 40 MG PO TABS: 40.0000 mg | ORAL_TABLET | Freq: Every day | ORAL | 3 refills | Status: DC

## 2022-07-07 MED ORDER — LEVOTHYROXINE SODIUM 150 MCG PO TABS: 150.0000 ug | ORAL_TABLET | Freq: Every day | ORAL | 1 refills | Status: DC

## 2022-07-07 MED ORDER — GABAPENTIN 300 MG PO CAPS: 300.0000 mg | ORAL_CAPSULE | Freq: Two times a day (BID) | ORAL | 3 refills | Status: DC

## 2022-07-07 MED ORDER — BENAZEPRIL HCL 5 MG PO TABS: 5.0000 mg | ORAL_TABLET | Freq: Every day | ORAL | 3 refills | Status: DC

## 2022-07-07 MED ORDER — ONETOUCH VERIO VI STRP: ORAL_STRIP | 5 refills | Status: DC

## 2022-07-07 MED ORDER — ONETOUCH DELICA PLUS LANCET33G MISC: 5 refills | Status: DC

## 2022-07-07 MED ORDER — GLIPIZIDE ER 10 MG PO TB24
ORAL_TABLET | ORAL | 3 refills | Status: DC
Start: 2022-07-07 — End: 2023-08-01

## 2022-07-07 MED ORDER — TRAZODONE HCL 100 MG PO TABS: 100.0000 mg | ORAL_TABLET | Freq: Every day | ORAL | 1 refills | Status: DC

## 2022-07-07 MED ORDER — POTASSIUM CHLORIDE CRYS ER 20 MEQ PO TBCR: EXTENDED_RELEASE_TABLET | ORAL | 5 refills | Status: AC

## 2022-07-07 MED ORDER — VENLAFAXINE HCL ER 37.5 MG PO CP24: 37.5000 mg | ORAL_CAPSULE | Freq: Every day | ORAL | 3 refills | Status: DC

## 2022-07-07 MED ORDER — MONTELUKAST SODIUM 10 MG PO TABS: 10.0000 mg | ORAL_TABLET | Freq: Every day | ORAL | 1 refills | Status: DC

## 2022-07-07 MED ORDER — DEXLANSOPRAZOLE 30 MG PO CPDR: DELAYED_RELEASE_CAPSULE | ORAL | 3 refills | Status: DC

## 2022-07-07 MED ORDER — FLUTICASONE PROPIONATE 50 MCG/ACT NA SUSP: NASAL | 3 refills | Status: AC

## 2022-07-07 MED ORDER — FUROSEMIDE 20 MG PO TABS: ORAL_TABLET | ORAL | 5 refills | Status: DC

## 2022-07-07 MED ORDER — METFORMIN HCL 1000 MG PO TABS: 1000.0000 mg | ORAL_TABLET | Freq: Two times a day (BID) | ORAL | 1 refills | Status: DC

## 2022-07-07 NOTE — Telephone Encounter (Signed)
Prescription Request  07/07/2022  LOV: 05/12/2022  What is the name of the medication or equipment? ALL Active MED  Have you contacted your pharmacy to request a refill? Yes   Which pharmacy would you like this sent to?   [facility] : [address], [place], [place] [address]  Phone: [phone]  Patient notified that their request is being sent to the clinical staff for review and that they should receive a response within 2 business days.   Please advise at [facility] [phone]  Wants all refill and future meds sent to [facility]

## 2022-07-07 NOTE — Telephone Encounter (Signed)
All active meds sent to [facility] on [address] per patient request

## 2022-07-20 ENCOUNTER — Other Ambulatory Visit: Payer: Self-pay

## 2022-07-20 ENCOUNTER — Emergency Department (HOSPITAL_COMMUNITY): Payer: Medicare Other

## 2022-07-20 ENCOUNTER — Ambulatory Visit (INDEPENDENT_AMBULATORY_CARE_PROVIDER_SITE_OTHER): Payer: Medicare Other | Admitting: Family Medicine

## 2022-07-20 ENCOUNTER — Encounter: Payer: Self-pay | Admitting: Family Medicine

## 2022-07-20 ENCOUNTER — Encounter (HOSPITAL_COMMUNITY): Payer: Self-pay | Admitting: Emergency Medicine

## 2022-07-20 ENCOUNTER — Ambulatory Visit
Admission: RE | Admit: 2022-07-20 | Discharge: 2022-07-20 | Disposition: A | Discharge status: Home / Self Care | Payer: Medicare Other | Source: Ambulatory Visit | Attending: Family Medicine | Admitting: Family Medicine

## 2022-07-20 ENCOUNTER — Emergency Department (HOSPITAL_COMMUNITY)
Admission: EM | Admit: 2022-07-20 | Discharge: 2022-07-20 | Disposition: A | Discharge status: Home / Self Care | Payer: Medicare Other | Attending: Emergency Medicine | Admitting: Emergency Medicine

## 2022-07-20 VITALS — BP 117/80 | HR 104 | Ht 68.0 in | Wt 288.0 lb

## 2022-07-20 DIAGNOSIS — Z7982 Long term (current) use of aspirin: Secondary | ICD-10-CM | POA: Insufficient documentation

## 2022-07-20 DIAGNOSIS — Z1231 Encounter for screening mammogram for malignant neoplasm of breast: Secondary | ICD-10-CM

## 2022-07-20 DIAGNOSIS — R8290 Unspecified abnormal findings in urine: Secondary | ICD-10-CM

## 2022-07-20 DIAGNOSIS — R1011 Right upper quadrant pain: Secondary | ICD-10-CM | POA: Diagnosis not present

## 2022-07-20 DIAGNOSIS — R112 Nausea with vomiting, unspecified: Secondary | ICD-10-CM | POA: Diagnosis not present

## 2022-07-20 DIAGNOSIS — Z79899 Other long term (current) drug therapy: Secondary | ICD-10-CM | POA: Insufficient documentation

## 2022-07-20 DIAGNOSIS — R109 Unspecified abdominal pain: Secondary | ICD-10-CM

## 2022-07-20 DIAGNOSIS — R110 Nausea: Secondary | ICD-10-CM | POA: Insufficient documentation

## 2022-07-20 DIAGNOSIS — R140 Abdominal distension (gaseous): Secondary | ICD-10-CM | POA: Diagnosis not present

## 2022-07-20 DIAGNOSIS — Z7984 Long term (current) use of oral hypoglycemic drugs: Secondary | ICD-10-CM | POA: Diagnosis not present

## 2022-07-20 DIAGNOSIS — I10 Essential (primary) hypertension: Secondary | ICD-10-CM | POA: Diagnosis not present

## 2022-07-20 DIAGNOSIS — E119 Type 2 diabetes mellitus without complications: Secondary | ICD-10-CM | POA: Insufficient documentation

## 2022-07-20 LAB — URINALYSIS, ROUTINE W REFLEX MICROSCOPIC
Bilirubin Urine: NEGATIVE
Glucose, UA: 50 mg/dL — AB
Ketones, ur: NEGATIVE mg/dL
Leukocytes,Ua: NEGATIVE
Nitrite: NEGATIVE
Protein, ur: NEGATIVE mg/dL
Specific Gravity, Urine: 1.011 (ref 1.005–1.030)
pH: 5 (ref 5.0–8.0)

## 2022-07-20 LAB — CBC
HCT: 41.4 % (ref 36.0–46.0)
Hemoglobin: 13.6 g/dL (ref 12.0–15.0)
MCH: 30.4 pg (ref 26.0–34.0)
MCHC: 32.9 g/dL (ref 30.0–36.0)
MCV: 92.4 fL (ref 80.0–100.0)
Platelets: 306 10*3/uL (ref 150–400)
RBC: 4.48 MIL/uL (ref 3.87–5.11)
RDW: 13.6 % (ref 11.5–15.5)
WBC: 9.7 10*3/uL (ref 4.0–10.5)
nRBC: 0 % (ref 0.0–0.2)

## 2022-07-20 LAB — COMPREHENSIVE METABOLIC PANEL WITH GFR
ALT: 27 U/L (ref 0–44)
AST: 32 U/L (ref 15–41)
Albumin: 3.8 g/dL (ref 3.5–5.0)
Alkaline Phosphatase: 68 U/L (ref 38–126)
Anion gap: 8 (ref 5–15)
BUN: 14 mg/dL (ref 8–23)
CO2: 26 mmol/L (ref 22–32)
Calcium: 9.4 mg/dL (ref 8.9–10.3)
Chloride: 102 mmol/L (ref 98–111)
Creatinine, Ser: 0.87 mg/dL (ref 0.44–1.00)
GFR, Estimated: >60 mL/min
Glucose, Bld: 192 mg/dL — ABNORMAL HIGH (ref 70–99)
Potassium: 4 mmol/L (ref 3.5–5.1)
Sodium: 136 mmol/L (ref 135–145)
Total Bilirubin: 0.6 mg/dL (ref 0.3–1.2)
Total Protein: 7.2 g/dL (ref 6.5–8.1)

## 2022-07-20 LAB — LIPASE, BLOOD: Lipase: 34 U/L (ref 11–51)

## 2022-07-20 MED ORDER — IOHEXOL 300 MG/ML  SOLN
100.0000 mL | Freq: Once | INTRAMUSCULAR | Status: AC | PRN
  Administered 2022-07-20: 100 mL via INTRAVENOUS

## 2022-07-20 MED ORDER — ONDANSETRON HCL 4 MG/2ML IJ SOLN
4.0000 mg | Freq: Once | INTRAMUSCULAR | Status: AC
  Administered 2022-07-20: 4 mg via INTRAVENOUS
  Filled 2022-07-20: qty 2

## 2022-07-20 MED ORDER — SODIUM CHLORIDE 0.9 % IV BOLUS
500.0000 mL | Freq: Once | INTRAVENOUS | Status: AC
  Administered 2022-07-20: 500 mL via INTRAVENOUS

## 2022-07-20 MED ORDER — MORPHINE SULFATE (PF) 4 MG/ML IV SOLN
4.0000 mg | Freq: Once | INTRAVENOUS | Status: AC
  Administered 2022-07-20: 4 mg via INTRAVENOUS
  Filled 2022-07-20: qty 1

## 2022-07-20 NOTE — ED Notes (Signed)
Pt in CT.

## 2022-07-20 NOTE — ED Notes (Signed)
Called lab Will update UA now Backed up with downtime

## 2022-07-20 NOTE — ED Triage Notes (Signed)
Pt c/o right-sided abdominal pain x 2-3 months with severe new onset nausea since today. Pt has regurgitated a few times but no actual vomiting; hx chronic constipation. Appendix and gallbladder have both been removed.

## 2022-07-20 NOTE — Progress Notes (Unsigned)
   [name]     MRN: [id number]      DOB: Feb 13, 1955  Chief Complaint  Patient presents with   Nausea    Nausea worse in the last week R sided abdomen pain x 2-3 weeks    HPI [name] is here with a  3 month h/o RUQ pain radaiting to back rated between 3 to 4, gets up to a 10 sometimes awakens hers up to 10.associated with bloating and nausea, and has had vomiting, states she was awakened this morning with pain C/o malodorous urine, concerned about uti  ROS Denies recent fever or chills. Denies sinus pressure, nasal congestion, ear pain or sore throat. Denies chest congestion, productive cough or wheezing. Denies chest pains, palpitations and leg swelling    Denies dysuria, frequency, hesitancy or incontinence. Denies joint pain, swelling and limitation in mobility. Denies headaches, seizures, numbness, or tingling. Denies depression, anxiety or insomnia. Denies skin break down or rash.   PE  BP 117/80 (BP Location: Right Arm, Patient Position: Sitting, Cuff Size: Large)   Pulse (!) 104   Ht 5\' 8"  (1.727 m)   Wt 288 lb 0.6 oz (130.7 kg)   SpO2 92%   BMI 43.80 kg/m   Patient alert and oriented and in no cardiopulmonary distress.Patient in pain  HEENT: No facial asymmetry, EOMI,     Neck supple .  Chest: Clear to auscultation bilaterally.  CVS: S1, S2 no murmurs, no S3.Regular rate.  ABD: Soft tender in RUQ and  lower abdomen with questionable rebound on palpation of LLQ  Ext: No edema  MS: Adequate ROM spine, shoulders, hips and knees.  Skin: Intact, no ulcerations or rash noted.  Psych: Good eye contact, normal affect. Memory intact not anxious or depressed appearing.  CNS: CN 2-12 intact, power,  normal throughout.no focal deficits noted.   Assessment & Plan  Malodorous urine Will need to be checked for UTI  RUQ pain Symptoms of severity of pain ,physical exam,  along with N/V bloating, needs stat cT scan , and labs, to include amylase and lipase  will send to the ED ,case discussed with Ed Physician

## 2022-07-20 NOTE — Patient Instructions (Signed)
F/U as before, call if you nbed me sooner  Stat CBC and diff cmp and EGFr, amylase and lipase at the hospital as soon as you leave here  CCUA in office today will culture if abnormal  An urgent scan of your abdomen is requested for today we will let you know at checkout  Pt was redirected to the ED due to symptom severity   You are referred to your GI Dr Russella Dar for follow up/ ongoing evaluation of abdominal pain  Thanks for choosing [facility], we consider it a privelige to serve you.

## 2022-07-20 NOTE — Assessment & Plan Note (Signed)
Will need to be checked for UTI

## 2022-07-20 NOTE — ED Provider Notes (Signed)
[place] EMERGENCY DEPARTMENT AT [facility] Provider Note   CSN: [id number] Arrival date & time: 07/20/22  1427     History  Chief Complaint  Patient presents with   Abdominal Pain   HPI [name] is a 66 y.o. female with hypertension, DM, hyperlipidemia, GERD, status post appendectomy, abdominal hysterectomy and cholecystectomy presenting for abdominal pain.  States she has had right-sided abdominal pain for 2 to 3 months..  Woke up this morning it was acutely worse.  Pain begins in the right mid abdomen and radiates to the back and to the right groin.  Also endorses malodorous urine but no other urinary symptoms.  Endorses nausea but no vomiting and diarrhea.  Last bowel movement this morning.  Denies abnormal vaginal bleeding discharge.   Abdominal Pain      Home Medications Prior to Admission medications   Medication Sig Start Date End Date Taking? Authorizing Provider  aspirin 81 MG tablet Take 81 mg by mouth daily.    [provider]  benazepril (LOTENSIN) 5 MG tablet Take 1 tablet (5 mg total) by mouth daily. 07/07/22   Kerri Perches, MD  calcium-vitamin D (OSCAL WITH D) 500-200 MG-UNIT per tablet Take 1 tablet by mouth daily.    [provider]  cyclobenzaprine (FLEXERIL) 10 MG tablet Take 10 mg by mouth 3 (three) times daily as needed. 07/20/21   [provider]  Dexlansoprazole 30 MG capsule DR TAKE 1 CAPSULE(30 MG) BY MOUTH TWICE DAILY BEFORE A MEAL 07/07/22   Kerri Perches, MD  fluticasone (FLONASE) 50 MCG/ACT nasal spray USE 2 SPRAYS IN EACH NOSTRIL ONCE DAILY. SHAKE GENTLY BEFORE USING. 07/07/22   Kerri Perches, MD  furosemide (LASIX) 20 MG tablet TAKE ONE TABLET BY MOUTH ONCE DAILY FOR LEG SWELLING. 07/07/22   Kerri Perches, MD  gabapentin (NEURONTIN) 300 MG capsule Take 1 capsule (300 mg total) by mouth 2 (two) times daily. 07/07/22   Kerri Perches, MD  glipiZIDE (GLUCOTROL XL) 10 MG 24 hr tablet  TAKE TWO (2) TABLETS BY MOUTH ONCE DAILY EVERY MORNING WITH BREAKFAST 07/07/22   Kerri Perches, MD  glucose blood (ONETOUCH VERIO) test strip USE TO TEST BLOOD SUGAR EVERY DAY 07/07/22   Kerri Perches, MD  Lancets [facility] DELICA PLUS LANCET33G) MISC USE TO TEST BLOOD SUGAR ONCE DAILY 07/07/22   Kerri Perches, MD  levothyroxine (SYNTHROID) 150 MCG tablet Take 1 tablet (150 mcg total) by mouth daily. 07/07/22   Kerri Perches, MD  metFORMIN (GLUCOPHAGE) 1000 MG tablet Take 1 tablet (1,000 mg total) by mouth 2 (two) times daily with a meal. 07/07/22   Kerri Perches, MD  montelukast (SINGULAIR) 10 MG tablet Take 1 tablet (10 mg total) by mouth at bedtime. 07/07/22   Kerri Perches, MD  ondansetron (ZOFRAN) 4 MG tablet Take 1 tablet (4 mg total) by mouth every 6 (six) hours as needed for nausea or vomiting. 04/29/21   Esterwood, Amy S, PA-C  oxyCODONE-acetaminophen (PERCOCET) 10-325 MG tablet Take 1 tablet by mouth 4 (four) times daily as needed for pain. 01/20/22   [provider]  potassium chloride SA (KLOR-CON M) 20 MEQ tablet TAKE ONE TABLET BY MOUTH EVERY DAY AS NEEDED ON DAYS YOU TAKE FUROSEMIDE FOR SWELLING 07/07/22   Kerri Perches, MD  rosuvastatin (CRESTOR) 40 MG tablet Take 1 tablet (40 mg total) by mouth daily. 07/07/22   Kerri Perches, MD  traZODone (DESYREL) 100  MG tablet Take 1 tablet (100 mg total) by mouth at bedtime. 07/07/22   Kerri Perches, MD  Turmeric 500 MG CAPS Take 1 capsule by mouth daily.    [provider]  venlafaxine XR (EFFEXOR XR) 37.5 MG 24 hr capsule Take 1 capsule (37.5 mg total) by mouth daily with breakfast. 07/07/22   Kerri Perches, MD      Allergies    Ozempic (0.25 or 0.5 mg-dose) [semaglutide(0.25 or 0.5mg -dos)], Naproxen sodium, Tsh [thyrotropin], and Statins    Review of Systems   Review of Systems  Gastrointestinal:  Positive for abdominal pain.    Physical Exam   Vitals:   07/20/22 1730  07/20/22 1745  BP: (!) 112/57 115/65  Pulse: 85 79  Resp:  16  Temp:  97.7 F (36.5 C)  SpO2: 96% 96%    CONSTITUTIONAL:  well-appearing, NAD NEURO:  Alert and oriented x 3, CN 3-12 grossly intact EYES:  eyes equal and reactive ENT/NECK:  Supple, no stridor  CARDIO:  Regular rate and rhythm, appears well-perfused  PULM:  No respiratory distress, CTAB GI/GU:  non-distended, soft, right mid abdominal tenderness and right CVA tenderness MSK/SPINE:  No gross deformities, no edema, moves all extremities  SKIN:  no rash, atraumatic  *Additional and/or pertinent findings included in MDM below   ED Results / Procedures / Treatments   Labs (all labs ordered are listed, but only abnormal results are displayed) Labs Reviewed  COMPREHENSIVE METABOLIC PANEL - Abnormal; Notable for the following components:      Result Value   Glucose, Bld 192 (*)    All other components within normal limits  URINALYSIS, ROUTINE W REFLEX MICROSCOPIC - Abnormal; Notable for the following components:   Glucose, UA 50 (*)    Hgb urine dipstick SMALL (*)    Bacteria, UA RARE (*)    All other components within normal limits  URINE CULTURE  LIPASE, BLOOD  CBC    EKG None  Radiology CT ABDOMEN PELVIS W CONTRAST  Result Date: 07/20/2022 CLINICAL DATA:  Right abdominal pain for 2-3 months with new onset nausea. EXAM: CT ABDOMEN AND PELVIS WITH CONTRAST TECHNIQUE: Multidetector CT imaging of the abdomen and pelvis was performed using the standard protocol following bolus administration of intravenous contrast. RADIATION DOSE REDUCTION: This exam was performed according to the departmental dose-optimization program which includes automated exposure control, adjustment of the mA and/or kV according to patient size and/or use of iterative reconstruction technique. CONTRAST:  OMNIPAQUE IOHEXOL 300 MG/ML  SOLN COMPARISON:  CT abdomen/pelvis 04/18/2021 FINDINGS: Lower chest: The lung bases are clear. The imaged  heart is unremarkable. Hepatobiliary: The liver is unremarkable. The gallbladder is surgically absent. There is no biliary ductal dilatation. Pancreas: Unremarkable. Spleen: Unremarkable. Adrenals/Urinary Tract: The adrenals are unremarkable. The kidneys are unremarkable, with no focal lesion, stone, hydronephrosis, or hydroureter. There is symmetric excretion of contrast into the collecting systems on the delayed images. The bladder is unremarkable. Stomach/Bowel: The stomach is unremarkable. There is no evidence of bowel obstruction. There is no abnormal bowel wall thickening or inflammatory change. The appendix is not identified, presumed surgically absent. Vascular/Lymphatic: The abdominal aorta is normal in course and caliber with mild calcified plaque. The major branch vessels are patent. The main portal and splenic veins are patent. There is no abdominopelvic lymphadenopathy. Reproductive: The uterus is surgically absent. There is no adnexal mass. Other: There is no ascites or free air. Mild nonspecific haziness in the mesenteric fat is unchanged.  Musculoskeletal: There is no acute osseous abnormality or suspicious osseous lesion. IMPRESSION: No acute finding in the abdomen or pelvis. Electronically Signed   By: Lesia Hausen M.D.   On: 07/20/2022 16:14    Procedures Procedures    Medications Ordered in ED Medications  ondansetron (ZOFRAN) injection 4 mg (4 mg Intravenous Given 07/20/22 1530)  morphine (PF) 4 MG/ML injection 4 mg (4 mg Intravenous Given 07/20/22 1530)  sodium chloride 0.9 % bolus 500 mL (0 mLs Intravenous Stopped 07/20/22 1623)  iohexol (OMNIPAQUE) 300 MG/ML solution 100 mL (100 mLs Intravenous Contrast Given 07/20/22 1558)    ED Course/ Medical Decision Making/ A&P                             Medical Decision Making Amount and/or Complexity of Data Reviewed Labs: ordered. Radiology: ordered.  Risk Prescription drug management.   Initial Impression and Ddx 67 year old  well-appearing female presenting for abdominal pain.  Exam notable for right-sided abdominal and flank tenderness.  Ddx includes acute cholecystitis, pancreatitis, nephrolithiasis, pyelonephritis, UTI, bowel obstruction Patient PMH that increases complexity of ED encounter: hypertension, DM, hyperlipidemia, GERD, status post appendectomy, abdominal hysterectomy and cholecystectomy    Interpretation of Diagnostics I independent reviewed and interpreted the labs as followed: Hematuria  - I independently visualized the following imaging with scope of interpretation limited to determining acute life threatening conditions related to emergency care: CT ab/pelvis, which revealed no acute findings  Patient Reassessment and Ultimate Disposition/Management Symptoms improved after treatment.  No suggestions of acute process on CT to explain her symptoms today.  With her malodorous urine and hematuria there was some concern for early UTI.  Patient refused treatment at this time which I felt was appropriate and advised her to follow-up with her PCP.  Send off reflex urine culture and advised to seek treatment if her symptoms get worse.  Vitals remained stable throughout encounter.  Discussed return precautions.  Discharged.  Patient management required discussion with the following services or consulting groups:  Hospitalist Service  Complexity of Problems Addressed Acute complicated illness or Injury  Additional Data Reviewed and Analyzed Further history obtained from: Past medical history and medications listed in the EMR and Prior ED visit notes  Patient Encounter Risk Assessment None         Final Clinical Impression(s) / ED Diagnoses Final diagnoses:  Abdominal pain, unspecified abdominal location  Malodorous urine    Rx / DC Orders ED Discharge Orders     None         Gareth Eagle, PA-C 07/20/22 1757    Eber Hong, MD 07/30/22 [phone]

## 2022-07-20 NOTE — Discharge Instructions (Signed)
Evaluation today was reassuring.  Again, wanted to remind you that I did send off a urine culture.  You can follow-up on the results in your MyChart.  Recommend you follow-up with your PCP.  If you have painful urination, bloody urine, flank pain or worsening abdominal pain, new fever or any other concerning symptom please return emerged part for further evaluation.

## 2022-07-21 ENCOUNTER — Telehealth: Payer: Self-pay | Admitting: Family Medicine

## 2022-07-21 ENCOUNTER — Other Ambulatory Visit: Payer: Self-pay

## 2022-07-21 DIAGNOSIS — R1011 Right upper quadrant pain: Secondary | ICD-10-CM | POA: Insufficient documentation

## 2022-07-21 MED ORDER — NITROFURANTOIN MONOHYD MACRO 100 MG PO CAPS: 100.0000 mg | ORAL_CAPSULE | Freq: Two times a day (BID) | ORAL | 0 refills | Status: DC

## 2022-07-21 NOTE — Telephone Encounter (Signed)
LVM letting patient know.

## 2022-07-21 NOTE — Telephone Encounter (Signed)
[name] called said was sent to the ER yesterday, the ER did not give her no antibiotics, asked if Dr Lodema Hong can something in once get the culture back, per [name] the ER thought she has a UTI.

## 2022-07-21 NOTE — Assessment & Plan Note (Signed)
Symptoms of severity of pain ,physical exam,  along with N/V bloating, needs stat cT scan , and labs, to include amylase and lipase will send to the ED ,case discussed with Ed Physician

## 2022-07-22 ENCOUNTER — Ambulatory Visit: Payer: Medicare Other | Admitting: Dermatology

## 2022-07-22 LAB — URINE CULTURE: Culture: <10000 — AB

## 2022-07-26 DIAGNOSIS — D8481 Immunodeficiency due to conditions classified elsewhere: Secondary | ICD-10-CM | POA: Diagnosis not present

## 2022-07-26 DIAGNOSIS — G4700 Insomnia, unspecified: Secondary | ICD-10-CM | POA: Diagnosis not present

## 2022-07-28 ENCOUNTER — Telehealth: Payer: Self-pay | Admitting: *Deleted

## 2022-07-28 ENCOUNTER — Encounter: Payer: Self-pay | Admitting: Internal Medicine

## 2022-07-28 ENCOUNTER — Ambulatory Visit (INDEPENDENT_AMBULATORY_CARE_PROVIDER_SITE_OTHER): Payer: Medicare Other | Admitting: Internal Medicine

## 2022-07-28 VITALS — BP 132/78 | HR 105 | Ht 68.0 in | Wt 285.2 lb

## 2022-07-28 DIAGNOSIS — Z09 Encounter for follow-up examination after completed treatment for conditions other than malignant neoplasm: Secondary | ICD-10-CM | POA: Diagnosis not present

## 2022-07-28 DIAGNOSIS — R109 Unspecified abdominal pain: Secondary | ICD-10-CM

## 2022-07-28 DIAGNOSIS — R1011 Right upper quadrant pain: Secondary | ICD-10-CM | POA: Diagnosis not present

## 2022-07-28 MED ORDER — DICYCLOMINE HCL 10 MG PO CAPS
10.0000 mg | ORAL_CAPSULE | Freq: Three times a day (TID) | ORAL | 0 refills | Status: AC | PRN
Start: 2022-07-28 — End: ?

## 2022-07-28 NOTE — Assessment & Plan Note (Addendum)
Unclear etiology she is status postcholecystectomy Recent CT abdomen was negative for any acute pathology Started Bentyl as needed for abdominal cramping If persistent, needs to discuss with GI Although she was recently treated for UTI, her symptoms are not consistent with UTI Could be related to DDD of thoracolumbar spine - she has Norco as needed for severe pain

## 2022-07-28 NOTE — Telephone Encounter (Signed)
Transition Care Management Follow-up Telephone Call Date of discharge and from where: [name] 6/25 How have you been since you were released from the hospital? Feeling good  Any questions or concerns? No  Items Reviewed: Did the pt receive and understand the discharge instructions provided? Yes  Medications obtained and verified? No  Other? No  Any new allergies since your discharge? No  Dietary orders reviewed? No Do you have support at home? Yes    Follow up appointments reviewed:  PCP Hospital f/u appt confirmed? Yes   Are transportation arrangements needed? No  If their condition worsens, is the pt aware to call PCP or go to the Emergency Dept.? Yes Was the patient provided with contact information for the PCP's office or ED? Yes Was to pt encouraged to call back with questions or concerns? Yes

## 2022-07-28 NOTE — Assessment & Plan Note (Signed)
Started Bentyl as needed for abdominal cramping She has tried Flexeril, but advised to take Flexeril only for back muscle cramping

## 2022-07-28 NOTE — Patient Instructions (Signed)
Please take Bentyl as needed for abdominal cramping.  Please keep food diary to see association of symptoms to any specific food.  Please maintain at least 64 ounces of fluid intake.

## 2022-07-28 NOTE — Assessment & Plan Note (Signed)
ER chart reviewed, including imaging Was told of UTI, but her symptoms are not consistent with UTI

## 2022-07-28 NOTE — Progress Notes (Signed)
Acute Office Visit  Subjective:    Patient ID: [name], female    DOB: [birth year], 67 y.o.   MRN: [id number]  Chief Complaint  Patient presents with   Follow-up    ER follow up     HPI Patient is in today for follow-up of recent ER visit for an sudden onset right upper quadrant abdominal pain.  She was having intermittent right upper quadrant abdominal pain for the last 2-3 months, but had acute worsening of abdominal pain on 06/25.  She went to ER and had CT abdomen done, which did not show any acute pathology.  She was told of UTI.  She was later given Macrobid for UTI, although her urine culture showed less than 10,000 CFU.  She still has intermittent right upper quadrant abdominal pain and abdominal cramps.  Of note, she has history of cholecystectomy.  She also reports mild nausea at times.  Denies any constipation or diarrhea.  Denies any melena or hematochezia.  She takes Dexilant for GERD.  Past Medical History:  Diagnosis Date   Allergy    Anemia    Arthritis    Cataract    forming   COVID-19    Diabetes mellitus without complication (HCC) 2011   GERD (gastroesophageal reflux disease)    History of colon polyps    Hyperlipidemia    Hypertension 2011   Morbid obesity (HCC)    Neuromuscular disorder (HCC)    issues with nerves in back    Osteopenia    Thyroid disease     Past Surgical History:  Procedure Laterality Date   ABDOMINAL HYSTERECTOMY     APPENDECTOMY     CHOLECYSTECTOMY     COLONOSCOPY  2009   [facility] - normal per pt    TONSILLECTOMY      Family History  Problem Relation Age of Onset   Asthma Mother    Hypertension Mother    Heart disease Mother 48       cabg   Pancreatic cancer Mother    Diabetes Father    Hypertension Father    Heart disease Father 14       cabg   Cancer Sister        breast, uterine   Cancer Sister    Cancer Maternal Grandmother    Prostate cancer Paternal Grandmother    Cancer Paternal Grandfather     Colon polyps Neg Hx    Colon cancer Neg Hx    Esophageal cancer Neg Hx    Rectal cancer Neg Hx    Stomach cancer Neg Hx     Social History   Socioeconomic History   Marital status: Married    Spouse name: Tashya Patch   Number of children: 2   Years of education: 12   Highest education level: 12th grade  Occupational History   Occupation: disabled   Tobacco Use   Smoking status: Never    Passive exposure: Never   Smokeless tobacco: Never  Vaping Use   Vaping Use: Never used  Substance and Sexual Activity   Alcohol use: No   Drug use: No   Sexual activity: Not Currently  Other Topics Concern   Not on file  Social History Narrative   Lives with Husband alone    Social Determinants of Health   Financial Resource Strain: Low Risk  (03/27/2021)   Overall Financial Resource Strain (CARDIA)    Difficulty of Paying Living Expenses: Not hard at all  Food  Insecurity: No Food Insecurity (05/19/2021)   Hunger Vital Sign    Worried About Running Out of Food in the Last Year: Never true    Ran Out of Food in the Last Year: Never true  Transportation Needs: No Transportation Needs (05/19/2021)   PRAPARE - Administrator, Civil Service (Medical): No    Lack of Transportation (Non-Medical): No  Physical Activity: Insufficiently Active (03/27/2021)   Exercise Vital Sign    Days of Exercise per Week: 4 days    Minutes of Exercise per Session: 30 min  Stress: Stress Concern Present (03/27/2021)   [employer] of Occupational Health - Occupational Stress Questionnaire    Feeling of Stress : Rather much  Social Connections: Moderately Integrated (03/27/2021)   Social Connection and Isolation Panel [NHANES]    Frequency of Communication with Friends and Family: More than three times a week    Frequency of Social Gatherings with Friends and Family: More than three times a week    Attends Religious Services: More than 4 times per year    Active Member of [employer] or Organizations:  No    Attends Banker Meetings: Never    Marital Status: Married  Recent Concern: Social Connections - Moderately Isolated (02/09/2021)   Social Connection and Isolation Panel [NHANES]    Frequency of Communication with Friends and Family: More than three times a week    Frequency of Social Gatherings with Friends and Family: More than three times a week    Attends Religious Services: More than 4 times per year    Active Member of [employer] or Organizations: No    Attends Banker Meetings: Never    Marital Status: Separated  Intimate Partner Violence: Not At Risk (03/27/2021)   Humiliation, Afraid, Rape, and Kick questionnaire    Fear of Current or Ex-Partner: No    Emotionally Abused: No    Physically Abused: No    Sexually Abused: No    Outpatient Medications Prior to Visit  Medication Sig Dispense Refill   aspirin 81 MG tablet Take 81 mg by mouth daily.     benazepril (LOTENSIN) 5 MG tablet Take 1 tablet (5 mg total) by mouth daily. 30 tablet 3   calcium-vitamin D (OSCAL WITH D) 500-200 MG-UNIT per tablet Take 1 tablet by mouth daily.     cyclobenzaprine (FLEXERIL) 10 MG tablet Take 10 mg by mouth 3 (three) times daily as needed.     Dexlansoprazole 30 MG capsule DR TAKE 1 CAPSULE(30 MG) BY MOUTH TWICE DAILY BEFORE A MEAL 180 capsule 3   fluticasone (FLONASE) 50 MCG/ACT nasal spray USE 2 SPRAYS IN EACH NOSTRIL ONCE DAILY. SHAKE GENTLY BEFORE USING. 16 g 3   furosemide (LASIX) 20 MG tablet TAKE ONE TABLET BY MOUTH ONCE DAILY FOR LEG SWELLING. 30 tablet 5   gabapentin (NEURONTIN) 300 MG capsule Take 1 capsule (300 mg total) by mouth 2 (two) times daily. 180 capsule 3   glipiZIDE (GLUCOTROL XL) 10 MG 24 hr tablet TAKE TWO (2) TABLETS BY MOUTH ONCE DAILY EVERY MORNING WITH BREAKFAST 180 tablet 3   glucose blood (ONETOUCH VERIO) test strip USE TO TEST BLOOD SUGAR EVERY DAY 100 strip 5   Lancets (ONETOUCH DELICA PLUS LANCET33G) MISC USE TO TEST BLOOD SUGAR ONCE DAILY  100 each 5   levothyroxine (SYNTHROID) 150 MCG tablet Take 1 tablet (150 mcg total) by mouth daily. 90 tablet 1   metFORMIN (GLUCOPHAGE) 1000 MG tablet Take 1 tablet (  1,000 mg total) by mouth 2 (two) times daily with a meal. 180 tablet 1   montelukast (SINGULAIR) 10 MG tablet Take 1 tablet (10 mg total) by mouth at bedtime. 90 tablet 1   nitrofurantoin, macrocrystal-monohydrate, (MACROBID) 100 MG capsule Take 1 capsule (100 mg total) by mouth 2 (two) times daily. 10 capsule 0   ondansetron (ZOFRAN) 4 MG tablet Take 1 tablet (4 mg total) by mouth every 6 (six) hours as needed for nausea or vomiting. 40 tablet 2   oxyCODONE-acetaminophen (PERCOCET) 10-325 MG tablet Take 1 tablet by mouth 4 (four) times daily as needed for pain.     potassium chloride SA (KLOR-CON M) 20 MEQ tablet TAKE ONE TABLET BY MOUTH EVERY DAY AS NEEDED ON DAYS YOU TAKE FUROSEMIDE FOR SWELLING 30 tablet 5   rosuvastatin (CRESTOR) 40 MG tablet Take 1 tablet (40 mg total) by mouth daily. 90 tablet 3   traZODone (DESYREL) 100 MG tablet Take 1 tablet (100 mg total) by mouth at bedtime. 90 tablet 1   Turmeric 500 MG CAPS Take 1 capsule by mouth daily.     venlafaxine XR (EFFEXOR XR) 37.5 MG 24 hr capsule Take 1 capsule (37.5 mg total) by mouth daily with breakfast. 30 capsule 3   No facility-administered medications prior to visit.    Allergies  Allergen Reactions   Ozempic (0.25 Or 0.5 Mg-Dose) [Semaglutide(0.25 Or 0.5mg -Dos)] Nausea Only    dizzy   Naproxen Sodium Hives and Swelling    MD thought it was the pink dye in the naproxen   Tsh [Thyrotropin]    Statins Other (See Comments)    Reports increased appetite and possible muscle ache; tolerating rosuvastatin 40 mg daily    Review of Systems  Constitutional:  Negative for chills and fever.  HENT:  Negative for congestion, sinus pressure, sinus pain and sore throat.   Eyes:  Negative for pain and discharge.  Respiratory:  Negative for cough and shortness of breath.    Cardiovascular:  Negative for chest pain and palpitations.  Gastrointestinal:  Positive for abdominal pain and nausea. Negative for constipation, diarrhea and vomiting.  Endocrine: Negative for polydipsia and polyuria.  Genitourinary:  Negative for dysuria and hematuria.  Musculoskeletal:  Positive for back pain. Negative for neck pain and neck stiffness.  Skin:  Negative for rash.  Neurological:  Negative for dizziness and weakness.  Psychiatric/Behavioral:  Negative for agitation and behavioral problems.        Objective:    Physical Exam Vitals reviewed.  Constitutional:      General: She is not in acute distress.    Appearance: She is obese. She is not diaphoretic.  HENT:     Head: Normocephalic and atraumatic.  Eyes:     General: No scleral icterus.    Extraocular Movements: Extraocular movements intact.  Cardiovascular:     Rate and Rhythm: Normal rate and regular rhythm.     Heart sounds: Normal heart sounds. No murmur heard. Pulmonary:     Breath sounds: Normal breath sounds. No wheezing or rales.  Abdominal:     Palpations: Abdomen is soft.     Tenderness: There is abdominal tenderness (To light palpation in RUQ). There is no guarding or rebound.  Musculoskeletal:     Cervical back: Neck supple. No tenderness.     Right lower leg: No edema.     Left lower leg: No edema.  Skin:    General: Skin is warm.     Findings: No rash.  Neurological:     General: No focal deficit present.     Mental Status: She is alert and oriented to person, place, and time.  Psychiatric:        Mood and Affect: Mood normal.        Behavior: Behavior normal.     BP 132/78 (BP Location: Right Arm, Patient Position: Sitting, Cuff Size: Large)   Pulse (!) 105   Ht 5\' 8"  (1.727 m)   Wt 285 lb 3.2 oz (129.4 kg)   SpO2 95%   BMI 43.36 kg/m  Wt Readings from Last 3 Encounters:  07/28/22 285 lb 3.2 oz (129.4 kg)  07/20/22 288 lb (130.6 kg)  07/20/22 288 lb 0.6 oz (130.7 kg)         Assessment & Plan:   Problem List Items Addressed This Visit       Other   Abdominal cramping    Started Bentyl as needed for abdominal cramping She has tried Flexeril, but advised to take Flexeril only for back muscle cramping      Relevant Medications   dicyclomine (BENTYL) 10 MG capsule   RUQ abdominal pain - Primary    Unclear etiology she is status postcholecystectomy Recent CT abdomen was negative for any acute pathology Started Bentyl as needed for abdominal cramping If persistent, needs to discuss with GI Although she was recently treated for UTI, her symptoms are not consistent with UTI Could be related to DDD of thoracolumbar spine - she has Norco as needed for severe pain      Encounter for examination following treatment at hospital    ER chart reviewed, including imaging Was told of UTI, but her symptoms are not consistent with UTI        Meds ordered this encounter  Medications   dicyclomine (BENTYL) 10 MG capsule    Sig: Take 1 capsule (10 mg total) by mouth 3 (three) times daily as needed for spasms.    Dispense:  30 capsule    Refill:  0     Nickalus Thornsberry Concha Se, MD

## 2022-08-09 DIAGNOSIS — H52222 Regular astigmatism, left eye: Secondary | ICD-10-CM | POA: Diagnosis not present

## 2022-08-09 DIAGNOSIS — H524 Presbyopia: Secondary | ICD-10-CM | POA: Diagnosis not present

## 2022-08-09 DIAGNOSIS — H5203 Hypermetropia, bilateral: Secondary | ICD-10-CM | POA: Diagnosis not present

## 2022-08-09 LAB — HM DIABETES EYE EXAM

## 2022-09-07 DIAGNOSIS — G894 Chronic pain syndrome: Secondary | ICD-10-CM | POA: Diagnosis not present

## 2022-09-07 DIAGNOSIS — E785 Hyperlipidemia, unspecified: Secondary | ICD-10-CM | POA: Diagnosis not present

## 2022-09-07 DIAGNOSIS — M5416 Radiculopathy, lumbar region: Secondary | ICD-10-CM | POA: Diagnosis not present

## 2022-09-07 DIAGNOSIS — M47816 Spondylosis without myelopathy or radiculopathy, lumbar region: Secondary | ICD-10-CM | POA: Diagnosis not present

## 2022-09-07 DIAGNOSIS — I10 Essential (primary) hypertension: Secondary | ICD-10-CM | POA: Diagnosis not present

## 2022-09-07 DIAGNOSIS — E1142 Type 2 diabetes mellitus with diabetic polyneuropathy: Secondary | ICD-10-CM | POA: Diagnosis not present

## 2022-09-07 DIAGNOSIS — M150 Primary generalized (osteo)arthritis: Secondary | ICD-10-CM | POA: Diagnosis not present

## 2022-09-07 DIAGNOSIS — M62838 Other muscle spasm: Secondary | ICD-10-CM | POA: Diagnosis not present

## 2022-09-07 DIAGNOSIS — G5711 Meralgia paresthetica, right lower limb: Secondary | ICD-10-CM | POA: Diagnosis not present

## 2022-09-07 DIAGNOSIS — E1159 Type 2 diabetes mellitus with other circulatory complications: Secondary | ICD-10-CM | POA: Diagnosis not present

## 2022-09-07 DIAGNOSIS — M47812 Spondylosis without myelopathy or radiculopathy, cervical region: Secondary | ICD-10-CM | POA: Diagnosis not present

## 2022-09-14 ENCOUNTER — Ambulatory Visit (INDEPENDENT_AMBULATORY_CARE_PROVIDER_SITE_OTHER): Payer: Medicare Other | Admitting: Family Medicine

## 2022-09-14 ENCOUNTER — Encounter: Payer: Self-pay | Admitting: Family Medicine

## 2022-09-14 VITALS — BP 105/69 | HR 98 | Ht 68.0 in | Wt 285.1 lb

## 2022-09-14 DIAGNOSIS — E039 Hypothyroidism, unspecified: Secondary | ICD-10-CM | POA: Diagnosis not present

## 2022-09-14 DIAGNOSIS — I10 Essential (primary) hypertension: Secondary | ICD-10-CM | POA: Diagnosis not present

## 2022-09-14 DIAGNOSIS — M5410 Radiculopathy, site unspecified: Secondary | ICD-10-CM

## 2022-09-14 DIAGNOSIS — E785 Hyperlipidemia, unspecified: Secondary | ICD-10-CM

## 2022-09-14 DIAGNOSIS — E1169 Type 2 diabetes mellitus with other specified complication: Secondary | ICD-10-CM | POA: Diagnosis not present

## 2022-09-14 NOTE — Patient Instructions (Addendum)
F/u in 6 weeks with blood sugar log, call if you need me sooner  Fasting blood sugar needs to be UNDER 150, test and record every mornuing  It is important that you exercise regularly at least 30 minutes 5 times a week. If you develop chest pain, have severe difficulty breathing, or feel very tired, stop exercising immediately and seek medical attention    You need colonoscopy and flu vaccine otherwise you are up to date with all health maintanace  Thanks for choosing [facility], we consider it a privelige to serve you.

## 2022-09-18 ENCOUNTER — Encounter: Payer: Self-pay | Admitting: Family Medicine

## 2022-09-18 NOTE — Assessment & Plan Note (Signed)
  Patient re-educated about  the importance of commitment to a  minimum of 150 minutes of exercise per week as able.  The importance of healthy food choices with portion control discussed, as well as eating regularly and within a 12 hour window most days. The need to choose "clean , green" food 50 to 75% of the time is discussed, as well as to make water the primary drink and set a goal of 64 ounces water daily.       09/14/2022    8:04 AM 07/28/2022    2:52 PM 07/20/2022    2:44 PM  Weight /BMI  Weight 285 lb 1.9 oz 285 lb 3.2 oz 288 lb  Height 5\' 8"  (1.727 m) 5\' 8"  (1.727 m) 5\' 8"  (1.727 m)  BMI 43.35 kg/m2 43.36 kg/m2 43.79 kg/m2    Unchanged

## 2022-09-18 NOTE — Assessment & Plan Note (Signed)
Uncontrolled and deteriorated [name] is reminded of the importance of commitment to daily physical activity for 30 minutes or more, as able and the need to limit carbohydrate intake to 30 to 60 grams per meal to help with blood sugar control.   The need to take medication as prescribed, test blood sugar as directed, and to call between visits if there is a concern that blood sugar is uncontrolled is also discussed.   [name] is reminded of the importance of daily foot exam, annual eye examination, and good blood sugar, blood pressure and cholesterol control.     Latest Ref Rng & Units 09/07/2022    9:17 AM 07/20/2022    3:14 PM 05/12/2022    8:56 AM 01/22/2022    8:51 AM 10/21/2021    8:14 AM  Diabetic Labs  HbA1c 4.8 - 5.6 % 10.5   8.1  7.4  7.2   Micro/Creat Ratio 0 - 29 mg/g creat     <3   Chol 100 - 199 mg/dL 324    401    HDL >02 mg/dL 55    53    Calc LDL 0 - 99 mg/dL 64    66    Triglycerides 0 - 149 mg/dL 725    366    Creatinine 0.57 - 1.00 mg/dL 4.40  3.47  4.25  9.56  0.92       09/14/2022    8:04 AM 07/28/2022    2:52 PM 07/20/2022    5:45 PM 07/20/2022    5:30 PM 07/20/2022    5:15 PM 07/20/2022    5:00 PM 07/20/2022    4:45 PM  BP/Weight  Systolic BP 105 132 115 112 122 114 124  Diastolic BP 69 78 65 57 59 69 68  Wt. (Lbs) 285.12 285.2       BMI 43.35 kg/m2 43.36 kg/m2           10/27/2021    8:20 AM 01/09/2021   10:00 AM  Foot/eye exam completion dates  Foot Form Completion Done Done      Call with numbers weekly, and may consider adding glipizide, intolerant of most meds used in the past

## 2022-09-18 NOTE — Assessment & Plan Note (Signed)
Hyperlipidemia:Low fat diet discussed and encouraged.   Lipid Panel  Lab Results  Component Value Date   CHOL 142 09/07/2022   HDL 55 09/07/2022   LDLCALC 64 09/07/2022   TRIG 132 09/07/2022   CHOLHDL 2.6 09/07/2022     Controlled, no change in medication

## 2022-09-18 NOTE — Progress Notes (Signed)
[name]     MRN: [id number]      DOB: Jul 17, 1955  Chief Complaint  Patient presents with   Follow-up    4 month follow up    HPI [name] is here for follow up and re-evaluation of chronic medical conditions, medication management and review of any available recent lab and radiology data.  Preventive health is updated, specifically  Cancer screening and Immunization.   Has not been following diet , stress eating since she is at home most of the time, also challenged with major water bill. States she will modify diet, test regularly but continues to resist insulin Has son with cancer she sees 1 to 2 times / month which is also stressful Has noted blood sugars to be levated when she tests   ROS Denies recent fever or chills. Denies sinus pressure, nasal congestion, ear pain or sore throat. Denies chest congestion, productive cough or wheezing. Denies chest pains, palpitations and leg swelling Denies abdominal pain, nausea, vomiting,diarrhea or constipation.   Denies dysuria, frequency, hesitancy or incontinence. Denies uncontrolled  joint pain, swelling and limitation in mobility. Denies headaches, seizures, numbness, or tingling.  Denies skin break down or rash.   PE  BP 105/69 (BP Location: Right Arm, Patient Position: Sitting, Cuff Size: Large)   Pulse 98   Ht 5\' 8"  (1.727 m)   Wt 285 lb 1.9 oz (129.3 kg)   SpO2 91%   BMI 43.35 kg/m   Patient alert and oriented and in no cardiopulmonary distress.  HEENT: No facial asymmetry, EOMI,     Neck supple .  Chest: Clear to auscultation bilaterally.  CVS: S1, S2 no murmurs, no S3.Regular rate.  ABD: Soft non tender.   Ext: No edema  MS: Decreased  ROM spine,  hips and knees.  Skin: Intact, no ulcerations or rash noted.  Psych: Good eye contact, normal affect. Memory intact not anxious or depressed appearing.  CNS: CN 2-12 intact, power,  normal throughout.no focal deficits noted.   Assessment &  Plan  Hypertension goal BP (blood pressure) < 130/80 Controlled, no change in medication DASH diet and commitment to daily physical activity for a minimum of 30 minutes discussed and encouraged, as a part of hypertension management. The importance of attaining a healthy weight is also discussed.     09/14/2022    8:04 AM 07/28/2022    2:52 PM 07/20/2022    5:45 PM 07/20/2022    5:30 PM 07/20/2022    5:15 PM 07/20/2022    5:00 PM 07/20/2022    4:45 PM  BP/Weight  Systolic BP 105 132 115 112 122 114 124  Diastolic BP 69 78 65 57 59 69 68  Wt. (Lbs) 285.12 285.2       BMI 43.35 kg/m2 43.36 kg/m2            Morbid obesity  Patient re-educated about  the importance of commitment to a  minimum of 150 minutes of exercise per week as able.  The importance of healthy food choices with portion control discussed, as well as eating regularly and within a 12 hour window most days. The need to choose "clean , green" food 50 to 75% of the time is discussed, as well as to make water the primary drink and set a goal of 64 ounces water daily.       09/14/2022    8:04 AM 07/28/2022    2:52 PM 07/20/2022    2:44 PM  Weight /  BMI  Weight 285 lb 1.9 oz 285 lb 3.2 oz 288 lb  Height 5\' 8"  (1.727 m) 5\' 8"  (1.727 m) 5\' 8"  (1.727 m)  BMI 43.35 kg/m2 43.36 kg/m2 43.79 kg/m2    Unchanged  Hypothyroidism Controlled, no change in medication   Hyperlipidemia Hyperlipidemia:Low fat diet discussed and encouraged.   Lipid Panel  Lab Results  Component Value Date   CHOL 142 09/07/2022   HDL 55 09/07/2022   LDLCALC 64 09/07/2022   TRIG 132 09/07/2022   CHOLHDL 2.6 09/07/2022     Controlled, no change in medication   Back pain with radiculopathy Unchanged, pain managed through pain clinic  Type 2 diabetes mellitus with other specified complication (HCC) Uncontrolled and deteriorated [name] is reminded of the importance of commitment to daily physical activity for 30 minutes or more, as  able and the need to limit carbohydrate intake to 30 to 60 grams per meal to help with blood sugar control.   The need to take medication as prescribed, test blood sugar as directed, and to call between visits if there is a concern that blood sugar is uncontrolled is also discussed.   [name] is reminded of the importance of daily foot exam, annual eye examination, and good blood sugar, blood pressure and cholesterol control.     Latest Ref Rng & Units 09/07/2022    9:17 AM 07/20/2022    3:14 PM 05/12/2022    8:56 AM 01/22/2022    8:51 AM 10/21/2021    8:14 AM  Diabetic Labs  HbA1c 4.8 - 5.6 % 10.5   8.1  7.4  7.2   Micro/Creat Ratio 0 - 29 mg/g creat     <3   Chol 100 - 199 mg/dL 161    096    HDL >04 mg/dL 55    53    Calc LDL 0 - 99 mg/dL 64    66    Triglycerides 0 - 149 mg/dL 540    981    Creatinine 0.57 - 1.00 mg/dL 1.91  4.78  2.95  6.21  0.92       09/14/2022    8:04 AM 07/28/2022    2:52 PM 07/20/2022    5:45 PM 07/20/2022    5:30 PM 07/20/2022    5:15 PM 07/20/2022    5:00 PM 07/20/2022    4:45 PM  BP/Weight  Systolic BP 105 132 115 112 122 114 124  Diastolic BP 69 78 65 57 59 69 68  Wt. (Lbs) 285.12 285.2       BMI 43.35 kg/m2 43.36 kg/m2           10/27/2021    8:20 AM 01/09/2021   10:00 AM  Foot/eye exam completion dates  Foot Form Completion Done Done      Call with numbers weekly, and may consider adding glipizide, intolerant of most meds used in the past

## 2022-09-18 NOTE — Assessment & Plan Note (Signed)
Controlled, no change in medication  

## 2022-09-18 NOTE — Assessment & Plan Note (Signed)
Unchanged, pain managed through pain clinic

## 2022-09-18 NOTE — Assessment & Plan Note (Signed)
Controlled, no change in medication DASH diet and commitment to daily physical activity for a minimum of 30 minutes discussed and encouraged, as a part of hypertension management. The importance of attaining a healthy weight is also discussed.     09/14/2022    8:04 AM 07/28/2022    2:52 PM 07/20/2022    5:45 PM 07/20/2022    5:30 PM 07/20/2022    5:15 PM 07/20/2022    5:00 PM 07/20/2022    4:45 PM  BP/Weight  Systolic BP 105 132 115 112 122 114 124  Diastolic BP 69 78 65 57 59 69 68  Wt. (Lbs) 285.12 285.2       BMI 43.35 kg/m2 43.36 kg/m2

## 2022-10-13 ENCOUNTER — Encounter: Payer: Self-pay | Admitting: Gastroenterology

## 2022-10-13 ENCOUNTER — Ambulatory Visit: Payer: Medicare Other | Admitting: Gastroenterology

## 2022-10-13 ENCOUNTER — Telehealth: Payer: Self-pay | Admitting: Family Medicine

## 2022-10-13 VITALS — BP 136/82 | HR 91 | Ht 68.0 in | Wt 287.0 lb

## 2022-10-13 DIAGNOSIS — Z86010 Personal history of colonic polyps: Secondary | ICD-10-CM | POA: Diagnosis not present

## 2022-10-13 DIAGNOSIS — K5900 Constipation, unspecified: Secondary | ICD-10-CM

## 2022-10-13 DIAGNOSIS — R0789 Other chest pain: Secondary | ICD-10-CM | POA: Diagnosis not present

## 2022-10-13 DIAGNOSIS — K219 Gastro-esophageal reflux disease without esophagitis: Secondary | ICD-10-CM

## 2022-10-13 NOTE — Patient Instructions (Signed)
Please purchase the following medications over the counter and take as directed: lidocaine patches to help with rib pain.  Please follow up with your primary care physician regarding your rib pain.   _______________________________________________________  If your blood pressure at your visit was 140/90 or greater, please contact your primary care physician to follow up on this.  _______________________________________________________  If you are age 67 or older, your body mass index should be between 23-30. Your Body mass index is 43.64 kg/m. If this is out of the aforementioned range listed, please consider follow up with your Primary Care Provider.  If you are age 55 or younger, your body mass index should be between 19-25. Your Body mass index is 43.64 kg/m. If this is out of the aformentioned range listed, please consider follow up with your Primary Care Provider.   ________________________________________________________  The [facility] providers would like to encourage you to use [facility] to communicate with providers for non-urgent requests or questions.  Due to long hold times on the telephone, sending your provider a message by [facility] may be a faster and more efficient way to get a response.  Please allow 48 business hours for a response.  Please remember that this is for non-urgent requests.  _______________________________________________________  Thank you for choosing me and [facility].  Venita Lick. Pleas Koch., MD., Clementeen Graham

## 2022-10-13 NOTE — Progress Notes (Signed)
    Assessment     Right lower chest wall pain, tenderness GERD with a small HH Constipation Personal history of adenomatous colon polyps   Recommendations    Lidocaine patch for 3 days until further advice from PCP on right chest wall pain mgmt Continue Dexilant 60 mg every day and follow antireflux measures Change to Senokot daily as needed Surveillance colonoscopy Nov 2026 GI follow up prn   HPI    This is a 67 year old female with right flank, right chest, RUQ pain.  She relates symptoms have been present for months.  They do not respond to dicyclomine.  Her symptoms are constant.  CTAP performed in June 2024 as below.  EGD performed in June 2023 as below.  She relates constipation and bloating.  She feels MiraLAX leads to nausea so she has discontinued it.  EGD 06/2021 Small HH   Labs / Imaging       Latest Ref Rng & Units 09/07/2022    9:17 AM 07/20/2022    3:14 PM 01/22/2022    8:51 AM  Hepatic Function  Total Protein 6.0 - 8.5 g/dL 6.6  7.2  6.6   Albumin 3.9 - 4.9 g/dL 4.3  3.8  4.3   AST 0 - 40 IU/L 25  32  21   ALT 0 - 32 IU/L 22  27  C 18   Alk Phosphatase 44 - 121 IU/L 78  68  85   Total Bilirubin 0.0 - 1.2 mg/dL 0.4  0.6  0.4     C Corrected result       Latest Ref Rng & Units 07/20/2022    3:14 PM 01/22/2022    8:51 AM 04/18/2021    5:07 PM  CBC  WBC 4.0 - 10.5 K/uL 9.7  9.5  10.8   Hemoglobin 12.0 - 15.0 g/dL 72.5  36.6  44.0   Hematocrit 36.0 - 46.0 % 41.4  41.6  44.8   Platelets 150 - 400 K/uL 306  334  393    CT AP with contrast 06/2022 No acute findings Uterus surgically absent  Gallbladder surgically absent   Current Medications, Allergies, Past Medical History, Past Surgical History, Family History and Social History were reviewed in [employer] record.   Physical Exam: General: Well developed, well nourished, no acute distress Head: Normocephalic and atraumatic Eyes: Sclerae anicteric, EOMI Ears: Normal  auditory acuity Mouth: No deformities or lesions noted Lungs: Clear throughout to auscultation Chest: Marked tenderness over right lower chest wall anteriorly and laterally  Heart: Regular rate and rhythm; No murmurs, rubs or bruits Abdomen: Soft, non tender and non distended. No masses, hepatosplenomegaly or hernias noted. Normal Bowel sounds Rectal: Not done Musculoskeletal: Symmetrical with no gross deformities  Pulses:  Normal pulses noted Extremities: No edema or deformities noted Neurological: Alert oriented x 4, grossly nonfocal Psychological:  Alert and cooperative. Normal mood and affect   Jasan Doughtie T. Russella Dar, MD 10/13/2022, 9:13 AM

## 2022-10-13 NOTE — Telephone Encounter (Signed)
That is still too high if most of the   time fBG is over 150, I recommend adding long acting insulin , lantus/ similar, 7 units daily  Be consistent in checking every day and monitoring foods that she eats and what she is drinking. Let me know if she agrees so I can send in the insulin  She is to continue glipizde 10 mg TWO daily and metformin 100 mg twice daily, confirm that is what she is taking pls

## 2022-10-13 NOTE — Telephone Encounter (Signed)
Patient called in with FYI on blood sugar. This morning reading 188.  Also states that she has a recent visit with GI office and they should be sending over note in regard to possible inflammation on pt wrist. \ patietn wants a call back in regard.

## 2022-10-15 NOTE — Telephone Encounter (Signed)
noted 

## 2022-10-26 ENCOUNTER — Ambulatory Visit: Payer: Medicare Other | Attending: Cardiovascular Disease | Admitting: Cardiovascular Disease

## 2022-10-26 ENCOUNTER — Encounter: Payer: Self-pay | Admitting: Cardiovascular Disease

## 2022-10-26 VITALS — BP 102/82 | HR 84 | Ht 68.0 in | Wt 282.8 lb

## 2022-10-26 DIAGNOSIS — I10 Essential (primary) hypertension: Secondary | ICD-10-CM

## 2022-10-26 NOTE — Patient Instructions (Signed)
Medication Instructions:  No changes *If you need a refill on your cardiac medications before your next appointment, please call your pharmacy*   Lab Work: No labs ordered today If you have labs (blood work) drawn today and your tests are completely normal, you will receive your results only by: MyChart Message (if you have MyChart) OR A paper copy in the mail If you have any lab test that is abnormal or we need to change your treatment, we will call you to review the results.   Testing/Procedures: None   Follow-Up: At [facility], you and your health needs are our priority.  As part of our continuing mission to provide you with exceptional heart care, we have created designated Provider Care Teams.  These Care Teams include your primary Cardiologist (physician) and Advanced Practice Providers (APPs -  Physician Assistants and Nurse Practitioners) who all work together to provide you with the care you need, when you need it.  We recommend signing up for the patient portal called "MyChart".  Sign up information is provided on this After Visit Summary.  MyChart is used to connect with patients for Virtual Visits (Telemedicine).  Patients are able to view lab/test results, encounter notes, upcoming appointments, etc.  Non-urgent messages can be sent to your provider as well.   To learn more about what you can do with MyChart, go to [email or web address].    Your next appointment:   1 year(s)  Provider:   Azalee Course, PA-C        A letter will be mailed to you as a reminder to call the office for your follow up appointment.

## 2022-10-26 NOTE — Progress Notes (Unsigned)
Cardiology Office Note    Date:  10/26/2022   ID:  [name], [name] 1955-12-13, MRN [id number]  PCP:  Kerri Perches, MD  Cardiologist:  Nicki Guadalajara, MD   24 month F/U   History of Present Illness:  [name] is a 67 y.o. female who has a history of hypertension, and type 2 diabetes mellitus in addition to GERD.  She had presented to the office on 11/13/2015 and was seen by Theodore Demark, PA-C, as well as myself.  At that time, she was under significant amount of stress secondary to family issues, and also had experienced some chest pain radiating to the right side of her neck which was not exertionally precipitated. She has a significant family history for CAD and was morbidly obese.  She subsequently underwent an echo Doppler study  on 12/03/2015 which showed an ejection fraction at 50-55% with grade 1 diastolic dysfunction.  There was mitral annular calcification with trivial MR.  She had normal pulmonary pressures.  A nuclear perfusion study was low risk and showed normal perfusion and function.  Laboratory had revealed elevation of her lipid studies with a total cholesterol 204, triglycerides 158, and LDL 131.  She was started on atorvastatin 40 mg.  She has tolerated this well.     When I saw her, she admitted to very poor sleep.  She had frequent awakenings, her sleep was nonrestorative, she noted daytime fatigue, and she snores.  She denied any exertional precipitation of chest pain.  She had occasional panic attacks and her heart rate increases.  I felt she had symptoms highly suggestive of obstructive sleep apnea and recommended that she undergo a sleep study for further evaluation.  Apparently, her insurance company which is [employer] denied her sleep evaluation.     I saw her in May 2018 at which time she continued to sleep poorly.  She was having occasional panic attacks and had noticed her heart rate increasing.  She continued to be under a lot  of stress.  She continued to have nocturia 3 times per night, her sleep is nonrestorative, she snores.     She ultimately underwent a sleep study on August 11, 2016.  This revealed increased upper airway resistance syndrome  (UARS) with an AHI of 3.6/h, although her RDI was increased at 15.1/h.  She had reduced sleep efficiency at 63.4%.  There was mild oxygen desaturation to a nadir of 87%.  There was abnormal sleep architecture with absence of slow-wave sleep, reduction of REM sleep and prolonged latency to REM sleep development.  She did not meet CPAP criteria at that time.   She was evaluated by me in a telemedicine encounter in January 2021.  Over the previous  several years, she has been followed by Dr. Lodema Hong who has been checking her laboratory frequently.  She believe her stress level is significantly improved since her husband left and she is now living by herself.  She gained significant weight with Covid but not exercising but she plans now to start walking daily.  Her sleep has significantly improved with her reduction in stress level.  She denies chest pain.  She denies palpitations.  Recent laboratory from December 07, 2018 showed a total cholesterol 169, LDL cholesterol 96 and triglycerides 209 with HDL 43.    I saw her in September 17, 2019 and since her prior evaluation she continued  to be on benazepril 10 mg, furosemide 20  mg for hypertension and has a prescription for Cardizem 30 mg which he takes as needed with increased heart rate.  She was on levothyroxine 150 mcg for hypothyroidism.  She is diabetic on Metformin.  She continued to be on rosuvastatin 40 mg for hyperlipidemia and Dexilant for GERD.  She believes her stress has significantly improved since she is now separated from her husband.  She was sleeping well and denies any daytime sleepiness.  An Epworth Sleepiness Scale score was calculated in the office  and endorsed at 3.  During that evaluation, her blood pressure was stable.   She was sleeping well.  I last saw her on November 18, 2022. She is continued to be followed by Dr. Syliva Overman for primary care.  Since I last saw her, she has felt well.  She specifically denies any chest pain.  She continues to be on benazepril 10 mg daily and furosemide 20 mg for hypertension and leg swelling.  She is on levothyroxine 150 mcg for hypothyroidism.  She is diabetic on glipizide in addition to metformin.  She has tolerated rosuvastatin 40 mg for hyperlipidemia.  Laboratory on November 11, 2020 showed an LDL cholesterol of 54 with total cholesterol 128, triglycerides 145 and HDL 49.  Glucose was 131.  Hemoglobin A1c was 7.7.   She was seen by Azalee Course, PA on September 07, 2021.    Past Medical History:  Diagnosis Date   Allergy    Anemia    Arthritis    Cataract    forming   COVID-19    Diabetes mellitus without complication (HCC) 2011   GERD (gastroesophageal reflux disease)    History of colon polyps    Hyperlipidemia    Hypertension 2011   Morbid obesity (HCC)    Neuromuscular disorder (HCC)    issues with nerves in back    Osteopenia    Thyroid disease     Past Surgical History:  Procedure Laterality Date   ABDOMINAL HYSTERECTOMY     APPENDECTOMY     CHOLECYSTECTOMY     COLONOSCOPY  2009   [facility] - normal per pt    TONSILLECTOMY      Current Medications: Outpatient Medications Prior to Visit  Medication Sig Dispense Refill   aspirin 81 MG tablet Take 81 mg by mouth daily.     benazepril (LOTENSIN) 5 MG tablet Take 1 tablet (5 mg total) by mouth daily. 30 tablet 3   calcium-vitamin D (OSCAL WITH D) 500-200 MG-UNIT per tablet Take 1 tablet by mouth daily.     cyclobenzaprine (FLEXERIL) 10 MG tablet Take 10 mg by mouth 3 (three) times daily as needed.     Dexlansoprazole 30 MG capsule DR TAKE 1 CAPSULE(30 MG) BY MOUTH TWICE DAILY BEFORE A MEAL 180 capsule 3   dicyclomine (BENTYL) 10 MG capsule Take 1 capsule (10 mg total) by mouth 3 (three) times  daily as needed for spasms. 30 capsule 0   fluticasone (FLONASE) 50 MCG/ACT nasal spray USE 2 SPRAYS IN EACH NOSTRIL ONCE DAILY. SHAKE GENTLY BEFORE USING. 16 g 3   furosemide (LASIX) 20 MG tablet TAKE ONE TABLET BY MOUTH ONCE DAILY FOR LEG SWELLING. 30 tablet 5   gabapentin (NEURONTIN) 300 MG capsule Take 1 capsule (300 mg total) by mouth 2 (two) times daily. 180 capsule 3   glipiZIDE (GLUCOTROL XL) 10 MG 24 hr tablet TAKE TWO (2) TABLETS BY MOUTH ONCE DAILY EVERY MORNING WITH BREAKFAST 180 tablet 3   glucose  blood (ONETOUCH VERIO) test strip USE TO TEST BLOOD SUGAR EVERY DAY 100 strip 5   ibuprofen (ADVIL) 800 MG tablet Take 800 mg by mouth 3 (three) times daily as needed.     Lancets (ONETOUCH DELICA PLUS LANCET33G) MISC USE TO TEST BLOOD SUGAR ONCE DAILY 100 each 5   levothyroxine (SYNTHROID) 150 MCG tablet Take 1 tablet (150 mcg total) by mouth daily. 90 tablet 1   metFORMIN (GLUCOPHAGE) 1000 MG tablet Take 1 tablet (1,000 mg total) by mouth 2 (two) times daily with a meal. 180 tablet 1   montelukast (SINGULAIR) 10 MG tablet Take 1 tablet (10 mg total) by mouth at bedtime. 90 tablet 1   oxyCODONE-acetaminophen (PERCOCET) 10-325 MG tablet Take 1 tablet by mouth 4 (four) times daily as needed for pain.     potassium chloride SA (KLOR-CON M) 20 MEQ tablet TAKE ONE TABLET BY MOUTH EVERY DAY AS NEEDED ON DAYS YOU TAKE FUROSEMIDE FOR SWELLING 30 tablet 5   rosuvastatin (CRESTOR) 40 MG tablet Take 1 tablet (40 mg total) by mouth daily. 90 tablet 3   traZODone (DESYREL) 100 MG tablet Take 1 tablet (100 mg total) by mouth at bedtime. 90 tablet 1   Turmeric 500 MG CAPS Take 1 capsule by mouth daily.     venlafaxine XR (EFFEXOR XR) 37.5 MG 24 hr capsule Take 1 capsule (37.5 mg total) by mouth daily with breakfast. 30 capsule 3   No facility-administered medications prior to visit.     Allergies:   Ozempic (0.25 or 0.5 mg-dose) [semaglutide(0.25 or 0.5mg -dos)], Naproxen sodium, Tsh [thyrotropin], and  Statins   Social History   Socioeconomic History   Marital status: Married    Spouse name: Rowan Pollman   Number of children: 2   Years of education: 12   Highest education level: 12th grade  Occupational History   Occupation: disabled   Tobacco Use   Smoking status: Never    Passive exposure: Never   Smokeless tobacco: Never  Vaping Use   Vaping status: Never Used  Substance and Sexual Activity   Alcohol use: No   Drug use: No   Sexual activity: Not Currently  Other Topics Concern   Not on file  Social History Narrative   Lives with Husband alone    Social Determinants of Health   Financial Resource Strain: Low Risk  (03/27/2021)   Overall Financial Resource Strain (CARDIA)    Difficulty of Paying Living Expenses: Not hard at all  Food Insecurity: No Food Insecurity (05/19/2021)   Hunger Vital Sign    Worried About Running Out of Food in the Last Year: Never true    Ran Out of Food in the Last Year: Never true  Transportation Needs: No Transportation Needs (05/19/2021)   PRAPARE - Administrator, Civil Service (Medical): No    Lack of Transportation (Non-Medical): No  Physical Activity: Insufficiently Active (03/27/2021)   Exercise Vital Sign    Days of Exercise per Week: 4 days    Minutes of Exercise per Session: 30 min  Stress: Stress Concern Present (03/27/2021)   [employer] of Occupational Health - Occupational Stress Questionnaire    Feeling of Stress : Rather much  Social Connections: Moderately Integrated (03/27/2021)   Social Connection and Isolation Panel [NHANES]    Frequency of Communication with Friends and Family: More than three times a week    Frequency of Social Gatherings with Friends and Family: More than three times a week  Attends Religious Services: More than 4 times per year    Active Member of Clubs or Organizations: No    Attends Banker Meetings: Never    Marital Status: Married  Recent Concern: Social  Connections - Moderately Isolated (02/09/2021)   Social Connection and Isolation Panel [NHANES]    Frequency of Communication with Friends and Family: More than three times a week    Frequency of Social Gatherings with Friends and Family: More than three times a week    Attends Religious Services: More than 4 times per year    Active Member of [employer] or Organizations: No    Attends Engineer, structural: Never    Marital Status: Separated     Family History:  The patient's family history includes Asthma in her mother; Cancer in her maternal grandmother, paternal grandfather, sister, and sister; Diabetes in her father; Heart disease (age of onset: 68) in her mother; Heart disease (age of onset: 14) in her father; Hypertension in her father and mother; Pancreatic cancer in her mother; Prostate cancer in her paternal grandmother.   ROS General: Negative; No fevers, chills, or night sweats; morbid obesity HEENT: Negative; No changes in vision or hearing, sinus congestion, difficulty swallowing Pulmonary: Negative; No cough, wheezing, shortness of breath, hemoptysis Cardiovascular: Negative; No chest pain, presyncope, syncope, palpitations GI: Negative; No nausea, vomiting, diarrhea, or abdominal pain GU: Negative; No dysuria, hematuria, or difficulty voiding Musculoskeletal: Negative; no myalgias, joint pain, or weakness Hematologic/Oncology: Negative; no easy bruising, bleeding Endocrine: Positive for diabetes mellitus Neuro: Negative; no changes in balance, headaches Skin: Negative; No rashes or skin lesions Psychiatric: Negative; No behavioral problems, depression Sleep: Mild snoring, no daytime sleepiness, hypersomnolence, bruxism, restless legs, hypnogognic hallucinations, no cataplexy Other comprehensive 14 point system review is negative.   PHYSICAL EXAM:   VS:  BP 102/82   Pulse 84   Ht 5\' 8"  (1.727 m)   Wt 282 lb 12.8 oz (128.3 kg)   SpO2 97%   BMI 43.00 kg/m      Repeat blood pressure by me was 120/82  Wt Readings from Last 3 Encounters:  10/26/22 282 lb 12.8 oz (128.3 kg)  10/13/22 287 lb 0.4 oz (130.2 kg)  09/14/22 285 lb 1.9 oz (129.3 kg)       Physical Exam BP 102/82   Pulse 84   Ht 5\' 8"  (1.727 m)   Wt 282 lb 12.8 oz (128.3 kg)   SpO2 97%   BMI 43.00 kg/m  General: Alert, oriented, no distress.  Skin: normal turgor, no rashes, warm and dry HEENT: Normocephalic, atraumatic. Pupils equal round and reactive to light; sclera anicteric; extraocular muscles intact; Fundi ** Nose without nasal septal hypertrophy Mouth/Parynx benign; Mallinpatti scale Neck: No JVD, no carotid bruits; normal carotid upstroke Lungs: clear to ausculatation and percussion; no wheezing or rales Chest wall: without tenderness to palpitation Heart: PMI not displaced, RRR, s1 s2 normal, 1/6 systolic murmur, no diastolic murmur, no rubs, gallops, thrills, or heaves Abdomen: soft, nontender; no hepatosplenomehaly, BS+; abdominal aorta nontender and not dilated by palpation. Back: no CVA tenderness Pulses 2+ Musculoskeletal: full range of motion, normal strength, no joint deformities Extremities: no clubbing cyanosis or edema, Homan's sign negative  Neurologic: grossly nonfocal; Cranial nerves grossly wnl Psychologic: Normal mood and affect    General: Alert, oriented, no distress.  Skin: normal turgor, no rashes, warm and dry HEENT: Normocephalic, atraumatic. Pupils equal round and reactive to light; sclera anicteric; extraocular muscles intact;  Nose  without nasal septal hypertrophy Mouth/Parynx benign; Mallinpatti scale 3 Neck: No JVD, no carotid bruits; normal carotid upstroke Lungs: clear to ausculatation and percussion; no wheezing or rales Chest wall: without tenderness to palpitation Heart: PMI not displaced, RRR, s1 s2 normal, 1/6 systolic murmur, no diastolic murmur, no rubs, gallops, thrills, or heaves Abdomen: soft, nontender; no  hepatosplenomehaly, BS+; abdominal aorta nontender and not dilated by palpation. Back: no CVA tenderness Pulses 2+ Musculoskeletal: full range of motion, normal strength, no joint deformities Extremities: no clubbing cyanosis or edema, Homan's sign negative  Neurologic: grossly nonfocal; Cranial nerves grossly wnl Psychologic: Normal mood and affect   Studies/Labs Reviewed:    EKG Interpretation Date/Time:  Tuesday October 26 2022 08:35:16 EDT Ventricular Rate:  84 PR Interval:  186 QRS Duration:  74 QT Interval:  364 QTC Calculation: 430 R Axis:   15  Text Interpretation: Normal sinus rhythm Normal ECG When compared with ECG of 18-Apr-2021 17:20, PREVIOUS ECG IS PRESENT Confirmed by Nicki Guadalajara ([id number]) on 10/26/2022 9:04:13 AM    November 17, 2020 ECG (independently read by me): NSR at 75; no ectopy, normal intervals  September 17, 2019 ECG (independently read by me): NSR at 94; no ectopy; normal intervls  Jun 10, 2016 ECG (independently reviewed by me): Normal sinus rhythm at 95 bpm, normal intervals without ST segment changes.  Recent Labs:    Latest Ref Rng & Units 09/07/2022    9:17 AM 07/20/2022    3:14 PM 05/12/2022    8:56 AM  BMP  Glucose 70 - 99 mg/dL 604  540  981   BUN 8 - 27 mg/dL 12  14  13    Creatinine 0.57 - 1.00 mg/dL 1.91  4.78  2.95   BUN/Creat Ratio 12 - 28 12   15    Sodium 134 - 144 mmol/L 138  136  138   Potassium 3.5 - 5.2 mmol/L 4.7  4.0  4.7   Chloride 96 - 106 mmol/L 99  102  99   CO2 20 - 29 mmol/L 24  26  25    Calcium 8.7 - 10.3 mg/dL 62.1  9.4  30.8         Latest Ref Rng & Units 09/07/2022    9:17 AM 07/20/2022    3:14 PM 01/22/2022    8:51 AM  Hepatic Function  Total Protein 6.0 - 8.5 g/dL 6.6  7.2  6.6   Albumin 3.9 - 4.9 g/dL 4.3  3.8  4.3   AST 0 - 40 IU/L 25  32  21   ALT 0 - 32 IU/L 22  27  C 18   Alk Phosphatase 44 - 121 IU/L 78  68  85   Total Bilirubin 0.0 - 1.2 mg/dL 0.4  0.6  0.4     C Corrected result       Latest Ref  Rng & Units 07/20/2022    3:14 PM 01/22/2022    8:51 AM 04/18/2021    5:07 PM  CBC  WBC 4.0 - 10.5 K/uL 9.7  9.5  10.8   Hemoglobin 12.0 - 15.0 g/dL 65.7  84.6  96.2   Hematocrit 36.0 - 46.0 % 41.4  41.6  44.8   Platelets 150 - 400 K/uL 306  334  393    Lab Results  Component Value Date   MCV 92.4 07/20/2022   MCV 91 01/22/2022   MCV 92.2 04/18/2021   Lab Results  Component Value Date   TSH 4.490 05/12/2022  Lab Results  Component Value Date   HGBA1C 10.5 (H) 09/07/2022     BNP No results found for: "BNP"  ProBNP No results found for: "PROBNP"   Lipid Panel     Component Value Date/Time   CHOL 142 09/07/2022 0917   TRIG 132 09/07/2022 0917   HDL 55 09/07/2022 0917   CHOLHDL 2.6 09/07/2022 0917   CHOLHDL 2.6 08/08/2019 1100   VLDL 22 03/19/2016 0933   LDLCALC 64 09/07/2022 0917   LDLCALC 66 08/08/2019 1100   LABVLDL 23 09/07/2022 0917     RADIOLOGY: No results found.   Additional studies/ records that were reviewed today include:    NUCLEAR STUDY: 11/28/2015 Study Highlights   The left ventricular ejection fraction is normal (55-65%). Nuclear stress EF: 55%. There was no ST segment deviation noted during stress. The study is normal. This is a low risk study.      ------------------------------------------------------------------- ECHO Study Conclusions: 12/12/2015   - Left ventricle: The cavity size was normal. Wall thickness was   normal. Systolic function was low normal to mildly reduced. The   estimated ejection fraction was in the range of 50% to 55%. Wall   motion was normal; there were no regional wall motion   abnormalities. Doppler parameters are consistent with abnormal   left ventricular relaxation (grade 1 diastolic dysfunction). - Aortic valve: There was no stenosis. - Mitral valve: Mildly calcified annulus. There was trivial   regurgitation. - Right ventricle: The cavity size was normal. Systolic function   was normal. -  Tricuspid valve: Peak RV-RA gradient (S): 16 mm Hg. - Pulmonary arteries: PA peak pressure: 19 mm Hg (S). - Inferior vena cava: The vessel was normal in size. The   respirophasic diameter changes were in the normal range (>= 50%),   consistent with normal central venous pressure.   Impressions:   - Normal LV size with EF 50-55%, low normal to mildly reduced   systolic function. Normal RV size and systolic function. No   significant valvular abnormalities.     SLEEP STUDY  08/11/2016  IMPRESSIONS Increased upper airway resistance syndrome (UARS) , with an AHI of 3.6 per hour and increased RDI at 15.1/h. No significant central sleep apnea occurred during this study (CAI = 0.0/h). Mild oxygen desaturation to a nadir of 87%. Reduced sleep efficiency at only 63.4%. Abnormal sleep architecture with absence of slow-wave sleep, reduction in in REM sleep, and prolonged latency to in REM sleep The patient snored with Moderate snoring volume. No significant cardiac abnormalities were noted during this study. Moderate periodic limb movements of sleep occurred during the study. No significant associated arousals.   DIAGNOSIS Sleep apnea, unspecified type, G47.30 Periodic limb movement of sleep   RECOMMENDATIONS At present, patient does not meet criteria for CPAP therapy. Efforts should be made to optimize nasal and oral pharyngeal patency. Consider alternatives for the treatment of moderate snoring. If patient is symptomatic with restless legs, consider pharmacotherapy with a PLMS index of 34.11 Avoid alcohol, sedatives and other CNS depressants that may worsen sleep apnea and disrupt normal sleep architecture. Sleep hygiene should be reviewed to assess factors that may improve sleep quality. Weight management and regular exercise should be initiated or continued if appropriate.   [Electronically signed] 09/16/2016 10:50 AM    ASSESSMENT:    1. Essential hypertension    PLAN:  [name] is a 67 year old female who has a history of morbid obesity with a current BMI 45.16,  hypertension,  type 2 diabetes mellitus, GERD, and snoring.  Presently, her blood pressure is stable on her regimen of benazepril 10 mg and furosemide 20 mg daily.  She is not having any leg edema on current therapy.  She continues to sleep well and a prior sleep study suggested increased upper airway resistance syndrome without definitive sleep apnea.  Presently she denies any residual daytime sleepiness and her sleep is restorative.  She is diabetic on glipizide in addition to metformin 5 by Dr. Lodema Hong.  Recent hemoglobin A1c was 7.7 improved from 8.4 in April 2022.  She is tolerating rosuvastatin 40 mg.  Lipid studies were excellent and she is at target with LDL cholesterol at 54.  She has a neuropathy for which she takes gabapentin.  We discussed the importance of weight loss and exercise.  Clinically she is stable.  I will see her in 1 year for reevaluation or sooner as needed.   Her blood pressure today is stable on her current regimen consisting of benazepril 10 mg, furosemide 20 mg daily.  She also has a prescription for diltiazem 30 mg which she had taken in the past on a as needed basis during periods of increased heart rate and panic attacks.  She previously underwent a sleep study which revealed increased upper airway resistance syndrome without definitive sleep apnea.  An Epworth Sleepiness Scale score was calculated in the office today and this endorsed at 3 arguing against any daytime sleepiness.  She believes she is sleeping better.  Her stress load has significantly improved since she is now separated from her husband.  She continues to be on rosuvastatin 40 mg for hyperlipidemia LDL cholesterol on August 08, 2019 was excellent at 63.  She is diabetic on glipizide in addition to Metformin.  Her GERD is controlled with Dexilant.  She continues to be on levothyroxine 150 mcg for hypothyroidism.  TSH  level was 0.69 on August 08, 2019.  She is followed by Dr. Syliva Overman for her primary care.  Cardiac wise she is stable.  I will see her in 1 year for reevaluation or sooner as needed   Medication Adjustments/Labs and Tests Ordered: Current medicines are reviewed at length with the patient today.  Concerns regarding medicines are outlined above.  Medication changes, Labs and Tests ordered today are listed in the Patient Instructions below. There are no Patient Instructions on file for this visit.   Signed, Nicki Guadalajara, MD  10/26/2022 9:04 AM    [facility] [address], [place], [place]  [address] Phone: [phone]

## 2022-10-28 ENCOUNTER — Encounter: Payer: Self-pay | Admitting: Cardiovascular Disease

## 2022-10-28 ENCOUNTER — Other Ambulatory Visit: Payer: Self-pay | Admitting: Family Medicine

## 2022-11-15 ENCOUNTER — Telehealth: Payer: Self-pay | Admitting: Family Medicine

## 2022-11-15 NOTE — Telephone Encounter (Signed)
Patient called in wants a call back.   Wants to know if she is needing lab done before visit on 10/24

## 2022-11-15 NOTE — Telephone Encounter (Signed)
No labs needed

## 2022-11-18 ENCOUNTER — Ambulatory Visit (INDEPENDENT_AMBULATORY_CARE_PROVIDER_SITE_OTHER): Payer: Medicare Other | Admitting: Family Medicine

## 2022-11-18 ENCOUNTER — Encounter: Payer: Self-pay | Admitting: Family Medicine

## 2022-11-18 VITALS — BP 107/71 | HR 101 | Ht 68.0 in | Wt 284.0 lb

## 2022-11-18 DIAGNOSIS — Z23 Encounter for immunization: Secondary | ICD-10-CM | POA: Insufficient documentation

## 2022-11-18 DIAGNOSIS — E785 Hyperlipidemia, unspecified: Secondary | ICD-10-CM | POA: Diagnosis not present

## 2022-11-18 DIAGNOSIS — E039 Hypothyroidism, unspecified: Secondary | ICD-10-CM

## 2022-11-18 DIAGNOSIS — E559 Vitamin D deficiency, unspecified: Secondary | ICD-10-CM

## 2022-11-18 DIAGNOSIS — F418 Other specified anxiety disorders: Secondary | ICD-10-CM

## 2022-11-18 DIAGNOSIS — I10 Essential (primary) hypertension: Secondary | ICD-10-CM | POA: Diagnosis not present

## 2022-11-18 DIAGNOSIS — E1165 Type 2 diabetes mellitus with hyperglycemia: Secondary | ICD-10-CM | POA: Diagnosis not present

## 2022-11-18 MED ORDER — SYNJARDY 12.5-1000 MG PO TABS: 1.0000 | ORAL_TABLET | Freq: Two times a day (BID) | ORAL | 5 refills | Status: DC

## 2022-11-18 MED ORDER — BLOOD PRESSURE KIT
PACK | 0 refills | Status: DC
Start: 2022-11-18 — End: 2023-12-06

## 2022-11-18 NOTE — Assessment & Plan Note (Signed)
After obtaining informed consent, the vaccine is  administered , with no adverse effect noted at the time of administration.  

## 2022-11-18 NOTE — Assessment & Plan Note (Signed)
Controlled, no change in medication Updated lab needed at/ before next visit.  

## 2022-11-18 NOTE — Progress Notes (Signed)
[name]     MRN: [id number]      DOB: 25-May-1955  Chief Complaint  Patient presents with   Follow-up    Cyst on foot    HPI [name] is here for follow up and re-evaluation of chronic medical conditions, medication management and review of any available recent lab and radiology data.  Preventive health is updated, specifically  Cancer screening and Immunization.   Questions or concerns regarding consultations or procedures which the PT has had in the interim are  addressed. The PT denies any adverse reactions to current medications since the last visit.  C/o increased stress in past 1 month FBG documented daily and average 180, despite best efforts at diet change  ROS Denies recent fever or chills. Denies sinus pressure, nasal congestion, ear pain or sore throat. Denies chest congestion, productive cough or wheezing. Denies chest pains, palpitations and leg swelling Denies abdominal pain, nausea, vomiting,diarrhea or constipation.   Denies dysuria, frequency, hesitancy or incontinence. Denies uncontrolled  joint pain, swelling and limitation in mobility. Denies headaches, seizures, numbness, or tingling. C/o , anxiety no interest in medication and therapy Denies skin break down or rash.   PE  BP 107/71 (BP Location: Right Arm, Patient Position: Sitting, Cuff Size: Large)   Pulse (!) 101   Ht 5\' 8"  (1.727 m)   Wt 284 lb 0.6 oz (128.8 kg)   SpO2 97%   BMI 43.19 kg/m   Patient alert and oriented and in no cardiopulmonary distress.  HEENT: No facial asymmetry, EOMI,     Neck supple .  Chest: Clear to auscultation bilaterally.  CVS: S1, S2 no murmurs, no S3.Regular rate.  ABD: Soft non tender.   Ext: No edema  MS: Decreased though Adequate ROM spine, shoulders, hips and knees.  Skin: Intact, no ulcerations or rash noted.  Psych: Good eye contact, normal affect. Memory intact mildly  anxious and  depressed appearing.  CNS: CN 2-12 intact, power,  normal  throughout.no focal deficits noted.   Assessment & Plan  Type 2 diabetes mellitus with hyperglycemia, without long-term current use of insulin (HCC) Uncontrolled, change tpo synjardy, continue glipizide will likely bneed insulin to control blood sugar [name] is reminded of the importance of commitment to daily physical activity for 30 minutes or more, as able and the need to limit carbohydrate intake to 30 to 60 grams per meal to help with blood sugar control.   The need to take medication as prescribed, test blood sugar as directed, and to call between visits if there is a concern that blood sugar is uncontrolled is also discussed.   [name] is reminded of the importance of daily foot exam, annual eye examination, and good blood sugar, blood pressure and cholesterol control.     Latest Ref Rng & Units 09/07/2022    9:17 AM 07/20/2022    3:14 PM 05/12/2022    8:56 AM 01/22/2022    8:51 AM 10/21/2021    8:14 AM  Diabetic Labs  HbA1c 4.8 - 5.6 % 10.5   8.1  7.4  7.2   Micro/Creat Ratio 0 - 29 mg/g creat     <3   Chol 100 - 199 mg/dL 409    811    HDL >91 mg/dL 55    53    Calc LDL 0 - 99 mg/dL 64    66    Triglycerides 0 - 149 mg/dL 478    295    Creatinine  0.57 - 1.00 mg/dL 6.96  2.95  2.84  1.32  0.92       11/18/2022   10:18 AM 10/26/2022    8:33 AM 10/13/2022    8:59 AM 09/14/2022    8:04 AM 07/28/2022    2:52 PM 07/20/2022    5:45 PM 07/20/2022    5:30 PM  BP/Weight  Systolic BP 107 102 136 105 132 115 112  Diastolic BP 71 82 82 69 78 65 57  Wt. (Lbs) 284.04 282.8 287.03 285.12 285.2    BMI 43.19 kg/m2 43 kg/m2 43.64 kg/m2 43.35 kg/m2 43.36 kg/m2        10/27/2021    8:20 AM 01/09/2021   10:00 AM  Foot/eye exam completion dates  Foot Form Completion Done Done      Updated lab needed at/ before next visit.   Vitamin D deficiency Updated lab needed at/ before next visit.   Morbid obesity Unxhnaged  Patient re-educated about  the importance of commitment to  a  minimum of 150 minutes of exercise per week as able.  The importance of healthy food choices with portion control discussed, as well as eating regularly and within a 12 hour window most days. The need to choose "clean , green" food 50 to 75% of the time is discussed, as well as to make water the primary drink and set a goal of 64 ounces water daily.       11/18/2022   10:18 AM 10/26/2022    8:33 AM 10/13/2022    8:59 AM  Weight /BMI  Weight 284 lb 0.6 oz 282 lb 12.8 oz 287 lb 0.4 oz  Height 5\' 8"  (1.727 m) 5\' 8"  (1.727 m) 5\' 8"  (1.727 m)  BMI 43.19 kg/m2 43 kg/m2 43.64 kg/m2      Hypothyroidism Controlled, no change in medication Updated lab needed at/ before next visit.   Encounter for immunization After obtaining informed consent, the vaccine is  administered , with no adverse effect noted at the time of administration.   Depression with anxiety Uncontrolled, not suicidal or homicidal, no interest in medication management or therapy

## 2022-11-18 NOTE — Assessment & Plan Note (Signed)
Updated lab needed at/ before next visit.   

## 2022-11-18 NOTE — Assessment & Plan Note (Signed)
Uncontrolled, not suicidal or homicidal, no interest in medication management or therapy

## 2022-11-18 NOTE — Assessment & Plan Note (Signed)
Unxhnaged  Patient re-educated about  the importance of commitment to a  minimum of 150 minutes of exercise per week as able.  The importance of healthy food choices with portion control discussed, as well as eating regularly and within a 12 hour window most days. The need to choose "clean , green" food 50 to 75% of the time is discussed, as well as to make water the primary drink and set a goal of 64 ounces water daily.       11/18/2022   10:18 AM 10/26/2022    8:33 AM 10/13/2022    8:59 AM  Weight /BMI  Weight 284 lb 0.6 oz 282 lb 12.8 oz 287 lb 0.4 oz  Height 5\' 8"  (1.727 m) 5\' 8"  (1.727 m) 5\' 8"  (1.727 m)  BMI 43.19 kg/m2 43 kg/m2 43.64 kg/m2

## 2022-11-18 NOTE — Patient Instructions (Addendum)
Annual exam first or 2nd week in January, call if you need me sooner  First week in December, HBA1c, chem 7 and eGFR, Vit D urine aCR  Flu vaccine today  Nurse to retrieve eye exam  Nurse pls order large BP cuff for patient  New instead of metformin is synjardy one twice dail, a comb ination of metformin and jardiance at maximum dose Continue glipizide as before  In 2 to 3 weeks, if fasting blood sugar is staying above 150, you need to send me a message you will need to add long acting insulin  Thanks for choosing [facility], we consider it a privelige to serve you.

## 2022-11-18 NOTE — Assessment & Plan Note (Signed)
Uncontrolled, change tpo synjardy, continue glipizide will likely bneed insulin to control blood sugar [name] is reminded of the importance of commitment to daily physical activity for 30 minutes or more, as able and the need to limit carbohydrate intake to 30 to 60 grams per meal to help with blood sugar control.   The need to take medication as prescribed, test blood sugar as directed, and to call between visits if there is a concern that blood sugar is uncontrolled is also discussed.   [name] is reminded of the importance of daily foot exam, annual eye examination, and good blood sugar, blood pressure and cholesterol control.     Latest Ref Rng & Units 09/07/2022    9:17 AM 07/20/2022    3:14 PM 05/12/2022    8:56 AM 01/22/2022    8:51 AM 10/21/2021    8:14 AM  Diabetic Labs  HbA1c 4.8 - 5.6 % 10.5   8.1  7.4  7.2   Micro/Creat Ratio 0 - 29 mg/g creat     <3   Chol 100 - 199 mg/dL 244    010    HDL >27 mg/dL 55    53    Calc LDL 0 - 99 mg/dL 64    66    Triglycerides 0 - 149 mg/dL 253    664    Creatinine 0.57 - 1.00 mg/dL 4.03  4.74  2.59  5.63  0.92       11/18/2022   10:18 AM 10/26/2022    8:33 AM 10/13/2022    8:59 AM 09/14/2022    8:04 AM 07/28/2022    2:52 PM 07/20/2022    5:45 PM 07/20/2022    5:30 PM  BP/Weight  Systolic BP 107 102 136 105 132 115 112  Diastolic BP 71 82 82 69 78 65 57  Wt. (Lbs) 284.04 282.8 287.03 285.12 285.2    BMI 43.19 kg/m2 43 kg/m2 43.64 kg/m2 43.35 kg/m2 43.36 kg/m2        10/27/2021    8:20 AM 01/09/2021   10:00 AM  Foot/eye exam completion dates  Foot Form Completion Done Done      Updated lab needed at/ before next visit.

## 2022-11-22 ENCOUNTER — Other Ambulatory Visit: Payer: Self-pay | Admitting: Family Medicine

## 2022-12-07 DIAGNOSIS — M150 Primary generalized (osteo)arthritis: Secondary | ICD-10-CM | POA: Diagnosis not present

## 2022-12-07 DIAGNOSIS — G894 Chronic pain syndrome: Secondary | ICD-10-CM | POA: Diagnosis not present

## 2022-12-07 DIAGNOSIS — M62838 Other muscle spasm: Secondary | ICD-10-CM | POA: Diagnosis not present

## 2022-12-07 DIAGNOSIS — E1142 Type 2 diabetes mellitus with diabetic polyneuropathy: Secondary | ICD-10-CM | POA: Diagnosis not present

## 2022-12-10 ENCOUNTER — Other Ambulatory Visit: Payer: Self-pay | Admitting: Family Medicine

## 2022-12-10 ENCOUNTER — Telehealth: Payer: Self-pay

## 2022-12-10 NOTE — Telephone Encounter (Signed)
Copied from CRM [id number]. Topic: Clinical - Lab/Test Results >> Dec 10, 2022  9:45 AM [name] wrote: PT was advised to relay blood sugar readings to her doctor- on the 25th of October her blood glucose was 211, PT states that today  15th of November it is 146

## 2022-12-14 ENCOUNTER — Telehealth: Payer: Self-pay | Admitting: Family Medicine

## 2022-12-14 NOTE — Telephone Encounter (Signed)
PATIENT ASSISTANCE  Noted  Copied Sleeved  Original in PCP box Copy front desk folder

## 2022-12-16 NOTE — Telephone Encounter (Signed)
Patient picked up forms.

## 2022-12-16 NOTE — Telephone Encounter (Signed)
Patient called asked for a nurse return her call about her blood work readings.

## 2022-12-16 NOTE — Telephone Encounter (Signed)
Called patient will pick up forms 

## 2022-12-20 ENCOUNTER — Other Ambulatory Visit: Payer: Self-pay | Admitting: Family Medicine

## 2023-01-18 ENCOUNTER — Other Ambulatory Visit: Payer: Self-pay | Admitting: Family Medicine

## 2023-01-21 DIAGNOSIS — E559 Vitamin D deficiency, unspecified: Secondary | ICD-10-CM | POA: Diagnosis not present

## 2023-01-21 DIAGNOSIS — E1165 Type 2 diabetes mellitus with hyperglycemia: Secondary | ICD-10-CM | POA: Diagnosis not present

## 2023-01-23 LAB — BMP8+EGFR
BUN/Creatinine Ratio: 18 (ref 12–28)
BUN: 17 mg/dL (ref 8–27)
CO2: 22 mmol/L (ref 20–29)
Calcium: 9.8 mg/dL (ref 8.7–10.3)
Chloride: 103 mmol/L (ref 96–106)
Creatinine, Ser: 0.96 mg/dL (ref 0.57–1.00)
Glucose: 147 mg/dL — ABNORMAL HIGH (ref 70–99)
Potassium: 4.6 mmol/L (ref 3.5–5.2)
Sodium: 139 mmol/L (ref 134–144)
eGFR: 65 mL/min/{1.73_m2} (ref >59)

## 2023-01-23 LAB — MICROALBUMIN / CREATININE URINE RATIO: Creatinine, Urine: 55.4 mg/dL

## 2023-01-23 LAB — VITAMIN D 25 HYDROXY (VIT D DEFICIENCY, FRACTURES): Vit D, 25-Hydroxy: 37.3 ng/mL (ref 30.0–100.0)

## 2023-01-23 LAB — HEMOGLOBIN A1C
Est. average glucose Bld gHb Est-mCnc: 200 mg/dL
Hgb A1c MFr Bld: 8.6 % — ABNORMAL HIGH (ref 4.8–5.6)

## 2023-01-27 ENCOUNTER — Ambulatory Visit (INDEPENDENT_AMBULATORY_CARE_PROVIDER_SITE_OTHER): Payer: Medicare Other | Admitting: Family Medicine

## 2023-01-27 ENCOUNTER — Encounter: Payer: Self-pay | Admitting: Family Medicine

## 2023-01-27 VITALS — BP 108/78 | HR 95 | Ht 68.0 in | Wt 276.1 lb

## 2023-01-27 DIAGNOSIS — I10 Essential (primary) hypertension: Secondary | ICD-10-CM

## 2023-01-27 DIAGNOSIS — E1165 Type 2 diabetes mellitus with hyperglycemia: Secondary | ICD-10-CM

## 2023-01-27 DIAGNOSIS — E785 Hyperlipidemia, unspecified: Secondary | ICD-10-CM | POA: Diagnosis not present

## 2023-01-27 DIAGNOSIS — Z0001 Encounter for general adult medical examination with abnormal findings: Secondary | ICD-10-CM | POA: Diagnosis not present

## 2023-01-27 MED ORDER — BENAZEPRIL HCL 5 MG PO TABS: 5.0000 mg | ORAL_TABLET | Freq: Every day | ORAL | 3 refills | Status: DC

## 2023-01-27 MED ORDER — ROSUVASTATIN CALCIUM 40 MG PO TABS: 40.0000 mg | ORAL_TABLET | Freq: Every day | ORAL | 1 refills | Status: DC

## 2023-01-27 MED ORDER — TRAZODONE HCL 100 MG PO TABS: 100.0000 mg | ORAL_TABLET | Freq: Every day | ORAL | 3 refills | Status: DC

## 2023-01-27 MED ORDER — SYNJARDY 12.5-1000 MG PO TABS: 1.0000 | ORAL_TABLET | Freq: Two times a day (BID) | ORAL | 3 refills | Status: DC

## 2023-01-27 NOTE — Progress Notes (Signed)
    [name]     MRN: [id number]      DOB: January 22, 1956  Chief Complaint  Patient presents with   Annual Exam    CPE     HPI: Patient is in for annual physical exam. No other health concerns are expressed or addressed at the visit. Recent labs,  are reviewed. Immunization is reviewed , and  updated if needed.   PE: BP 108/78 (BP Location: Right Arm, Patient Position: Sitting, Cuff Size: Large)   Pulse 95   Ht 5' 8 (1.727 m)   Wt 276 lb 1.3 oz (125.2 kg)   SpO2 92%   BMI 41.98 kg/m   Pleasant  female, alert and oriented x 3, in no cardio-pulmonary distress. Afebrile. HEENT No facial trauma or asymetry. Sinuses non tender.  Extra occullar muscles intact.. External ears normal, . Neck: supple, no adenopathy,JVD or thyromegaly.No bruits.  Chest: Clear to ascultation bilaterally.No crackles or wheezes. Non tender to palpation   Cardiovascular system; Heart sounds normal,  S1 and  S2 ,no S3.  No murmurPeripheral pulses normal.  Abdomen: Soft, non tender, no organomegaly or masses.    Musculoskeletal exam: Decreased  ROM of spine, hips ,  and knees. . No muscle wasting or atrophy.   Neurologic: Cranial nerves 2 to 12 intact. Power, tone ,sensation and reflexes normal throughout. No disturbance in gait. No tremor.  Skin: Intact, no ulceration, erythema , scaling or rash noted. Pigmentation normal throughout  Psych; Normal mood and affect. Judgement and concentration normal   Assessment & Plan:  Encounter for Medicare annual examination with abnormal findings Annual exam as documented. Counseling done  re healthy lifestyle involving commitment to 150 minutes exercise per week, heart healthy diet, and attaining healthy weight.The importance of adequate sleep also discussed. Regular seat belt use and home safety, is also discussed. Changes in health habits are decided on by the patient with goals and time frames  set for achieving them. Immunization and  cancer screening needs are specifically addressed at this visit.

## 2023-01-27 NOTE — Patient Instructions (Addendum)
 F/U in 13 weeks , call if you need me sooner  Congrats on improved blood sugar , keep working hard at this as we still need to get HBa1c under 8 and preferably under 7  Fasting blood sugar range is best between 80 to 120  Pls add tSH, lipid panel and hepatic panel to recent lab  Pls get non fasting HBA1C, chem 7 and EGFr 3 to 5 days before next appt   It is important that you exercise regularly at least 30 minutes 5 times a week. If you develop chest pain, have severe difficulty breathing, or feel very tired, stop exercising immediately and seek medical attention  Think about what you will eat, plan ahead. Choose  clean, green, fresh or frozen over canned, processed or packaged foods which are more sugary, salty and fatty. 70 to 75% of food eaten should be vegetables and fruit. Three meals at set times with snacks allowed between meals, but they must be fruit or vegetables. Aim to eat over a 12 hour period , example 7 am to 7 pm, and STOP after  your last meal of the day. Drink water,generally about 64 ounces per day, no other drink is as healthy. Fruit juice is best enjoyed in a healthy way, by EATING the fruit. Thanks for choosing [facility], we consider it a privelige to serve you.

## 2023-01-27 NOTE — Assessment & Plan Note (Signed)

## 2023-01-28 LAB — SPECIMEN STATUS REPORT

## 2023-02-15 ENCOUNTER — Ambulatory Visit (INDEPENDENT_AMBULATORY_CARE_PROVIDER_SITE_OTHER): Payer: Medicare Other

## 2023-02-15 VITALS — Ht 68.0 in | Wt 276.0 lb

## 2023-02-15 DIAGNOSIS — Z1231 Encounter for screening mammogram for malignant neoplasm of breast: Secondary | ICD-10-CM

## 2023-02-15 DIAGNOSIS — Z1211 Encounter for screening for malignant neoplasm of colon: Secondary | ICD-10-CM

## 2023-02-15 DIAGNOSIS — Z0000 Encounter for general adult medical examination without abnormal findings: Secondary | ICD-10-CM | POA: Diagnosis not present

## 2023-02-15 NOTE — Progress Notes (Signed)
 Because this visit was a virtual/telehealth visit,  certain criteria was not obtained, such a blood pressure, CBG if applicable, and timed get up and go. Any medications not marked as "taking" were not mentioned during the medication reconciliation part of the visit. Any vitals not documented were not able to be obtained due to this being a telehealth visit or patient was unable to self-report a recent blood pressure reading due to a lack of equipment at home via telehealth. Vitals that have been documented are verbally provided by the patient.  Interactive audio and video telecommunications were attempted between this provider and patient, however failed, due to patient having technical difficulties OR patient did not have access to video capability.  We continued and completed visit with audio only.  Subjective:   [name] is a 68 y.o. female who presents for Medicare Annual (Subsequent) preventive examination.  Visit Complete: Virtual I connected with  [name] on 02/15/23 by a audio enabled telemedicine application and verified that I am speaking with the correct person using two identifiers.  Patient Location: Home  Provider Location: Home Office  I discussed the limitations of evaluation and management by telemedicine. The patient expressed understanding and agreed to proceed.  Vital Signs: Because this visit was a virtual/telehealth visit, some criteria may be missing or patient reported. Any vitals not documented were not able to be obtained and vitals that have been documented are patient reported.  Patient Medicare AWV questionnaire was completed by the patient on na; I have confirmed that all information answered by patient is correct and no changes since this date.  Cardiac Risk Factors include: advanced age (>18men, >29 women);diabetes mellitus;dyslipidemia;hypertension;obesity (BMI >30kg/m2);sedentary lifestyle     Objective:    Today's Vitals   02/15/23 1403  02/15/23 1404  Weight: 276 lb (125.2 kg)   Height: 5\' 8"  (1.727 m)   PainSc:  5    Body mass index is 41.97 kg/m.     02/15/2023    2:02 PM 07/20/2022    2:45 PM 02/10/2022    2:40 PM 05/19/2021   12:50 PM 04/18/2021    5:00 PM 03/27/2021    1:02 PM 02/09/2021   11:43 AM  Advanced Directives  Does Patient Have a Medical Advance Directive? No Yes No Yes No No No  Type of Air cabin crew of Attorney     Copy of Healthcare Power of Attorney in Chart?    No - copy requested     Would patient like information on creating a medical advance directive? No - Patient declined  Yes (ED - Information included in AVS) No - Patient declined  No - Patient declined Yes (ED - Information included in AVS)    Current Medications (verified) Outpatient Encounter Medications as of 02/15/2023  Medication Sig   aspirin 81 MG tablet Take 81 mg by mouth daily.   benazepril (LOTENSIN) 5 MG tablet Take 1 tablet (5 mg total) by mouth daily.   Blood Pressure KIT Large cuff to monitor blood pressure daily.   calcium-vitamin D (OSCAL WITH D) 500-200 MG-UNIT per tablet Take 1 tablet by mouth daily.   cyclobenzaprine (FLEXERIL) 10 MG tablet Take 10 mg by mouth 3 (three) times daily as needed.   DEXILANT 30 MG capsule DR TAKE ONE CAPSULE BY MOUTH TWICE A DAY   dicyclomine (BENTYL) 10 MG capsule Take 1 capsule (10 mg total) by mouth 3 (three) times daily as needed for  spasms.   Empagliflozin-metFORMIN HCl (SYNJARDY) 12.05-998 MG TABS Take 1 tablet by mouth 2 (two) times daily.   fluticasone (FLONASE) 50 MCG/ACT nasal spray USE 2 SPRAYS IN EACH NOSTRIL ONCE DAILY. SHAKE GENTLY BEFORE USING.   furosemide (LASIX) 20 MG tablet TAKE ONE TABLET BY MOUTH ONCE DAILY FOR LEG SWELLING.   gabapentin (NEURONTIN) 300 MG capsule Take 1 capsule (300 mg total) by mouth 2 (two) times daily.   glipiZIDE (GLUCOTROL XL) 10 MG 24 hr tablet TAKE TWO (2) TABLETS BY MOUTH ONCE DAILY EVERY MORNING  WITH BREAKFAST   glucose blood (ONETOUCH VERIO) test strip USE TO TEST BLOOD SUGAR EVERY DAY   ibuprofen (ADVIL) 800 MG tablet Take 800 mg by mouth 3 (three) times daily as needed.   Lancets (ONETOUCH DELICA PLUS LANCET33G) MISC USE TO TEST BLOOD SUGAR ONCE DAILY   levothyroxine (SYNTHROID) 150 MCG tablet Take half tablet Tuesday , Thursday and Saturday, take one whole Mon, wed, Fri and Saturday  reported as  taking this way for at least 9 months as of 01/2023   montelukast (SINGULAIR) 10 MG tablet Take 1 tablet (10 mg total) by mouth at bedtime.   oxyCODONE-acetaminophen (PERCOCET) 10-325 MG tablet Take 1 tablet by mouth 4 (four) times daily as needed for pain.   potassium chloride SA (KLOR-CON M) 20 MEQ tablet TAKE ONE TABLET BY MOUTH EVERY DAY AS NEEDED ON DAYS YOU TAKE FUROSEMIDE FOR SWELLING   rosuvastatin (CRESTOR) 40 MG tablet Take 1 tablet (40 mg total) by mouth daily.   traZODone (DESYREL) 100 MG tablet Take 1 tablet (100 mg total) by mouth at bedtime.   Turmeric 500 MG CAPS Take 1 capsule by mouth daily.   No facility-administered encounter medications on file as of 02/15/2023.    Allergies (verified) Ozempic (0.25 or 0.5 mg-dose) [semaglutide(0.25 or 0.5mg -dos)], Naproxen sodium, Tsh [thyrotropin], and Statins   History: Past Medical History:  Diagnosis Date   Allergy    Anemia    Arthritis    Cataract    forming   COVID-19    Diabetes mellitus without complication (HCC) 2011   GERD (gastroesophageal reflux disease)    History of colon polyps    Hyperlipidemia    Hypertension 2011   Morbid obesity (HCC)    Neuromuscular disorder (HCC)    issues with nerves in back    Osteopenia    Thyroid disease    Past Surgical History:  Procedure Laterality Date   ABDOMINAL HYSTERECTOMY     APPENDECTOMY     CHOLECYSTECTOMY     COLONOSCOPY  2009   [facility] - normal per pt    TONSILLECTOMY     Family History  Problem Relation Age of Onset   Asthma Mother     Hypertension Mother    Heart disease Mother 24       cabg   Pancreatic cancer Mother    Diabetes Father    Hypertension Father    Heart disease Father 43       cabg   Cancer Sister        breast, uterine   Cancer Sister    Cancer Maternal Grandmother    Prostate cancer Paternal Grandmother    Cancer Paternal Grandfather    Colon polyps Neg Hx    Colon cancer Neg Hx    Esophageal cancer Neg Hx    Rectal cancer Neg Hx    Stomach cancer Neg Hx    Social History   Socioeconomic History  Marital status: Married    Spouse name: Kamira Robbins   Number of children: 2   Years of education: 12   Highest education level: 12th grade  Occupational History   Occupation: disabled   Tobacco Use   Smoking status: Never    Passive exposure: Never   Smokeless tobacco: Never  Vaping Use   Vaping status: Never Used  Substance and Sexual Activity   Alcohol use: No   Drug use: No   Sexual activity: Not Currently  Other Topics Concern   Not on file  Social History Narrative   Lives with Husband alone    Social Drivers of Health   Financial Resource Strain: Low Risk  (02/15/2023)   Overall Financial Resource Strain (CARDIA)    Difficulty of Paying Living Expenses: Not hard at all  Food Insecurity: No Food Insecurity (02/15/2023)   Hunger Vital Sign    Worried About Running Out of Food in the Last Year: Never true    Ran Out of Food in the Last Year: Never true  Transportation Needs: No Transportation Needs (02/15/2023)   PRAPARE - Administrator, Civil Service (Medical): No    Lack of Transportation (Non-Medical): No  Physical Activity: Sufficiently Active (02/15/2023)   Exercise Vital Sign    Days of Exercise per Week: 7 days    Minutes of Exercise per Session: 30 min  Stress: No Stress Concern Present (02/15/2023)   [employer] of Occupational Health - Occupational Stress Questionnaire    Feeling of Stress : Not at all  Social Connections: Moderately Isolated  (02/15/2023)   Social Connection and Isolation Panel [NHANES]    Frequency of Communication with Friends and Family: More than three times a week    Frequency of Social Gatherings with Friends and Family: Twice a week    Attends Religious Services: More than 4 times per year    Active Member of [employer] or Organizations: No    Attends Engineer, structural: Never    Marital Status: Separated    Tobacco Counseling Counseling given: Not Answered   Clinical Intake:  Pre-visit preparation completed: Yes  Pain : 0-10 Pain Score: 5  Pain Type: Chronic pain Pain Location: Back Pain Orientation: Lower Pain Descriptors / Indicators: Constant, Sharp Pain Onset: More than a month ago Pain Frequency: Constant     BMI - recorded: 41.97 Nutritional Status: BMI > 30  Obese Nutritional Risks: None Diabetes: Yes CBG done?: No Did pt. bring in CBG monitor from home?: No  How often do you need to have someone help you when you read instructions, pamphlets, or other written materials from your doctor or pharmacy?: 1 - Never  Interpreter Needed?: No  Information entered by ::  Zain Bingman CMA   Activities of Daily Living    02/15/2023    2:12 PM  In your present state of health, do you have any difficulty performing the following activities:  Hearing? 0  Vision? 0  Difficulty concentrating or making decisions? 0  Walking or climbing stairs? 1  Comment due to chronic low back pain  Dressing or bathing? 0  Doing errands, shopping? 0  Preparing Food and eating ? N  Using the Toilet? N  In the past six months, have you accidently leaked urine? N  Do you have problems with loss of bowel control? N  Managing your Medications? N  Managing your Finances? N  Housekeeping or managing your Housekeeping? N    Patient Care Team: Lodema Hong,  Milus Mallick, MD as PCP - General Tresa Endo Clovis Pu, MD as PCP - Cardiology (Cardiology) Gavin Pound, [facility] (Inactive) (Pharmacist) Loann Quill as Consulting Physician (Ophthalmology)  Indicate any recent Medical Services you may have received from other than [facility] providers in the past year (date may be approximate).     Assessment:   This is a routine wellness examination for [name].  Hearing/Vision screen Hearing Screening - Comments:: Patient denies any hearing difficulties.   Vision Screening - Comments:: Wears rx glasses - up to date with routine eye exams  Sees Dr. Loann Quill   Goals Addressed             This Visit's Progress    Patient Stated       To get better control over my diabetes and eat better        Depression Screen    02/15/2023    2:13 PM 01/27/2023    9:41 AM 11/18/2022   10:48 AM 11/18/2022   10:19 AM 09/14/2022    8:06 AM 07/28/2022    2:52 PM 07/20/2022    1:10 PM  PHQ 2/9 Scores  PHQ - 2 Score 1 0 6 0 0 2 0  PHQ- 9 Score 2    0 6 6    Fall Risk    02/15/2023    2:12 PM 01/27/2023    9:40 AM 11/18/2022   10:19 AM 09/14/2022    8:06 AM 07/28/2022    2:52 PM  Fall Risk   Falls in the past year? 0 0 0 0 0  Number falls in past yr: 0 0 0 0 0  Injury with Fall? 0 0 0 0 0  Risk for fall due to : No Fall Risks No Fall Risks No Fall Risks No Fall Risks   Follow up Falls prevention discussed Falls evaluation completed Falls evaluation completed Falls evaluation completed     MEDICARE RISK AT HOME: Medicare Risk at Home Any stairs in or around the home?: Yes If so, are there any without handrails?: No Home free of loose throw rugs in walkways, pet beds, electrical cords, etc?: Yes Adequate lighting in your home to reduce risk of falls?: Yes Life alert?: No Use of a cane, walker or w/c?: Yes (uses cane when her back is out) Grab bars in the bathroom?: Yes Shower chair or bench in shower?: No Elevated toilet seat or a handicapped toilet?: Yes  TIMED UP AND GO:  Was the test performed?  No    Cognitive Function:        02/15/2023    2:11 PM 02/10/2022    2:45 PM  02/09/2021   11:37 AM 12/24/2019    8:15 AM 12/18/2018    9:09 AM  6CIT Screen  What Year? 0 points 0 points 0 points 0 points 0 points  What month? 0 points 0 points 0 points 0 points 0 points  What time? 0 points 0 points 0 points 0 points 0 points  Count back from 20 0 points 0 points 0 points 0 points 0 points  Months in reverse 0 points 0 points 0 points 0 points 0 points  Repeat phrase 0 points 0 points 0 points 0 points 0 points  Total Score 0 points 0 points 0 points 0 points 0 points    Immunizations Immunization History  Administered Date(s) Administered   Fluad Quad(high Dose 65+) 11/18/2020, 10/27/2021   Fluad Trivalent(High Dose 65+) 11/18/2022   Influenza Whole 12/03/2008,  10/19/2010   Influenza,inj,Quad PF,6+ Mos 12/27/2012, 11/01/2013, 10/25/2014, 11/10/2015, 11/24/2016, 12/08/2017, 12/13/2018, 09/25/2019   PNEUMOCOCCAL CONJUGATE-20 11/18/2020   Pneumococcal Conjugate-13 04/11/2014   Pneumococcal Polysaccharide-23 03/05/2013   Td 12/03/2008   Tdap 12/13/2018   Zoster Recombinant(Shingrix) 07/21/2017, 01/05/2018    TDAP status: Up to date  Flu Vaccine status: Up to date  Pneumococcal vaccine status: Up to date  Covid-19 vaccine status: Completed vaccines  Qualifies for Shingles Vaccine? No   Zostavax completed No   Shingrix Completed?: Yes  Screening Tests Health Maintenance  Topic Date Due   Colonoscopy  12/20/2024 (Originally 12/20/2022)   HEMOGLOBIN A1C  07/22/2023   OPHTHALMOLOGY EXAM  08/09/2023   FOOT EXAM  11/18/2023   Diabetic kidney evaluation - eGFR measurement  01/21/2024   Diabetic kidney evaluation - Urine ACR  01/21/2024   Medicare Annual Wellness (AWV)  01/27/2024   MAMMOGRAM  07/19/2024   DTaP/Tdap/Td (3 - Td or Tdap) 12/12/2028   Pneumonia Vaccine 25+ Years old  Completed   INFLUENZA VACCINE  Completed   DEXA SCAN  Completed   Hepatitis C Screening  Completed   Zoster Vaccines- Shingrix  Completed   HPV VACCINES  Aged Out    COVID-19 Vaccine  Discontinued    Health Maintenance  There are no preventive care reminders to display for this patient.  Colorectal cancer screening: Referral to GI placed 02/15/2023. Pt aware the office will call re: appt.  Mammogram status: Completed 07/20/2022. Repeat every year  Bone Density status: Ordered 02/15/2023. Pt provided with contact info and advised to call to schedule appt.  Lung Cancer Screening: (Low Dose CT Chest recommended if Age 31-80 years, 20 pack-year currently smoking OR have quit w/in 15years.) does not qualify.   Lung Cancer Screening Referral: na  Additional Screening:  Hepatitis C Screening: does not qualify; Completed   Vision Screening: Recommended annual ophthalmology exams for early detection of glaucoma and other disorders of the eye. Is the patient up to date with their annual eye exam?  No  Who is the provider or what is the name of the office in which the patient attends annual eye exams? Dr. Loann Quill If pt is not established with a provider, would they like to be referred to a provider to establish care? No .   Dental Screening: Recommended annual dental exams for proper oral hygiene  Diabetic Foot Exam: Diabetic Foot Exam: Completed 11/18/2022  Community Resource Referral / Chronic Care Management: CRR required this visit?  No   CCM required this visit?  No     Plan:     I have personally reviewed and noted the following in the patient's chart:   Medical and social history Use of alcohol, tobacco or illicit drugs  Current medications and supplements including opioid prescriptions. Patient is currently taking opioid prescriptions. Information provided to patient regarding non-opioid alternatives. Patient advised to discuss non-opioid treatment plan with their provider. Functional ability and status Nutritional status Physical activity Advanced directives List of other physicians Hospitalizations, surgeries, and ER visits  in previous 12 months Vitals Screenings to include cognitive, depression, and falls Referrals and appointments  In addition, I have reviewed and discussed with patient certain preventive protocols, quality metrics, and best practice recommendations. A written personalized care plan for preventive services as well as general preventive health recommendations were provided to patient.     Jordan Hawks Brazen Domangue, CMA   02/15/2023   After Visit Summary: (Mail) Due to this being a telephonic visit, the after  visit summary with patients personalized plan was offered to patient via mail   Nurse Notes: see routing comment

## 2023-02-15 NOTE — Patient Instructions (Signed)
[name] , Thank you for taking time to come for your Medicare Wellness Visit. I appreciate your ongoing commitment to your health goals. Please review the following plan we discussed and let me know if I can assist you in the future.   Referrals/Orders/Follow-Ups/Clinician Recommendations:   Next Medicare Annual Wellness Visit:February 21, 2024 at 9:20 am telephone visit  You have an order for:  []   2D Mammogram  []   3D Mammogram  [x]   Bone Density   []   Lung Cancer Screening  Please call for appointment:   [facility] at [facility] [address] [place], [place] [address] [phone]  Make sure to wear two-piece clothing.  No lotions powders or deodorants the day of the appointment Make sure to bring picture ID and insurance card.  Bring list of medications you are currently taking including any supplements.   Schedule your [place] screening mammogram through MyChart!   Log into your MyChart account.  Go to 'Visit' (or 'Appointments' if on mobile App) --> Schedule an Appointment  Under 'Select a Reason for Visit' choose the Mammogram Screening option.  Complete the pre-visit questions and select the time and place that best fits your schedule.  [facility] [address] [place] [address] [place],  [place]  [address] Main: [phone]    This is a list of the screening recommended for you and due dates:  Health Maintenance  Topic Date Due   DEXA scan (bone density measurement)  12/12/2022   Colon Cancer Screening  12/20/2024*   Mammogram  07/20/2023   Hemoglobin A1C  07/22/2023   Eye exam for diabetics  08/09/2023   Complete foot exam   11/18/2023   Yearly kidney function blood test for diabetes  01/21/2024   Yearly kidney health urinalysis for diabetes  01/21/2024   Medicare Annual Wellness Visit  02/15/2024   DTaP/Tdap/Td vaccine (3 - Td or Tdap) 12/12/2028   Pneumonia Vaccine  Completed   Flu Shot  Completed    Hepatitis C Screening  Completed   Zoster (Shingles) Vaccine  Completed   HPV Vaccine  Aged Out   COVID-19 Vaccine  Discontinued  *Topic was postponed. The date shown is not the original due date.    Advanced directives: (Declined) Advance directive discussed with you today. Even though you declined this today, please call our office should you change your mind, and we can give you the proper paperwork for you to fill out.  Next Medicare Annual Wellness Visit scheduled for next year: yes  Preventive Care 28 Years and Older, Female Preventive care refers to lifestyle choices and visits with your health care provider that can promote health and wellness. Preventive care visits are also called wellness exams. What can I expect for my preventive care visit? Counseling Your health care provider may ask you questions about your: Medical history, including: Past medical problems. Family medical history. Pregnancy and menstrual history. History of falls. Current health, including: Memory and ability to understand (cognition). Emotional well-being. Home life and relationship well-being. Sexual activity and sexual health. Lifestyle, including: Alcohol, nicotine or tobacco, and drug use. Access to firearms. Diet, exercise, and sleep habits. Work and work Astronomer. Sunscreen use. Safety issues such as seatbelt and bike helmet use. Physical exam Your health care provider will check your: Height and weight. These may be used to calculate your BMI (body mass index). BMI is a measurement that tells if you are at a healthy weight. Waist circumference. This  measures the distance around your waistline. This measurement also tells if you are at a healthy weight and may help predict your risk of certain diseases, such as type 2 diabetes and high blood pressure. Heart rate and blood pressure. Body temperature. Skin for abnormal spots. What immunizations do I need?  Vaccines are usually given  at various ages, according to a schedule. Your health care provider will recommend vaccines for you based on your age, medical history, and lifestyle or other factors, such as travel or where you work. What tests do I need? Screening Your health care provider may recommend screening tests for certain conditions. This may include: Lipid and cholesterol levels. Hepatitis C test. Hepatitis B test. HIV (human immunodeficiency virus) test. STI (sexually transmitted infection) testing, if you are at risk. Lung cancer screening. Colorectal cancer screening. Diabetes screening. This is done by checking your blood sugar (glucose) after you have not eaten for a while (fasting). Mammogram. Talk with your health care provider about how often you should have regular mammograms. BRCA-related cancer screening. This may be done if you have a family history of breast, ovarian, tubal, or peritoneal cancers. Bone density scan. This is done to screen for osteoporosis. Talk with your health care provider about your test results, treatment options, and if necessary, the need for more tests. Follow these instructions at home: Eating and drinking  Eat a diet that includes fresh fruits and vegetables, whole grains, lean protein, and low-fat dairy products. Limit your intake of foods with high amounts of sugar, saturated fats, and salt. Take vitamin and mineral supplements as recommended by your health care provider. Do not drink alcohol if your health care provider tells you not to drink. If you drink alcohol: Limit how much you have to 0-1 drink a day. Know how much alcohol is in your drink. In the [place] one drink equals one 12 oz bottle of beer (355 mL), one 5 oz glass of wine (148 mL), or one 1 oz glass of hard liquor (44 mL). Lifestyle Brush your teeth every morning and night with fluoride toothpaste. Floss one time each day. Exercise for at least 30 minutes 5 or more days each week. Do not use any products  that contain nicotine or tobacco. These products include cigarettes, chewing tobacco, and vaping devices, such as e-cigarettes. If you need help quitting, ask your health care provider. Do not use drugs. If you are sexually active, practice safe sex. Use a condom or other form of protection in order to prevent STIs. Take aspirin only as told by your health care provider. Make sure that you understand how much to take and what form to take. Work with your health care provider to find out whether it is safe and beneficial for you to take aspirin daily. Ask your health care provider if you need to take a cholesterol-lowering medicine (statin). Find healthy ways to manage stress, such as: Meditation, yoga, or listening to music. Journaling. Talking to a trusted person. Spending time with friends and family. Minimize exposure to UV radiation to reduce your risk of skin cancer. Safety Always wear your seat belt while driving or riding in a vehicle. Do not drive: If you have been drinking alcohol. Do not ride with someone who has been drinking. When you are tired or distracted. While texting. If you have been using any mind-altering substances or drugs. Wear a helmet and other protective equipment during sports activities. If you have firearms in your house, make sure you follow  all gun safety procedures. What's next? Visit your health care provider once a year for an annual wellness visit. Ask your health care provider how often you should have your eyes and teeth checked. Stay up to date on all vaccines. This information is not intended to replace advice given to you by your health care provider. Make sure you discuss any questions you have with your health care provider. Document Revised: 07/09/2020 Document Reviewed: 07/09/2020 Elsevier Patient Education  2024 [employer]. Understanding Your Risk for Falls Millions of people have serious injuries from falls each year. It is important to  understand your risk of falling. Talk with your health care provider about your risk and what you can do to lower it. If you do have a serious fall, make sure to tell your provider. Falling once raises your risk of falling again. How can falls affect me? Serious injuries from falls are common. These include: Broken bones, such as hip fractures. Head injuries, such as traumatic brain injuries (TBI) or concussions. A fear of falling can cause you to avoid activities and stay at home. This can make your muscles weaker and raise your risk for a fall. What can increase my risk? There are a number of risk factors that increase your risk for falling. The more risk factors you have, the higher your risk of falling. Serious injuries from a fall happen most often to people who are older than 68 years old. Teenagers and young adults ages 4-29 are also at higher risk. Common risk factors include: Weakness in the lower body. Being generally weak or confused due to long-term (chronic) illness. Dizziness or balance problems. Poor vision. Medicines that cause dizziness or drowsiness. These may include: Medicines for your blood pressure, heart, anxiety, insomnia, or swelling (edema). Pain medicines. Muscle relaxants. Other risk factors include: Drinking alcohol. Having had a fall in the past. Having foot pain or wearing improper footwear. Working at a dangerous job. Having any of the following in your home: Tripping hazards, such as floor clutter or loose rugs. Poor lighting. Pets. Having dementia or memory loss. What actions can I take to lower my risk of falling?     Physical activity Stay physically fit. Do strength and balance exercises. Consider taking a regular class to build strength and balance. Yoga and tai chi are good options. Vision Have your eyes checked every year and your prescription for glasses or contacts updated as needed. Shoes and walking aids Wear non-skid shoes. Wear  shoes that have rubber soles and low heels. Do not wear high heels. Do not walk around the house in socks or slippers. Use a cane or walker as told by your provider. Home safety Attach secure railings on both sides of your stairs. Install grab bars for your bathtub, shower, and toilet. Use a non-skid mat in your bathtub or shower. Attach bath mats securely with double-sided, non-slip rug tape. Use good lighting in all rooms. Keep a flashlight near your bed. Make sure there is a clear path from your bed to the bathroom. Use night-lights. Do not use throw rugs. Make sure all carpeting is taped or tacked down securely. Remove all clutter from walkways and stairways, including extension cords. Repair uneven or broken steps and floors. Avoid walking on icy or slippery surfaces. Walk on the grass instead of on icy or slick sidewalks. Use ice melter to get rid of ice on walkways in the winter. Use a cordless phone. Questions to ask your health care provider Can  you help me check my risk for a fall? Do any of my medicines make me more likely to fall? Should I take a vitamin D supplement? What exercises can I do to improve my strength and balance? Should I make an appointment to have my vision checked? Do I need a bone density test to check for weak bones (osteoporosis)? Would it help to use a cane or a walker? Where to find more information Centers for Disease Control and Prevention, STEADI: TonerPromos.no Community-Based Fall Prevention Programs: TonerPromos.no [employer] on Aging: BaseRingTones.pl Contact a health care provider if: You fall at home. You are afraid of falling at home. You feel weak, drowsy, or dizzy. This information is not intended to replace advice given to you by your health care provider. Make sure you discuss any questions you have with your health care provider. Document Revised: 09/14/2021 Document Reviewed: 09/14/2021 Elsevier Patient Education  2024 Elsevier  Inc.   Managing Pain Without Opioids Opioids are strong medicines used to treat moderate to severe pain. For some people, especially those who have long-term (chronic) pain, opioids may not be the best choice for pain management due to: Side effects like nausea, constipation, and sleepiness. The risk of addiction (opioid use disorder). The longer you take opioids, the greater your risk of addiction. Pain that lasts for more than 3 months is called chronic pain. Managing chronic pain usually requires more than one approach and is often provided by a team of health care providers working together (multidisciplinary approach). Pain management may be done at a pain management center or pain clinic. How to manage pain without the use of opioids Use non-opioid medicines Non-opioid medicines for pain may include: Over-the-counter or prescription non-steroidal anti-inflammatory drugs (NSAIDs). These may be the first medicines used for pain. They work well for muscle and bone pain, and they reduce swelling. Acetaminophen. This over-the-counter medicine may work well for milder pain but not swelling. Antidepressants. These may be used to treat chronic pain. A certain type of antidepressant (tricyclics) is often used. These medicines are given in lower doses for pain than when used for depression. Anticonvulsants. These are usually used to treat seizures but may also reduce nerve (neuropathic) pain. Muscle relaxants. These relieve pain caused by sudden muscle tightening (spasms). You may also use a pain medicine that is applied to the skin as a patch, cream, or gel (topical analgesic), such as a numbing medicine. These may cause fewer side effects than medicines taken by mouth. Do certain therapies as directed Some therapies can help with pain management. They include: Physical therapy. You will do exercises to gain strength and flexibility. A physical therapist may teach you exercises to move and stretch  parts of your body that are weak, stiff, or painful. You can learn these exercises at physical therapy visits and practice them at home. Physical therapy may also involve: Massage. Heat wraps or applying heat or cold to affected areas. Electrical signals that interrupt pain signals (transcutaneous electrical nerve stimulation, TENS). Weak lasers that reduce pain and swelling (low-level laser therapy). Signals from your body that help you learn to regulate pain (biofeedback). Occupational therapy. This helps you to learn ways to function at home and work with less pain. Recreational therapy. This involves trying new activities or hobbies, such as a physical activity or drawing. Mental health therapy, including: Cognitive behavioral therapy (CBT). This helps you learn coping skills for dealing with pain. Acceptance and commitment therapy (ACT) to change the way you think and  react to pain. Relaxation therapies, including muscle relaxation exercises and mindfulness-based stress reduction. Pain management counseling. This may be individual, family, or group counseling.  Receive medical treatments Medical treatments for pain management include: Nerve block injections. These may include a pain blocker and anti-inflammatory medicines. You may have injections: Near the spine to relieve chronic back or neck pain. Into joints to relieve back or joint pain. Into nerve areas that supply a painful area to relieve body pain. Into muscles (trigger point injections) to relieve some painful muscle conditions. A medical device placed near your spine to help block pain signals and relieve nerve pain or chronic back pain (spinal cord stimulation device). Acupuncture. Follow these instructions at home Medicines Take over-the-counter and prescription medicines only as told by your health care provider. If you are taking pain medicine, ask your health care providers about possible side effects to watch out  for. Do not drive or use heavy machinery while taking prescription opioid pain medicine. Lifestyle  Do not use drugs or alcohol to reduce pain. If you drink alcohol, limit how much you have to: 0-1 drink a day for women who are not pregnant. 0-2 drinks a day for men. Know how much alcohol is in a drink. In the [place] one drink equals one 12 oz bottle of beer (355 mL), one 5 oz glass of wine (148 mL), or one 1 oz glass of hard liquor (44 mL). Do not use any products that contain nicotine or tobacco. These products include cigarettes, chewing tobacco, and vaping devices, such as e-cigarettes. If you need help quitting, ask your health care provider. Eat a healthy diet and maintain a healthy weight. Poor diet and excess weight may make pain worse. Eat foods that are high in fiber. These include fresh fruits and vegetables, whole grains, and beans. Limit foods that are high in fat and processed sugars, such as fried and sweet foods. Exercise regularly. Exercise lowers stress and may help relieve pain. Ask your health care provider what activities and exercises are safe for you. If your health care provider approves, join an exercise class that combines movement and stress reduction. Examples include yoga and tai chi. Get enough sleep. Lack of sleep may make pain worse. Lower stress as much as possible. Practice stress reduction techniques as told by your therapist. General instructions Work with all your pain management providers to find the treatments that work best for you. You are an important member of your pain management team. There are many things you can do to reduce pain on your own. Consider joining an online or in-person support group for people who have chronic pain. Keep all follow-up visits. This is important. Where to find more information You can find more information about managing pain without opioids from: American Academy of Pain Medicine: painmed.org Institute for Chronic  Pain: instituteforchronicpain.org American Chronic Pain Association: theacpa.org Contact a health care provider if: You have side effects from pain medicine. Your pain gets worse or does not get better with treatments or home therapy. You are struggling with anxiety or depression. Summary Many types of pain can be managed without opioids. Chronic pain may respond better to pain management without opioids. Pain is best managed when you and a team of health care providers work together. Pain management without opioids may include non-opioid medicines, medical treatments, physical therapy, mental health therapy, and lifestyle changes. Tell your health care providers if your pain gets worse or is not being managed well enough. This information is  not intended to replace advice given to you by your health care provider. Make sure you discuss any questions you have with your health care provider. Document Revised: 04/23/2020 Document Reviewed: 04/23/2020 Elsevier Patient Education  2024 [employer].

## 2023-03-07 ENCOUNTER — Telehealth: Payer: Self-pay | Admitting: Gastroenterology

## 2023-03-07 NOTE — Telephone Encounter (Signed)
 Patient denies any GI concerns. She is not having any rectal bleeding and no blood noted in her stools. She saidher recall was adjusted due to changed guidelines. Her PCP does not agrees with it. Patient asks to do her colonoscopy sooner.

## 2023-03-07 NOTE — Telephone Encounter (Signed)
 Inbound call from [facility] patient concerning colonoscopy. She has a recall for 2026 and wants to know if she can do it early. She would like for Dr. Nandigam to continue her care. Please advise.

## 2023-03-08 DIAGNOSIS — E1142 Type 2 diabetes mellitus with diabetic polyneuropathy: Secondary | ICD-10-CM | POA: Diagnosis not present

## 2023-03-08 DIAGNOSIS — M62838 Other muscle spasm: Secondary | ICD-10-CM | POA: Diagnosis not present

## 2023-03-08 DIAGNOSIS — M150 Primary generalized (osteo)arthritis: Secondary | ICD-10-CM | POA: Diagnosis not present

## 2023-03-08 DIAGNOSIS — G894 Chronic pain syndrome: Secondary | ICD-10-CM | POA: Diagnosis not present

## 2023-03-09 NOTE — Telephone Encounter (Signed)
The recommendation by Dr. Russella Dar was based on recent guidelines from GI society, we can consider doing an exam earlier if needed if she is having any concerning symptoms

## 2023-03-10 NOTE — Telephone Encounter (Signed)
Patient is advised.

## 2023-04-25 DIAGNOSIS — E039 Hypothyroidism, unspecified: Secondary | ICD-10-CM | POA: Diagnosis not present

## 2023-04-25 DIAGNOSIS — E785 Hyperlipidemia, unspecified: Secondary | ICD-10-CM | POA: Diagnosis not present

## 2023-04-25 DIAGNOSIS — E1165 Type 2 diabetes mellitus with hyperglycemia: Secondary | ICD-10-CM | POA: Diagnosis not present

## 2023-04-25 DIAGNOSIS — E1169 Type 2 diabetes mellitus with other specified complication: Secondary | ICD-10-CM | POA: Diagnosis not present

## 2023-04-26 LAB — BMP8+EGFR
BUN/Creatinine Ratio: 17 (ref 12–28)
BUN: 17 mg/dL (ref 8–27)
CO2: 20 mmol/L (ref 20–29)
Calcium: 9.8 mg/dL (ref 8.7–10.3)
Chloride: 102 mmol/L (ref 96–106)
Creatinine, Ser: 1.02 mg/dL — ABNORMAL HIGH (ref 0.57–1.00)
Glucose: 137 mg/dL — ABNORMAL HIGH (ref 70–99)
Potassium: 4.6 mmol/L (ref 3.5–5.2)
Sodium: 141 mmol/L (ref 134–144)
eGFR: 60 mL/min/{1.73_m2} (ref >59)

## 2023-04-26 LAB — HEMOGLOBIN A1C
Est. average glucose Bld gHb Est-mCnc: 186 mg/dL
Hgb A1c MFr Bld: 8.1 % — ABNORMAL HIGH (ref 4.8–5.6)

## 2023-04-29 ENCOUNTER — Other Ambulatory Visit: Payer: Self-pay

## 2023-04-29 ENCOUNTER — Ambulatory Visit (INDEPENDENT_AMBULATORY_CARE_PROVIDER_SITE_OTHER): Payer: Medicare Other | Admitting: Family Medicine

## 2023-04-29 VITALS — BP 125/80 | HR 97 | Resp 16 | Ht 68.0 in | Wt 280.1 lb

## 2023-04-29 DIAGNOSIS — I10 Essential (primary) hypertension: Secondary | ICD-10-CM

## 2023-04-29 DIAGNOSIS — E785 Hyperlipidemia, unspecified: Secondary | ICD-10-CM | POA: Diagnosis not present

## 2023-04-29 DIAGNOSIS — E1169 Type 2 diabetes mellitus with other specified complication: Secondary | ICD-10-CM | POA: Diagnosis not present

## 2023-04-29 DIAGNOSIS — Z1231 Encounter for screening mammogram for malignant neoplasm of breast: Secondary | ICD-10-CM

## 2023-04-29 DIAGNOSIS — F321 Major depressive disorder, single episode, moderate: Secondary | ICD-10-CM

## 2023-04-29 DIAGNOSIS — Z780 Asymptomatic menopausal state: Secondary | ICD-10-CM

## 2023-04-29 DIAGNOSIS — E1165 Type 2 diabetes mellitus with hyperglycemia: Secondary | ICD-10-CM

## 2023-04-29 DIAGNOSIS — E039 Hypothyroidism, unspecified: Secondary | ICD-10-CM

## 2023-04-29 MED ORDER — NEOMYCIN-POLYMYXIN-HC 3.5-10000-1 OT SUSP: 3.0000 [drp] | Freq: Three times a day (TID) | OTIC | 0 refills | Status: DC

## 2023-04-29 MED ORDER — MONTELUKAST SODIUM 10 MG PO TABS: 10.0000 mg | ORAL_TABLET | Freq: Every day | ORAL | 1 refills | Status: DC

## 2023-04-29 NOTE — Patient Instructions (Addendum)
 F/U in 13 weeks, call if you need me sooner  Nurse pls call in and add on lipid, hepatic, and TSH to lab drawn earlier this week  Please schedule mammogram and [facility] at [facility] Monday or Tuesday preferred at checkout  Blood sugar has improved, keep this going  No coke, juice. LOTS of veges, some fruit , a lot of water  Aim to keep FBG  under 130  Medication  sent for right ear   It is important that you exercise regularly at least 30 minutes 5 times a week. If you develop chest pain, have severe difficulty breathing, or feel very tired, stop exercising immediately and seek medical attention   Non fasting labs, CBC, chem 7 and EGFr and hBA1C  3 to 5 days bfore next appointment  Thanks for choosing [facility], we consider it a privelige to serve you.

## 2023-04-29 NOTE — Assessment & Plan Note (Signed)
 Diabetes associated with hypertension, hyperlipidemia, and obesity  [name] is reminded of the importance of commitment to daily physical activity for 30 minutes or more, as able and the need to limit carbohydrate intake to 30 to 60 grams per meal to help with blood sugar control.   The need to take medication as prescribed, test blood sugar as directed, and to call between visits if there is a concern that blood sugar is uncontrolled is also discussed.   [name] is reminded of the importance of daily foot exam, annual eye examination, and good blood sugar, blood pressure and cholesterol control.     Latest Ref Rng & Units 04/25/2023    8:23 AM 01/21/2023    8:31 AM 09/07/2022    9:17 AM 07/20/2022    3:14 PM 05/12/2022    8:56 AM  Diabetic Labs  HbA1c 4.8 - 5.6 % 8.1  8.6  10.5   8.1   Micro/Creat Ratio 0 - 29 mg/g creat  <5      Chol 100 - 199 mg/dL   478     HDL >29 mg/dL   55     Calc LDL 0 - 99 mg/dL   64     Triglycerides 0 - 149 mg/dL   562     Creatinine 1.30 - 1.00 mg/dL 8.65  7.84  6.96  2.95  0.89       04/29/2023    8:05 AM 02/15/2023    2:03 PM 01/27/2023    9:40 AM 11/18/2022   10:18 AM 10/26/2022    8:33 AM 10/13/2022    8:59 AM 09/14/2022    8:04 AM  BP/Weight  Systolic BP 125 -- 108 107 102 136 105  Diastolic BP 80 -- 78 71 82 82 69  Wt. (Lbs) 280.12 276 276.08 284.04 282.8 287.03 285.12  BMI 42.59 kg/m2 41.97 kg/m2 41.98 kg/m2 43.19 kg/m2 43 kg/m2 43.64 kg/m2 43.35 kg/m2      Latest Ref Rng & Units 08/09/2022   12:00 AM 10/27/2021    8:20 AM  Foot/eye exam completion dates  Eye Exam No Retinopathy No Retinopathy       Foot Form Completion   Done     This result is from an external source.

## 2023-04-30 ENCOUNTER — Encounter: Payer: Self-pay | Admitting: Family Medicine

## 2023-04-30 NOTE — Assessment & Plan Note (Signed)
 Controlled, no change in medication

## 2023-04-30 NOTE — Assessment & Plan Note (Signed)
 Diabetes associated with hypertension, hyperlipidemia, obesity, arthritis, and depression  Ms. Jarecki is reminded of the importance of commitment to daily physical activity for 30 minutes or more, as able and the need to limit carbohydrate intake to 30 to 60 grams per meal to help with blood sugar control.   The need to take medication as prescribed, test blood sugar as directed, and to call between visits if there is a concern that blood sugar is uncontrolled is also discussed.   Ms. Tolliver is reminded of the importance of daily foot exam, annual eye examination, and good blood sugar, blood pressure and cholesterol control.     Latest Ref Rng & Units 04/25/2023    8:23 AM 01/21/2023    8:31 AM 09/07/2022    9:17 AM 07/20/2022    3:14 PM 05/12/2022    8:56 AM  Diabetic Labs  HbA1c 4.8 - 5.6 % 8.1  8.6  10.5   8.1   Micro/Creat Ratio 0 - 29 mg/g creat  <5      Chol 100 - 199 mg/dL   621     HDL >30 mg/dL   55     Calc LDL 0 - 99 mg/dL   64     Triglycerides 0 - 149 mg/dL   865     Creatinine 7.84 - 1.00 mg/dL 6.96  2.95  2.84  1.32  0.89       04/29/2023    8:05 AM 02/15/2023    2:03 PM 01/27/2023    9:40 AM 11/18/2022   10:18 AM 10/26/2022    8:33 AM 10/13/2022    8:59 AM 09/14/2022    8:04 AM  BP/Weight  Systolic BP 125 -- 108 107 102 136 105  Diastolic BP 80 -- 78 71 82 82 69  Wt. (Lbs) 280.12 276 276.08 284.04 282.8 287.03 285.12  BMI 42.59 kg/m2 41.97 kg/m2 41.98 kg/m2 43.19 kg/m2 43 kg/m2 43.64 kg/m2 43.35 kg/m2      Latest Ref Rng & Units 08/09/2022   12:00 AM 10/27/2021    8:20 AM  Foot/eye exam completion dates  Eye Exam No Retinopathy No Retinopathy       Foot Form Completion   Done     This result is from an external source.

## 2023-04-30 NOTE — Assessment & Plan Note (Signed)
 Updated lab needed at/ before next visit.

## 2023-04-30 NOTE — Assessment & Plan Note (Signed)
  Patient re-educated about  the importance of commitment to a  minimum of 150 minutes of exercise per week as able.  The importance of healthy food choices with portion control discussed, as well as eating regularly and within a 12 hour window most days. The need to choose "clean , green" food 50 to 75% of the time is discussed, as well as to make water the primary drink and set a goal of 64 ounces water daily.       04/29/2023    8:05 AM 02/15/2023    2:03 PM 01/27/2023    9:40 AM  Weight /BMI  Weight 280 lb 1.9 oz 276 lb 276 lb 1.3 oz  Height 5\' 8"  (1.727 m) 5\' 8"  (1.727 m) 5\' 8"  (1.727 m)  BMI 42.59 kg/m2 41.97 kg/m2 41.98 kg/m2    deteriorated

## 2023-04-30 NOTE — Assessment & Plan Note (Signed)
 Hyperlipidemia:Low fat diet discussed and encouraged.   Lipid Panel  Lab Results  Component Value Date   CHOL 142 09/07/2022   HDL 55 09/07/2022   LDLCALC 64 09/07/2022   TRIG 132 09/07/2022   CHOLHDL 2.6 09/07/2022     Updated lab needed at/ before next visit.

## 2023-04-30 NOTE — Assessment & Plan Note (Signed)
 Controlled, no change in medication DASH diet and commitment to daily physical activity for a minimum of 30 minutes discussed and encouraged, as a part of hypertension management. The importance of attaining a healthy weight is also discussed.     04/29/2023    8:05 AM 02/15/2023    2:03 PM 01/27/2023    9:40 AM 11/18/2022   10:18 AM 10/26/2022    8:33 AM 10/13/2022    8:59 AM 09/14/2022    8:04 AM  BP/Weight  Systolic BP 125 -- 108 107 102 136 105  Diastolic BP 80 -- 78 71 82 82 69  Wt. (Lbs) 280.12 276 276.08 284.04 282.8 287.03 285.12  BMI 42.59 kg/m2 41.97 kg/m2 41.98 kg/m2 43.19 kg/m2 43 kg/m2 43.64 kg/m2 43.35 kg/m2

## 2023-04-30 NOTE — Progress Notes (Signed)
 [name]     MRN: [id number]      DOB: 06/05/1955  Chief Complaint  Patient presents with   Diabetes    Follow up     HPI [name] is here for follow up and re-evaluation of chronic medical conditions, medication management and review of any available recent lab and radiology data.  Preventive health is updated, specifically  Cancer screening and Immunization.   Questions or concerns regarding consultations or procedures which the PT has had in the interim are  addressed. The PT denies any adverse reactions to current medications since the last visit.  There are no new concerns.  There are no specific complaints   ROS Denies recent fever or chills. Denies sinus pressure, nasal congestion, ear pain or sore throat. Denies chest congestion, productive cough or wheezing. Denies chest pains, palpitations and leg swelling Denies abdominal pain, nausea, vomiting,diarrhea or constipation.   Denies dysuria, frequency, hesitancy or incontinence. Denies uncontrolled  joint pain, swelling and limitation in mobility. Denies headaches, seizures, numbness, or tingling. Denies depression, anxiety or insomnia. Denies skin break down or rash.   PE  BP 125/80   Pulse 97   Resp 16   Ht 5\' 8"  (1.727 m)   Wt 280 lb 1.9 oz (127.1 kg)   SpO2 93%   BMI 42.59 kg/m   Patient alert and oriented and in no cardiopulmonary distress.  HEENT: No facial asymmetry, EOMI,     Neck supple .  Chest: Clear to auscultation bilaterally.  CVS: S1, S2 no murmurs, no S3.Regular rate.  ABD: Soft non tender.   Ext: No edema  MS: decreased  ROM spine,adequate in  shoulders, hips and knees.  Skin: Intact, no ulcerations or rash noted.  Psych: Good eye contact, normal affect. Memory intact not anxious or depressed appearing.  CNS: CN 2-12 intact, power,  normal throughout.no focal deficits noted.   Assessment & Plan  Type 2 diabetes mellitus with other specified complication (HCC) Diabetes  associated with hypertension, hyperlipidemia, and obesity  [name] is reminded of the importance of commitment to daily physical activity for 30 minutes or more, as able and the need to limit carbohydrate intake to 30 to 60 grams per meal to help with blood sugar control.   The need to take medication as prescribed, test blood sugar as directed, and to call between visits if there is a concern that blood sugar is uncontrolled is also discussed.   [name] is reminded of the importance of daily foot exam, annual eye examination, and good blood sugar, blood pressure and cholesterol control.     Latest Ref Rng & Units 04/25/2023    8:23 AM 01/21/2023    8:31 AM 09/07/2022    9:17 AM 07/20/2022    3:14 PM 05/12/2022    8:56 AM  Diabetic Labs  HbA1c 4.8 - 5.6 % 8.1  8.6  10.5   8.1   Micro/Creat Ratio 0 - 29 mg/g creat  <5      Chol 100 - 199 mg/dL   409     HDL >81 mg/dL   55     Calc LDL 0 - 99 mg/dL   64     Triglycerides 0 - 149 mg/dL   191     Creatinine 4.78 - 1.00 mg/dL 2.95  6.21  3.08  6.57  0.89       04/29/2023    8:05 AM 02/15/2023    2:03 PM 01/27/2023  9:40 AM 11/18/2022   10:18 AM 10/26/2022    8:33 AM 10/13/2022    8:59 AM 09/14/2022    8:04 AM  BP/Weight  Systolic BP 125 -- 108 107 102 136 105  Diastolic BP 80 -- 78 71 82 82 69  Wt. (Lbs) 280.12 276 276.08 284.04 282.8 287.03 285.12  BMI 42.59 kg/m2 41.97 kg/m2 41.98 kg/m2 43.19 kg/m2 43 kg/m2 43.64 kg/m2 43.35 kg/m2      Latest Ref Rng & Units 08/09/2022   12:00 AM 10/27/2021    8:20 AM  Foot/eye exam completion dates  Eye Exam No Retinopathy No Retinopathy       Foot Form Completion   Done     This result is from an external source.        Hypertension goal BP (blood pressure) < 130/80 Controlled, no change in medication DASH diet and commitment to daily physical activity for a minimum of 30 minutes discussed and encouraged, as a part of hypertension management. The importance of attaining a healthy  weight is also discussed.     04/29/2023    8:05 AM 02/15/2023    2:03 PM 01/27/2023    9:40 AM 11/18/2022   10:18 AM 10/26/2022    8:33 AM 10/13/2022    8:59 AM 09/14/2022    8:04 AM  BP/Weight  Systolic BP 125 -- 108 107 102 136 105  Diastolic BP 80 -- 78 71 82 82 69  Wt. (Lbs) 280.12 276 276.08 284.04 282.8 287.03 285.12  BMI 42.59 kg/m2 41.97 kg/m2 41.98 kg/m2 43.19 kg/m2 43 kg/m2 43.64 kg/m2 43.35 kg/m2       Hyperlipidemia Hyperlipidemia:Low fat diet discussed and encouraged.   Lipid Panel  Lab Results  Component Value Date   CHOL 142 09/07/2022   HDL 55 09/07/2022   LDLCALC 64 09/07/2022   TRIG 132 09/07/2022   CHOLHDL 2.6 09/07/2022     Updated lab needed at/ before next visit.   Hypothyroidism Updated lab needed at/ before next visit.   Depression, major, single episode, moderate (HCC) Controlled, no change in medication   Morbid obesity  Patient re-educated about  the importance of commitment to a  minimum of 150 minutes of exercise per week as able.  The importance of healthy food choices with portion control discussed, as well as eating regularly and within a 12 hour window most days. The need to choose "clean , green" food 50 to 75% of the time is discussed, as well as to make water the primary drink and set a goal of 64 ounces water daily.       04/29/2023    8:05 AM 02/15/2023    2:03 PM 01/27/2023    9:40 AM  Weight /BMI  Weight 280 lb 1.9 oz 276 lb 276 lb 1.3 oz  Height 5\' 8"  (1.727 m) 5\' 8"  (1.727 m) 5\' 8"  (1.727 m)  BMI 42.59 kg/m2 41.97 kg/m2 41.98 kg/m2    deteriorated  Type 2 diabetes mellitus with hyperglycemia, without long-term current use of insulin (HCC) Diabetes associated with hypertension, hyperlipidemia, obesity, arthritis, and depression  [name] is reminded of the importance of commitment to daily physical activity for 30 minutes or more, as able and the need to limit carbohydrate intake to 30 to 60 grams per meal to help  with blood sugar control.   The need to take medication as prescribed, test blood sugar as directed, and to call between visits if there is a concern that blood sugar is  uncontrolled is also discussed.   [name] is reminded of the importance of daily foot exam, annual eye examination, and good blood sugar, blood pressure and cholesterol control.     Latest Ref Rng & Units 04/25/2023    8:23 AM 01/21/2023    8:31 AM 09/07/2022    9:17 AM 07/20/2022    3:14 PM 05/12/2022    8:56 AM  Diabetic Labs  HbA1c 4.8 - 5.6 % 8.1  8.6  10.5   8.1   Micro/Creat Ratio 0 - 29 mg/g creat  <5      Chol 100 - 199 mg/dL   045     HDL >40 mg/dL   55     Calc LDL 0 - 99 mg/dL   64     Triglycerides 0 - 149 mg/dL   981     Creatinine 1.91 - 1.00 mg/dL 4.78  2.95  6.21  3.08  0.89       04/29/2023    8:05 AM 02/15/2023    2:03 PM 01/27/2023    9:40 AM 11/18/2022   10:18 AM 10/26/2022    8:33 AM 10/13/2022    8:59 AM 09/14/2022    8:04 AM  BP/Weight  Systolic BP 125 -- 108 107 102 136 105  Diastolic BP 80 -- 78 71 82 82 69  Wt. (Lbs) 280.12 276 276.08 284.04 282.8 287.03 285.12  BMI 42.59 kg/m2 41.97 kg/m2 41.98 kg/m2 43.19 kg/m2 43 kg/m2 43.64 kg/m2 43.35 kg/m2      Latest Ref Rng & Units 08/09/2022   12:00 AM 10/27/2021    8:20 AM  Foot/eye exam completion dates  Eye Exam No Retinopathy No Retinopathy       Foot Form Completion   Done     This result is from an external source.

## 2023-05-01 LAB — LIPID PANEL

## 2023-05-02 ENCOUNTER — Telehealth: Payer: Self-pay | Admitting: Family Medicine

## 2023-05-02 LAB — LIPID PANEL
Cholesterol, Total: 138 mg/dL (ref 100–199)
HDL: 53 mg/dL (ref >39)
LDL CALC COMMENT:: 2.6 ratio (ref 0.0–4.4)
LDL Chol Calc (NIH): 56 mg/dL (ref 0–99)
Triglycerides: 174 mg/dL — ABNORMAL HIGH (ref 0–149)
VLDL Cholesterol Cal: 29 mg/dL (ref 5–40)

## 2023-05-02 LAB — HEPATIC FUNCTION PANEL
ALT: 17 IU/L (ref 0–32)
AST: 25 IU/L (ref 0–40)
Albumin: 4.4 g/dL (ref 3.9–4.9)
Alkaline Phosphatase: 71 IU/L (ref 44–121)
Total Protein: 6.7 g/dL (ref 6.0–8.5)

## 2023-05-02 LAB — TSH: TSH: 3.86 u[IU]/mL (ref 0.450–4.500)

## 2023-05-02 LAB — SPECIMEN STATUS REPORT

## 2023-05-02 NOTE — Telephone Encounter (Signed)
 done

## 2023-05-02 NOTE — Telephone Encounter (Signed)
 Copied from CRM [id number]. Topic: General - Other >> May 02, 2023 10:07 AM Shelah Lewandowsky wrote: Reason for CRM: returning call from Abby in the office- please call [phone]

## 2023-05-04 DIAGNOSIS — M47816 Spondylosis without myelopathy or radiculopathy, lumbar region: Secondary | ICD-10-CM | POA: Diagnosis not present

## 2023-05-04 DIAGNOSIS — G5711 Meralgia paresthetica, right lower limb: Secondary | ICD-10-CM | POA: Diagnosis not present

## 2023-05-04 DIAGNOSIS — M47812 Spondylosis without myelopathy or radiculopathy, cervical region: Secondary | ICD-10-CM | POA: Diagnosis not present

## 2023-05-04 DIAGNOSIS — Z79891 Long term (current) use of opiate analgesic: Secondary | ICD-10-CM | POA: Diagnosis not present

## 2023-05-04 DIAGNOSIS — M5416 Radiculopathy, lumbar region: Secondary | ICD-10-CM | POA: Diagnosis not present

## 2023-05-04 DIAGNOSIS — E1142 Type 2 diabetes mellitus with diabetic polyneuropathy: Secondary | ICD-10-CM | POA: Diagnosis not present

## 2023-05-09 DIAGNOSIS — G894 Chronic pain syndrome: Secondary | ICD-10-CM | POA: Diagnosis not present

## 2023-05-09 DIAGNOSIS — Z79891 Long term (current) use of opiate analgesic: Secondary | ICD-10-CM | POA: Diagnosis not present

## 2023-06-18 ENCOUNTER — Other Ambulatory Visit: Payer: Self-pay | Admitting: Family Medicine

## 2023-07-18 DIAGNOSIS — F3341 Major depressive disorder, recurrent, in partial remission: Secondary | ICD-10-CM | POA: Diagnosis not present

## 2023-07-18 DIAGNOSIS — I10 Essential (primary) hypertension: Secondary | ICD-10-CM | POA: Diagnosis not present

## 2023-07-24 LAB — COMPREHENSIVE METABOLIC PANEL WITH GFR: eGFR: 90.001

## 2023-07-24 LAB — LAB REPORT - SCANNED
Creatinine, POC: 0.67 mg/dL
EGFR: 90

## 2023-07-24 LAB — BASIC METABOLIC PANEL WITH GFR: Creatinine: 0.7 (ref 0.5–1.1)

## 2023-07-25 ENCOUNTER — Ambulatory Visit (HOSPITAL_COMMUNITY)
Admission: RE | Admit: 2023-07-25 | Discharge: 2023-07-25 | Disposition: A | Discharge status: Home / Self Care | Source: Ambulatory Visit | Attending: Family Medicine | Admitting: Family Medicine

## 2023-07-25 DIAGNOSIS — Z780 Asymptomatic menopausal state: Secondary | ICD-10-CM | POA: Diagnosis not present

## 2023-07-25 DIAGNOSIS — E785 Hyperlipidemia, unspecified: Secondary | ICD-10-CM | POA: Diagnosis not present

## 2023-07-25 DIAGNOSIS — Z1231 Encounter for screening mammogram for malignant neoplasm of breast: Secondary | ICD-10-CM | POA: Insufficient documentation

## 2023-07-25 DIAGNOSIS — E1169 Type 2 diabetes mellitus with other specified complication: Secondary | ICD-10-CM | POA: Diagnosis not present

## 2023-07-25 DIAGNOSIS — M85851 Other specified disorders of bone density and structure, right thigh: Secondary | ICD-10-CM | POA: Diagnosis not present

## 2023-07-25 DIAGNOSIS — M85852 Other specified disorders of bone density and structure, left thigh: Secondary | ICD-10-CM | POA: Diagnosis not present

## 2023-07-25 DIAGNOSIS — E039 Hypothyroidism, unspecified: Secondary | ICD-10-CM | POA: Diagnosis not present

## 2023-07-26 ENCOUNTER — Ambulatory Visit: Payer: Self-pay | Admitting: Family Medicine

## 2023-07-26 LAB — CBC
Hematocrit: 42.8 % (ref 34.0–46.6)
Hemoglobin: 13.5 g/dL (ref 11.1–15.9)
MCH: 29.7 pg (ref 26.6–33.0)
MCHC: 31.5 g/dL (ref 31.5–35.7)
MCV: 94 fL (ref 79–97)
Platelets: 339 10*3/uL (ref 150–450)
RBC: 4.54 x10E6/uL (ref 3.77–5.28)
RDW: 13.5 % (ref 11.7–15.4)
WBC: 9 10*3/uL (ref 3.4–10.8)

## 2023-07-26 LAB — HEPATIC FUNCTION PANEL
ALT: 22 IU/L (ref 0–32)
AST: 29 IU/L (ref 0–40)
Albumin: 4.2 g/dL (ref 3.9–4.9)
Alkaline Phosphatase: 71 IU/L (ref 44–121)
Bilirubin Total: 0.4 mg/dL (ref 0.0–1.2)
Bilirubin, Direct: 0.16 mg/dL (ref 0.00–0.40)
Total Protein: 6.7 g/dL (ref 6.0–8.5)

## 2023-07-26 LAB — HEMOGLOBIN A1C
Est. average glucose Bld gHb Est-mCnc: 186 mg/dL
Hgb A1c MFr Bld: 8.1 % — ABNORMAL HIGH (ref 4.8–5.6)

## 2023-07-26 LAB — BMP8+EGFR
BUN/Creatinine Ratio: 15 (ref 12–28)
BUN: 14 mg/dL (ref 8–27)
CO2: 19 mmol/L — ABNORMAL LOW (ref 20–29)
Calcium: 9.9 mg/dL (ref 8.7–10.3)
Chloride: 102 mmol/L (ref 96–106)
Creatinine, Ser: 0.92 mg/dL (ref 0.57–1.00)
Glucose: 142 mg/dL — ABNORMAL HIGH (ref 70–99)
Potassium: 4.4 mmol/L (ref 3.5–5.2)
Sodium: 139 mmol/L (ref 134–144)
eGFR: 68 mL/min/{1.73_m2} (ref >59)

## 2023-07-26 LAB — LIPID PANEL
Chol/HDL Ratio: 2.7 ratio (ref 0.0–4.4)
Cholesterol, Total: 137 mg/dL (ref 100–199)
HDL: 51 mg/dL (ref >39)
LDL Chol Calc (NIH): 62 mg/dL (ref 0–99)
Triglycerides: 138 mg/dL (ref 0–149)
VLDL Cholesterol Cal: 24 mg/dL (ref 5–40)

## 2023-07-26 LAB — TSH: TSH: 3.52 u[IU]/mL (ref 0.450–4.500)

## 2023-07-30 ENCOUNTER — Other Ambulatory Visit: Payer: Self-pay | Admitting: Family Medicine

## 2023-07-30 DIAGNOSIS — E1165 Type 2 diabetes mellitus with hyperglycemia: Secondary | ICD-10-CM

## 2023-08-01 DIAGNOSIS — H52222 Regular astigmatism, left eye: Secondary | ICD-10-CM | POA: Diagnosis not present

## 2023-08-01 LAB — HM DIABETES EYE EXAM

## 2023-08-02 ENCOUNTER — Ambulatory Visit (INDEPENDENT_AMBULATORY_CARE_PROVIDER_SITE_OTHER): Payer: Self-pay | Admitting: Family Medicine

## 2023-08-02 ENCOUNTER — Encounter: Payer: Self-pay | Admitting: Family Medicine

## 2023-08-02 VITALS — BP 109/70 | HR 88 | Resp 16 | Ht 68.0 in | Wt 273.1 lb

## 2023-08-02 DIAGNOSIS — M8588 Other specified disorders of bone density and structure, other site: Secondary | ICD-10-CM

## 2023-08-02 DIAGNOSIS — E7849 Other hyperlipidemia: Secondary | ICD-10-CM

## 2023-08-02 DIAGNOSIS — E1169 Type 2 diabetes mellitus with other specified complication: Secondary | ICD-10-CM | POA: Diagnosis not present

## 2023-08-02 DIAGNOSIS — M25561 Pain in right knee: Secondary | ICD-10-CM

## 2023-08-02 DIAGNOSIS — I10 Essential (primary) hypertension: Secondary | ICD-10-CM

## 2023-08-02 DIAGNOSIS — M8580 Other specified disorders of bone density and structure, unspecified site: Secondary | ICD-10-CM | POA: Insufficient documentation

## 2023-08-02 DIAGNOSIS — E038 Other specified hypothyroidism: Secondary | ICD-10-CM

## 2023-08-02 DIAGNOSIS — E559 Vitamin D deficiency, unspecified: Secondary | ICD-10-CM | POA: Diagnosis not present

## 2023-08-02 DIAGNOSIS — R1011 Right upper quadrant pain: Secondary | ICD-10-CM

## 2023-08-02 DIAGNOSIS — G8929 Other chronic pain: Secondary | ICD-10-CM

## 2023-08-02 MED ORDER — PREDNISONE 10 MG PO TABS: 10.0000 mg | ORAL_TABLET | Freq: Two times a day (BID) | ORAL | 0 refills | Status: DC

## 2023-08-02 MED ORDER — OYSTER SHELL CALCIUM/D3 500-5 MG-MCG PO TABS: 1.0000 | ORAL_TABLET | Freq: Two times a day (BID) | ORAL | 3 refills | Status: AC

## 2023-08-02 NOTE — Assessment & Plan Note (Signed)
 Increaseed and uncontrolled, PREDNISONE  X 5 DAYS, 20 MG DAILY

## 2023-08-02 NOTE — Assessment & Plan Note (Signed)
 Updated lab needed at/ before next visit.

## 2023-08-02 NOTE — Assessment & Plan Note (Signed)
 Needs GI evaluation, recurrent episode  in past year

## 2023-08-02 NOTE — Patient Instructions (Addendum)
 Follow-up in 4 months, call if you need me before.  5 day course of prednisone  is prescribed for joint pain  Please eliminate sodas and sweet drinks from your diet so that your blood sugar will improve and try to commit to 30 minutes 5 days/week of physical activity for health.  Labs are otherwise excellent, also B P is at goal, keep up the good work, and continue to handle stress best way as able  HAPPY 68 IN 8 DAYS, AND MANY MORE!  Nonfasting HbA1c BMP and EGFR and vitamin D  level to be drawn 3 to 5 days before your next visit.  You are referred to GI due to 1 week history of right upper quadrant sharp pain.  Thanks for choosing [facility], we consider it a privelige to serve you.

## 2023-08-02 NOTE — Assessment & Plan Note (Signed)
 Hyperlipidemia:Low fat diet discussed and encouraged.   Lipid Panel  Lab Results  Component Value Date   CHOL 137 07/25/2023   HDL 51 07/25/2023   LDLCALC 62 07/25/2023   TRIG 138 07/25/2023   CHOLHDL 2.7 07/25/2023     Controlled, no change in medication

## 2023-08-02 NOTE — Assessment & Plan Note (Signed)
 Controlled, no change in medication DASH diet and commitment to daily physical activity for a minimum of 30 minutes discussed and encouraged, as a part of hypertension management. The importance of attaining a healthy weight is also discussed.     08/02/2023    9:13 AM 04/29/2023    8:05 AM 02/15/2023    2:03 PM 01/27/2023    9:40 AM 11/18/2022   10:18 AM 10/26/2022    8:33 AM 10/13/2022    8:59 AM  BP/Weight  Systolic BP 109 125 -- 108 107 102 136  Diastolic BP 70 80 -- 78 71 82 82  Wt. (Lbs) 273.08 280.12 276 276.08 284.04 282.8 287.03  BMI 41.52 kg/m2 42.59 kg/m2 41.97 kg/m2 41.98 kg/m2 43.19 kg/m2 43 kg/m2 43.64 kg/m2

## 2023-08-02 NOTE — Assessment & Plan Note (Addendum)
 [name]

## 2023-08-02 NOTE — Assessment & Plan Note (Signed)
 Diabetes associated with hypertension, hyperlipidemia, obesity, and arthritis  Ms. Wheeless is reminded of the importance of commitment to daily physical activity for 30 minutes or more, as able and the need to limit carbohydrate intake to 30 to 60 grams per meal to help with blood sugar control.   The need to take medication as prescribed, test blood sugar as directed, and to call between visits if there is a concern that blood sugar is uncontrolled is also discussed.   Ms. Hilbun is reminded of the importance of daily foot exam, annual eye examination, and good blood sugar, blood pressure and cholesterol control.     Latest Ref Rng & Units 07/25/2023    8:51 AM 07/25/2023    8:50 AM 04/25/2023    8:23 AM 01/21/2023    8:31 AM 09/07/2022    9:17 AM  Diabetic Labs  HbA1c 4.8 - 5.6 % 8.1   8.1  8.6  10.5   Micro/Creat Ratio 0 - 29 mg/g creat    <5    Chol 100 - 199 mg/dL  862  861   857   HDL >60 mg/dL  51  53   55   Calc LDL 0 - 99 mg/dL  62  56   64   Triglycerides 0 - 149 mg/dL  861  825   867   Creatinine 0.57 - 1.00 mg/dL 9.07   8.97  9.03  9.01       08/02/2023    9:13 AM 04/29/2023    8:05 AM 02/15/2023    2:03 PM 01/27/2023    9:40 AM 11/18/2022   10:18 AM 10/26/2022    8:33 AM 10/13/2022    8:59 AM  BP/Weight  Systolic BP 109 125 -- 108 107 102 136  Diastolic BP 70 80 -- 78 71 82 82  Wt. (Lbs) 273.08 280.12 276 276.08 284.04 282.8 287.03  BMI 41.52 kg/m2 42.59 kg/m2 41.97 kg/m2 41.98 kg/m2 43.19 kg/m2 43 kg/m2 43.64 kg/m2      Latest Ref Rng & Units 08/09/2022   12:00 AM 10/27/2021    8:20 AM  Foot/eye exam completion dates  Eye Exam No Retinopathy No Retinopathy       Foot Form Completion   Done     This result is from an external source.      SAtill uincontroled , no change, needs to stop sodas

## 2023-08-02 NOTE — Assessment & Plan Note (Signed)
 Controlled, no change in medication

## 2023-08-02 NOTE — Assessment & Plan Note (Signed)
 Calcium  1200 mg and vit D 1000 UNITS DAILUY AND REGULAR WEIGHT BEARING EXERCISAE  RE EVAL IN 2 YEARS

## 2023-08-02 NOTE — Progress Notes (Signed)
 [name]     MRN: [id number]      DOB: [birth year]  Chief Complaint  Patient presents with   Medical Management of Chronic Issues    13 week follow up    Flank Pain    Complains of intermittent right flank pain x1 week. Describes it as a pinching pain    HPI [name] is here for follow up and re-evaluation of chronic medical conditions, medication management and review of any available recent lab and radiology data.  Preventive health is updated, specifically  Cancer screening and Immunization.   Questions or concerns regarding consultations or procedures which the PT has had in the interim are  addressed. The PT denies any adverse reactions to current medications since the last visit.  3 episodes of sharp 10 plus RUQ pain, duration less than  2 minutes Increased stress on the job, has increased soft drinks for stress relief  Denies polyuria, polydipsia, blurred vision , or hypoglycemic episodes. C/o bilateral knee pain and stiffness   ROS Denies recent fever or chills. Denies sinus pressure, nasal congestion, ear pain or sore throat. Denies chest congestion, productive cough or wheezing. Denies chest pains, palpitations and leg swelling Denies , nausea, vomiting,diarrhea or constipation.   Denies dysuria, frequency, hesitancy or incontinence. Denies headaches, seizures, . Denies depression, anxiety or insomnia. Denies skin break down or rash.   PE  BP 109/70   Pulse 88   Resp 16   Ht 5' 8 (1.727 m)   Wt 273 lb 1.3 oz (123.9 kg)   SpO2 92%   BMI 41.52 kg/m   Patient alert and oriented and in no cardiopulmonary distress.  HEENT: No facial asymmetry, EOMI,     Neck supple .  Chest: Clear to auscultation bilaterally.  CVS: S1, S2 no murmurs, no S3.Regular rate.  ABD: Soft non tender.   Ext: No edema  FD:izrmzjdzi  ROM spine, shoulders, hips and knees.  Skin: Intact, no ulcerations or rash noted.  Psych: Good eye contact, normal affect. Memory  intact not anxious or depressed appearing.  CNS: CN 2-12 intact, power,  normal throughout.no focal deficits noted.   Assessment & Plan  Hypothyroidism Controlled, no change in medication   Hypertension goal BP (blood pressure) < 130/80 Controlled, no change in medication DASH diet and commitment to daily physical activity for a minimum of 30 minutes discussed and encouraged, as a part of hypertension management. The importance of attaining a healthy weight is also discussed.     08/02/2023    9:13 AM 04/29/2023    8:05 AM 02/15/2023    2:03 PM 01/27/2023    9:40 AM 11/18/2022   10:18 AM 10/26/2022    8:33 AM 10/13/2022    8:59 AM  BP/Weight  Systolic BP 109 125 -- 108 107 102 136  Diastolic BP 70 80 -- 78 71 82 82  Wt. (Lbs) 273.08 280.12 276 276.08 284.04 282.8 287.03  BMI 41.52 kg/m2 42.59 kg/m2 41.97 kg/m2 41.98 kg/m2 43.19 kg/m2 43 kg/m2 43.64 kg/m2       Hyperlipidemia Hyperlipidemia:Low fat diet discussed and encouraged.   Lipid Panel  Lab Results  Component Value Date   CHOL 137 07/25/2023   HDL 51 07/25/2023   LDLCALC 62 07/25/2023   TRIG 138 07/25/2023   CHOLHDL 2.7 07/25/2023     Controlled, no change in medication   Morbid obesity  Patient re-educated about  the importance of commitment to a  minimum of 150 minutes of  exercise per week as able.  The importance of healthy food choices with portion control discussed, as well as eating regularly and within a 12 hour window most days. The need to choose clean , green food 50 to 75% of the time is discussed, as well as to make water the primary drink and set a goal of 64 ounces water daily.       08/02/2023    9:13 AM 04/29/2023    8:05 AM 02/15/2023    2:03 PM  Weight /BMI  Weight 273 lb 1.3 oz 280 lb 1.9 oz 276 lb  Height 5' 8 (1.727 m) 5' 8 (1.727 m) 5' 8 (1.727 m)  BMI 41.52 kg/m2 42.59 kg/m2 41.97 kg/m2    improved  Right knee pain Increaseed and uncontrolled, PREDNISONE  X 5 DAYS, 20 MG  DAILY  RUQ abdominal pain Needs GI evaluation, recurrent episode  in past year  Type 2 diabetes mellitus with hyperglycemia, without long-term current use of insulin (HCC)        Type 2 diabetes mellitus with other specified complication (HCC) Diabetes associated with hypertension, hyperlipidemia, obesity, and arthritis  [name] is reminded of the importance of commitment to daily physical activity for 30 minutes or more, as able and the need to limit carbohydrate intake to 30 to 60 grams per meal to help with blood sugar control.   The need to take medication as prescribed, test blood sugar as directed, and to call between visits if there is a concern that blood sugar is uncontrolled is also discussed.   [name] is reminded of the importance of daily foot exam, annual eye examination, and good blood sugar, blood pressure and cholesterol control.     Latest Ref Rng & Units 07/25/2023    8:51 AM 07/25/2023    8:50 AM 04/25/2023    8:23 AM 01/21/2023    8:31 AM 09/07/2022    9:17 AM  Diabetic Labs  HbA1c 4.8 - 5.6 % 8.1   8.1  8.6  10.5   Micro/Creat Ratio 0 - 29 mg/g creat    <5    Chol 100 - 199 mg/dL  862  861   857   HDL >60 mg/dL  51  53   55   Calc LDL 0 - 99 mg/dL  62  56   64   Triglycerides 0 - 149 mg/dL  861  825   867   Creatinine 0.57 - 1.00 mg/dL 9.07   8.97  9.03  9.01       08/02/2023    9:13 AM 04/29/2023    8:05 AM 02/15/2023    2:03 PM 01/27/2023    9:40 AM 11/18/2022   10:18 AM 10/26/2022    8:33 AM 10/13/2022    8:59 AM  BP/Weight  Systolic BP 109 125 -- 108 107 102 136  Diastolic BP 70 80 -- 78 71 82 82  Wt. (Lbs) 273.08 280.12 276 276.08 284.04 282.8 287.03  BMI 41.52 kg/m2 42.59 kg/m2 41.97 kg/m2 41.98 kg/m2 43.19 kg/m2 43 kg/m2 43.64 kg/m2      Latest Ref Rng & Units 08/09/2022   12:00 AM 10/27/2021    8:20 AM  Foot/eye exam completion dates  Eye Exam No Retinopathy No Retinopathy       Foot Form Completion   Done     This result is from an  external source.      SAtill uincontroled , no change, needs to stop sodas  Osteopenia  Calcium  1200 mg and vit D 1000 UNITS DAILUY AND REGULAR WEIGHT BEARING EXERCISAE  RE EVAL IN 2 YEARS  Vitamin D  deficiency Updated lab needed at/ before next visit.

## 2023-08-02 NOTE — Assessment & Plan Note (Signed)
  Patient re-educated about  the importance of commitment to a  minimum of 150 minutes of exercise per week as able.  The importance of healthy food choices with portion control discussed, as well as eating regularly and within a 12 hour window most days. The need to choose clean , green food 50 to 75% of the time is discussed, as well as to make water the primary drink and set a goal of 64 ounces water daily.       08/02/2023    9:13 AM 04/29/2023    8:05 AM 02/15/2023    2:03 PM  Weight /BMI  Weight 273 lb 1.3 oz 280 lb 1.9 oz 276 lb  Height 5' 8 (1.727 m) 5' 8 (1.727 m) 5' 8 (1.727 m)  BMI 41.52 kg/m2 42.59 kg/m2 41.97 kg/m2    improved

## 2023-08-03 DIAGNOSIS — G5711 Meralgia paresthetica, right lower limb: Secondary | ICD-10-CM | POA: Diagnosis not present

## 2023-08-03 DIAGNOSIS — M47812 Spondylosis without myelopathy or radiculopathy, cervical region: Secondary | ICD-10-CM | POA: Diagnosis not present

## 2023-08-03 DIAGNOSIS — M5416 Radiculopathy, lumbar region: Secondary | ICD-10-CM | POA: Diagnosis not present

## 2023-08-03 DIAGNOSIS — M47816 Spondylosis without myelopathy or radiculopathy, lumbar region: Secondary | ICD-10-CM | POA: Diagnosis not present

## 2023-08-03 DIAGNOSIS — Z79890 Hormone replacement therapy (postmenopausal): Secondary | ICD-10-CM | POA: Diagnosis not present

## 2023-08-03 DIAGNOSIS — Z79891 Long term (current) use of opiate analgesic: Secondary | ICD-10-CM | POA: Diagnosis not present

## 2023-08-05 ENCOUNTER — Other Ambulatory Visit: Payer: Self-pay | Admitting: Family Medicine

## 2023-08-26 ENCOUNTER — Other Ambulatory Visit: Payer: Self-pay | Admitting: Family Medicine

## 2023-09-15 ENCOUNTER — Other Ambulatory Visit: Payer: Self-pay | Admitting: Family Medicine

## 2023-09-20 ENCOUNTER — Other Ambulatory Visit: Payer: Self-pay | Admitting: Family Medicine

## 2023-10-20 ENCOUNTER — Encounter: Payer: Self-pay | Admitting: Physician Assistant

## 2023-10-20 ENCOUNTER — Ambulatory Visit: Attending: Physician Assistant | Admitting: Physician Assistant

## 2023-10-20 VITALS — BP 100/65 | HR 87 | Ht 68.0 in | Wt 272.6 lb

## 2023-10-20 DIAGNOSIS — E119 Type 2 diabetes mellitus without complications: Secondary | ICD-10-CM

## 2023-10-20 DIAGNOSIS — Z8249 Family history of ischemic heart disease and other diseases of the circulatory system: Secondary | ICD-10-CM

## 2023-10-20 DIAGNOSIS — I10 Essential (primary) hypertension: Secondary | ICD-10-CM | POA: Diagnosis not present

## 2023-10-20 MED ORDER — FUROSEMIDE 20 MG PO TABS: 20.0000 mg | ORAL_TABLET | ORAL | Status: AC | PRN

## 2023-10-20 NOTE — Patient Instructions (Addendum)
 Medication Instructions:  NO CHANGES, CONTINUE WITH YOUR CURRENT MEDICATION THERAPY.  Lab Work: NONE TO BE DONE TODAY.  Testing/Procedures: NONE  Follow-Up: At [facility], you and your health needs are our priority.  As part of our continuing mission to provide you with exceptional heart care, our providers are all part of one team.  This team includes your primary Cardiologist (physician) and Advanced Practice Providers or APPs (Physician Assistants and Nurse Practitioners) who all work together to provide you with the care you need, when you need it.  Your next appointment:   AS NEEDED  Provider:   DR. KATE

## 2023-10-20 NOTE — Progress Notes (Unsigned)
 Cardiology Office Note   Date:  10/20/2023  ID:  Mckenzie, Bove [birth year], MRN [id number] PCP: Antonetta Rollene BRAVO, MD  [facility] Providers Cardiologist:  Debby Sor, MD (Inactive)   --> will set up with Dr. Kate  History of Present Illness ANALAYA HOEY is a 68 y.o. female with past medical history of morbid obesity, hypertension, DM2 and GERD.  She has significant family history of CAD.  Echocardiogram obtained in November 2017 showed EF 50 to 55%, grade 1 DD, trivial MR.  Myoview  was low risk showed normal perfusion and function.  He ultimately underwent a sleep study in July 2018 that revealed increased upper airway resistance syndrome with AHI of 3.6/h and the reduced sleep efficiency of 63.4%.  She has mild oxygen desaturation with nadir of 87%.  There was abnormal sleep architecture with absence of slow-wave sleep, reduction of the REM sleep and prolonged latency to REM sleep development.  She did not meet CPAP criteria at the time.  She has tried Ozempic  but developed a yeast infection.  Previous CT of abdomen and pelvis showed a small hiatal hernia, hepatic steatosis, mild nonspecific mesenteric inflammation and fat stranding within the mid to lower left abdomen which may be chronic in nature, aortic atherosclerosis.  I last saw the patient in 2023, she was having intermittent abdominal pain and chest pain which was worse when she tried to swallow food, symptoms sounds more GI rather than cardiac, we decided to hold off on ischemic workup.  Patient was last seen by Dr. Sor in October 2024 at which time her stress level has significantly improved after separating from her husband.  Patient presents today for follow-up.  She denies any chest pain or shortness of breath.  She is only using Lasix  as needed and in the past 5-month only used at 1 tablet.  Synjardy  is very working very well for her diabetes.  She is on the Crestor  40 mg daily.  Most recent lipid panel obtained  in June 2025 showed very well-controlled cholesterol.  From the cardiac standpoint, she is doing well.  She has low stress level.  She does have chronic back pain that limit her functional ability.  Since Dr. Sor has retired, I offer her to follow-up annually with Dr. Lonni Kate or follow-up as needed.  She opted to follow-up as needed which I think is reasonable since she never had a heart attack or stroke in the past.   ROS:   She denies chest pain, palpitations, dyspnea, pnd, orthopnea, n, v, dizziness, syncope, edema, weight gain, or early satiety. All other systems reviewed and are otherwise negative except as noted above.    Studies Reviewed      Cardiac Studies & Procedures   ______________________________________________________________________________________________   STRESS TESTS  MYOCARDIAL PERFUSION IMAGING 11/28/2015  Interpretation Summary  The left ventricular ejection fraction is normal (55-65%).  Nuclear stress EF: 55%.  There was no ST segment deviation noted during stress.  The study is normal.  This is a low risk study.   ECHOCARDIOGRAM  ECHOCARDIOGRAM COMPLETE 12/03/2015  Narrative *[facility]* [address] [place], [place] [address] [phone]  ------------------------------------------------------------------- Transthoracic Echocardiography  Patient:    Loys, Hoselton MR #:       [id number] Study Date: 12/03/2015 Gender:     F Age:        60 Height:     172.7 cm Weight:     126.6 kg BSA:  2.53 m^2 Pt. Status: Room:  REFERRING    Antonetta Rollene BRAVO ORDERING     Barrett, Shona MATSU REFERRING    Barrett, Shona MATSU ATTENDING    Ezra Shuck, M.D. SONOGRAPHER  Sherida Lawyer, RDCS PERFORMING   [facility], Outpatient  cc:  ------------------------------------------------------------------- LV EF: 50% -   55%  ------------------------------------------------------------------- Indications:      Chest pain  (R07.2).  ------------------------------------------------------------------- History:   Risk factors:  Vertigo. Hypertension. Diabetes mellitus. Morbidly obese. Dyslipidemia.  ------------------------------------------------------------------- Study Conclusions  - Left ventricle: The cavity size was normal. Wall thickness was normal. Systolic function was low normal to mildly reduced. The estimated ejection fraction was in the range of 50% to 55%. Wall motion was normal; there were no regional wall motion abnormalities. Doppler parameters are consistent with abnormal left ventricular relaxation (grade 1 diastolic dysfunction). - Aortic valve: There was no stenosis. - Mitral valve: Mildly calcified annulus. There was trivial regurgitation. - Right ventricle: The cavity size was normal. Systolic function was normal. - Tricuspid valve: Peak RV-RA gradient (S): 16 mm Hg. - Pulmonary arteries: PA peak pressure: 19 mm Hg (S). - Inferior vena cava: The vessel was normal in size. The respirophasic diameter changes were in the normal range (>= 50%), consistent with normal central venous pressure.  Impressions:  - Normal LV size with EF 50-55%, low normal to mildly reduced systolic function. Normal RV size and systolic function. No significant valvular abnormalities.  ------------------------------------------------------------------- Study data:  No prior study was available for comparison.  Study status:  Routine.  Procedure:  The patient reported no pain pre or post test. Transthoracic echocardiography. Image quality was adequate.  Study completion:  There were no complications. Transthoracic echocardiography.  M-mode, complete 2D, spectral Doppler, and color Doppler.  Birthdate:  Patient birthdate: 1955-02-22.  Age:  Patient is 68 yr old.  Sex:  Gender: female. BMI: 42.4 kg/m^2.  Blood pressure:     128/72  Patient status: Outpatient.  Study date:  Study date: 12/03/2015. Study  time: 11:27 AM.  Location:  [facility]  -------------------------------------------------------------------  ------------------------------------------------------------------- Left ventricle:  The cavity size was normal. Wall thickness was normal. Systolic function was low normal to mildly reduced. The estimated ejection fraction was in the range of 50% to 55%. Wall motion was normal; there were no regional wall motion abnormalities. Doppler parameters are consistent with abnormal left ventricular relaxation (grade 1 diastolic dysfunction).  ------------------------------------------------------------------- Aortic valve:   Trileaflet; mildly calcified leaflets.  Doppler: There was no stenosis.   There was no regurgitation.  ------------------------------------------------------------------- Aorta:  Aortic root: The aortic root was normal in size. Ascending aorta: The ascending aorta was normal in size.  ------------------------------------------------------------------- Mitral valve:   Mildly calcified annulus.  Doppler:   There was no evidence for stenosis.   There was trivial regurgitation.    Peak gradient (D): 6 mm Hg.  ------------------------------------------------------------------- Left atrium:  The atrium was normal in size.  ------------------------------------------------------------------- Right ventricle:  The cavity size was normal. Systolic function was normal.  ------------------------------------------------------------------- Pulmonic valve:    Structurally normal valve.   Cusp separation was normal.  Doppler:  Transvalvular velocity was within the normal range. There was no regurgitation.  ------------------------------------------------------------------- Tricuspid valve:   Doppler:  There was trivial regurgitation.  ------------------------------------------------------------------- Right atrium:  The atrium was normal in  size.  ------------------------------------------------------------------- Pericardium:  There was no pericardial effusion.  ------------------------------------------------------------------- Systemic veins: Inferior vena cava: The vessel was normal in size. The respirophasic diameter changes were in the  normal range (>= 50%), consistent with normal central venous pressure.  ------------------------------------------------------------------- Measurements  Left ventricle                         Value        Reference LV ID, ED, PLAX chordal                46.9  mm     43 - 52 LV ID, ES, PLAX chordal                30.9  mm     23 - 38 LV fx shortening, PLAX chordal         34    %      >=29 LV PW thickness, ED                    9.1   mm     --------- IVS/LV PW ratio, ED                    0.89         <=1.3 Stroke volume, 2D                      84    ml     --------- Stroke volume/bsa, 2D                  33    ml/m^2 --------- LV e&', lateral                         9.68  cm/s   --------- LV E/e&', lateral                       12.19        --------- LV e&', medial                          8.59  cm/s   --------- LV E/e&', medial                        13.74        --------- LV e&', average                         9.14  cm/s   --------- LV E/e&', average                       12.92        ---------  Ventricular septum                     Value        Reference IVS thickness, ED                      8.1   mm     ---------  LVOT                                   Value        Reference LVOT ID, S  21    mm     --------- LVOT area                              3.46  cm^2   --------- LVOT peak velocity, S                  112   cm/s   --------- LVOT mean velocity, S                  75.6  cm/s   --------- LVOT VTI, S                            24.4  cm     --------- LVOT peak gradient, S                  5     mm Hg  ---------  Aorta                                   Value        Reference Aortic root ID, ED                     32    mm     --------- Ascending aorta ID, A-P, S             32    mm     ---------  Left atrium                            Value        Reference LA ID, A-P, ES                         37    mm     --------- LA ID/bsa, A-P                         1.46  cm/m^2 <=2.2 LA volume, S                           39.9  ml     --------- LA volume/bsa, S                       15.8  ml/m^2 --------- LA volume, ES, 1-p A4C                 50.6  ml     --------- LA volume/bsa, ES, 1-p A4C             20    ml/m^2 --------- LA volume, ES, 1-p A2C                 30.4  ml     --------- LA volume/bsa, ES, 1-p A2C             12    ml/m^2 ---------  Mitral valve                           Value        Reference Mitral E-wave peak velocity  118   cm/s   --------- Mitral A-wave peak velocity            123   cm/s   --------- Mitral deceleration time       (H)     261   ms     150 - 230 Mitral peak gradient, D                6     mm Hg  --------- Mitral E/A ratio, peak                 1            ---------  Pulmonary arteries                     Value        Reference PA pressure, S, DP                     19    mm Hg  <=30  Tricuspid valve                        Value        Reference Tricuspid regurg peak velocity         203   cm/s   --------- Tricuspid peak RV-RA gradient          16    mm Hg  ---------  Systemic veins                         Value        Reference Estimated CVP                          3     mm Hg  ---------  Right ventricle                        Value        Reference TAPSE                                  23.3  mm     --------- RV s&', lateral, S                      10.3  cm/s   ---------  Legend: (L)  and  (H)  mark values outside specified reference range.  ------------------------------------------------------------------- Prepared and Electronically Authenticated by  Ezra Shuck,  M.D. 2017-11-08T18:10:18          ______________________________________________________________________________________________      Risk Assessment/Calculations           Physical Exam VS:  BP 100/65   Pulse 87   Ht 5' 8 (1.727 m)   Wt 272 lb 9.6 oz (123.7 kg)   SpO2 94%   BMI 41.45 kg/m        Wt Readings from Last 3 Encounters:  10/20/23 272 lb 9.6 oz (123.7 kg)  08/02/23 273 lb 1.3 oz (123.9 kg)  04/29/23 280 lb 1.9 oz (127.1 kg)    GEN: Well nourished, well developed in no acute distress NECK: No JVD; No carotid bruits CARDIAC: RRR, no murmurs, rubs, gallops RESPIRATORY:  Clear to auscultation without rales, wheezing or rhonchi  ABDOMEN: Soft, non-tender, non-distended EXTREMITIES:  No edema; No deformity   ASSESSMENT AND PLAN  Family history of heart disease: Patient has never had an MI or stroke in the past.  Echocardiogram and the stress test in 2017 were normal.  She does not have any exertional chest discomfort or shortness of breath.  She can follow-up with cardiology service as needed in the future.  Hypertension: Blood pressure well-controlled  Hyperlipidemia: On rosuvastatin   DM2: Continue Synjardy .  Managed by primary care provider.       Dispo: Follow-up with cardiology service as needed  Signed, Makennah Omura, PA

## 2023-10-26 ENCOUNTER — Other Ambulatory Visit: Payer: Self-pay | Admitting: Family Medicine

## 2023-10-26 DIAGNOSIS — E1165 Type 2 diabetes mellitus with hyperglycemia: Secondary | ICD-10-CM

## 2023-10-28 ENCOUNTER — Ambulatory Visit: Admitting: Gastroenterology

## 2023-10-28 ENCOUNTER — Encounter: Payer: Self-pay | Admitting: Gastroenterology

## 2023-10-28 VITALS — BP 112/78 | HR 62 | Ht 68.0 in | Wt 271.1 lb

## 2023-10-28 DIAGNOSIS — R1011 Right upper quadrant pain: Secondary | ICD-10-CM

## 2023-10-28 NOTE — Patient Instructions (Addendum)
 Contact our office with recurrent pain to speak with a nurse.   _______________________________________________________  If your blood pressure at your visit was 140/90 or greater, please contact your primary care physician to follow up on this.  _______________________________________________________  If you are age 68 or older, your body mass index should be between 23-30. Your Body mass index is 41.22 kg/m. If this is out of the aforementioned range listed, please consider follow up with your Primary Care Provider.  If you are age 59 or younger, your body mass index should be between 19-25. Your Body mass index is 41.22 kg/m. If this is out of the aformentioned range listed, please consider follow up with your Primary Care Provider.   ________________________________________________________  The [facility] providers would like to encourage you to use MYCHART to communicate with providers for non-urgent requests or questions.  Due to long hold times on the telephone, sending your provider a message by [facility] may be a faster and more efficient way to get a response.  Please allow 48 business hours for a response.  Please remember that this is for non-urgent requests.  _______________________________________________________  [facility] is using a team-based approach to care.  Your team is made up of your doctor and two to three APPS. Our APPS (Nurse Practitioners and Physician Assistants) work with your physician to ensure care continuity for you. They are fully qualified to address your health concerns and develop a treatment plan. They communicate directly with your gastroenterologist to care for you. Seeing the Advanced Practice Practitioners on your physician's team can help you by facilitating care more promptly, often allowing for earlier appointments, access to diagnostic testing, procedures, and other specialty referrals.

## 2023-10-28 NOTE — Progress Notes (Signed)
 10/28/2023 [name] [id number] 1955-09-01   HISTORY OF PRESENT ILLNESS:  This is a 68 year old female who is a patient of Dr. Trenna.  She is here today for evaluation of RUQ abdominal pain.  Patient describes episodes of right upper quadrant abdominal pain that occur intermittently for the past year.  Pain can last a week when it occurs.  Describes it as being about a level 3/10 on the pain scale, but definitely can feel sharper at times.  She says she can feel and see a visible knot in that area.  PCP told her it sounds like it could be inflammation of her bile ducts.  LFTs been normal.  CT scan last year was not concerning with normal liver, surgically absent gallbladder, normal pancreas.  She is status post cholecystectomy but says that she had it removed for dysfunction not for stones.  Maybe slight nausea when the pain occurs, but nothing severe, no vomiting, really no other associated symptoms.  EGD 05/2021: - Z- line regular, 36 cm from the incisors. - No endoscopic esophageal abnormality to explain patient' s dysphagia. - Gastroesophageal flap valve classified as Hill Grade III ( minimal fold, loose to endoscope, hiatal hernia likely) . - 3 cm hiatal hernia. - Gastritis. Biopsied. - Normal examined duodenum.  Surgical [P], gastric antrum and gastric body FRAGMENTS OF GASTRIC MUCOSA WITH NO SIGNIFICANT MICROSCOPIC ABNORMALITIES. H. PYLORI, INTESTINAL METAPLASIA, ATROPHY AND DYSPLASIA ARE NOT IDENTIFIED.  Past Medical History:  Diagnosis Date   Allergy    Anemia    Arthritis    Cataract    forming   COVID-19    Diabetes mellitus without complication (HCC) 2011   GERD (gastroesophageal reflux disease)    History of colon polyps    Hyperlipidemia    Hypertension 2011   Morbid obesity (HCC)    Neuromuscular disorder (HCC)    issues with nerves in back    Osteopenia    Thyroid  disease    Past Surgical History:  Procedure Laterality Date   ABDOMINAL HYSTERECTOMY      APPENDECTOMY     CHOLECYSTECTOMY     COLONOSCOPY  2009   [facility] - normal per pt    TONSILLECTOMY      reports that she has never smoked. She has never been exposed to tobacco smoke. She has never used smokeless tobacco. She reports that she does not drink alcohol and does not use drugs. family history includes Asthma in her mother; Cancer in her maternal grandmother, paternal grandfather, sister, and sister; Diabetes in her father; Heart disease (age of onset: 66) in her mother; Heart disease (age of onset: 47) in her father; Hypertension in her father and mother; Pancreatic cancer in her mother; Prostate cancer in her paternal grandmother. Allergies  Allergen Reactions   Ozempic  (0.25 Or 0.5 Mg-Dose) [Semaglutide (0.25 Or 0.5mg -Dos)] Nausea Only    dizzy   Naproxen Sodium Hives and Swelling    MD thought it was the pink dye in the naproxen   Tsh [Thyrotropin]    Statins Other (See Comments)    Reports increased appetite and possible muscle ache; tolerating rosuvastatin  40 mg daily      Outpatient Encounter Medications as of 10/28/2023  Medication Sig   aspirin 81 MG tablet Take 81 mg by mouth daily.   benazepril  (LOTENSIN ) 5 MG tablet Take 1 tablet (5 mg total) by mouth daily.   Blood Pressure KIT Large cuff to monitor blood pressure daily.  calcium -vitamin D  (OSCAL WITH D) 500-200 MG-UNIT per tablet Take 1 tablet by mouth daily.   calcium -vitamin D  (OSCAL WITH D) 500-5 MG-MCG tablet Take 1 tablet by mouth 2 (two) times daily.   cyclobenzaprine  (FLEXERIL ) 10 MG tablet Take 10 mg by mouth 3 (three) times daily as needed.   DEXILANT  30 MG capsule DR TAKE ONE CAPSULE BY MOUTH TWICE A DAY   dicyclomine  (BENTYL ) 10 MG capsule Take 1 capsule (10 mg total) by mouth 3 (three) times daily as needed for spasms.   Empagliflozin -metFORMIN  HCl (SYNJARDY ) 12.05-998 MG TABS Take 1 tablet by mouth 2 (two) times daily.   fluticasone  (FLONASE ) 50 MCG/ACT nasal spray USE 2 SPRAYS IN EACH  NOSTRIL ONCE DAILY. SHAKE GENTLY BEFORE USING.   furosemide  (LASIX ) 20 MG tablet Take 1 tablet (20 mg total) by mouth as needed. TAKE ONE TABLET BY MOUTH ONCE DAILY FOR LEG SWELLING.   gabapentin  (NEURONTIN ) 300 MG capsule Take 1 capsule (300 mg total) by mouth 2 (two) times daily.   glipiZIDE  (GLUCOTROL  XL) 10 MG 24 hr tablet TAKE 2 TABLETS BY MOUTH IN THE MORNING WITH BREAKFAST   ibuprofen  (ADVIL ) 800 MG tablet Take 800 mg by mouth 3 (three) times daily as needed.   Lancets (ONETOUCH DELICA PLUS LANCET33G) MISC USE   TO CHECK GLUCOSE ONCE DAILY   levothyroxine  (SYNTHROID ) 150 MCG tablet Take 1 tablet by mouth once daily   montelukast  (SINGULAIR ) 10 MG tablet TAKE 1 TABLET BY MOUTH AT BEDTIME   neomycin -polymyxin-hydrocortisone (CORTISPORIN) 3.5-10000-1 OTIC suspension Place 3 drops into the right ear 3 (three) times daily.   ONETOUCH VERIO test strip USE STRIP TO CHECK GLUCOSE ONCE DAILY   oxyCODONE -acetaminophen  (PERCOCET) 10-325 MG tablet Take 1 tablet by mouth 4 (four) times daily as needed for pain.   potassium chloride  SA (KLOR-CON  M) 20 MEQ tablet TAKE ONE TABLET BY MOUTH EVERY DAY AS NEEDED ON DAYS YOU TAKE FUROSEMIDE  FOR SWELLING   predniSONE  (DELTASONE ) 10 MG tablet Take 1 tablet (10 mg total) by mouth 2 (two) times daily with a meal.   rosuvastatin  (CRESTOR ) 40 MG tablet Take 1 tablet (40 mg total) by mouth daily.   traZODone  (DESYREL ) 100 MG tablet Take 1 tablet (100 mg total) by mouth at bedtime.   Turmeric 500 MG CAPS Take 1 capsule by mouth daily.   No facility-administered encounter medications on file as of 10/28/2023.    REVIEW OF SYSTEMS  : All other systems reviewed and negative except where noted in the History of Present Illness.   PHYSICAL EXAM: BP 112/78   Pulse 62   Ht 5' 8 (1.727 m)   Wt 271 lb 2 oz (123 kg)   BMI 41.22 kg/m  General: Well developed white female in no acute distress Head: Normocephalic and atraumatic Eyes:  Sclerae anicteric, conjunctiva  pink. Ears: Normal auditory acuity Lungs: Clear throughout to auscultation; no W/R/R. Heart: Regular rate and rhythm; no M/R/G. Abdomen: Soft, non-distended.  BS present.  Some RUQ and epigastric TTP. Musculoskeletal: Symmetrical with no gross deformities  Skin: No lesions on visible extremities Extremities: No edema  Neurological: Alert oriented x 4, grossly non-focal Psychological:  Alert and cooperative. Normal mood and affect  ASSESSMENT AND PLAN: *RUQ abdominal pain: Patient describes episodes of right upper quadrant abdominal pain that occur intermittently for the past year.  Pain can last a week when it occurs.  She says she can feel and see a visible knot in that area.  PCP told her it  sounds like it could be inflammation of her bile ducts.  LFTs been normal.  CT scan last year was not concerning. I feel like the visibility and palpability of it make it seem much more likely to be a muscle or potentially hernia in that area.  Nonetheless patient will contact our office at the onset of an episode we will check LFTs and plan to proceed with possible MRI/MRCP for further imaging of that area.  She is status post cholecystectomy, but says that gallbladder was removed for dysfunction, not gallstones.   CC:  Antonetta Rollene BRAVO, MD

## 2023-10-31 DIAGNOSIS — E1142 Type 2 diabetes mellitus with diabetic polyneuropathy: Secondary | ICD-10-CM | POA: Diagnosis not present

## 2023-10-31 DIAGNOSIS — M47812 Spondylosis without myelopathy or radiculopathy, cervical region: Secondary | ICD-10-CM | POA: Diagnosis not present

## 2023-10-31 DIAGNOSIS — M47816 Spondylosis without myelopathy or radiculopathy, lumbar region: Secondary | ICD-10-CM | POA: Diagnosis not present

## 2023-10-31 DIAGNOSIS — G5711 Meralgia paresthetica, right lower limb: Secondary | ICD-10-CM | POA: Diagnosis not present

## 2023-11-30 DIAGNOSIS — E1169 Type 2 diabetes mellitus with other specified complication: Secondary | ICD-10-CM | POA: Diagnosis not present

## 2023-11-30 DIAGNOSIS — E559 Vitamin D deficiency, unspecified: Secondary | ICD-10-CM | POA: Diagnosis not present

## 2023-12-01 ENCOUNTER — Ambulatory Visit: Payer: Self-pay | Admitting: Family Medicine

## 2023-12-01 LAB — BMP8+EGFR
BUN/Creatinine Ratio: 14 (ref 12–28)
BUN: 12 mg/dL (ref 8–27)
CO2: 24 mmol/L (ref 20–29)
Calcium: 9.8 mg/dL (ref 8.7–10.3)
Chloride: 103 mmol/L (ref 96–106)
Creatinine, Ser: 0.83 mg/dL (ref 0.57–1.00)
Glucose: 149 mg/dL — ABNORMAL HIGH (ref 70–99)
Potassium: 4.4 mmol/L (ref 3.5–5.2)
Sodium: 143 mmol/L (ref 134–144)
eGFR: 77 mL/min/1.73 (ref >59)

## 2023-12-01 LAB — HEMOGLOBIN A1C
Est. average glucose Bld gHb Est-mCnc: 206 mg/dL
Hgb A1c MFr Bld: 8.8 % — ABNORMAL HIGH (ref 4.8–5.6)

## 2023-12-01 LAB — VITAMIN D 25 HYDROXY (VIT D DEFICIENCY, FRACTURES): Vit D, 25-Hydroxy: 41.8 ng/mL (ref 30.0–100.0)

## 2023-12-06 ENCOUNTER — Encounter: Payer: Self-pay | Admitting: Family Medicine

## 2023-12-06 ENCOUNTER — Ambulatory Visit: Admitting: Family Medicine

## 2023-12-06 VITALS — BP 116/76 | HR 95 | Resp 18 | Ht 68.0 in

## 2023-12-06 DIAGNOSIS — E1169 Type 2 diabetes mellitus with other specified complication: Secondary | ICD-10-CM | POA: Diagnosis not present

## 2023-12-06 DIAGNOSIS — E039 Hypothyroidism, unspecified: Secondary | ICD-10-CM

## 2023-12-06 DIAGNOSIS — I10 Essential (primary) hypertension: Secondary | ICD-10-CM | POA: Diagnosis not present

## 2023-12-06 DIAGNOSIS — E785 Hyperlipidemia, unspecified: Secondary | ICD-10-CM | POA: Diagnosis not present

## 2023-12-06 DIAGNOSIS — F411 Generalized anxiety disorder: Secondary | ICD-10-CM

## 2023-12-06 DIAGNOSIS — Z23 Encounter for immunization: Secondary | ICD-10-CM

## 2023-12-06 DIAGNOSIS — E1159 Type 2 diabetes mellitus with other circulatory complications: Secondary | ICD-10-CM

## 2023-12-06 DIAGNOSIS — F321 Major depressive disorder, single episode, moderate: Secondary | ICD-10-CM

## 2023-12-06 MED ORDER — BLOOD GLUCOSE TEST VI STRP: 1.0000 | ORAL_STRIP | 2 refills | Status: AC

## 2023-12-06 MED ORDER — BUSPIRONE HCL 5 MG PO TABS: 5.0000 mg | ORAL_TABLET | Freq: Three times a day (TID) | ORAL | 3 refills | Status: AC

## 2023-12-06 MED ORDER — LANCET DEVICE MISC: 1.0000 | 0 refills | Status: AC

## 2023-12-06 MED ORDER — BLOOD PRESSURE KIT: PACK | 0 refills | Status: AC

## 2023-12-06 MED ORDER — BUPROPION HCL ER (XL) 300 MG PO TB24: 300.0000 mg | ORAL_TABLET | Freq: Every day | ORAL | 1 refills | Status: AC

## 2023-12-06 MED ORDER — FLUCONAZOLE 150 MG PO TABS: 150.0000 mg | ORAL_TABLET | Freq: Once | ORAL | 0 refills | Status: AC

## 2023-12-06 MED ORDER — BLOOD GLUCOSE MONITORING SUPPL DEVI: 1.0000 | 0 refills | Status: AC

## 2023-12-06 MED ORDER — LANCETS MISC: 1.0000 | 2 refills | Status: AC

## 2023-12-06 MED ORDER — LORATADINE 10 MG PO TABS: 10.0000 mg | ORAL_TABLET | Freq: Every day | ORAL | 3 refills | Status: AC

## 2023-12-06 NOTE — Assessment & Plan Note (Signed)
 After obtaining informed consent, the influenza vaccine is  administered , with no adverse effect noted at the time of administration.

## 2023-12-06 NOTE — Patient Instructions (Addendum)
 Annual exam in 13 weeks  Urine ACR 01/22/2024 or after   Fasting lipid, cmp and EgFR, HBA1C, TSH   Nurse pls print and give pt rx for BP cuff  Nurse pls print and give new blood sugar testing supplies and meter ,with once daily testing  Goal for fasting blood sugar ranges from 80 to 130 and 2 hours after any meal or at bedtime should be between 130 to 180.  Flu vaccine today  New for depression and anxiety are wellbutrin  and buspar  Thanks for choosing [facility], we consider it a privelige to serve you.

## 2023-12-06 NOTE — Progress Notes (Signed)
 [name]     MRN: [id number]      DOB: 1955/02/18  Chief Complaint  Patient presents with   Diabetes    4 month follow up     HPI [name] is here for follow up and re-evaluation of chronic medical conditions, medication management and review of any available recent lab and radiology data.  Preventive health is updated, specifically  Cancer screening and Immunization.   Increased anxiety and depression , overwhelmed with life's chalenges, eating excess candy, still refuses insulin states if no better in 3 months will take the insulin ROS Denies recent fever or chills. Denies sinus pressure, nasal congestion, ear pain or sore throat. Denies chest congestion, productive cough or wheezing. Denies chest pains, palpitations and leg swelling Denies abdominal pain, nausea, vomiting,diarrhea or constipation.   Denies dysuria, frequency, hesitancy or incontinence. Denies uncontrolled  joint pain, swelling and limitation in mobility. Denies headaches, seizures, numbness, or tingling.  Denies skin break down or rash.   PE  BP 116/76   Pulse 95   Resp 18   Ht 5' 8 (1.727 m)   SpO2 95%   BMI 41.22 kg/m   Patient alert and oriented and in no cardiopulmonary distress.  HEENT: No facial asymmetry, EOMI,     Neck supple .  Chest: Clear to auscultation bilaterally.  CVS: S1, S2 no murmurs, no S3.Regular rate.  ABD: Soft non tender.   Ext: No edema  MS: decreased  ROM spine, adequate in shoulders, hips and knees.  Skin: Intact, no ulcerations or rash noted.  Psych: Good eye contact, normal affect. Memory intact not anxious or depressed appearing.  CNS: CN 2-12 intact, power,  normal throughout.no focal deficits noted.   Assessment & Plan  Depression, major, single episode, moderate (HCC) Start Wellbutrin , declined therapy  GAD (generalized anxiety disorder) Start buspar 3 times daily, no interest in therapy, a lot of housing/ financial need  Hypertension goal  BP (blood pressure) < 130/80 Controlled, no change in medication DASH diet and commitment to daily physical activity for a minimum of 30 minutes discussed and encouraged, as a part of hypertension management. The importance of attaining a healthy weight is also discussed.     12/06/2023    8:35 AM 10/28/2023    8:30 AM 10/20/2023    9:14 AM 08/02/2023    9:13 AM 04/29/2023    8:05 AM 02/15/2023    2:03 PM 01/27/2023    9:40 AM  BP/Weight  Systolic BP 116 112 100 109 125 -- 108  Diastolic BP 76 78 65 70 80 -- 78  Wt. (Lbs)  271.13 272.6 273.08 280.12 276 276.08  BMI  41.22 kg/m2 41.45 kg/m2 41.52 kg/m2 42.59 kg/m2 41.97 kg/m2 41.98 kg/m2       Hyperlipidemia Hyperlipidemia:Low fat diet discussed and encouraged.   Lipid Panel  Lab Results  Component Value Date   CHOL 137 07/25/2023   HDL 51 07/25/2023   LDLCALC 62 07/25/2023   TRIG 138 07/25/2023   CHOLHDL 2.7 07/25/2023     Controlled, no change in medication Updated lab needed at/ before next visit.   Hypothyroidism Controlled, no change in medication Updated lab needed at/ before next visit.   Influenza vaccination administered at current visit After obtaining informed consent, the influenza vaccine is  administered , with no adverse effect noted at the time of administration.   Type 2 diabetes mellitus with other specified complication (HCC) Deteriorated, eating / food choice uncontrolled, again delaying  insulin Diabetes associated with hypertension, hyperlipidemia, obesity, arthritis, and depression  [name] is reminded of the importance of commitment to daily physical activity for 30 minutes or more, as able and the need to limit carbohydrate intake to 30 to 60 grams per meal to help with blood sugar control.   The need to take medication as prescribed, test blood sugar as directed, and to call between visits if there is a concern that blood sugar is uncontrolled is also discussed.   [name] is reminded of  the importance of daily foot exam, annual eye examination, and good blood sugar, blood pressure and cholesterol control.     Latest Ref Rng & Units 11/30/2023    8:27 AM 07/25/2023    8:51 AM 07/25/2023    8:50 AM 07/24/2023   12:00 AM 04/25/2023    8:23 AM  Diabetic Labs  HbA1c 4.8 - 5.6 % 8.8  8.1    8.1   Chol 100 - 199 mg/dL   862   861   HDL >60 mg/dL   51   53   Calc LDL 0 - 99 mg/dL   62   56   Triglycerides 0 - 149 mg/dL   861   825   Creatinine 0.57 - 1.00 mg/dL 9.16  9.07   0.7     8.97      This result is from an external source.      12/06/2023    8:35 AM 10/28/2023    8:30 AM 10/20/2023    9:14 AM 08/02/2023    9:13 AM 04/29/2023    8:05 AM 02/15/2023    2:03 PM 01/27/2023    9:40 AM  BP/Weight  Systolic BP 116 112 100 109 125 -- 108  Diastolic BP 76 78 65 70 80 -- 78  Wt. (Lbs)  271.13 272.6 273.08 280.12 276 276.08  BMI  41.22 kg/m2 41.45 kg/m2 41.52 kg/m2 42.59 kg/m2 41.97 kg/m2 41.98 kg/m2      Latest Ref Rng & Units 12/06/2023    8:40 AM 08/01/2023   12:00 AM  Foot/eye exam completion dates  Eye Exam No Retinopathy  No Retinopathy      Foot Form Completion  Done      This result is from an external source.        Morbid obesity (HCC)  Patient re-educated about  the importance of commitment to a  minimum of 150 minutes of exercise per week as able.  The importance of healthy food choices with portion control discussed, as well as eating regularly and within a 12 hour window most days. The need to choose clean , green food 50 to 75% of the time is discussed, as well as to make water the primary drink and set a goal of 64 ounces water daily.       10/28/2023    8:30 AM 10/20/2023    9:14 AM 08/02/2023    9:13 AM  Weight /BMI  Weight 271 lb 2 oz 272 lb 9.6 oz 273 lb 1.3 oz  Height 5' 8 (1.727 m) 5' 8 (1.727 m) 5' 8 (1.727 m)  BMI 41.22 kg/m2 41.45 kg/m2 41.52 kg/m2    Unchanged

## 2023-12-06 NOTE — Assessment & Plan Note (Signed)
 Controlled, no change in medication DASH diet and commitment to daily physical activity for a minimum of 30 minutes discussed and encouraged, as a part of hypertension management. The importance of attaining a healthy weight is also discussed.     12/06/2023    8:35 AM 10/28/2023    8:30 AM 10/20/2023    9:14 AM 08/02/2023    9:13 AM 04/29/2023    8:05 AM 02/15/2023    2:03 PM 01/27/2023    9:40 AM  BP/Weight  Systolic BP 116 112 100 109 125 -- 108  Diastolic BP 76 78 65 70 80 -- 78  Wt. (Lbs)  271.13 272.6 273.08 280.12 276 276.08  BMI  41.22 kg/m2 41.45 kg/m2 41.52 kg/m2 42.59 kg/m2 41.97 kg/m2 41.98 kg/m2

## 2023-12-06 NOTE — Assessment & Plan Note (Signed)
 Start Wellbutrin , declined therapy

## 2023-12-06 NOTE — Assessment & Plan Note (Signed)
 Hyperlipidemia:Low fat diet discussed and encouraged.   Lipid Panel  Lab Results  Component Value Date   CHOL 137 07/25/2023   HDL 51 07/25/2023   LDLCALC 62 07/25/2023   TRIG 138 07/25/2023   CHOLHDL 2.7 07/25/2023     Controlled, no change in medication Updated lab needed at/ before next visit.

## 2023-12-06 NOTE — Assessment & Plan Note (Signed)
 Deteriorated, eating / food choice uncontrolled, again delaying insulin Diabetes associated with hypertension, hyperlipidemia, obesity, arthritis, and depression  [name] is reminded of the importance of commitment to daily physical activity for 30 minutes or more, as able and the need to limit carbohydrate intake to 30 to 60 grams per meal to help with blood sugar control.   The need to take medication as prescribed, test blood sugar as directed, and to call between visits if there is a concern that blood sugar is uncontrolled is also discussed.   [name] is reminded of the importance of daily foot exam, annual eye examination, and good blood sugar, blood pressure and cholesterol control.     Latest Ref Rng & Units 11/30/2023    8:27 AM 07/25/2023    8:51 AM 07/25/2023    8:50 AM 07/24/2023   12:00 AM 04/25/2023    8:23 AM  Diabetic Labs  HbA1c 4.8 - 5.6 % 8.8  8.1    8.1   Chol 100 - 199 mg/dL   862   861   HDL >60 mg/dL   51   53   Calc LDL 0 - 99 mg/dL   62   56   Triglycerides 0 - 149 mg/dL   861   825   Creatinine 0.57 - 1.00 mg/dL 9.16  9.07   0.7     8.97      This result is from an external source.      12/06/2023    8:35 AM 10/28/2023    8:30 AM 10/20/2023    9:14 AM 08/02/2023    9:13 AM 04/29/2023    8:05 AM 02/15/2023    2:03 PM 01/27/2023    9:40 AM  BP/Weight  Systolic BP 116 112 100 109 125 -- 108  Diastolic BP 76 78 65 70 80 -- 78  Wt. (Lbs)  271.13 272.6 273.08 280.12 276 276.08  BMI  41.22 kg/m2 41.45 kg/m2 41.52 kg/m2 42.59 kg/m2 41.97 kg/m2 41.98 kg/m2      Latest Ref Rng & Units 12/06/2023    8:40 AM 08/01/2023   12:00 AM  Foot/eye exam completion dates  Eye Exam No Retinopathy  No Retinopathy      Foot Form Completion  Done      This result is from an external source.

## 2023-12-06 NOTE — Assessment & Plan Note (Signed)
  Patient re-educated about  the importance of commitment to a  minimum of 150 minutes of exercise per week as able.  The importance of healthy food choices with portion control discussed, as well as eating regularly and within a 12 hour window most days. The need to choose clean , green food 50 to 75% of the time is discussed, as well as to make water the primary drink and set a goal of 64 ounces water daily.       10/28/2023    8:30 AM 10/20/2023    9:14 AM 08/02/2023    9:13 AM  Weight /BMI  Weight 271 lb 2 oz 272 lb 9.6 oz 273 lb 1.3 oz  Height 5' 8 (1.727 m) 5' 8 (1.727 m) 5' 8 (1.727 m)  BMI 41.22 kg/m2 41.45 kg/m2 41.52 kg/m2    Unchanged

## 2023-12-06 NOTE — Assessment & Plan Note (Signed)
 Controlled, no change in medication Updated lab needed at/ before next visit.

## 2023-12-06 NOTE — Assessment & Plan Note (Signed)
 Start buspar 3 times daily, no interest in therapy, a lot of housing/ financial need

## 2023-12-11 ENCOUNTER — Other Ambulatory Visit: Payer: Self-pay | Admitting: Family Medicine

## 2023-12-14 ENCOUNTER — Other Ambulatory Visit: Payer: Self-pay | Admitting: Family Medicine

## 2023-12-17 ENCOUNTER — Other Ambulatory Visit: Payer: Self-pay | Admitting: Family Medicine

## 2023-12-26 DIAGNOSIS — G5711 Meralgia paresthetica, right lower limb: Secondary | ICD-10-CM | POA: Diagnosis not present

## 2023-12-26 DIAGNOSIS — M47816 Spondylosis without myelopathy or radiculopathy, lumbar region: Secondary | ICD-10-CM | POA: Diagnosis not present

## 2023-12-26 DIAGNOSIS — M47812 Spondylosis without myelopathy or radiculopathy, cervical region: Secondary | ICD-10-CM | POA: Diagnosis not present

## 2023-12-26 DIAGNOSIS — M5416 Radiculopathy, lumbar region: Secondary | ICD-10-CM | POA: Diagnosis not present

## 2024-01-11 ENCOUNTER — Other Ambulatory Visit: Payer: Self-pay | Admitting: Family Medicine

## 2024-01-28 ENCOUNTER — Other Ambulatory Visit: Payer: Self-pay | Admitting: Family Medicine

## 2024-01-28 DIAGNOSIS — E1165 Type 2 diabetes mellitus with hyperglycemia: Secondary | ICD-10-CM

## 2024-02-07 ENCOUNTER — Other Ambulatory Visit: Payer: Self-pay | Admitting: Family Medicine

## 2024-02-17 ENCOUNTER — Other Ambulatory Visit: Payer: Self-pay | Admitting: Family Medicine

## 2024-02-21 ENCOUNTER — Ambulatory Visit: Payer: Medicare Other

## 2024-03-07 ENCOUNTER — Encounter: Admitting: Family Medicine

## 2024-03-28 ENCOUNTER — Encounter: Payer: Self-pay | Admitting: Family Medicine

## 2024-04-13 ENCOUNTER — Ambulatory Visit
